# Patient Record
Sex: Female | Born: 1960
Health system: Southern US, Community
[De-identification: ages and names within clinical notes are randomized; demographics above are authoritative.]

## PROBLEM LIST (undated history)

## (undated) DIAGNOSIS — J309 Allergic rhinitis, unspecified: Secondary | ICD-10-CM

## (undated) DIAGNOSIS — L309 Dermatitis, unspecified: Secondary | ICD-10-CM

## (undated) DIAGNOSIS — K5900 Constipation, unspecified: Secondary | ICD-10-CM

## (undated) DIAGNOSIS — K829 Disease of gallbladder, unspecified: Secondary | ICD-10-CM

## (undated) DIAGNOSIS — M2550 Pain in unspecified joint: Secondary | ICD-10-CM

## (undated) DIAGNOSIS — R519 Headache, unspecified: Secondary | ICD-10-CM

## (undated) DIAGNOSIS — I10 Essential (primary) hypertension: Secondary | ICD-10-CM

## (undated) DIAGNOSIS — Z91018 Allergy to other foods: Secondary | ICD-10-CM

## (undated) DIAGNOSIS — E559 Vitamin D deficiency, unspecified: Secondary | ICD-10-CM

## (undated) DIAGNOSIS — R51 Headache: Secondary | ICD-10-CM

## (undated) HISTORY — DX: Pain in unspecified joint: M25.50

## (undated) HISTORY — DX: Disease of gallbladder, unspecified: K82.9

## (undated) HISTORY — DX: Vitamin D deficiency, unspecified: E55.9

## (undated) HISTORY — DX: Essential (primary) hypertension: I10

## (undated) HISTORY — DX: Headache, unspecified: R51.9

## (undated) HISTORY — DX: Allergic rhinitis, unspecified: J30.9

## (undated) HISTORY — DX: Dermatitis, unspecified: L30.9

## (undated) HISTORY — PX: BRAIN SURGERY: SHX531

## (undated) HISTORY — DX: Headache: R51

## (undated) HISTORY — DX: Allergy to other foods: Z91.018

## (undated) HISTORY — DX: Constipation, unspecified: K59.00

---

## 2003-12-17 ENCOUNTER — Other Ambulatory Visit: Admission: RE | Admit: 2003-12-17 | Discharge: 2003-12-17 | Payer: Self-pay | Admitting: Family Medicine

## 2004-02-11 ENCOUNTER — Emergency Department (HOSPITAL_COMMUNITY): Admission: EM | Admit: 2004-02-11 | Discharge: 2004-02-11 | Payer: Self-pay | Admitting: Emergency Medicine

## 2004-12-07 ENCOUNTER — Ambulatory Visit: Payer: Self-pay | Admitting: Family Medicine

## 2006-04-22 ENCOUNTER — Ambulatory Visit: Payer: Self-pay | Admitting: Family Medicine

## 2006-08-29 DIAGNOSIS — J45998 Other asthma: Secondary | ICD-10-CM | POA: Insufficient documentation

## 2006-08-29 DIAGNOSIS — L259 Unspecified contact dermatitis, unspecified cause: Secondary | ICD-10-CM | POA: Insufficient documentation

## 2006-09-02 ENCOUNTER — Ambulatory Visit: Payer: Self-pay | Admitting: Family Medicine

## 2006-09-02 DIAGNOSIS — M25519 Pain in unspecified shoulder: Secondary | ICD-10-CM | POA: Insufficient documentation

## 2006-09-02 DIAGNOSIS — M25539 Pain in unspecified wrist: Secondary | ICD-10-CM | POA: Insufficient documentation

## 2006-11-18 ENCOUNTER — Telehealth (INDEPENDENT_AMBULATORY_CARE_PROVIDER_SITE_OTHER): Payer: Self-pay | Admitting: *Deleted

## 2006-11-25 ENCOUNTER — Encounter: Admission: RE | Admit: 2006-11-25 | Discharge: 2006-11-25 | Payer: Self-pay | Admitting: Obstetrics and Gynecology

## 2007-12-26 ENCOUNTER — Telehealth: Payer: Self-pay | Admitting: Internal Medicine

## 2007-12-27 ENCOUNTER — Ambulatory Visit: Payer: Self-pay | Admitting: Family Medicine

## 2007-12-27 DIAGNOSIS — J301 Allergic rhinitis due to pollen: Secondary | ICD-10-CM | POA: Insufficient documentation

## 2008-01-01 ENCOUNTER — Telehealth: Payer: Self-pay | Admitting: Family Medicine

## 2008-01-01 LAB — CONVERTED CEMR LAB: IgE (Immunoglobulin E), Serum: 892.5 intl units/mL — ABNORMAL HIGH (ref 0.0–180.0)

## 2008-01-02 ENCOUNTER — Telehealth (INDEPENDENT_AMBULATORY_CARE_PROVIDER_SITE_OTHER): Payer: Self-pay | Admitting: *Deleted

## 2008-01-11 ENCOUNTER — Ambulatory Visit: Payer: Self-pay | Admitting: Family Medicine

## 2008-01-26 ENCOUNTER — Encounter (INDEPENDENT_AMBULATORY_CARE_PROVIDER_SITE_OTHER): Payer: Self-pay | Admitting: *Deleted

## 2008-03-18 ENCOUNTER — Ambulatory Visit: Payer: Self-pay | Admitting: Internal Medicine

## 2008-03-18 LAB — CONVERTED CEMR LAB
Basophils Absolute: 0.1 10*3/uL (ref 0.0–0.1)
Basophils Relative: 1.6 % (ref 0.0–3.0)
Eosinophils Absolute: 0.2 10*3/uL (ref 0.0–0.7)
Eosinophils Relative: 3.8 % (ref 0.0–5.0)
HCT: 39.6 % (ref 36.0–46.0)
Hemoglobin: 13.4 g/dL (ref 12.0–15.0)
Lymphocytes Relative: 22.3 % (ref 12.0–46.0)
MCHC: 33.8 g/dL (ref 30.0–36.0)
MCV: 86.9 fL (ref 78.0–100.0)
Monocytes Absolute: 0.5 10*3/uL (ref 0.1–1.0)
Monocytes Relative: 9.5 % (ref 3.0–12.0)
Neutro Abs: 3 10*3/uL (ref 1.4–7.7)
Neutrophils Relative %: 62.8 % (ref 43.0–77.0)
Platelets: 185 10*3/uL (ref 150–400)
RBC: 4.55 M/uL (ref 3.87–5.11)
RDW: 12.7 % (ref 11.5–14.6)
WBC: 4.9 10*3/uL (ref 4.5–10.5)

## 2008-03-20 LAB — CONVERTED CEMR LAB: IgE (Immunoglobulin E), Serum: 1045 intl units/mL — ABNORMAL HIGH (ref 0.0–180.0)

## 2008-04-22 ENCOUNTER — Ambulatory Visit: Payer: Self-pay | Admitting: Family Medicine

## 2008-04-22 DIAGNOSIS — L03317 Cellulitis of buttock: Secondary | ICD-10-CM | POA: Insufficient documentation

## 2008-04-22 DIAGNOSIS — L0231 Cutaneous abscess of buttock: Secondary | ICD-10-CM | POA: Insufficient documentation

## 2008-04-26 ENCOUNTER — Telehealth (INDEPENDENT_AMBULATORY_CARE_PROVIDER_SITE_OTHER): Payer: Self-pay | Admitting: *Deleted

## 2009-02-10 ENCOUNTER — Telehealth: Payer: Self-pay | Admitting: Family Medicine

## 2009-03-19 ENCOUNTER — Ambulatory Visit: Payer: Self-pay | Admitting: Family Medicine

## 2009-04-30 ENCOUNTER — Ambulatory Visit: Payer: Self-pay | Admitting: Family Medicine

## 2009-04-30 DIAGNOSIS — N951 Menopausal and female climacteric states: Secondary | ICD-10-CM | POA: Insufficient documentation

## 2009-04-30 DIAGNOSIS — J019 Acute sinusitis, unspecified: Secondary | ICD-10-CM | POA: Insufficient documentation

## 2009-04-30 LAB — CONVERTED CEMR LAB
Cholesterol, target level: 200 mg/dL
HDL goal, serum: 40 mg/dL
Inflenza A Ag: NEGATIVE
Influenza B Ag: NEGATIVE
LDL Goal: 160 mg/dL

## 2009-05-01 ENCOUNTER — Telehealth: Payer: Self-pay | Admitting: Family Medicine

## 2009-06-27 ENCOUNTER — Ambulatory Visit: Payer: Self-pay | Admitting: Family Medicine

## 2009-06-27 DIAGNOSIS — M549 Dorsalgia, unspecified: Secondary | ICD-10-CM | POA: Insufficient documentation

## 2009-08-11 ENCOUNTER — Ambulatory Visit: Payer: Self-pay | Admitting: Family Medicine

## 2010-04-29 NOTE — Assessment & Plan Note (Signed)
Summary: ? flu symptoms//fd   Vital Signs:  Patient profile:   50 year old female Weight:      206 pounds Temp:     99.1 degrees F oral Pulse rate:   88 / minute Pulse rhythm:   regular BP sitting:   128 / 80  (left arm) Cuff size:   large  Vitals Entered By: Army Fossa CMA (April 30, 2009 11:02 AM) CC: Pt states since saturday, no appetite, vomitted on sunday, congestion., Cough, Lipid Management   History of Present Illness:  Cough      This is a 50 year old woman who presents with Cough.  The symptoms began 5 days ago.  The patient reports productive cough and fever, but denies non-productive cough, pleuritic chest pain, shortness of breath, wheezing, exertional dyspnea, hemoptysis, and malaise.  Associated symtpoms include sore throat and nasal congestion.  The patient denies the following symptoms: cold/URI symptoms, chronic rhinitis, weight loss, acid reflux symptoms, and peripheral edema.  The cough is worse with lying down.  Ineffective prior treatments have included OTC cough medication.  Pt took otc theraflu with some relief.     Lipid Management History:      Negative NCEP/ATP III risk factors include female age less than 48 years old and non-tobacco-user status.    Current Medications (verified): 1)  Singulair 10 Mg Tabs (Montelukast Sodium) .... Take 1 Tablet By Mouth At Bedtime 2)  Elidel 1 % Crea (Pimecrolimus) .... Apply A Small Amount To Affected Area Twice A Day 3)  Proair Hfa 108 (90 Base) Mcg/act Aers (Albuterol Sulfate) .... Inhale 2 Puff As Directed Four Times A Day-Needs To Schedule Office Visit 4)  Dulera 100-5 Mcg/act Aero (Mometasone Furo-Formoterol Fum) .... 2 Puffs Two Times A Day 5)  Desowen Lot W/cetaphil Cream 0.05 % Kit (Desonide Lot-Moisturizing Crea) .... Apply To Affected Area Once Daily 6)  Augmentin 875-125 Mg Tabs (Amoxicillin-Pot Clavulanate) .Marland Kitchen.. 1 By Mouth Two Times A Day 7)  Cheratussin Ac 100-10 Mg/11ml Syrp (Guaifenesin-Codeine) .Marland Kitchen..  1-2 Tsp By Mouth At Bedtime  Allergies (verified): No Known Drug Allergies  Past History:  Past medical, surgical, family and social histories (including risk factors) reviewed for relevance to current acute and chronic problems.  Past Medical History: Reviewed history from 03/18/2008 and no changes required. Asthma Eczema Allergic rhinitis  Past Surgical History: Reviewed history from 03/18/2008 and no changes required. No ENT C-section  Family History: Reviewed history from 03/18/2008 and no changes required. Family History of Asthma, eczema, allergy  Social History: Reviewed history from 08/29/2006 and no changes required. Married Never Smoked Alcohol use-no  Review of Systems      See HPI  Physical Exam  General:  Well-developed,well-nourished,in no acute distress; alert,appropriate and cooperative throughout examination Nose:  no external deformity and L frontal sinus tenderness.   Mouth:  Oral mucosa and oropharynx without lesions or exudates.  Teeth in good repair. Neck:  cervical lymphadenopathy.   Lungs:  Normal respiratory effort, chest expands symmetrically. Lungs are clear to auscultation, no crackles or wheezes. Heart:  normal rate, regular rhythm, and no murmur.   Psych:  Oriented X3 and normally interactive.     Impression & Recommendations:  Problem # 1:  SINUSITIS - ACUTE-NOS (ICD-461.9)  Her updated medication list for this problem includes:    Augmentin 875-125 Mg Tabs (Amoxicillin-pot clavulanate) .Marland Kitchen... 1 by mouth two times a day    Cheratussin Ac 100-10 Mg/51ml Syrp (Guaifenesin-codeine) .Marland Kitchen... 1-2 tsp by mouth  at bedtime  Instructed on treatment. Call if symptoms persist or worsen.   Orders: Flu A+B (11914)  Problem # 2:  HOT FLASHES (ICD-627.2)  maybe secondary to #1 since it has been occuring same amount of time as above symptoms consider gyn ---pt wanted to but then decided to wait  Orders: Flu A+B (78295)  Complete Medication  List: 1)  Singulair 10 Mg Tabs (Montelukast sodium) .... Take 1 tablet by mouth at bedtime 2)  Elidel 1 % Crea (Pimecrolimus) .... Apply a small amount to affected area twice a day 3)  Proair Hfa 108 (90 Base) Mcg/act Aers (Albuterol sulfate) .... Inhale 2 puff as directed four times a day-needs to schedule office visit 4)  Dulera 100-5 Mcg/act Aero (Mometasone furo-formoterol fum) .... 2 puffs two times a day 5)  Desowen Lot W/cetaphil Cream 0.05 % Kit (Desonide lot-moisturizing crea) .... Apply to affected area once daily 6)  Augmentin 875-125 Mg Tabs (Amoxicillin-pot clavulanate) .Marland Kitchen.. 1 by mouth two times a day 7)  Cheratussin Ac 100-10 Mg/53ml Syrp (Guaifenesin-codeine) .Marland Kitchen.. 1-2 tsp by mouth at bedtime  Lipid Assessment/Plan:      Based on NCEP/ATP III, the patient's risk factor category is "0-1 risk factors".  The patient's lipid goals are as follows: Total cholesterol goal is 200; LDL cholesterol goal is 160; HDL cholesterol goal is 40; Triglyceride goal is 150.    Prescriptions: CHERATUSSIN AC 100-10 MG/5ML SYRP (GUAIFENESIN-CODEINE) 1-2 tsp by mouth at bedtime  #6 oz x 0   Entered and Authorized by:   Loreen Freud DO   Signed by:   Loreen Freud DO on 04/30/2009   Method used:   Print then Give to Patient   RxID:   [id number] AUGMENTIN 875-125 MG TABS (AMOXICILLIN-POT CLAVULANATE) 1 by mouth two times a day  #20 x 0   Entered and Authorized by:   Loreen Freud DO   Signed by:   Loreen Freud DO on 04/30/2009   Method used:   Electronically to        [facility] #[id number]* (retail)       [address]       [place], [place]  [address]       Ph: [phone]       Fax: [phone]   RxID:   [id number]   Laboratory Results    Other Tests  Influenza A: negative Influenza B: negative

## 2010-04-29 NOTE — Assessment & Plan Note (Signed)
Summary: pain in right hip, painful to walk//lch   Vital Signs:  Patient profile:   50 year old female Weight:      216 pounds Pulse rate:   90 / minute Pulse rhythm:   regular BP sitting:   122 / 80  (left arm) Cuff size:   large  Vitals Entered By: Army Fossa CMA (June 27, 2009 1:28 PM) CC: Pt here for a pain in her right hip that goes down to her ankle. Started monday., Back Pain   History of Present Illness:       This is a 50 year old woman who presents with Back Pain.  The symptoms began 1 week ago.  The patient denies fever, chills, weakness, loss of sensation, fecal incontinence, urinary incontinence, urinary retention, dysuria, rest pain, inability to work, and inability to care for self.  The pain is located in the right low back.  The pain began at home, gradually, and after overuse.  The pain radiates to the right leg below the knee.  The pain is made worse by standing or walking.  The pain is made better by inactivity.    Current Medications (verified): 1)  Singulair 10 Mg Tabs (Montelukast Sodium) .... Take 1 Tablet By Mouth At Bedtime 2)  Elidel 1 % Crea (Pimecrolimus) .... Apply A Small Amount To Affected Area Twice A Day 3)  Proair Hfa 108 (90 Base) Mcg/act Aers (Albuterol Sulfate) .... Inhale 2 Puff As Directed Four Times A Day-Needs To Schedule Office Visit 4)  Dulera 100-5 Mcg/act Aero (Mometasone Furo-Formoterol Fum) .... 2 Puffs Two Times A Day 5)  Desowen Lot W/cetaphil Cream 0.05 % Kit (Desonide Lot-Moisturizing Crea) .... Apply To Affected Area Once Daily 6)  Cheratussin Ac 100-10 Mg/20ml Syrp (Guaifenesin-Codeine) .Marland Kitchen.. 1-2 Tsp By Mouth At Bedtime 7)  Flexeril 10 Mg Tabs (Cyclobenzaprine Hcl) .Marland Kitchen.. 1 By Mouth Three Times A Day 8)  Mobic 15 Mg Tabs (Meloxicam) .... 1/2 - 1 By Mouth Once Daily As Needed  Allergies (verified): No Known Drug Allergies  Past History:  Past medical, surgical, family and social histories (including risk factors) reviewed for  relevance to current acute and chronic problems.  Past Medical History: Reviewed history from 03/18/2008 and no changes required. Asthma Eczema Allergic rhinitis  Past Surgical History: Reviewed history from 03/18/2008 and no changes required. No ENT C-section  Family History: Reviewed history from 03/18/2008 and no changes required. Family History of Asthma, eczema, allergy  Social History: Reviewed history from 08/29/2006 and no changes required. Married Never Smoked Alcohol use-no  Review of Systems      See HPI  Physical Exam  General:  Well-developed,well-nourished,in no acute distress; alert,appropriate and cooperative throughout examination Msk:  normal ROM and no redness over joints.   Extremities:  No clubbing, cyanosis, edema, or deformity noted with normal full range of motion of all joints.   Neurologic:  strength normal in all extremities, gait normal, and DTRs symmetrical and normal.   Psych:  Oriented X3 and normally interactive.     Impression & Recommendations:  Problem # 1:  BACK PAIN (ICD-724.5)  Her updated medication list for this problem includes:    Flexeril 10 Mg Tabs (Cyclobenzaprine hcl) .Marland Kitchen... 1 by mouth three times a day    Mobic 15 Mg Tabs (Meloxicam) .Marland Kitchen... 1/2 - 1 by mouth once daily as needed  Discussed use of moist heat or ice, modified activities, medications, and stretching/strengthening exercises. Back care instructions given. To be seen  in 2 weeks if no improvement; sooner if worsening of symptoms.   Complete Medication List: 1)  Singulair 10 Mg Tabs (Montelukast sodium) .... Take 1 tablet by mouth at bedtime 2)  Elidel 1 % Crea (Pimecrolimus) .... Apply a small amount to affected area twice a day 3)  Proair Hfa 108 (90 Base) Mcg/act Aers (Albuterol sulfate) .... Inhale 2 puff as directed four times a day-needs to schedule office visit 4)  Dulera 100-5 Mcg/act Aero (Mometasone furo-formoterol fum) .... 2 puffs two times a day 5)   Desowen Lot W/cetaphil Cream 0.05 % Kit (Desonide lot-moisturizing crea) .... Apply to affected area once daily 6)  Cheratussin Ac 100-10 Mg/28ml Syrp (Guaifenesin-codeine) .Marland Kitchen.. 1-2 tsp by mouth at bedtime 7)  Flexeril 10 Mg Tabs (Cyclobenzaprine hcl) .Marland Kitchen.. 1 by mouth three times a day 8)  Mobic 15 Mg Tabs (Meloxicam) .... 1/2 - 1 by mouth once daily as needed  Patient Instructions: 1)  rto 1-2 weeks if no better or sooner if worsens Prescriptions: DULERA 100-5 MCG/ACT AERO (MOMETASONE FURO-FORMOTEROL FUM) 2 puffs two times a day  #1 x 5   Entered and Authorized by:   Loreen Freud DO   Signed by:   Loreen Freud DO on 06/27/2009   Method used:   Electronically to        [facility] #[id number]* (retail)       [address]       [place], [place]  [address]       Ph: [phone]       Fax: [phone]   RxID:   [id number] MOBIC 15 MG TABS (MELOXICAM) 1/2 - 1 by mouth once daily as needed  #30 x 1   Entered and Authorized by:   Loreen Freud DO   Signed by:   Loreen Freud DO on 06/27/2009   Method used:   Electronically to        [facility] #[id number]* (retail)       [address]       [place], [place]  [address]       Ph: [phone]       Fax: [phone]   RxID:   [id number] FLEXERIL 10 MG TABS (CYCLOBENZAPRINE HCL) 1 by mouth three times a day  #30 x 0   Entered and Authorized by:   Loreen Freud DO   Signed by:   Loreen Freud DO on 06/27/2009   Method used:   Electronically to        [facility] #[id number]* (retail)       [address]       [place], [place]  [address]       Ph: [phone]       Fax: [phone]   RxID:   [id number]

## 2010-04-29 NOTE — Progress Notes (Signed)
Summary: refill  Phone Note Refill Request   Refills Requested: Medication #1:  DULERA 100-5 MCG/ACT AERO 2 puffs two times a day Pt stated that you told her you were going to send in to the pharmacy... is this fine to do? Army Fossa CMA  May 01, 2009 11:46 AM It's never been filled but it was entered by you in in december.    Follow-up for Phone Call        yes Follow-up by: Loreen Freud DO,  May 01, 2009 12:27 PM    Prescriptions: DULERA 100-5 MCG/ACT AERO (MOMETASONE FURO-FORMOTEROL FUM) 2 puffs two times a day  #1 x 0   Entered by:   Army Fossa CMA   Authorized by:   Loreen Freud DO   Signed by:   Army Fossa CMA on 05/01/2009   Method used:   Electronically to        [facility] #[id number]* (retail)       [address]       [place], [place]  [address]       Ph: [phone]       Fax: [phone]   RxID:   [id number]

## 2010-04-29 NOTE — Assessment & Plan Note (Signed)
Summary: congestion//lch   Vital Signs:  Patient profile:   50 year old female Weight:      215 pounds Temp:     99.5 degrees F oral Pulse rate:   88 / minute Pulse rhythm:   regular BP sitting:   130 / 90  (left arm) Cuff size:   large  Vitals Entered By: Army Fossa CMA (Aug 11, 2009 10:31 AM) CC: Pt here for head and chest congestion since saturday night. Has taken Zyrtec., URI symptoms   History of Present Illness:       This is a 50 year old woman who presents with URI symptoms.  The symptoms began 4 days ago.  Pt is taking zyrtec regularly with no relief.  The patient complains of nasal congestion, purulent nasal discharge, productive cough, and sick contacts, but denies earache.  Associated symptoms include low-grade fever (<100.5 degrees) and wheezing.  The patient denies fever, fever of 100.5-103 degrees, fever of 103.1-104 degrees, fever to >104 degrees, stiff neck, dyspnea, rash, vomiting, diarrhea, use of an antipyretic, and response to antipyretic.  The patient also reports sneezing and headache.  The patient denies itchy watery eyes, itchy throat, seasonal symptoms, response to antihistamine, muscle aches, and severe fatigue.  The patient denies the following risk factors for Strep sinusitis: unilateral facial pain, unilateral nasal discharge, poor response to decongestant, double sickening, tooth pain, Strep exposure, tender adenopathy, and absence of cough.    Current Medications (verified): 1)  Singulair 10 Mg Tabs (Montelukast Sodium) .... Take 1 Tablet By Mouth At Bedtime 2)  Elidel 1 % Crea (Pimecrolimus) .... Apply A Small Amount To Affected Area Twice A Day 3)  Proair Hfa 108 (90 Base) Mcg/act Aers (Albuterol Sulfate) .... Inhale 2 Puff As Directed Four Times A Day-Needs To Schedule Office Visit 4)  Dulera 100-5 Mcg/act Aero (Mometasone Furo-Formoterol Fum) .... 2 Puffs Two Times A Day 5)  Desowen Lot W/cetaphil Cream 0.05 % Kit (Desonide Lot-Moisturizing Crea)  .... Apply To Affected Area Once Daily 6)  Prednisone 10 Mg Tabs (Prednisone) .... 3 By Mouth Once Daily For 3 Days Then 2 By Mouth Once Daily For 3 Days Then 1 By Mouth Once Daily For 3 Days 7)  Augmentin 875-125 Mg Tabs (Amoxicillin-Pot Clavulanate) .Marland Kitchen.. 1 By Mouth Two Times A Day 8)  Fluconazole 150 Mg Tabs (Fluconazole) .Marland Kitchen.. 1 By Mouth Once Daily X1.  May Repeat in 3 Days As Needed  Allergies (verified): No Known Drug Allergies  Past History:  Past medical, surgical, family and social histories (including risk factors) reviewed for relevance to current acute and chronic problems.  Past Medical History: Reviewed history from 03/18/2008 and no changes required. Asthma Eczema Allergic rhinitis  Past Surgical History: Reviewed history from 03/18/2008 and no changes required. No ENT C-section  Family History: Reviewed history from 03/18/2008 and no changes required. Family History of Asthma, eczema, allergy  Social History: Reviewed history from 08/29/2006 and no changes required. Married Never Smoked Alcohol use-no  Review of Systems      See HPI  Physical Exam  General:  Well-developed,well-nourished,in no acute distress; alert,appropriate and cooperative throughout examination Ears:  External ear exam shows no significant lesions or deformities.  Otoscopic examination reveals clear canals, tympanic membranes are intact bilaterally without bulging, retraction, inflammation or discharge. Hearing is grossly normal bilaterally. Nose:  L frontal sinus tenderness, L maxillary sinus tenderness, R frontal sinus tenderness, and R maxillary sinus tenderness.   Mouth:  Oral mucosa and oropharynx without  lesions or exudates.  Teeth in good repair. Neck:  No deformities, masses, or tenderness noted. Lungs:  R decreased breath sounds and L decreased breath sounds.   Heart:  normal rate and no murmur.   Cervical Nodes:  No lymphadenopathy noted Psych:  Cognition and judgment appear  intact. Alert and cooperative with normal attention span and concentration. No apparent delusions, illusions, hallucinations   Impression & Recommendations:  Problem # 1:  SINUSITIS - ACUTE-NOS (ICD-461.9)  The following medications were removed from the medication list:    Cheratussin Ac 100-10 Mg/73ml Syrp (Guaifenesin-codeine) .Marland Kitchen... 1-2 tsp by mouth at bedtime Her updated medication list for this problem includes:    Augmentin 875-125 Mg Tabs (Amoxicillin-pot clavulanate) .Marland Kitchen... 1 by mouth two times a day  Instructed on treatment. Call if symptoms persist or worsen.   Problem # 2:  BRONCHITIS- ACUTE (ICD-466.0)  The following medications were removed from the medication list:    Cheratussin Ac 100-10 Mg/62ml Syrp (Guaifenesin-codeine) .Marland Kitchen... 1-2 tsp by mouth at bedtime Her updated medication list for this problem includes:    Singulair 10 Mg Tabs (Montelukast sodium) .Marland Kitchen... Take 1 tablet by mouth at bedtime    Proair Hfa 108 (90 Base) Mcg/act Aers (Albuterol sulfate) ..... Inhale 2 puff as directed four times a day-needs to schedule office visit    Dulera 100-5 Mcg/act Aero (Mometasone furo-formoterol fum) .Marland Kitchen... 2 puffs two times a day    Augmentin 875-125 Mg Tabs (Amoxicillin-pot clavulanate) .Marland Kitchen... 1 by mouth two times a day  Take antibiotics and other medications as directed. Encouraged to push clear liquids, get enough rest, and take acetaminophen as needed. To be seen in 5-7 days if no improvement, sooner if worse.  Orders: Depo- Medrol 80mg  (J1040) Admin of Therapeutic Inj  intramuscular or subcutaneous ([address])  Complete Medication List: 1)  Singulair 10 Mg Tabs (Montelukast sodium) .... Take 1 tablet by mouth at bedtime 2)  Elidel 1 % Crea (Pimecrolimus) .... Apply a small amount to affected area twice a day 3)  Proair Hfa 108 (90 Base) Mcg/act Aers (Albuterol sulfate) .... Inhale 2 puff as directed four times a day-needs to schedule office visit 4)  Dulera 100-5 Mcg/act Aero  (Mometasone furo-formoterol fum) .... 2 puffs two times a day 5)  Desowen Lot W/cetaphil Cream 0.05 % Kit (Desonide lot-moisturizing crea) .... Apply to affected area once daily 6)  Prednisone 10 Mg Tabs (Prednisone) .... 3 by mouth once daily for 3 days then 2 by mouth once daily for 3 days then 1 by mouth once daily for 3 days 7)  Augmentin 875-125 Mg Tabs (Amoxicillin-pot clavulanate) .Marland Kitchen.. 1 by mouth two times a day 8)  Fluconazole 150 Mg Tabs (Fluconazole) .Marland Kitchen.. 1 by mouth once daily x1.  may repeat in 3 days as needed Prescriptions: FLUCONAZOLE 150 MG TABS (FLUCONAZOLE) 1 by mouth once daily x1.  May repeat in 3 days as needed  #2 x 2   Entered and Authorized by:   Loreen Freud DO   Signed by:   Loreen Freud DO on 08/11/2009   Method used:   Electronically to        [facility] #[id number]* (retail)       [address]       [place], [place]  [address]       Ph: [phone]       Fax: [phone]   RxID:   [id number] AUGMENTIN [id number]  MG TABS (AMOXICILLIN-POT CLAVULANATE) 1 by mouth two times a day  #20 x 0   Entered and Authorized by:   Loreen Freud DO   Signed by:   Loreen Freud DO on 08/11/2009   Method used:   Electronically to        [facility] [phone]* (retail)       [address]       [place], [place]  [address]       Ph: [phone]       Fax: [phone]   RxID:   [id number] PREDNISONE 10 MG TABS (PREDNISONE) 3 by mouth once daily for 3 days then 2 by mouth once daily for 3 days then 1 by mouth once daily for 3 days  #18 x 0   Entered and Authorized by:   Loreen Freud DO   Signed by:   Loreen Freud DO on 08/11/2009   Method used:   Electronically to        [facility] #[id number]* (retail)       [address]       [place], [place]  [address]       Ph: [phone]       Fax: [phone]   RxID:   [id number]    Medication  Administration  Injection # 1:    Medication: Depo- Medrol 80mg     Diagnosis: BRONCHITIS- ACUTE (ICD-466.0)    Route: IM    Site: LUOQ gluteus    Exp Date: 02/2010    Lot #: [id number]    Mfr: novaplus    Patient tolerated injection without complications    Given by: Army Fossa CMA (Aug 11, 2009 10:57 AM)  Orders Added: 1)  Est. Patient Level III [55732] 2)  Depo- Medrol 80mg  [J1040] 3)  Admin of Therapeutic Inj  intramuscular or subcutaneous [[address]254]

## 2010-05-25 ENCOUNTER — Telehealth: Payer: Self-pay | Admitting: Family Medicine

## 2010-06-02 ENCOUNTER — Telehealth: Payer: Self-pay | Admitting: Family Medicine

## 2010-06-04 NOTE — Progress Notes (Signed)
Summary: MD info needed  Phone Note Call from Patient Call back at Work Phone [phone]   Summary of Call: Patient left message on triage requesting the name/info of allergist Dr. Laury Axon referred her to.   I see that the patient was referred to Dr. Maple Hudson. I left message on vm notifying the patient and giving MD location and ph #. Lucious Groves CMA  May 25, 2010 12:25 PM

## 2010-06-09 NOTE — Progress Notes (Signed)
Summary: referral  Phone Note Refill Request   Refills Requested: Medication #1:  SINGULAIR 10 MG TABS Take 1 tablet by mouth at bedtime  Medication #2:  zyrtec Pt c/o headache,runny nose, sneezing and some head congestion. Pt states that med is not working for allergy. Pt would like to be referred to allergist. Pls advise..........Marland KitchenFelecia Deloach CMA  June 02, 2010 10:05 AM    Follow-up for Phone Call        referral put in Follow-up by: Loreen Freud DO,  June 02, 2010 12:00 PM  Additional Follow-up for Phone Call Additional follow up Details #1::        pt aware... Almeta Monas CMA Duncan Dull)  June 02, 2010 12:02 PM

## 2010-06-29 ENCOUNTER — Ambulatory Visit: Payer: Self-pay | Admitting: Family Medicine

## 2010-06-29 ENCOUNTER — Encounter: Payer: Self-pay | Admitting: Family

## 2010-06-29 ENCOUNTER — Ambulatory Visit (INDEPENDENT_AMBULATORY_CARE_PROVIDER_SITE_OTHER): Payer: Managed Care, Other (non HMO) | Admitting: Family

## 2010-06-29 DIAGNOSIS — J301 Allergic rhinitis due to pollen: Secondary | ICD-10-CM

## 2010-06-29 MED ORDER — FLUTICASONE FUROATE 27.5 MCG/SPRAY NA SUSP: 2.0000 | Freq: Every day | NASAL | Status: DC

## 2010-06-29 MED ORDER — MONTELUKAST SODIUM 10 MG PO TABS
10.0000 mg | ORAL_TABLET | Freq: Every day | ORAL | Status: DC
Start: 2010-06-29 — End: 2011-02-03

## 2010-06-29 NOTE — Patient Instructions (Addendum)
Please call if your symptoms worsen, or if they are not improved in 1 week.

## 2010-06-29 NOTE — Assessment & Plan Note (Signed)
50 year old female presents with symptoms of Allergic rhinitis and Asthma.  Will add Singulair to her regimen as well as a nasal steroid.  Cont. Antihistamine.

## 2010-06-29 NOTE — Progress Notes (Signed)
  Subjective:    Patient ID: Ruth [name], female    DOB: 11-05-1960, 50 y.o.   MRN: [id number]  HPI  Ms.  Ruth [name] is a 50 year old female who presents today with chief complaint of Nasal congestion, cough,  and phlegm (yellow).  Nasal congestion started Friday, cough started Saturday.  Clear nasal congestion.  + hx of asthma.  Yesterday felt some asthma symptoms.  Needed to use her rescue inhaler.  She is using zyrtec and Dulera currently.    Review of Systems  Constitutional: Negative for fever.  HENT:       Ears itch  Eyes:       Eyes are itching  Respiratory: Positive for wheezing.   Cardiovascular: Negative for chest pain and leg swelling.   No Known Allergies   BP 124/86  Pulse 84  Temp(Src) 98.5 F (36.9 C) (Oral)  Resp 16  Ht 5\' 5"  (1.651 m)  Wt 206 lb 1.3 oz (93.477 kg)  BMI 34.29 kg/m2        Objective:   Physical Exam  Constitutional: She appears well-developed and well-nourished.  HENT:  Left Ear: External ear normal.  Mouth/Throat: Oropharynx is clear and moist.       Ears: Bilateral serous effusion without significant erythema No maxillary or frontal sinus tenderness.  Eyes: Pupils are equal, round, and reactive to light.  Neck: Normal range of motion. Neck supple.  Cardiovascular: Normal rate.   Pulmonary/Chest: Breath sounds normal.  Lymphadenopathy:    She has no cervical adenopathy.          Assessment & Plan:

## 2010-07-01 ENCOUNTER — Telehealth: Payer: Self-pay | Admitting: *Deleted

## 2010-07-01 MED ORDER — BENZONATATE 100 MG PO CAPS: 100.0000 mg | ORAL_CAPSULE | Freq: Four times a day (QID) | ORAL | Status: DC | PRN

## 2010-07-01 MED ORDER — AMOXICILLIN 875 MG PO TABS: 875.0000 mg | ORAL_TABLET | Freq: Two times a day (BID) | ORAL | Status: AC

## 2010-07-01 NOTE — Telephone Encounter (Signed)
Rx sent to pharmacy for amoxicillin.  She should call if symptoms are not improved in the next 24 hours.  Also sent rx for tessalon for her cough.

## 2010-07-01 NOTE — Telephone Encounter (Signed)
Pt states cough is now dry but all persists all day. Still has other symptoms of head congestion. Was not able to use Veramyst yet due to problem at pharmacy. Has taken Singulair since Monday. Pt thinks the coughing is going to trigger an asthma attack and would like an antibiotic. Please advise.

## 2010-07-01 NOTE — Telephone Encounter (Signed)
Pt left voice message stating that she has been using Singulair and nasal spray but her cough has gotten much worse. Pt states she had to take an antibiotic the last time she felt this way. Pt states she has to report to work at 12pm.  Attempted to reach pt but received voicemail. Left message for pt to return my call. Please advise.

## 2010-07-01 NOTE — Telephone Encounter (Signed)
Left detailed message on pt's cell phone that rxs were sent to the pharmacy and to call if she has any questions.

## 2010-07-10 ENCOUNTER — Encounter: Payer: Self-pay | Admitting: Internal Medicine

## 2010-07-13 ENCOUNTER — Other Ambulatory Visit: Payer: Self-pay | Admitting: Internal Medicine

## 2010-07-13 ENCOUNTER — Other Ambulatory Visit: Payer: Managed Care, Other (non HMO)

## 2010-07-13 ENCOUNTER — Ambulatory Visit (INDEPENDENT_AMBULATORY_CARE_PROVIDER_SITE_OTHER): Payer: Managed Care, Other (non HMO) | Admitting: Internal Medicine

## 2010-07-13 ENCOUNTER — Encounter: Payer: Self-pay | Admitting: Internal Medicine

## 2010-07-13 VITALS — BP 122/80 | HR 92 | Ht 64.0 in | Wt 211.4 lb

## 2010-07-13 DIAGNOSIS — J301 Allergic rhinitis due to pollen: Secondary | ICD-10-CM

## 2010-07-13 DIAGNOSIS — J45909 Unspecified asthma, uncomplicated: Secondary | ICD-10-CM

## 2010-07-13 MED ORDER — FEXOFENADINE-PSEUDOEPHED ER 180-240 MG PO TB24: 1.0000 | ORAL_TABLET | Freq: Every day | ORAL | Status: DC

## 2010-07-13 NOTE — Patient Instructions (Signed)
Instead of zyrtec, ask at pharmacy counter for fexofenadine (generic Allegra)- D, 24 hour/ 180 mg. Try taking 1 daily. You can continue the Singulair and you Veramyst and Dulera.    Lab- Allergy profile dx Allergic rhinitis

## 2010-07-13 NOTE — Progress Notes (Signed)
  Subjective:    Patient ID: Ruth [name], female    DOB: 1960-09-18, 50 y.o.   MRN: [id number]  HPI 67 yoF last seen here 03/18/08 for allergy/ asthma consultation for Dr Laury Axon. Note is reviewed. Allergy vaccine helped 8 years ago in [place]. She has not been in control, with itching eyes, head and chest congested. Perennial symptoms. She flared 2 weeks ago without clear change in exposure. 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 She was given singulair, tessalon, Veramyst, augmentin, no prednisone.  Uses Dulera 100 regularly. Still itch and sneeze, itching eyes. Less cough.   Review of Systems  Constitutional: Negative for fever, chills, diaphoresis and fatigue.  HENT: Positive for congestion, rhinorrhea, sneezing and postnasal drip. Negative for ear pain and ear discharge.   Eyes: Positive for discharge and itching.  Respiratory: Positive for chest tightness and wheezing.   Neurological: Negative for headaches.   See HPI     Objective:   Physical Exam General- Alert, Oriented, Affect-appropriate, Distress- none acute  Skin- rash-none, lesions- none, excoriation- none  Lymphadenopathy- none  Head- atraumatic  Eyes- Gross vision intact, PERRLA, conjunctivae clear secretions  Ears- Hearing Normal canals, Tm L ,   R ,  Nose- A little hoarse and nasal. , Septal dev to left , No-  mucus, polyps, erosion, perforation   Throat- Mallampati II , mucosa clear , drainage- none, tonsils- atrophic  Neck- flexible , trachea midline, no stridor , thyroid nl, carotid no bruit  Chest - symmetrical excursion , unlabored     Heart/CV- RRR , no murmur , no gallop  , no rub, nl s1 s2                     - JVD- none , edema- none, stasis changes- none, varices- none     Lung- clear to P&A, wheeze- none, cough- none , dullness-none, rub- none     Chest wall-   Abd- tender-no, distended-no, bowel sounds-present, HSM- no  Br/ Gen/ Rectal- Not done,  not indicated  Extrem- cyanosis- none, clubbing, none, atrophy- none, strength- nl  Neuro- grossly intact to observation         Assessment & Plan:

## 2010-07-13 NOTE — Assessment & Plan Note (Signed)
She continues the script Veramyst, but still feels nasal with some post nasal drip. She doubts that zyrtec is doing much.  We will continue Veramyst and switch to allegra -d Discussed allergic conjunctivitis vs overdrying.

## 2010-07-13 NOTE — Assessment & Plan Note (Addendum)
Asthma for now seems ok with Dulera 10. On return we will look again at that, and consider a rescue inhaler.

## 2010-07-14 LAB — ALLERGY FULL PROFILE
Allergen, D pternoyssinus,d7: 0.94 kU/L — ABNORMAL HIGH (ref <0.35)
Allergen,Goose feathers, e70: <0.35 kU/L (ref <0.35)
Alternaria Alternata: 1.23 kU/L — ABNORMAL HIGH (ref <0.35)
Aspergillus fumigatus, IgG: 2.92 kU/L — ABNORMAL HIGH (ref <0.35)
Bahia Grass: 4.08 kU/L — ABNORMAL HIGH (ref <0.35)
Bermuda Grass: 2 kU/L — ABNORMAL HIGH (ref <0.35)
Box Elder IgE: 2.87 kU/L — ABNORMAL HIGH (ref <0.35)
Candida Albicans: 1.32 kU/L — ABNORMAL HIGH (ref <0.35)
Cat Dander: 33.8 kU/L — ABNORMAL HIGH (ref <0.35)
Common Ragweed: 2.88 kU/L — ABNORMAL HIGH (ref <0.35)
Curvularia lunata: 0.89 kU/L — ABNORMAL HIGH (ref <0.35)
D. farinae: 0.47 kU/L — ABNORMAL HIGH (ref <0.35)
Dog Dander: 41.5 kU/L — ABNORMAL HIGH (ref <0.35)
Elm IgE: 1.38 kU/L — ABNORMAL HIGH (ref <0.35)
Fescue: 6.02 kU/L — ABNORMAL HIGH (ref <0.35)
G005 Rye, Perennial: 3.29 kU/L — ABNORMAL HIGH (ref <0.35)
G009 Red Top: 6.57 kU/L — ABNORMAL HIGH (ref <0.35)
Goldenrod: 1.2 kU/L — ABNORMAL HIGH (ref <0.35)
Helminthosporium halodes: 2.68 kU/L — ABNORMAL HIGH (ref <0.35)
House Dust Hollister: 22.3 kU/L — ABNORMAL HIGH (ref <0.35)
IgE (Immunoglobulin E), Serum: 768.9 IU/mL — ABNORMAL HIGH (ref 0.0–180.0)
Lamb's Quarters: 1.14 kU/L — ABNORMAL HIGH (ref <0.35)
Oak: 32.6 kU/L — ABNORMAL HIGH (ref <0.35)
Plantain: 1.9 kU/L — ABNORMAL HIGH (ref <0.35)
Stemphylium Botryosum: 1.38 kU/L — ABNORMAL HIGH (ref <0.35)
Sycamore Tree: 1.16 kU/L — ABNORMAL HIGH (ref <0.35)
Timothy Grass: 3.37 kU/L — ABNORMAL HIGH (ref <0.35)

## 2010-07-18 ENCOUNTER — Encounter: Payer: Self-pay | Admitting: Internal Medicine

## 2010-08-11 ENCOUNTER — Ambulatory Visit: Payer: Managed Care, Other (non HMO) | Admitting: Internal Medicine

## 2010-08-31 NOTE — Progress Notes (Signed)
Quick Note:  Spoke with patient-aware of results. ______ 

## 2010-09-11 ENCOUNTER — Encounter: Payer: Self-pay | Admitting: Internal Medicine

## 2010-09-11 ENCOUNTER — Ambulatory Visit (INDEPENDENT_AMBULATORY_CARE_PROVIDER_SITE_OTHER): Payer: Managed Care, Other (non HMO) | Admitting: Internal Medicine

## 2010-09-11 VITALS — BP 122/78 | HR 97 | Ht 64.0 in | Wt 209.0 lb

## 2010-09-11 DIAGNOSIS — J301 Allergic rhinitis due to pollen: Secondary | ICD-10-CM

## 2010-09-11 MED ORDER — MOMETASONE FURO-FORMOTEROL FUM 100-5 MCG/ACT IN AERO: 2.0000 | INHALATION_SPRAY | Freq: Two times a day (BID) | RESPIRATORY_TRACT | Status: DC

## 2010-09-11 NOTE — Progress Notes (Signed)
Subjective:    Patient ID: Ruth [name], female    DOB: 1960/06/16, 50 y.o.   MRN: [id number]  HPI Subjective:    Patient ID: Ruth [name], female    DOB: 02/02/1961, 50 y.o.   MRN: [id number]  HPI 4/161/2- 89 yoF last seen here 03/18/08 for allergy/ asthma consultation for Dr Laury Axon. Note is reviewed. Allergy vaccine helped 8 years ago in [place]. She has not been in control, with itching eyes, head and chest congested. Perennial symptoms. She flared 2 weeks ago without clear change in exposure. 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 She was given singulair, tessalon, Veramyst, augmentin, no prednisone.  Uses Dulera 100 regularly. Still itch and sneeze, itching eyes. Less cough.  09/11/10-  Allergic conjunctivitis/ rhinitis, Asthma  Eyes always itch, still some hoarse, nasal drainage. Denies chest tightness or wheeze taking daily Dulera. Doing better now that Spring pollens have subsided. Taking Veramyst, no eye drops, taking Singulair- not helpful.  Allergy profile - 07/13/10- IgE 768.9, broadly positive Prednisone   Review of Systems  See HPI Constitutional: Negative for fever, chills, diaphoresis and fatigue.  HENT: Positive for congestion, rhinorrhea, sneezing and postnasal drip. Negative for ear pain and ear discharge.   Eyes: Positive for discharge and itching.  Respiratory: Positive for chest tightness and wheezing.   Neurological: Negative for headaches.    Objective:   Physical Exam General- Alert, Oriented, Affect-appropriate, Distress- none acute  Skin- rash-none, lesions- none, excoriation- none  Lymphadenopathy- none  Head- atraumatic  Eyes- Gross vision intact, PERRLA, conjunctivae clear secretions  Ears- Hearing Normal canals, Tm- normal  Nose- A little hoarse and nasal. , Septal dev to left , No-  mucus, polyps, erosion, perforation   Throat- Mallampati II , mucosa clear , drainage- none, tonsils-  atrophic  Neck- flexible , trachea midline, no stridor , thyroid nl, carotid no bruit  Chest - symmetrical excursion , unlabored     Heart/CV- RRR , no murmur , no gallop  , no rub, nl s1 s2                     - JVD- none , edema- none, stasis changes- none, varices- none     Lung- clear to P&A, wheeze- none, cough- none , dullness-none, rub- none     Chest wall-   Abd- tender-no, distended-no, bowel sounds-present, HSM- no  Br/ Gen/ Rectal- Not done, not indicated  Extrem- cyanosis- none, clubbing, none, atrophy- none, strength- nl  Neuro- grossly intact to observation         Assessment & Plan:     Review of Systems     Objective:   Physical Exam General- Alert, Oriented, Affect-appropriate, Distress- none acute  Skin- rash-none, lesions- none, excoriation- none  Lymphadenopathy- none  Head- atraumatic  Eyes- Gross vision intact, PERRLA, conjunctivae clear ecretions  Ears- Hearing, canals, Tm - normal  Nose- Clear, No- Septal dev, mucus, polyps, erosion, perforation   Throat- Mallampati II , mucosa clear , drainage- none, tonsils- atrophic   Minor hoarseness  Neck- flexible , trachea midline, no stridor , thyroid nl, carotid no bruit  Chest - symmetrical excursion , unlabored     Heart/CV- RRR , no murmur , no gallop  , no rub, nl s1 s2                     - JVD- none , edema- none, stasis changes-  none, varices- none     Lung- clear to P&A, wheeze- none, cough- none , dullness-none, rub- none     Chest wall-  Abd- tender-no, distended-no, bowel sounds-present, HSM- no  Br/ Gen/ Rectal- Not done, not indicated  Extrem- cyanosis- none, clubbing, none, atrophy- none, strength- nl  Neuro- grossly intact to observation         Assessment & Plan:

## 2010-09-11 NOTE — Assessment & Plan Note (Addendum)
Continues allergy control measures, already in place for her daughter. We compared Xolair to Allergy vaccine with potential risks, benefits, cost. Allergy vaccine worked well in past and asthma is currently controlled so we will start with allergy vaccine.

## 2010-09-11 NOTE — Patient Instructions (Signed)
Set up return for allergy skin testing-    It is important that you be off antihistamines including your Allegra, any otc cough, sleep or allergy meds, for 3 days before your testing. Dulera, singulair and Veramyst are ok.   Script for [facility] sent

## 2010-12-02 ENCOUNTER — Ambulatory Visit: Payer: Managed Care, Other (non HMO) | Admitting: Internal Medicine

## 2011-01-20 ENCOUNTER — Other Ambulatory Visit: Payer: Self-pay | Admitting: Family Medicine

## 2011-01-20 NOTE — Telephone Encounter (Signed)
Refill request--Last seen 08/11/09 ans filled 03/19/09. Please advise    KP

## 2011-01-21 ENCOUNTER — Ambulatory Visit (INDEPENDENT_AMBULATORY_CARE_PROVIDER_SITE_OTHER): Payer: Managed Care, Other (non HMO) | Admitting: Family Medicine

## 2011-01-21 ENCOUNTER — Encounter: Payer: Self-pay | Admitting: Family Medicine

## 2011-01-21 VITALS — BP 130/90 | HR 66 | Temp 98.6°F | Wt 208.0 lb

## 2011-01-21 DIAGNOSIS — J309 Allergic rhinitis, unspecified: Secondary | ICD-10-CM

## 2011-01-21 DIAGNOSIS — Z9109 Other allergy status, other than to drugs and biological substances: Secondary | ICD-10-CM

## 2011-01-21 MED ORDER — DESONIDE CREA-MOISTURIZING LOT 0.05 % EX KIT: 1.0000 "application " | PACK | Freq: Every day | CUTANEOUS | Status: DC

## 2011-01-21 MED ORDER — CICLESONIDE 37 MCG/ACT NA AERS: 1.0000 | INHALATION_SPRAY | Freq: Every day | NASAL | Status: DC

## 2011-01-21 MED ORDER — NAPHAZOLINE HCL 0.012 % OP SOLN: 1.0000 [drp] | Freq: Four times a day (QID) | OPHTHALMIC | Status: AC

## 2011-01-21 NOTE — Patient Instructions (Signed)
Allergies, Generic Allergies may happen from anything your body is sensitive to. This may be food, medicines, pollens, chemicals, and nearly anything around you in everyday life that produces allergens. An allergen is anything that causes an allergy producing substance. Heredity is often a factor in causing these problems. This means you may have some of the same allergies as your parents. Food allergies happen in all age groups. Food allergies are some of the most severe and life threatening. Some common food allergies are cow's milk, seafood, eggs, nuts, wheat, and soybeans. SYMPTOMS   Swelling around the mouth.   An itchy red rash or hives.   Vomiting or diarrhea.   Difficulty breathing.  SEVERE ALLERGIC REACTIONS ARE LIFE-THREATENING. This reaction is called anaphylaxis. It can cause the mouth and throat to swell and cause difficulty with breathing and swallowing. In severe reactions only a trace amount of food (for example, peanut oil in a salad) may cause death within seconds. Seasonal allergies occur in all age groups. These are seasonal because they usually occur during the same season every year. They may be a reaction to molds, grass pollens, or tree pollens. Other causes of problems are house dust mite allergens, pet dander, and mold spores. The symptoms often consist of nasal congestion, a runny itchy nose associated with sneezing, and tearing itchy eyes. There is often an associated itching of the mouth and ears. The problems happen when you come in contact with pollens and other allergens. Allergens are the particles in the air that the body reacts to with an allergic reaction. This causes you to release allergic antibodies. Through a chain of events, these eventually cause you to release histamine into the blood stream. Although it is meant to be protective to the body, it is this release that causes your discomfort. This is why you were given anti-histamines to feel better. If you are  unable to pinpoint the offending allergen, it may be determined by skin or blood testing. Allergies cannot be cured but can be controlled with medicine. Hay fever is a collection of all or some of the seasonal allergy problems. It may often be treated with simple over-the-counter medicine such as diphenhydramine. Take medicine as directed. Do not drink alcohol or drive while taking this medicine. Check with your caregiver or package insert for child dosages. If these medicines are not effective, there are many new medicines your caregiver can prescribe. Stronger medicine such as nasal spray, eye drops, and corticosteroids may be used if the first things you try do not work well. Other treatments such as immunotherapy or desensitizing injections can be used if all else fails. Follow up with your caregiver if problems continue. These seasonal allergies are usually not life threatening. They are generally more of a nuisance that can often be handled using medicine. HOME CARE INSTRUCTIONS   If unsure what causes a reaction, keep a diary of foods eaten and symptoms that follow. Avoid foods that cause reactions.   If hives or rash are present:   Take medicine as directed.   You may use an over-the-counter antihistamine (diphenhydramine) for hives and itching as needed.   Apply cold compresses (cloths) to the skin or take baths in cool water. Avoid hot baths or showers. Heat will make a rash and itching worse.   If you are severely allergic:   Following a treatment for a severe reaction, hospitalization is often required for closer follow-up.   Wear a medic-alert bracelet or necklace stating the allergy.     You and your family must learn how to give adrenaline or use an anaphylaxis kit.   If you have had a severe reaction, always carry your anaphylaxis kit or EpiPen with you. Use this medicine as directed by your caregiver if a severe reaction is occurring. Failure to do so could have a fatal  outcome.  SEEK MEDICAL CARE IF:  You suspect a food allergy. Symptoms generally happen within 30 minutes of eating a food.   Your symptoms have not gone away within 2 days or are getting worse.   You develop new symptoms.   You want to retest yourself or your child with a food or drink you think causes an allergic reaction. Never do this if an anaphylactic reaction to that food or drink has happened before. Only do this under the care of a caregiver.  SEEK IMMEDIATE MEDICAL CARE IF:   You have difficulty breathing, are wheezing, or have a tight feeling in your chest or throat.   You have a swollen mouth, or you have hives, swelling, or itching all over your body.   You have had a severe reaction that has responded to your anaphylaxis kit or an EpiPen. These reactions may return when the medicine has worn off. These reactions should be considered life threatening.  MAKE SURE YOU:   Understand these instructions.   Will watch your condition.   Will get help right away if you are not doing well or get worse.  Document Released: 06/08/2002 Document Revised: 11/25/2010 Document Reviewed: 11/13/2007 [employer] Patient Information 2012 [employer]. 

## 2011-01-21 NOTE — Progress Notes (Signed)
  Subjective:    Patient ID: Ruth [name], female    DOB: [birth year], 50 y.o.   MRN: [id number]  HPI Pt here c/o b/l itchy eyes and runny nose.  She is taking her meds.  Pt sees allergist and has appointment in the next couple of weeks.     Review of Systems As above    Objective:   Physical Exam  Constitutional: She is oriented to person, place, and time. She appears well-developed and well-nourished.  HENT:  Right Ear: External ear normal.  Left Ear: External ear normal.  Mouth/Throat: No oropharyngeal exudate.       + clear drainage from nose  Eyes:       Eyes tearing and injected b/l   Pulmonary/Chest: Effort normal and breath sounds normal.  Neurological: She is alert and oriented to person, place, and time.  Psychiatric: She has a normal mood and affect. Her behavior is normal. Judgment and thought content normal.          Assessment & Plan:  Allergies--- with allergic conjunctivitis                         zetonna nasal spray                          Naphcon eye drops

## 2011-02-03 ENCOUNTER — Ambulatory Visit (INDEPENDENT_AMBULATORY_CARE_PROVIDER_SITE_OTHER): Payer: Managed Care, Other (non HMO) | Admitting: Internal Medicine

## 2011-02-03 ENCOUNTER — Encounter: Payer: Self-pay | Admitting: Internal Medicine

## 2011-02-03 VITALS — BP 140/92 | HR 72 | Ht 64.0 in | Wt 210.6 lb

## 2011-02-03 DIAGNOSIS — J301 Allergic rhinitis due to pollen: Secondary | ICD-10-CM

## 2011-02-03 NOTE — Patient Instructions (Addendum)
After allergy test- given claritin, neb neo, depo 80 -----------------------------------------------------------  Ok to resume zyrtec  Sample Omnaris - 2 sprays each nostril, once daily at bedtime  Consider allergy vaccine- we can discuss it more next time  Sample Patanol eye drops- 1 drop each eye daily for allergy

## 2011-02-03 NOTE — Progress Notes (Signed)
   Patient ID: Ruth [name], female    DOB: September 06, 1960, 50 y.o.   MRN: [id number]  HPI 4/161/2- 24 yoF last seen here 03/18/08 for allergy/ asthma consultation for Dr Laury Axon. Note is reviewed. Allergy vaccine helped 8 years ago in [place]. She has not been in control, with itching eyes, head and chest congested. Perennial symptoms. She flared 2 weeks ago without clear change in exposure. 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 She was given singulair, tessalon, Veramyst, augmentin, no prednisone.  Uses Dulera 100 regularly. Still itch and sneeze, itching eyes. Less cough.  09/11/10-  Allergic conjunctivitis/ rhinitis, Asthma  Eyes always itch, still some hoarse, nasal drainage. Denies chest tightness or wheeze taking daily Dulera. Doing better now that Spring pollens have subsided. Taking Veramyst, no eye drops, taking Singulair- not helpful.  Allergy profile - 07/13/10- IgE 768.9, broadly positive Prednisone   02/03/11- 50 year old female never smoker followed for Allergic conjunctivitis/ rhinitis, Asthma  She comes today off of antihistamines for allergy skin testing. Describes it increased nasal congestion sneezing itching and burning of eyes since she has been unable to take her Zyrtec pending these tests. Naphcon did not help. Zetonna nasal spray burns too much.  Allergy Skin tests: Punctures only- significantly positive for grasses, trees and endodermals.    Review of Systems  See HPI Constitutional: Negative for fever, chills, diaphoresis and fatigue.  HENT: Positive for congestion, rhinorrhea, sneezing and postnasal drip. Negative for ear pain and ear discharge.   Eyes: Positive for discharge and itching.  Respiratory: Positive for chest tightness and wheezing.   Neurological: Negative for headaches.    Objective:  General- Alert, Oriented, Affect-appropriate, Distress- none acute Skin- rash-none, lesions- none, excoriation-  none Lymphadenopathy- none Head- atraumatic            Eyes- Gross vision intact, PERRLA, conjunctivae mildly injected            Ears- Hearing, canals-normal            Nose- Clear, no-Septal dev, mucus, polyps, erosion, perforation, +actively sneezing with obviously stuffy nose             Throat- Mallampati II , mucosa clear , drainage- none, tonsils- atrophic Neck-  Chest -            Heart/CV- RRR , no murmur , no gallop  , no rub, nl s1 s2                           - JVD- none , edema- none, stasis changes- none, varices- none           Lung- clear to P&A, wheeze- none, cough- none , dullness-none, rub- none           Chest wall-  Abd- Br/ Gen/ Rectal- Not done, not indicated Extrem-  Neuro- grossly intact to observation

## 2011-02-05 NOTE — Assessment & Plan Note (Addendum)
We discussed the significance of her positive skin test. Reactions were strong enough that we did not do intradermal testing. She was obviously symptomatic before we started today with sneezing and itching of eyes. She has not had adequate control with any of several different steroid nasal sprays and antihistamines. She had done better with allergy vaccine before moving to [facility] and we discussed that as an option. Plan: She is going to return to regular use of her Zyrtec antihistamine and I am giving a sample of Pataday eyedrops. We can watch her response to these, recognizing the winter season is coming with less troublesome exposure anticipated. We have discussed environmental precautions. After her testing, because of her persistent nasal symptoms, she was given a nasal nebulizer inhalation with Neo-Synephrine, and Depo-Medrol 80 mg IM with steroid talk.

## 2011-02-11 ENCOUNTER — Encounter: Payer: Self-pay | Admitting: Internal Medicine

## 2011-03-10 ENCOUNTER — Ambulatory Visit: Payer: Managed Care, Other (non HMO) | Admitting: Internal Medicine

## 2011-03-12 ENCOUNTER — Encounter: Payer: Self-pay | Admitting: Internal Medicine

## 2011-03-12 ENCOUNTER — Ambulatory Visit (INDEPENDENT_AMBULATORY_CARE_PROVIDER_SITE_OTHER): Payer: Managed Care, Other (non HMO) | Admitting: Internal Medicine

## 2011-03-12 VITALS — BP 110/82 | HR 87 | Ht 64.0 in | Wt 213.4 lb

## 2011-03-12 DIAGNOSIS — J45909 Unspecified asthma, uncomplicated: Secondary | ICD-10-CM

## 2011-03-12 DIAGNOSIS — J301 Allergic rhinitis due to pollen: Secondary | ICD-10-CM

## 2011-03-12 MED ORDER — AEROCHAMBER MV MISC: Status: AC

## 2011-03-12 NOTE — Progress Notes (Signed)
Patient ID: Ruth [name], female    DOB: 1960-05-26, 50 y.o.   MRN: [id number]  HPI 4/161/2- 58 yoF last seen here 03/18/08 for allergy/ asthma consultation for Dr Laury Axon. Note is reviewed. Allergy vaccine helped 8 years ago in [place]. She has not been in control, with itching eyes, head and chest congested. Perennial symptoms. She flared 2 weeks ago without clear change in exposure. 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 She was given singulair, tessalon, Veramyst, augmentin, no prednisone.  Uses Dulera 100 regularly. Still itch and sneeze, itching eyes. Less cough.  09/11/10-  Allergic conjunctivitis/ rhinitis, Asthma  Eyes always itch, still some hoarse, nasal drainage. Denies chest tightness or wheeze taking daily Dulera. Doing better now that Spring pollens have subsided. Taking Veramyst, no eye drops, taking Singulair- not helpful.  Allergy profile - 07/13/10- IgE 768.9, broadly positive Prednisone   03/12/11-50 year old female never smoker followed for Allergic conjunctivitis/ rhinitis, Asthma  Pataday works well for her allergic conjunctivitis. Zyrtec was not effective enough as an antihistamine. Complains of persistent raspiness in her throat, ears itch a lot and she sneezes. Dulera controls her wheezing but she asks if it is the cause of her hoarseness. Omnaris and Veramyst both caused retro-orbital headache.  Review of Systems  See HPI Constitutional:   No-   weight loss, night sweats, fevers, chills, fatigue, lassitude. HEENT:   No-  headaches, difficulty swallowing, tooth/dental problems, sore throat,       +sneezing, itching,  No-ear ache, +nasal congestion, post nasal drip,  CV:  No-   chest pain, orthopnea, PND, swelling in lower extremities, anasarca,                                  dizziness, palpitations Resp: No-   shortness of breath with exertion or at rest.              No-   productive cough,  No non-productive cough,  No-  coughing up of blood.              No-   change in color of mucus.  Controlled wheezing.   Skin: No-   rash or lesions. GI:  No-   heartburn, indigestion, abdominal pain, nausea, vomiting, diarrhea,                 change in bowel habits, loss of appetite GU:. MS:  No-   joint pain or swelling.  No- decreased range of motion.  No- back pain. Neuro-     nothing unusual Psych:  No- change in mood or affect. No depression or anxiety.  No memory loss.    Objective:   Physical Exam General- Alert, Oriented, Affect-appropriate, Distress- none acute Skin- rash-none, lesions- none, excoriation- none Lymphadenopathy- none Head- atraumatic            Eyes- Gross vision intact, PERRLA, conjunctivae clear secretions            Ears- Hearing, canals-normal            Nose- Clear, no-Septal dev, mucus, polyps, erosion, perforation             Throat- Mallampati II , mucosa clear , drainage- none, tonsils- atrophic. Hoarse without thrush Neck- flexible , trachea midline, no stridor , thyroid nl, carotid no bruit Chest - symmetrical excursion , unlabored  Heart/CV- RRR , no murmur , no gallop  , no rub, nl s1 s2                           - JVD- none , edema- none, stasis changes- none, varices- none           Lung- clear to P&A, wheeze- none, cough- none , dullness-none, rub- none           Chest wall-  Abd- tender-no, distended-no, bowel sounds-present, HSM- no Br/ Gen/ Rectal- Not done, not indicated Extrem- cyanosis- none, clubbing, none, atrophy- none, strength- nl Neuro- grossly intact to observation

## 2011-03-12 NOTE — Patient Instructions (Signed)
Ok to continue Dulera 100. Add an aerochamber spacer  Sample Patanase nasal antihistamine spray   1-2 puffs each nostril, up to twice daily if needed  Ok to continue Zyrtec, or try loratadine or fexofenadine as an antihistamine  Ok to try using your steroid nasal spray- Veramyst or Omnaris- just one puff in each nostril, once daily- see if that works without the headaches

## 2011-03-14 NOTE — Assessment & Plan Note (Signed)
Plan-continue Dulera, add AeroChamber. Hopefully this will reduce the hoarseness.

## 2011-03-14 NOTE — Assessment & Plan Note (Signed)
We will try to increase her antihistamine support by adding a nasal antihistamine. The nasal steroids we tried caused headache.

## 2011-06-01 ENCOUNTER — Other Ambulatory Visit: Payer: Self-pay | Admitting: Family Medicine

## 2011-06-01 NOTE — Telephone Encounter (Signed)
Last seen 01/21/11 and filled 01/20/11. Please advise   Kp

## 2011-06-16 ENCOUNTER — Telehealth: Payer: Self-pay | Admitting: Internal Medicine

## 2011-06-16 NOTE — Telephone Encounter (Signed)
I spoke with pt and she c/o dry cough, watery/itchy eyes, sneezing, nasal congestion, runny nose, chest tightness, hoarseness x 3 days. Denies any fever, chills, sweats, nausea, vomiting, body aches, sore throat. Pt has been taking allegra and it has helped with the sneezing. Pt is requesting further recs. Please advise Dr. Maple Hudson, thanks  No Known Allergies    [facility] [address]

## 2011-06-16 NOTE — Telephone Encounter (Signed)
lmomtcb x1 

## 2011-06-16 NOTE — Telephone Encounter (Signed)
LMOM for pt TCB 

## 2011-06-16 NOTE — Telephone Encounter (Signed)
Pt returned call. Kathleen W Perdue  

## 2011-06-16 NOTE — Telephone Encounter (Signed)
Pt returned triage's call.  Ruth [name] ° °

## 2011-06-16 NOTE — Telephone Encounter (Signed)
Per cdy: Pick yp allegra-d and try that  I spoke with pt and is aware of this and nothing further was needed

## 2011-06-16 NOTE — Telephone Encounter (Signed)
Pt returned call & asked to be reached after 5:00 p.m. Today.  Ruth [name]

## 2011-09-10 ENCOUNTER — Telehealth: Payer: Self-pay | Admitting: Internal Medicine

## 2011-09-10 MED ORDER — OLOPATADINE HCL 0.2 % OP SOLN: OPHTHALMIC | Status: DC

## 2011-09-10 MED ORDER — OLOPATADINE HCL 0.6 % NA SOLN: NASAL | Status: DC

## 2011-09-10 NOTE — Telephone Encounter (Signed)
Spoke with pt  States that the allegra is not helping with her allergies. Feels congested ,itchy watery and red eyes.  Would like rx for pantanase  And also pt asking for eye drops stated that she was gave a sample in office  And it seemed to help.She wasn't sure  Of name.  No Known Allergies Dr Maple Hudson please advise .  thank you

## 2011-09-10 NOTE — Telephone Encounter (Signed)
Per CY-patanase #1 1-2 sprays in each nostril once daily and Pataday #1 1 drop in each eye dialy with prn refill for both Rx's.

## 2011-09-10 NOTE — Telephone Encounter (Signed)
Pt aware of CDY recs. Rx's have been sent to the pharmacy.

## 2011-09-10 NOTE — Telephone Encounter (Signed)
Pt return call °

## 2011-09-10 NOTE — Telephone Encounter (Signed)
lmtcb

## 2011-11-01 ENCOUNTER — Other Ambulatory Visit: Payer: Self-pay | Admitting: Family Medicine

## 2011-11-01 NOTE — Telephone Encounter (Signed)
Last seen 01/21/11. Please advise    KP

## 2011-12-02 ENCOUNTER — Other Ambulatory Visit: Payer: Self-pay | Admitting: Internal Medicine

## 2011-12-07 ENCOUNTER — Telehealth: Payer: Self-pay | Admitting: Internal Medicine

## 2011-12-07 MED ORDER — MOMETASONE FURO-FORMOTEROL FUM 100-5 MCG/ACT IN AERO: 2.0000 | INHALATION_SPRAY | Freq: Two times a day (BID) | RESPIRATORY_TRACT | Status: DC

## 2011-12-07 NOTE — Telephone Encounter (Signed)
Pt last seen by CY 12.14.12, follow up in 4 months >> no appt scheduled, none pending.   Called spoke with patient, advised we will be happy to fill her dulera for her, but she is overdue for appt with CY.  Follow up scheduled for 9.12.13 @ 3:15pm.  Pt okay with this date and time.  Refills on dulera sent to verified pharmacy.  Nothing further needed at this time; will sign off.

## 2011-12-09 ENCOUNTER — Ambulatory Visit (INDEPENDENT_AMBULATORY_CARE_PROVIDER_SITE_OTHER): Payer: Managed Care, Other (non HMO) | Admitting: Internal Medicine

## 2011-12-09 ENCOUNTER — Encounter: Payer: Self-pay | Admitting: Internal Medicine

## 2011-12-09 VITALS — BP 122/70 | HR 84 | Ht 64.0 in | Wt 203.0 lb

## 2011-12-09 DIAGNOSIS — J301 Allergic rhinitis due to pollen: Secondary | ICD-10-CM

## 2011-12-09 DIAGNOSIS — L259 Unspecified contact dermatitis, unspecified cause: Secondary | ICD-10-CM

## 2011-12-09 DIAGNOSIS — J45909 Unspecified asthma, uncomplicated: Secondary | ICD-10-CM

## 2011-12-09 MED ORDER — AZELASTINE-FLUTICASONE 137-50 MCG/ACT NA SUSP: 2.0000 | Freq: Every day | NASAL | Status: DC

## 2011-12-09 MED ORDER — PHENYLEPHRINE HCL 1 % NA SOLN
3.0000 [drp] | Freq: Once | NASAL | Status: AC
  Administered 2011-12-09: 3 [drp] via NASAL

## 2011-12-09 MED ORDER — METHYLPREDNISOLONE ACETATE 80 MG/ML IJ SUSP
80.0000 mg | Freq: Once | INTRAMUSCULAR | Status: AC
  Administered 2011-12-09: 80 mg via INTRAMUSCULAR

## 2011-12-09 NOTE — Progress Notes (Signed)
Patient ID: Ruth [name], female    DOB: May 05, 1960, 51 y.o.   MRN: [id number]  HPI 4/161/2- 50 yoF last seen here 03/18/08 for allergy/ asthma consultation for Dr Laury Axon. Note is reviewed. Allergy vaccine helped 8 years ago in [place]. She has not been in control, with itching eyes, head and chest congested. Perennial symptoms. She flared 2 weeks ago without clear change in exposure. 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 She was given singulair, tessalon, Veramyst, augmentin, no prednisone.  Uses Dulera 100 regularly. Still itch and sneeze, itching eyes. Less cough.  09/11/10-  Allergic conjunctivitis/ rhinitis, Asthma  Eyes always itch, still some hoarse, nasal drainage. Denies chest tightness or wheeze taking daily Dulera. Doing better now that Spring pollens have subsided. Taking Veramyst, no eye drops, taking Singulair- not helpful.  Allergy profile - 07/13/10- IgE 768.9, broadly positive Prednisone   03/12/11-51 year old female never smoker followed for Allergic conjunctivitis/ rhinitis, Asthma  Pataday works well for her allergic conjunctivitis. Zyrtec was not effective enough as an antihistamine. Complains of persistent raspiness in her throat, ears itch a lot and she sneezes. Dulera controls her wheezing but she asks if it is the cause of her hoarseness. Omnaris and Veramyst both caused retro-orbital headache.  12/09/11- 51 year old female never smoker followed for Allergic conjunctivitis/ rhinitis, Asthma  No sleep past 3 nights-PND, cough-non-'productive; raspy voice. Blames seasonal allergy over the past week. Skin itches. Some chest tightness and mild wheeze. No sore throat or fever. Has some frontal headache and ears feel full. Nasonex does not help.   Review of Systems  See HPI Constitutional:   No-   weight loss, night sweats, fevers, chills, fatigue, lassitude. HEENT:   No-  headaches, difficulty swallowing, tooth/dental problems,  sore throat,       +sneezing, itching,  No-ear ache, +nasal congestion, post nasal drip,  CV:  No-   chest pain, orthopnea, PND, swelling in lower extremities, anasarca,  dizziness, palpitations Resp: No-   shortness of breath with exertion or at rest.              No-   productive cough,  No non-productive cough,  No- coughing up of blood.              No-   change in color of mucus.  +Controlled wheezing.   Skin: No-   rash or lesions. GI:  No-   heartburn, indigestion, abdominal pain, nausea, vomiting,  GU:. MS:  No-   joint pain or swelling.   Neuro-     nothing unusual Psych:  No- change in mood or affect. No depression or anxiety.  No memory loss.   Objective:   Physical Exam General- Alert, Oriented, Affect-appropriate, Distress- none acute Skin- +eczematoid rash on face and arms Lymphadenopathy- none Head- atraumatic            Eyes- Gross vision intact, PERRLA, conjunctivae clear secretions            Ears- Hearing, canals-normal            Nose- Clear, no-Septal dev, mucus, polyps, erosion, perforation             Throat- Mallampati II , mucosa clear , drainage- none, tonsils- atrophic. Hoarse without thrush Neck- flexible , trachea midline, no stridor , thyroid nl, carotid no bruit Chest - symmetrical excursion , unlabored           Heart/CV- RRR ,  no murmur , no gallop  , no rub, nl s1 s2                           - JVD- none , edema- none, stasis changes- none, varices- none           Lung- clear to P&A, wheeze- none, cough- none , dullness-none, rub- none           Chest wall-  Abd-  Br/ Gen/ Rectal- Not done, not indicated Extrem- cyanosis- none, clubbing, none, atrophy- none, strength- nl Neuro- grossly intact to observation

## 2011-12-09 NOTE — Patient Instructions (Addendum)
Sample Dymista nasal spray   1-2 puffs each nostril every night at bedtime. Try this instead of Nasonex  Neb neo nasal  Depo 80  Please call as needed

## 2011-12-17 NOTE — Assessment & Plan Note (Signed)
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 Plan-nasal nebulizer, Depo-Medrol, sample of Dymista spray instead of Nasonex. Consider allergy skin testing for vaccine.

## 2011-12-17 NOTE — Assessment & Plan Note (Signed)
Exacerbation associated with increased allergy stimulation. Plan-discussed Elidel

## 2011-12-17 NOTE — Assessment & Plan Note (Signed)
Very mild intermittent asthma now with allergic trigger. Depo-Medrol should help.

## 2012-01-06 ENCOUNTER — Telehealth: Payer: Self-pay | Admitting: *Deleted

## 2012-01-06 MED ORDER — PREDNISONE 10 MG PO TABS: ORAL_TABLET | ORAL | Status: DC

## 2012-01-06 NOTE — Telephone Encounter (Signed)
Pt c/o increase SOB, increase cough very litle production clear in color, feels congested. Using nasal spray, allegra d and dulera.  Per CDY call in 8 day pred taper  Pt is aware of CDY recs. Nothing further was needed

## 2012-01-14 ENCOUNTER — Telehealth: Payer: Self-pay | Admitting: Internal Medicine

## 2012-01-14 NOTE — Telephone Encounter (Signed)
Called, spoke with pt who states she had a medical cpx yesterday for a job.  Her bp was 158/104 x 3 different readings. Reports she never had a problem with HTN before and was advised by the nurse doing cpx that prednisone can increase BP.  She went to [facility] today and had them recheck BP.  It was 158/104 with regular cuff and 139/93 with a large cuff.  She reports slight HAs, dizziness, and slight blurred vision x 2 days.  Denies slurred speak or weakness.  Today is the last day to take prednisone.  She is requesting Dr. Roxy Cedar thoughts on this -- could this be a s/e from the prednisone.  Pls advise.  Thank you.

## 2012-01-14 NOTE — Telephone Encounter (Signed)
Per CY-if from prednisone, BP should decrease steadily over next few days to her normal baseline. She can ask her PCP about treating,tracking but if from prednisone, BP may drop fast if BP pill is added.

## 2012-01-14 NOTE — Telephone Encounter (Signed)
I spoke with pt and is aware of CDY recs. She stated she will monitor her BP and will call her pcp regarding this

## 2012-02-09 ENCOUNTER — Other Ambulatory Visit: Payer: Self-pay | Admitting: *Deleted

## 2012-02-09 MED ORDER — MOMETASONE FURO-FORMOTEROL FUM 100-5 MCG/ACT IN AERO: 2.0000 | INHALATION_SPRAY | Freq: Two times a day (BID) | RESPIRATORY_TRACT | Status: DC

## 2012-02-10 ENCOUNTER — Other Ambulatory Visit: Payer: Self-pay | Admitting: Family Medicine

## 2012-02-10 MED ORDER — DESONIDE 0.05 % EX LOTN: TOPICAL_LOTION | Freq: Two times a day (BID) | CUTANEOUS | Status: DC

## 2012-02-10 NOTE — Telephone Encounter (Signed)
refill Desonide (Lotion) 0.05 % APPLY TOPICALLY DAILY --- last wrt 8.5.13  last ov 10.25.12 acute

## 2012-02-10 NOTE — Telephone Encounter (Signed)
Please advise      KP 

## 2012-02-23 ENCOUNTER — Other Ambulatory Visit: Payer: Self-pay

## 2012-02-23 NOTE — Telephone Encounter (Signed)
Pt LMOVM asking about desonide lotion suppose to be sent to [facility]. Pt stated think we sent it to [facility]. After reviewing pt chart seen Kim sent to [facility] on 02/10/12. I called pt LMOVM advising pt Rx sent to [facility] [place] [facility] and provided pt with their number 02/10/12.       MW

## 2012-06-12 ENCOUNTER — Ambulatory Visit (INDEPENDENT_AMBULATORY_CARE_PROVIDER_SITE_OTHER): Payer: BC Managed Care – PPO | Admitting: Family Medicine

## 2012-06-12 ENCOUNTER — Encounter: Payer: Self-pay | Admitting: Family Medicine

## 2012-06-12 VITALS — BP 152/90 | HR 97 | Temp 99.3°F | Wt 199.6 lb

## 2012-06-12 DIAGNOSIS — I10 Essential (primary) hypertension: Secondary | ICD-10-CM | POA: Insufficient documentation

## 2012-06-12 LAB — BASIC METABOLIC PANEL
BUN: 13 mg/dL (ref 6–23)
CO2: 26 mEq/L (ref 19–32)
Calcium: 8.9 mg/dL (ref 8.4–10.5)
Chloride: 103 mEq/L (ref 96–112)
Creatinine, Ser: 0.9 mg/dL (ref 0.4–1.2)
GFR: 82.32 mL/min (ref >60.00)
Glucose, Bld: 79 mg/dL (ref 70–99)
Potassium: 3.6 mEq/L (ref 3.5–5.1)
Sodium: 138 mEq/L (ref 135–145)

## 2012-06-12 MED ORDER — HYDROCHLOROTHIAZIDE 25 MG PO TABS: 25.0000 mg | ORAL_TABLET | Freq: Every day | ORAL | Status: DC

## 2012-06-12 NOTE — Progress Notes (Signed)
  Subjective:    Ruth [name] is a 52 y.o. female who presents for evaluation of elevated blood pressures. Age at onset of elevated blood pressure:  51. Cardiac symptoms: none. Patient denies: chest pain, chest pressure/discomfort, claudication, dyspnea, exertional chest pressure/discomfort, fatigue, irregular heart beat, lower extremity edema, near-syncope, orthopnea, palpitations, paroxysmal nocturnal dyspnea, syncope and tachypnea. Cardiovascular risk factors: obesity (BMI >= 30 kg/m2) and sedentary lifestyle. Use of agents associated with hypertension: none. History of target organ damage: none.  The following portions of the patient's history were reviewed and updated as appropriate: allergies, current medications, past family history, past medical history, past social history, past surgical history and problem list.  Review of Systems Pertinent items are noted in HPI.   Objective:    BP 152/90  Pulse 97  Temp(Src) 99.3 F (37.4 C) (Oral)  Wt 199 lb 9.6 oz (90.538 kg)  BMI 34.24 kg/m2  SpO2 97% General appearance: alert, cooperative, appears stated age and no distress Neck: no adenopathy, supple, symmetrical, trachea midline and thyroid not enlarged, symmetric, no tenderness/mass/nodules Lungs: clear to auscultation bilaterally Heart: S1, S2 normal Extremities: edema trace pitting edema low ext  Cardiographics ECG: not done    Assessment:    Hypertension, stage 1 . Evidence of target organ damage: none.    Plan:    Medication: begin hctz . Dietary sodium restriction. Regular aerobic exercise. Check blood pressures 2-3 times weekly and record. Follow up: 3 weeks and as needed.

## 2012-06-12 NOTE — Patient Instructions (Addendum)
Sodium-Controlled Diet  Sodium is a mineral. It is found in many foods. Sodium may be found naturally or added during the making of a food. The most common form of sodium is salt, which is made up of sodium and chloride. Reducing your sodium intake involves changing your eating habits.  The following guidelines will help you reduce the sodium in your diet:   Stop using the salt shaker.   Use salt sparingly in cooking and baking.   Substitute with sodium-free seasonings and spices.   Do not use a salt substitute (potassium chloride) without your caregiver's permission.   Include a variety of fresh, unprocessed foods in your diet.   Limit the use of processed and convenience foods that are high in sodium.  USE THE FOLLOWING FOODS SPARINGLY:  Breads/Starches   Commercial bread stuffing, commercial pancake or waffle mixes, coating mixes. Waffles. Croutons. Prepared (boxed or frozen) potato, rice, or noodle mixes that contain salt or sodium. Salted French fries or hash browns. Salted popcorn, breads, crackers, chips, or snack foods.  Vegetables   Vegetables canned with salt or prepared in cream, butter, or cheese sauces. Sauerkraut. Tomato or vegetable juices canned with salt.   Fresh vegetables are allowed if rinsed thoroughly.  Fruit   Fruit is okay to eat.  Meat and Meat Substitutes   Salted or smoked meats, such as bacon or Canadian bacon, chipped or corned beef, hot dogs, salt pork, luncheon meats, pastrami, ham, or sausage. Canned or smoked fish, poultry, or meat. Processed cheese or cheese spreads, blue or Roquefort cheese. Battered or frozen fish products. Prepared spaghetti sauce. Baked beans. Reuben sandwiches. Salted nuts. Caviar.  Milk   Limit buttermilk to 1 cup per week.  Soups and Combination Foods    Bouillon cubes, canned or dried soups, broth, consomm. Convenience (frozen or packaged) dinners with more than 600 mg sodium. Pot pies, pizza, Asian food, fast food cheeseburgers, and specialty sandwiches.  Desserts and Sweets   Regular (salted) desserts, pie, commercial fruit snack pies, commercial snack cakes, canned puddings.   Eat desserts and sweets in moderation.  Fats and Oils   Gravy mixes or canned gravy. No more than 1 to 2 tbs of salad dressing. Chip dips.   Eat fats and oils in moderation.  Beverages   See those listed under the vegetables and milk groups.  Condiments   Ketchup, mustard, meat sauces, salsa, regular (salted) and lite soy sauce or mustard. Dill pickles, olives, meat tenderizer. Prepared horseradish or pickle relish. Dutch-processed cocoa. Baking powder or baking soda used medicinally. Worcestershire sauce. "Light" salt. Salt substitute, unless approved by your caregiver.  Document Released: 09/04/2001 Document Revised: 06/07/2011 Document Reviewed: 04/07/2009  [employer] Patient Information 2013 [employer].

## 2012-06-13 ENCOUNTER — Encounter: Payer: Self-pay | Admitting: *Deleted

## 2012-07-03 ENCOUNTER — Ambulatory Visit (INDEPENDENT_AMBULATORY_CARE_PROVIDER_SITE_OTHER): Payer: BC Managed Care – PPO | Admitting: Family Medicine

## 2012-07-03 ENCOUNTER — Encounter: Payer: Self-pay | Admitting: Family Medicine

## 2012-07-03 VITALS — BP 126/90 | HR 75 | Temp 99.1°F | Wt 199.4 lb

## 2012-07-03 DIAGNOSIS — R319 Hematuria, unspecified: Secondary | ICD-10-CM

## 2012-07-03 DIAGNOSIS — I10 Essential (primary) hypertension: Secondary | ICD-10-CM

## 2012-07-03 LAB — MICROALBUMIN / CREATININE URINE RATIO: Microalb Creat Ratio: 0.6 mg/g (ref 0.0–30.0)

## 2012-07-03 LAB — BASIC METABOLIC PANEL
CO2: 30 mEq/L (ref 19–32)
Calcium: 9 mg/dL (ref 8.4–10.5)
Chloride: 101 mEq/L (ref 96–112)
Creatinine, Ser: 1 mg/dL (ref 0.4–1.2)
Glucose, Bld: 85 mg/dL (ref 70–99)

## 2012-07-03 LAB — POCT URINALYSIS DIPSTICK
Bilirubin, UA: NEGATIVE
Ketones, UA: NEGATIVE
Leukocytes, UA: NEGATIVE
Spec Grav, UA: 1.01
pH, UA: 6.5

## 2012-07-03 LAB — LIPID PANEL
HDL: 51.6 mg/dL (ref >39.00)
Triglycerides: 75 mg/dL (ref 0.0–149.0)
VLDL: 15 mg/dL (ref 0.0–40.0)

## 2012-07-03 LAB — CBC WITH DIFFERENTIAL/PLATELET
Basophils Absolute: 0 10*3/uL (ref 0.0–0.1)
Eosinophils Absolute: 0.2 10*3/uL (ref 0.0–0.7)
Lymphocytes Relative: 25.7 % (ref 12.0–46.0)
MCHC: 33.2 g/dL (ref 30.0–36.0)
MCV: 85.5 fl (ref 78.0–100.0)
Monocytes Absolute: 0.3 10*3/uL (ref 0.1–1.0)
Neutro Abs: 3.3 10*3/uL (ref 1.4–7.7)
Neutrophils Relative %: 64.8 % (ref 43.0–77.0)
RDW: 13.9 % (ref 11.5–14.6)

## 2012-07-03 LAB — HEPATIC FUNCTION PANEL
Albumin: 3.6 g/dL (ref 3.5–5.2)
Alkaline Phosphatase: 84 U/L (ref 39–117)
Bilirubin, Direct: 0 mg/dL (ref 0.0–0.3)
Total Protein: 7.3 g/dL (ref 6.0–8.3)

## 2012-07-03 MED ORDER — DESONIDE 0.05 % EX LOTN: TOPICAL_LOTION | Freq: Two times a day (BID) | CUTANEOUS | Status: DC

## 2012-07-03 MED ORDER — AMLODIPINE BESYLATE 5 MG PO TABS: 5.0000 mg | ORAL_TABLET | Freq: Every day | ORAL | Status: DC

## 2012-07-03 MED ORDER — HYDROCHLOROTHIAZIDE 25 MG PO TABS: 25.0000 mg | ORAL_TABLET | Freq: Every day | ORAL | Status: DC

## 2012-07-03 NOTE — Addendum Note (Signed)
Addended by: Silvio Pate D on: 07/03/2012 03:05 PM   Modules accepted: Orders

## 2012-07-03 NOTE — Progress Notes (Signed)
  Subjective:    Patient here for follow-up of elevated blood pressure.  She is exercising and is adherent to a low-salt diet.  Blood pressure is not well controlled at home. Cardiac symptoms: none. Patient denies: chest pain, chest pressure/discomfort, claudication, dyspnea, exertional chest pressure/discomfort, fatigue, irregular heart beat, lower extremity edema, near-syncope, orthopnea, palpitations, paroxysmal nocturnal dyspnea, syncope and tachypnea. Cardiovascular risk factors: hypertension and obesity (BMI >= 30 kg/m2). Use of agents associated with hypertension: none. History of target organ damage: none.  The following portions of the patient's history were reviewed and updated as appropriate: allergies, current medications, past family history, past medical history, past social history, past surgical history and problem list.  Review of Systems Pertinent items are noted in HPI.     Objective:    BP 126/90  Pulse 75  Temp(Src) 99.1 F (37.3 C) (Oral)  Wt 199 lb 6.4 oz (90.447 kg)  BMI 34.21 kg/m2  SpO2 98% General appearance: alert, cooperative, appears stated age and no distress Lungs: clear to auscultation bilaterally Heart: S1, S2 normal Extremities: extremities normal, atraumatic, no cyanosis or edema    Assessment:    Hypertension, stage 1 . Evidence of target organ damage: none.    Plan:    Medication: begin norvasc 5 mg. Dietary sodium restriction. Regular aerobic exercise. Check blood pressures 2-3 times weekly and record. Follow up: 3 weeks and as needed.

## 2012-07-03 NOTE — Patient Instructions (Signed)

## 2012-07-04 LAB — URINE CULTURE: Colony Count: 50000

## 2012-07-11 MED ORDER — POTASSIUM CHLORIDE CRYS ER 20 MEQ PO TBCR: 20.0000 meq | EXTENDED_RELEASE_TABLET | Freq: Every day | ORAL | Status: DC

## 2012-07-13 ENCOUNTER — Encounter: Payer: Self-pay | Admitting: Family Medicine

## 2012-07-13 ENCOUNTER — Ambulatory Visit (INDEPENDENT_AMBULATORY_CARE_PROVIDER_SITE_OTHER): Payer: BC Managed Care – PPO | Admitting: Family Medicine

## 2012-07-13 VITALS — BP 126/78 | HR 93 | Temp 98.4°F | Wt 195.6 lb

## 2012-07-13 DIAGNOSIS — R079 Chest pain, unspecified: Secondary | ICD-10-CM

## 2012-07-13 DIAGNOSIS — I10 Essential (primary) hypertension: Secondary | ICD-10-CM

## 2012-07-13 MED ORDER — METOPROLOL SUCCINATE ER 50 MG PO TB24: 50.0000 mg | ORAL_TABLET | Freq: Every day | ORAL | Status: DC

## 2012-07-13 NOTE — Progress Notes (Signed)
  Subjective:    Patient here for follow-up of elevated blood pressure.  She is not exercising and is adherent to a low-salt diet.  Blood pressure is not well controlled at home. Cardiac symptoms: chest pain and fatigue. Patient denies: claudication, dyspnea, irregular heart beat, lower extremity edema, near-syncope, orthopnea, palpitations, paroxysmal nocturnal dyspnea, syncope and tachypnea. Cardiovascular risk factors: hypertension and sedentary lifestyle. Use of agents associated with hypertension: none. History of target organ damage: none. Pt feels like she has felt bad since starting bp med---it makes her sick.  The following portions of the patient's history were reviewed and updated as appropriate: allergies, current medications, past family history, past medical history, past social history, past surgical history and problem list.  Review of Systems Pertinent items are noted in HPI.     Objective:    BP 126/78  Pulse 93  Temp(Src) 98.4 F (36.9 C) (Oral)  Wt 195 lb 9.6 oz (88.724 kg)  BMI 33.56 kg/m2  SpO2 97% General appearance: alert, cooperative, appears stated age and no distress Throat: lips, mucosa, and tongue normal; teeth and gums normal Neck: no adenopathy, supple, symmetrical, trachea midline and thyroid not enlarged, symmetric, no tenderness/mass/nodules Lungs: clear to auscultation bilaterally Heart: S1, S2 normal Extremities: extremities normal, atraumatic, no cyanosis or edema    Assessment:    Hypertension, normal blood pressure . Evidence of target organ damage: none.    Plan:    Medication: discontinue norvasc and begin toprol. Dietary sodium restriction. Regular aerobic exercise. Check blood pressures 2-3 times weekly and record. Follow up: 2 weeks and as needed.

## 2012-07-13 NOTE — Patient Instructions (Addendum)

## 2012-07-20 ENCOUNTER — Other Ambulatory Visit (HOSPITAL_COMMUNITY): Payer: Self-pay | Admitting: Family Medicine

## 2012-07-20 DIAGNOSIS — R072 Precordial pain: Secondary | ICD-10-CM

## 2012-07-24 ENCOUNTER — Ambulatory Visit: Payer: BC Managed Care – PPO | Admitting: Family Medicine

## 2012-07-25 ENCOUNTER — Ambulatory Visit (HOSPITAL_COMMUNITY): Payer: BC Managed Care – PPO | Attending: Family Medicine

## 2012-07-25 DIAGNOSIS — I10 Essential (primary) hypertension: Secondary | ICD-10-CM | POA: Insufficient documentation

## 2012-07-25 DIAGNOSIS — R072 Precordial pain: Secondary | ICD-10-CM

## 2012-07-25 NOTE — Progress Notes (Signed)
Echocardiogram performed.  

## 2012-07-26 ENCOUNTER — Encounter (HOSPITAL_COMMUNITY): Payer: Self-pay | Admitting: Family Medicine

## 2012-07-27 ENCOUNTER — Telehealth: Payer: Self-pay | Admitting: Family Medicine

## 2012-07-27 NOTE — Telephone Encounter (Signed)
See echo 

## 2012-07-27 NOTE — Telephone Encounter (Signed)
Did not see where these results have been read. Please advise.

## 2012-07-27 NOTE — Telephone Encounter (Signed)
Pt called to see if her echocardiogram results were back. thanks

## 2012-07-28 NOTE — Telephone Encounter (Signed)
Patient aware the Echo was normal.    KP

## 2012-08-14 ENCOUNTER — Ambulatory Visit (INDEPENDENT_AMBULATORY_CARE_PROVIDER_SITE_OTHER): Payer: BC Managed Care – PPO | Admitting: Family Medicine

## 2012-08-14 ENCOUNTER — Encounter: Payer: Self-pay | Admitting: Family Medicine

## 2012-08-14 VITALS — BP 98/72 | HR 75 | Temp 98.3°F | Ht 64.75 in | Wt 199.0 lb

## 2012-08-14 DIAGNOSIS — Z0000 Encounter for general adult medical examination without abnormal findings: Secondary | ICD-10-CM

## 2012-08-14 DIAGNOSIS — I10 Essential (primary) hypertension: Secondary | ICD-10-CM

## 2012-08-14 LAB — CBC WITH DIFFERENTIAL/PLATELET
Basophils Absolute: 0 10*3/uL (ref 0.0–0.1)
Eosinophils Relative: 3.2 % (ref 0.0–5.0)
Lymphocytes Relative: 28.9 % (ref 12.0–46.0)
Lymphs Abs: 1.6 10*3/uL (ref 0.7–4.0)
Monocytes Relative: 5.2 % (ref 3.0–12.0)
Neutrophils Relative %: 62.1 % (ref 43.0–77.0)
Platelets: 219 10*3/uL (ref 150.0–400.0)
RDW: 14 % (ref 11.5–14.6)
WBC: 5.7 10*3/uL (ref 4.5–10.5)

## 2012-08-14 LAB — HEPATIC FUNCTION PANEL
AST: 14 U/L (ref 0–37)
Alkaline Phosphatase: 78 U/L (ref 39–117)
Bilirubin, Direct: 0 mg/dL (ref 0.0–0.3)
Total Bilirubin: 0.3 mg/dL (ref 0.3–1.2)

## 2012-08-14 LAB — LIPID PANEL
Cholesterol: 135 mg/dL (ref 0–200)
LDL Cholesterol: 71 mg/dL (ref 0–99)
Total CHOL/HDL Ratio: 3
VLDL: 11.4 mg/dL (ref 0.0–40.0)

## 2012-08-14 LAB — TSH: TSH: 1.42 u[IU]/mL (ref 0.35–5.50)

## 2012-08-14 LAB — BASIC METABOLIC PANEL
BUN: 14 mg/dL (ref 6–23)
Calcium: 8.8 mg/dL (ref 8.4–10.5)
GFR: 85.43 mL/min (ref >60.00)
Glucose, Bld: 84 mg/dL (ref 70–99)

## 2012-08-14 MED ORDER — METOPROLOL SUCCINATE ER 50 MG PO TB24: ORAL_TABLET | ORAL | Status: DC

## 2012-08-14 NOTE — Assessment & Plan Note (Signed)
Low today Stop hctz and K supp Dec toprol xl 1/2 tab qd

## 2012-08-14 NOTE — Patient Instructions (Signed)
Preventive Care for Adults, Female A healthy lifestyle and preventive care can promote health and wellness. Preventive health guidelines for women include the following key practices.  A routine yearly physical is a good way to check with your caregiver about your health and preventive screening. It is a chance to share any concerns and updates on your health, and to receive a thorough exam.  Visit your dentist for a routine exam and preventive care every 6 months. Brush your teeth twice a day and floss once a day. Good oral hygiene prevents tooth decay and gum disease.  The frequency of eye exams is based on your age, health, family medical history, use of contact lenses, and other factors. Follow your caregiver's recommendations for frequency of eye exams.  Eat a healthy diet. Foods like vegetables, fruits, whole grains, low-fat dairy products, and lean protein foods contain the nutrients you need without too many calories. Decrease your intake of foods high in solid fats, added sugars, and salt. Eat the right amount of calories for you.Get information about a proper diet from your caregiver, if necessary.  Regular physical exercise is one of the most important things you can do for your health. Most adults should get at least 150 minutes of moderate-intensity exercise (any activity that increases your heart rate and causes you to sweat) each week. In addition, most adults need muscle-strengthening exercises on 2 or more days a week.  Maintain a healthy weight. The body mass index (BMI) is a screening tool to identify possible weight problems. It provides an estimate of body fat based on height and weight. Your caregiver can help determine your BMI, and can help you achieve or maintain a healthy weight.For adults 20 years and older:  A BMI below 18.5 is considered underweight.  A BMI of 18.5 to 24.9 is normal.  A BMI of 25 to 29.9 is considered overweight.  A BMI of 30 and above is  considered obese.  Maintain normal blood lipids and cholesterol levels by exercising and minimizing your intake of saturated fat. Eat a balanced diet with plenty of fruit and vegetables. Blood tests for lipids and cholesterol should begin at age 20 and be repeated every 5 years. If your lipid or cholesterol levels are high, you are over 50, or you are at high risk for heart disease, you may need your cholesterol levels checked more frequently.Ongoing high lipid and cholesterol levels should be treated with medicines if diet and exercise are not effective.  If you smoke, find out from your caregiver how to quit. If you do not use tobacco, do not start.  If you are pregnant, do not drink alcohol. If you are breastfeeding, be very cautious about drinking alcohol. If you are not pregnant and choose to drink alcohol, do not exceed 1 drink per day. One drink is considered to be 12 ounces (355 mL) of beer, 5 ounces (148 mL) of wine, or 1.5 ounces (44 mL) of liquor.  Avoid use of street drugs. Do not share needles with anyone. Ask for help if you need support or instructions about stopping the use of drugs.  High blood pressure causes heart disease and increases the risk of stroke. Your blood pressure should be checked at least every 1 to 2 years. Ongoing high blood pressure should be treated with medicines if weight loss and exercise are not effective.  If you are 55 to 50 years old, ask your caregiver if you should take aspirin to prevent strokes.  Diabetes   screening involves taking a blood sample to check your fasting blood sugar level. This should be done once every 3 years, after age 45, if you are within normal weight and without risk factors for diabetes. Testing should be considered at a younger age or be carried out more frequently if you are overweight and have at least 1 risk factor for diabetes.  Breast cancer screening is essential preventive care for women. You should practice "breast  self-awareness." This means understanding the normal appearance and feel of your breasts and may include breast self-examination. Any changes detected, no matter how small, should be reported to a caregiver. Women in their 20s and 30s should have a clinical breast exam (CBE) by a caregiver as part of a regular health exam every 1 to 3 years. After age 40, women should have a CBE every year. Starting at age 40, women should consider having a mammography (breast X-ray test) every year. Women who have a family history of breast cancer should talk to their caregiver about genetic screening. Women at a high risk of breast cancer should talk to their caregivers about having magnetic resonance imaging (MRI) and a mammography every year.  The Pap test is a screening test for cervical cancer. A Pap test can show cell changes on the cervix that might become cervical cancer if left untreated. A Pap test is a procedure in which cells are obtained and examined from the lower end of the uterus (cervix).  Women should have a Pap test starting at age 21.  Between ages 21 and 29, Pap tests should be repeated every 2 years.  Beginning at age 30, you should have a Pap test every 3 years as long as the past 3 Pap tests have been normal.  Some women have medical problems that increase the chance of getting cervical cancer. Talk to your caregiver about these problems. It is especially important to talk to your caregiver if a new problem develops soon after your last Pap test. In these cases, your caregiver may recommend more frequent screening and Pap tests.  The above recommendations are the same for women who have or have not gotten the vaccine for human papillomavirus (HPV).  If you had a hysterectomy for a problem that was not cancer or a condition that could lead to cancer, then you no longer need Pap tests. Even if you no longer need a Pap test, a regular exam is a good idea to make sure no other problems are  starting.  If you are between ages 65 and 70, and you have had normal Pap tests going back 10 years, you no longer need Pap tests. Even if you no longer need a Pap test, a regular exam is a good idea to make sure no other problems are starting.  If you have had past treatment for cervical cancer or a condition that could lead to cancer, you need Pap tests and screening for cancer for at least 20 years after your treatment.  If Pap tests have been discontinued, risk factors (such as a new sexual partner) need to be reassessed to determine if screening should be resumed.  The HPV test is an additional test that may be used for cervical cancer screening. The HPV test looks for the virus that can cause the cell changes on the cervix. The cells collected during the Pap test can be tested for HPV. The HPV test could be used to screen women aged 30 years and older, and should   be used in women of any age who have unclear Pap test results. After the age of 30, women should have HPV testing at the same frequency as a Pap test.  Colorectal cancer can be detected and often prevented. Most routine colorectal cancer screening begins at the age of 50 and continues through age 75. However, your caregiver may recommend screening at an earlier age if you have risk factors for colon cancer. On a yearly basis, your caregiver may provide home test kits to check for hidden blood in the stool. Use of a small camera at the end of a tube, to directly examine the colon (sigmoidoscopy or colonoscopy), can detect the earliest forms of colorectal cancer. Talk to your caregiver about this at age 50, when routine screening begins. Direct examination of the colon should be repeated every 5 to 10 years through age 75, unless early forms of pre-cancerous polyps or small growths are found.  Hepatitis C blood testing is recommended for all people born from 1945 through 1965 and any individual with known risks for hepatitis C.  Practice  safe sex. Use condoms and avoid high-risk sexual practices to reduce the spread of sexually transmitted infections (STIs). STIs include gonorrhea, chlamydia, syphilis, trichomonas, herpes, HPV, and human immunodeficiency virus (HIV). Herpes, HIV, and HPV are viral illnesses that have no cure. They can result in disability, cancer, and death. Sexually active women aged 25 and younger should be checked for chlamydia. Older women with new or multiple partners should also be tested for chlamydia. Testing for other STIs is recommended if you are sexually active and at increased risk.  Osteoporosis is a disease in which the bones lose minerals and strength with aging. This can result in serious bone fractures. The risk of osteoporosis can be identified using a bone density scan. Women ages 65 and over and women at risk for fractures or osteoporosis should discuss screening with their caregivers. Ask your caregiver whether you should take a calcium supplement or vitamin D to reduce the rate of osteoporosis.  Menopause can be associated with physical symptoms and risks. Hormone replacement therapy is available to decrease symptoms and risks. You should talk to your caregiver about whether hormone replacement therapy is right for you.  Use sunscreen with sun protection factor (SPF) of 30 or more. Apply sunscreen liberally and repeatedly throughout the day. You should seek shade when your shadow is shorter than you. Protect yourself by wearing long sleeves, pants, a wide-brimmed hat, and sunglasses year round, whenever you are outdoors.  Once a month, do a whole body skin exam, using a mirror to look at the skin on your back. Notify your caregiver of new moles, moles that have irregular borders, moles that are larger than a pencil eraser, or moles that have changed in shape or color.  Stay current with required immunizations.  Influenza. You need a dose every fall (or winter). The composition of the flu vaccine  changes each year, so being vaccinated once is not enough.  Pneumococcal polysaccharide. You need 1 to 2 doses if you smoke cigarettes or if you have certain chronic medical conditions. You need 1 dose at age 65 (or older) if you have never been vaccinated.  Tetanus, diphtheria, pertussis (Tdap, Td). Get 1 dose of Tdap vaccine if you are younger than age 65, are over 65 and have contact with an infant, are a healthcare worker, are pregnant, or simply want to be protected from whooping cough. After that, you need a Td   booster dose every 10 years. Consult your caregiver if you have not had at least 3 tetanus and diphtheria-containing shots sometime in your life or have a deep or dirty wound.  HPV. You need this vaccine if you are a woman age 26 or younger. The vaccine is given in 3 doses over 6 months.  Measles, mumps, rubella (MMR). You need at least 1 dose of MMR if you were born in 1957 or later. You may also need a second dose.  Meningococcal. If you are age 19 to 21 and a first-year college student living in a residence hall, or have one of several medical conditions, you need to get vaccinated against meningococcal disease. You may also need additional booster doses.  Zoster (shingles). If you are age 60 or older, you should get this vaccine.  Varicella (chickenpox). If you have never had chickenpox or you were vaccinated but received only 1 dose, talk to your caregiver to find out if you need this vaccine.  Hepatitis A. You need this vaccine if you have a specific risk factor for hepatitis A virus infection or you simply wish to be protected from this disease. The vaccine is usually given as 2 doses, 6 to 18 months apart.  Hepatitis B. You need this vaccine if you have a specific risk factor for hepatitis B virus infection or you simply wish to be protected from this disease. The vaccine is given in 3 doses, usually over 6 months. Preventive Services / Frequency Ages 19 to 39  Blood  pressure check.** / Every 1 to 2 years.  Lipid and cholesterol check.** / Every 5 years beginning at age 20.  Clinical breast exam.** / Every 3 years for women in their 20s and 30s.  Pap test.** / Every 2 years from ages 21 through 29. Every 3 years starting at age 30 through age 65 or 70 with a history of 3 consecutive normal Pap tests.  HPV screening.** / Every 3 years from ages 30 through ages 65 to 70 with a history of 3 consecutive normal Pap tests.  Hepatitis C blood test.** / For any individual with known risks for hepatitis C.  Skin self-exam. / Monthly.  Influenza immunization.** / Every year.  Pneumococcal polysaccharide immunization.** / 1 to 2 doses if you smoke cigarettes or if you have certain chronic medical conditions.  Tetanus, diphtheria, pertussis (Tdap, Td) immunization. / A one-time dose of Tdap vaccine. After that, you need a Td booster dose every 10 years.  HPV immunization. / 3 doses over 6 months, if you are 26 and younger.  Measles, mumps, rubella (MMR) immunization. / You need at least 1 dose of MMR if you were born in 1957 or later. You may also need a second dose.  Meningococcal immunization. / 1 dose if you are age 19 to 21 and a first-year college student living in a residence hall, or have one of several medical conditions, you need to get vaccinated against meningococcal disease. You may also need additional booster doses.  Varicella immunization.** / Consult your caregiver.  Hepatitis A immunization.** / Consult your caregiver. 2 doses, 6 to 18 months apart.  Hepatitis B immunization.** / Consult your caregiver. 3 doses usually over 6 months. Ages 40 to 64  Blood pressure check.** / Every 1 to 2 years.  Lipid and cholesterol check.** / Every 5 years beginning at age 20.  Clinical breast exam.** / Every year after age 40.  Mammogram.** / Every year beginning at age 40   and continuing for as long as you are in good health. Consult with your  caregiver.  Pap test.** / Every 3 years starting at age 30 through age 65 or 70 with a history of 3 consecutive normal Pap tests.  HPV screening.** / Every 3 years from ages 30 through ages 65 to 70 with a history of 3 consecutive normal Pap tests.  Fecal occult blood test (FOBT) of stool. / Every year beginning at age 50 and continuing until age 75. You may not need to do this test if you get a colonoscopy every 10 years.  Flexible sigmoidoscopy or colonoscopy.** / Every 5 years for a flexible sigmoidoscopy or every 10 years for a colonoscopy beginning at age 50 and continuing until age 75.  Hepatitis C blood test.** / For all people born from 1945 through 1965 and any individual with known risks for hepatitis C.  Skin self-exam. / Monthly.  Influenza immunization.** / Every year.  Pneumococcal polysaccharide immunization.** / 1 to 2 doses if you smoke cigarettes or if you have certain chronic medical conditions.  Tetanus, diphtheria, pertussis (Tdap, Td) immunization.** / A one-time dose of Tdap vaccine. After that, you need a Td booster dose every 10 years.  Measles, mumps, rubella (MMR) immunization. / You need at least 1 dose of MMR if you were born in 1957 or later. You may also need a second dose.  Varicella immunization.** / Consult your caregiver.  Meningococcal immunization.** / Consult your caregiver.  Hepatitis A immunization.** / Consult your caregiver. 2 doses, 6 to 18 months apart.  Hepatitis B immunization.** / Consult your caregiver. 3 doses, usually over 6 months. Ages 65 and over  Blood pressure check.** / Every 1 to 2 years.  Lipid and cholesterol check.** / Every 5 years beginning at age 20.  Clinical breast exam.** / Every year after age 40.  Mammogram.** / Every year beginning at age 40 and continuing for as long as you are in good health. Consult with your caregiver.  Pap test.** / Every 3 years starting at age 30 through age 65 or 70 with a 3  consecutive normal Pap tests. Testing can be stopped between 65 and 70 with 3 consecutive normal Pap tests and no abnormal Pap or HPV tests in the past 10 years.  HPV screening.** / Every 3 years from ages 30 through ages 65 or 70 with a history of 3 consecutive normal Pap tests. Testing can be stopped between 65 and 70 with 3 consecutive normal Pap tests and no abnormal Pap or HPV tests in the past 10 years.  Fecal occult blood test (FOBT) of stool. / Every year beginning at age 50 and continuing until age 75. You may not need to do this test if you get a colonoscopy every 10 years.  Flexible sigmoidoscopy or colonoscopy.** / Every 5 years for a flexible sigmoidoscopy or every 10 years for a colonoscopy beginning at age 50 and continuing until age 75.  Hepatitis C blood test.** / For all people born from 1945 through 1965 and any individual with known risks for hepatitis C.  Osteoporosis screening.** / A one-time screening for women ages 65 and over and women at risk for fractures or osteoporosis.  Skin self-exam. / Monthly.  Influenza immunization.** / Every year.  Pneumococcal polysaccharide immunization.** / 1 dose at age 65 (or older) if you have never been vaccinated.  Tetanus, diphtheria, pertussis (Tdap, Td) immunization. / A one-time dose of Tdap vaccine if you are over   65 and have contact with an infant, are a healthcare worker, or simply want to be protected from whooping cough. After that, you need a Td booster dose every 10 years.  Varicella immunization.** / Consult your caregiver.  Meningococcal immunization.** / Consult your caregiver.  Hepatitis A immunization.** / Consult your caregiver. 2 doses, 6 to 18 months apart.  Hepatitis B immunization.** / Check with your caregiver. 3 doses, usually over 6 months. ** Family history and personal history of risk and conditions may change your caregiver's recommendations. Document Released: 05/11/2001 Document Revised: 06/07/2011  Document Reviewed: 08/10/2010 [employer] Patient Information 2013 [employer].  

## 2012-08-14 NOTE — Progress Notes (Signed)
Subjective:     Ruth [name] is a 52 y.o. female and is here for a comprehensive physical exam. The patient reports no problems.  History   Social History  . Marital Status: Married    Spouse Name: N/A    Number of Children: N/A  . Years of Education: N/A   Occupational History  . [employer]     guard    Social History Main Topics  . Smoking status: Never Smoker   . Smokeless tobacco: Never Used  . Alcohol Use: Yes     Comment: rare  . Drug Use: No  . Sexually Active: Yes -- Female partner(s)   Other Topics Concern  . Not on file   Social History Narrative   Exercise-- walk, run 2x a week   Health Maintenance  Topic Date Due  . Mammogram  08/12/2011  . Influenza Vaccine  11/27/2012  . Pap Smear  08/11/2014  . Tetanus/tdap  06/08/2022    The following portions of the patient's history were reviewed and updated as appropriate:  She  has a past medical history of Asthma; Eczema; and Allergic rhinitis, cause unspecified. She  does not have any pertinent problems on file. She  has past surgical history that includes Cesarean section. Her family history includes Allergies in her father and mother; Asthma in her father and mother; and Drug abuse in her father. She  reports that she has never smoked. She has never used smokeless tobacco. She reports that  drinks alcohol. She reports that she does not use illicit drugs. She has a current medication list which includes the following prescription(s): albuterol, azelastine-fluticasone, desonide, metoprolol succinate, mometasone-formoterol, and olopatadine hcl. Current Outpatient Prescriptions on File Prior to Visit  Medication Sig Dispense Refill  . albuterol (PROAIR HFA) 108 (90 BASE) MCG/ACT inhaler Inhale 2 puffs into the lungs every 6 (six) hours as needed.        . Azelastine-Fluticasone (DYMISTA) 137-50 MCG/ACT SUSP Place 2 sprays into both nostrils at bedtime.  1 Bottle  0  . desonide (DESOWEN) 0.05 % lotion Apply  topically 2 (two) times daily.  118 mL  2  . mometasone-formoterol (DULERA) 100-5 MCG/ACT AERO Inhale 2 puffs into the lungs 2 (two) times daily. Rinse mouth  1 Inhaler  3  . Olopatadine HCl (PATADAY) 0.2 % SOLN 1 drop in each eye daily  1 Bottle  prn   No current facility-administered medications on file prior to visit.   She has No Known Allergies..  Review of Systems Review of Systems  Constitutional: Negative for activity change, appetite change and fatigue.  HENT: Negative for hearing loss, congestion, tinnitus and ear discharge.  dentist q64m Eyes: Negative for visual disturbance (see optho q1y -- vision corrected to 20/20 with glasses).  Respiratory: Negative for cough, chest tightness and shortness of breath.   Cardiovascular: Negative for chest pain, palpitations and leg swelling.  Gastrointestinal: Negative for abdominal pain, diarrhea, constipation and abdominal distention.  Genitourinary: Negative for urgency, frequency, decreased urine volume and difficulty urinating.  Musculoskeletal: Negative for back pain, arthralgias and gait problem.  Skin: Negative for color change, pallor and rash.  Neurological: Negative for dizziness, light-headedness, numbness and headaches.  Hematological: Negative for adenopathy. Does not bruise/bleed easily.  Psychiatric/Behavioral: Negative for suicidal ideas, confusion, sleep disturbance, self-injury, dysphoric mood, decreased concentration and agitation.       Objective:    BP 98/72  Pulse 75  Temp(Src) 98.3 F (36.8 C) (Oral)  Ht 5' 4.75" (  1.645 m)  Wt 199 lb (90.266 kg)  BMI 33.36 kg/m2  SpO2 99% General appearance: alert, cooperative, appears stated age and no distress Head: Normocephalic, without obvious abnormality, atraumatic Eyes: conjunctivae/corneas clear. PERRL, EOM's intact. Fundi benign. Ears: normal TM's and external ear canals both ears Nose: Nares normal. Septum midline. Mucosa normal. No drainage or sinus  tenderness. Throat: lips, mucosa, and tongue normal; teeth and gums normal Neck: no adenopathy, no carotid bruit, no JVD, supple, symmetrical, trachea midline and thyroid not enlarged, symmetric, no tenderness/mass/nodules Back: symmetric, no curvature. ROM normal. No CVA tenderness. Lungs: clear to auscultation bilaterally Breasts: gyn Heart: regular rate and rhythm, S1, S2 normal, no murmur, click, rub or gallop Abdomen: soft, non-tender; bowel sounds normal; no masses,  no organomegaly Pelvic: deferred---gyn Extremities: extremities normal, atraumatic, no cyanosis or edema Pulses: 2+ and symmetric Skin: Skin color, texture, turgor normal. No rashes or lesions Lymph nodes: Cervical, supraclavicular, and axillary nodes normal. Neurologic: Alert and oriented X 3, normal strength and tone. Normal symmetric reflexes. Normal coordination and gait Psych--- no depression, no anxiety      Assessment:    Healthy female exam.     Plan:    ghm utd Check labs See After Visit Summary for Counseling Recommendations

## 2012-08-15 LAB — POCT URINALYSIS DIPSTICK
Nitrite, UA: NEGATIVE
Protein, UA: NEGATIVE
Spec Grav, UA: 1.01
Urobilinogen, UA: 0.2

## 2012-09-05 ENCOUNTER — Ambulatory Visit (INDEPENDENT_AMBULATORY_CARE_PROVIDER_SITE_OTHER): Payer: BC Managed Care – PPO | Admitting: Family Medicine

## 2012-09-05 ENCOUNTER — Encounter: Payer: Self-pay | Admitting: Family Medicine

## 2012-09-05 VITALS — BP 138/92 | HR 86 | Temp 98.6°F | Wt 202.4 lb

## 2012-09-05 DIAGNOSIS — T169XXA Foreign body in ear, unspecified ear, initial encounter: Secondary | ICD-10-CM

## 2012-09-05 DIAGNOSIS — T162XXA Foreign body in left ear, initial encounter: Secondary | ICD-10-CM | POA: Insufficient documentation

## 2012-09-05 DIAGNOSIS — H60399 Other infective otitis externa, unspecified ear: Secondary | ICD-10-CM

## 2012-09-05 DIAGNOSIS — H60392 Other infective otitis externa, left ear: Secondary | ICD-10-CM

## 2012-09-05 MED ORDER — OFLOXACIN 0.3 % OT SOLN: 10.0000 [drp] | Freq: Every day | OTIC | Status: DC

## 2012-09-05 NOTE — Progress Notes (Signed)
  Subjective:    Patient ID: Ruth [name], female    DOB: 12/26/1960, 52 y.o.   MRN: [id number]  HPI Pt here c/o piece of cotton in her L ear.  She was trying to scratch her ear with qtip and tip came off.  No other complaints.   Review of Systems    as above Objective:   Physical Exam   BP 138/92  Pulse 86  Temp(Src) 98.6 F (37 C) (Oral)  Wt 202 lb 6.4 oz (91.808 kg)  BMI 33.93 kg/m2  SpO2 98% General appearance: alert, cooperative, appears stated age and no distress Ears: R ear normal,  L ear --+ cotton visualized.   removed with alligator forceps Nose: Nares normal. Septum midline. Mucosa normal. No drainage or sinus tenderness.     Assessment & Plan:

## 2012-09-05 NOTE — Assessment & Plan Note (Signed)
Removed with forceps floxin otic gtts F/u prn

## 2012-09-05 NOTE — Patient Instructions (Addendum)
Otitis Externa Otitis externa is a bacterial or fungal infection of the outer ear canal. This is the area from the eardrum to the outside of the ear. Otitis externa is sometimes called "swimmer's ear." CAUSES  Possible causes of infection include:  Swimming in dirty water.  Moisture remaining in the ear after swimming or bathing.  Mild injury (trauma) to the ear.  Objects stuck in the ear (foreign body).  Cuts or scrapes (abrasions) on the outside of the ear. SYMPTOMS  The first symptom of infection is often itching in the ear canal. Later signs and symptoms may include swelling and redness of the ear canal, ear pain, and yellowish-white fluid (pus) coming from the ear. The ear pain may be worse when pulling on the earlobe. DIAGNOSIS  Your caregiver will perform a physical exam. A sample of fluid may be taken from the ear and examined for bacteria or fungi. TREATMENT  Antibiotic ear drops are often given for 10 to 14 days. Treatment may also include pain medicine or corticosteroids to reduce itching and swelling. PREVENTION   Keep your ear dry. Use the corner of a towel to absorb water out of the ear canal after swimming or bathing.  Avoid scratching or putting objects inside your ear. This can damage the ear canal or remove the protective wax that lines the canal. This makes it easier for bacteria and fungi to grow.  Avoid swimming in lakes, polluted water, or poorly chlorinated pools.  You may use ear drops made of rubbing alcohol and vinegar after swimming. Combine equal parts of white vinegar and alcohol in a bottle. Put 3 or 4 drops into each ear after swimming. HOME CARE INSTRUCTIONS   Apply antibiotic ear drops to the ear canal as prescribed by your caregiver.  Only take over-the-counter or prescription medicines for pain, discomfort, or fever as directed by your caregiver.  If you have diabetes, follow any additional treatment instructions from your caregiver.  Keep all  follow-up appointments as directed by your caregiver. SEEK MEDICAL CARE IF:   You have a fever.  Your ear is still red, swollen, painful, or draining pus after 3 days.  Your redness, swelling, or pain gets worse.  You have a severe headache.  You have redness, swelling, pain, or tenderness in the area behind your ear. MAKE SURE YOU:   Understand these instructions.  Will watch your condition.  Will get help right away if you are not doing well or get worse. Document Released: 03/15/2005 Document Revised: 06/07/2011 Document Reviewed: 04/01/2011 Exit[employer] Patient Information 2014 [employer].  

## 2012-10-30 ENCOUNTER — Other Ambulatory Visit: Payer: Self-pay | Admitting: Internal Medicine

## 2012-10-31 ENCOUNTER — Other Ambulatory Visit: Payer: Self-pay | Admitting: Internal Medicine

## 2012-11-02 ENCOUNTER — Telehealth: Payer: Self-pay | Admitting: *Deleted

## 2012-11-02 NOTE — Telephone Encounter (Signed)
pulm should be doing it-- pt was supposed to have 6 month f/u with them

## 2012-11-02 NOTE — Telephone Encounter (Signed)
Pharmacy is requesting a prescription refill for [facility] inhalation aerosol. Patient last ov was 09/05/12 and last date filled was 02/09/12 but the ordering physician was Dr. Jetty Duhamel M.D. Need to know if Dr. Laury Axon will authorize this refill.  Please Advise AG cma

## 2012-11-02 NOTE — Telephone Encounter (Signed)
Prescription was faxed to pulmonary 11/02/12. Ag cma

## 2012-11-07 ENCOUNTER — Telehealth: Payer: Self-pay | Admitting: Internal Medicine

## 2012-11-07 MED ORDER — MOMETASONE FURO-FORMOTEROL FUM 100-5 MCG/ACT IN AERO: 2.0000 | INHALATION_SPRAY | Freq: Two times a day (BID) | RESPIRATORY_TRACT | Status: DC

## 2012-11-07 NOTE — Telephone Encounter (Signed)
LMOVM that Rx has been sent to pharmacy as requested.

## 2012-11-21 ENCOUNTER — Ambulatory Visit: Payer: BC Managed Care – PPO | Admitting: Internal Medicine

## 2012-11-22 ENCOUNTER — Encounter: Payer: Self-pay | Admitting: Internal Medicine

## 2012-11-22 ENCOUNTER — Ambulatory Visit (INDEPENDENT_AMBULATORY_CARE_PROVIDER_SITE_OTHER): Payer: BC Managed Care – PPO | Admitting: Internal Medicine

## 2012-11-22 VITALS — BP 120/84 | HR 116 | Ht 64.0 in | Wt 205.0 lb

## 2012-11-22 DIAGNOSIS — J45909 Unspecified asthma, uncomplicated: Secondary | ICD-10-CM

## 2012-11-22 DIAGNOSIS — J301 Allergic rhinitis due to pollen: Secondary | ICD-10-CM

## 2012-11-22 DIAGNOSIS — J45998 Other asthma: Secondary | ICD-10-CM

## 2012-11-22 MED ORDER — FLUTICASONE PROPIONATE 50 MCG/ACT NA SUSP: 2.0000 | Freq: Every day | NASAL | Status: DC

## 2012-11-22 MED ORDER — MOMETASONE FURO-FORMOTEROL FUM 200-5 MCG/ACT IN AERO: INHALATION_SPRAY | RESPIRATORY_TRACT | Status: DC

## 2012-11-22 MED ORDER — OLOPATADINE HCL 0.2 % OP SOLN: OPHTHALMIC | Status: DC

## 2012-11-22 NOTE — Patient Instructions (Addendum)
Sample and script for Dulera 200       2 puffs then rinse mouth, twice daily           Try this instead of the [facility] 100  Scripts sent for Pataday and Flonase/ fluticasone  Please call as needed

## 2012-11-22 NOTE — Progress Notes (Signed)
Patient ID: Ruth [name], female    DOB: February 07, 1961, 52 y.o.   MRN: [id number]  HPI 4/161/2- 76 yoF last seen here 03/18/08 for allergy/ asthma consultation for Dr Laury Axon. Note is reviewed. Allergy vaccine helped 8 years ago in [place]. She has not been in control, with itching eyes, head and chest congested. Perennial symptoms. She flared 2 weeks ago without clear change in exposure. 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 She was given singulair, tessalon, Veramyst, augmentin, no prednisone.  Uses Dulera 100 regularly. Still itch and sneeze, itching eyes. Less cough.  09/11/10-  Allergic conjunctivitis/ rhinitis, Asthma  Eyes always itch, still some hoarse, nasal drainage. Denies chest tightness or wheeze taking daily Dulera. Doing better now that Spring pollens have subsided. Taking Veramyst, no eye drops, taking Singulair- not helpful.  Allergy profile - 07/13/10- IgE 768.9, broadly positive Prednisone   03/12/11-52 year old female never smoker followed for Allergic conjunctivitis/ rhinitis, Asthma  Pataday works well for her allergic conjunctivitis. Zyrtec was not effective enough as an antihistamine. Complains of persistent raspiness in her throat, ears itch a lot and she sneezes. Dulera controls her wheezing but she asks if it is the cause of her hoarseness. Omnaris and Veramyst both caused retro-orbital headache.  12/09/11- 52 year old female never smoker followed for Allergic conjunctivitis/ rhinitis, Asthma  No sleep past 3 nights-PND, cough-non-'productive; raspy voice. Blames seasonal allergy over the past week. Skin itches. Some chest tightness and mild wheeze. No sore throat or fever. Has some frontal headache and ears feel full. Nasonex does not help.  11/22/12- 52 year old female never smoker followed for Allergic conjunctivitis/ rhinitis, Asthma FOLLOWS FOR: flare up at this time; flared up asthma as well recently. Has had to use rescue  inhaler again. Some seasonal increased wheeze. Mild nasal congestion and itching of eyes. Out of Pataday, but using Allegra and Dulera.  Review of Systems See HPI Constitutional:   No-   weight loss, night sweats, fevers, chills, fatigue, lassitude. HEENT:   No-  headaches, difficulty swallowing, tooth/dental problems, sore throat,       +sneezing,+ itching,  No-ear ache, +nasal congestion, post nasal drip,  CV:  No-   chest pain, orthopnea, PND, swelling in lower extremities, anasarca,  dizziness, palpitations Resp: No-   shortness of breath with exertion or at rest.              No-   productive cough,  No non-productive cough,  No- coughing up of blood.              No-   change in color of mucus.  +Controlled wheezing.   Skin: No-   rash or lesions. GI:  No-   heartburn, indigestion, abdominal pain, nausea, vomiting,  GU:. MS:  No-   joint pain or swelling.   Neuro-     nothing unusual Psych:  No- change in mood or affect. No depression or anxiety.  No memory loss.   Objective:   Physical Exam General- Alert, Oriented, Affect-appropriate, Distress- none acute Skin- +eczematoid rash on face and arms Lymphadenopathy- none Head- atraumatic            Eyes- Gross vision intact, PERRLA, conjunctivae clear secretions            Ears- Hearing, canals-normal            Nose- Clear, no-Septal dev, mucus, polyps, erosion, perforation  Throat- Mallampati II , mucosa clear , drainage- none, tonsils- atrophic.  Neck- flexible , trachea midline, no stridor , thyroid nl, carotid no bruit Chest - symmetrical excursion , unlabored           Heart/CV- RRR , no murmur , no gallop  , no rub, nl s1 s2                           - JVD- none , edema- none, stasis changes- none, varices- none           Lung- clear to P&A, wheeze- none, cough- none , dullness-none, rub- none           Chest wall-  Abd-  Br/ Gen/ Rectal- Not done, not indicated Extrem- cyanosis- none, clubbing, none,  atrophy- none, strength- nl Neuro- grossly intact to observation

## 2012-12-03 NOTE — Assessment & Plan Note (Signed)
Symptomatic flare would be consistent with ragweed season. Plan-use Flonase and Allegra

## 2012-12-03 NOTE — Assessment & Plan Note (Signed)
Increase strength of Dulera 200

## 2012-12-19 ENCOUNTER — Telehealth: Payer: Self-pay | Admitting: Internal Medicine

## 2012-12-19 MED ORDER — AMOXICILLIN-POT CLAVULANATE 875-125 MG PO TABS: 1.0000 | ORAL_TABLET | Freq: Two times a day (BID) | ORAL | Status: DC

## 2012-12-19 NOTE — Telephone Encounter (Signed)
Spoke with patient, Patient c/o worsening sinus headache x 2 days States when she woke up from work this afternoon she tried to take 3 ibuprofens and lay down however this did not relieve the pressure Says her R nare is "clogged", she is sneezing and has tenderness when touching R side of head Denies any cough, F/C/or S Requesting recs per Dr. Maple Hudson, please advise, thank you!  [facility]- [address]  Last OV 11/19/12 with no pending appt  No Known Allergies

## 2012-12-19 NOTE — Telephone Encounter (Signed)
Called spoke with patient, advised of CY's recs as stated below.  Pt verbalized her understanding and denied any questions.  Rx sent to verified pharmacy, will sign off.

## 2012-12-19 NOTE — Telephone Encounter (Signed)
Per CY-okay to give Augmentin 875 mg #14 take 1 po BID no refills; Mucinex D at pharmacy 1 po BID till this opens up.

## 2013-03-27 ENCOUNTER — Telehealth: Payer: Self-pay | Admitting: Internal Medicine

## 2013-03-27 MED ORDER — PREDNISONE 10 MG PO TABS: ORAL_TABLET | ORAL | Status: DC

## 2013-03-27 NOTE — Telephone Encounter (Signed)
Per CY-lets give patient Prednisone 10 mg #20 take 4 x 2 days, 3 x 2 days, 2 x 2 days, 1 x 2 days, then stop no refills. Thanks.  

## 2013-03-27 NOTE — Telephone Encounter (Signed)
Pt aware of recs. rx sent. Nothing further needed 

## 2013-03-27 NOTE — Telephone Encounter (Signed)
Last OV 11-22-12. Pt states at christmas she was around someone with a dog and ever since then she has been having nasal congestion, chest tightness, itchy ears and throat, cough with minimal clear phlegm, and sneezing. Pt states she has been taking allegra, dulera, flonase without any relief. Please advise. Carron Curie, CMA No Known Allergies  Current Outpatient Prescriptions on File Prior to Visit  Medication Sig Dispense Refill  . albuterol (PROAIR HFA) 108 (90 BASE) MCG/ACT inhaler Inhale 2 puffs into the lungs every 6 (six) hours as needed.        Marland Kitchen amoxicillin-clavulanate (AUGMENTIN) 875-125 MG per tablet Take 1 tablet by mouth 2 (two) times daily.  14 tablet  0  . desonide (DESOWEN) 0.05 % lotion Apply topically 2 (two) times daily.  118 mL  2  . fluticasone (FLONASE) 50 MCG/ACT nasal spray Place 2 sprays into the nose daily.  16 g  prn  . metoprolol succinate (TOPROL XL) 50 MG 24 hr tablet 1/2 tab po qd.  Take with or immediately following a meal.  45 tablet  1  . mometasone-formoterol (DULERA) 200-5 MCG/ACT AERO 2 puffs then rinse mouth, twice daily- maintenance inhaler  1 Inhaler  prn  . Olopatadine HCl (PATADAY) 0.2 % SOLN 1 drop in each eye daily  1 Bottle  prn   No current facility-administered medications on file prior to visit.

## 2013-05-07 ENCOUNTER — Encounter: Payer: Self-pay | Admitting: Internal Medicine

## 2013-05-07 ENCOUNTER — Ambulatory Visit (INDEPENDENT_AMBULATORY_CARE_PROVIDER_SITE_OTHER): Payer: BC Managed Care – PPO | Admitting: Internal Medicine

## 2013-05-07 VITALS — BP 138/95 | HR 90 | Temp 98.2°F | Wt 204.0 lb

## 2013-05-07 DIAGNOSIS — J069 Acute upper respiratory infection, unspecified: Secondary | ICD-10-CM

## 2013-05-07 MED ORDER — AZITHROMYCIN 250 MG PO TABS: ORAL_TABLET | ORAL | Status: DC

## 2013-05-07 NOTE — Patient Instructions (Signed)
For cough, take Mucinex DM twice a day as needed  For congestion use your flonase  nasal sprays on each side of the nose daily until you feel better claritin OTC 10 mg 1 a day until better  Continue with albuterol as needed Take the antibiotic as prescribed  (zithromax ) if no better in 3-4 days  Call if no better in few days Call anytime if the symptoms are severe

## 2013-05-07 NOTE — Progress Notes (Signed)
Pre visit review using our clinic review tool, if applicable. No additional management support is needed unless otherwise documented below in the visit note. 

## 2013-05-07 NOTE — Progress Notes (Signed)
   Subjective:    Patient ID: Ruth [name], female    DOB: 03-02-1961, 53 y.o.   MRN: [id number]  DOS:  05/07/2013 Acute visit ,we discussed the following issues  Symptoms started 4 days ago with cough with occasional yellow sputum, raspy voice, sneezing, sinus pressure worse on the right. She has a history of asthma, had to use her rescue inhaler x 1 last week.   Past Medical History  Diagnosis Date  . Asthma   . Eczema   . Allergic rhinitis, cause unspecified     Past Surgical History  Procedure Laterality Date  . Cesarean section      History   Social History  . Marital Status: Married    Spouse Name: N/A    Number of Children: N/A  . Years of Education: N/A   Occupational History  . [employer]     guard    Social History Main Topics  . Smoking status: Never Smoker   . Smokeless tobacco: Never Used  . Alcohol Use: Yes     Comment: rare  . Drug Use: No  . Sexual Activity: Yes    Partners: Male   Other Topics Concern  . Not on file   Social History Narrative   Exercise-- walk, run 2x a week     ROS   Denies fevers, chills?. No nausea, no vomiting, diarrhea. No myalgias She works w/ public, likely has been exposed to sick people    Objective:   Physical Exam BP 138/95  Pulse 90  Temp(Src) 98.2 F (36.8 C)  Wt 204 lb (92.534 kg)  SpO2 98%  General -- alert, well-developed, NAD.  HEENT-- Not pale. TMs normal, throat symmetric, no redness or discharge. Face symmetric, sinuses not tender to palpation. Nose congested. Lungs -- normal respiratory effort, no intercostal retractions, no accessory muscle use, and normal breath sounds. No wheezing  Heart-- normal rate, regular rhythm, no murmur.  Neurologic--  alert & oriented X3. Speech normal, gait normal. EOMI, PERLA   Psych-- Cognition and judgment appear intact. Cooperative with normal attention span and concentration. No anxious or depressed appearing.   Assessment & Plan:   URI  with mild  asthma exacerbation. Early sinusitis? Plan: Conservative treatment, if not improving start a Z-Pak. See instructions

## 2013-05-18 ENCOUNTER — Ambulatory Visit (INDEPENDENT_AMBULATORY_CARE_PROVIDER_SITE_OTHER): Payer: BC Managed Care – PPO | Admitting: Internal Medicine

## 2013-05-18 ENCOUNTER — Encounter: Payer: Self-pay | Admitting: Internal Medicine

## 2013-05-18 VITALS — BP 120/88 | HR 75 | Ht 64.0 in | Wt 207.4 lb

## 2013-05-18 DIAGNOSIS — J309 Allergic rhinitis, unspecified: Secondary | ICD-10-CM

## 2013-05-18 DIAGNOSIS — J329 Chronic sinusitis, unspecified: Secondary | ICD-10-CM

## 2013-05-18 DIAGNOSIS — J019 Acute sinusitis, unspecified: Secondary | ICD-10-CM

## 2013-05-18 DIAGNOSIS — J301 Allergic rhinitis due to pollen: Secondary | ICD-10-CM

## 2013-05-18 DIAGNOSIS — J45998 Other asthma: Secondary | ICD-10-CM

## 2013-05-18 DIAGNOSIS — J45909 Unspecified asthma, uncomplicated: Secondary | ICD-10-CM

## 2013-05-18 MED ORDER — EPINEPHRINE 0.3 MG/0.3ML IJ SOAJ: INTRAMUSCULAR | Status: DC

## 2013-05-18 MED ORDER — TIMOTHY GRASS POLLEN ALLERGEN 2800 BAU SL SUBL: SUBLINGUAL_TABLET | SUBLINGUAL | Status: DC

## 2013-05-18 NOTE — Patient Instructions (Signed)
Script Grastek- 1 under tongue daily through may grass pollen season  Bring your box and your Epipen here so you can take the first one here and wait 30 minutes. After that you can take it at home each day.  Script for Epipen- We will instruct you here  Order- CT max fac-  Dx chronic sinusitis, allergic rhinitis

## 2013-05-18 NOTE — Progress Notes (Signed)
Patient ID: Ruth [name], female    DOB: 1961/03/29, 53 y.o.   MRN: [id number]  HPI 4/161/2- 74 yoF last seen here 03/18/08 for allergy/ asthma consultation for Dr Etter Sjogren. Note is reviewed. Allergy vaccine helped 8 years ago in [place]. She has not been in control, with itching eyes, head and chest congested. Perennial symptoms. She flared 2 weeks ago without clear change in exposure. 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 She was given singulair, tessalon, Veramyst, augmentin, no prednisone.  Uses Dulera 100 regularly. Still itch and sneeze, itching eyes. Less cough.  09/11/10-  Allergic conjunctivitis/ rhinitis, Asthma  Eyes always itch, still some hoarse, nasal drainage. Denies chest tightness or wheeze taking daily Dulera. Doing better now that Spring pollens have subsided. Taking Veramyst, no eye drops, taking Singulair- not helpful.  Allergy profile - 07/13/10- IgE 768.9, broadly positive Prednisone   03/12/11-53 year old female never smoker followed for Allergic conjunctivitis/ rhinitis, Asthma  Pataday works well for her allergic conjunctivitis. Zyrtec was not effective enough as an antihistamine. Complains of persistent raspiness in her throat, ears itch a lot and she sneezes. Dulera controls her wheezing but she asks if it is the cause of her hoarseness. Omnaris and Veramyst both caused retro-orbital headache.  12/09/11- 53 year old female never smoker followed for Allergic conjunctivitis/ rhinitis, Asthma  No sleep past 3 nights-PND, cough-non-'productive; raspy voice. Blames seasonal allergy over the past week. Skin itches. Some chest tightness and mild wheeze. No sore throat or fever. Has some frontal headache and ears feel full. Nasonex does not help.  11/22/12- 53 year old female never smoker followed for Allergic conjunctivitis/ rhinitis, Asthma FOLLOWS FOR: flare up at this time; flared up asthma as well recently. Has had to use rescue  inhaler again. Some seasonal increased wheeze. Mild nasal congestion and itching of eyes. Out of Pataday, but using Allegra and Dulera.  05/18/13- 53 year old female never smoker followed for Allergic conjunctivitis/ rhinitis, Asthma FOLLOWS FOR:Pt was just on abx about a week or so ago. Continues to have sinus pressure/infections. Prednisone in December for wheeze. Zpak helped 1 week ago from PCP. R retro-orbital HA, occ ear pains. Frequent nasal congestion. Rarely uses rescue inhaler, continues Dulera. Worried about allergy season- avoid shots.  Review of Systems See HPI Constitutional:   No-   weight loss, night sweats, fevers, chills, fatigue, lassitude. HEENT:   + headaches, difficulty swallowing, tooth/dental problems, sore throat,       +sneezing,+ itching,  +ear ache, +nasal congestion, post nasal drip,  CV:  No-   chest pain, orthopnea, PND, swelling in lower extremities, anasarca,  dizziness, palpitations Resp: No-   shortness of breath with exertion or at rest.              No-   productive cough,  No non-productive cough,  No- coughing up of blood.              No-   change in color of mucus.  +Controlled wheezing.   Skin: No-   rash or lesions. GI:  No-   heartburn, indigestion, abdominal pain, nausea, vomiting,  GU:. MS:  No-   joint pain or swelling.   Neuro-     nothing unusual Psych:  No- change in mood or affect. No depression or anxiety.  No memory loss.  Objective:   Physical Exam General- Alert, Oriented, Affect-appropriate, Distress- none acute Skin- +eczematoid rash on face and arms Lymphadenopathy- none Head- atraumatic  Eyes- Gross vision intact, PERRLA, conjunctivae clear secretions            Ears- Hearing, canals-normal            Nose- Clear, +Septal dev,No- mucus, polyps, erosion, perforation             Throat- Mallampati II , mucosa clear , drainage- none, tonsils- atrophic.  Neck- flexible , trachea midline, no stridor , thyroid nl, carotid  no bruit Chest - symmetrical excursion , unlabored           Heart/CV- RRR , no murmur , no gallop  , no rub, nl s1 s2                           - JVD- none , edema- none, stasis changes- none, varices- none           Lung- clear to P&A, wheeze- none, cough- none , dullness-none, rub- none           Chest wall-  Abd-  Br/ Gen/ Rectal- Not done, not indicated Extrem- cyanosis- none, clubbing, none, atrophy- none, strength- nl Neuro- grossly intact to observation

## 2013-05-21 ENCOUNTER — Other Ambulatory Visit: Payer: BC Managed Care – PPO

## 2013-05-23 ENCOUNTER — Ambulatory Visit: Payer: BC Managed Care – PPO

## 2013-06-12 NOTE — Assessment & Plan Note (Signed)
Controlled.  

## 2013-06-12 NOTE — Assessment & Plan Note (Signed)
Plan- CT maxfac, Neti Pot

## 2013-06-12 NOTE — Assessment & Plan Note (Signed)
Discussed options. We reviewed oral immunotherapy- Grastek. She would like to try. Plan, [place] with education. Epipen.

## 2013-06-18 ENCOUNTER — Ambulatory Visit: Payer: BC Managed Care – PPO | Admitting: Internal Medicine

## 2013-08-06 ENCOUNTER — Ambulatory Visit: Payer: BC Managed Care – PPO | Admitting: Family Medicine

## 2013-08-06 DIAGNOSIS — Z0289 Encounter for other administrative examinations: Secondary | ICD-10-CM

## 2013-08-24 ENCOUNTER — Ambulatory Visit (INDEPENDENT_AMBULATORY_CARE_PROVIDER_SITE_OTHER): Payer: BC Managed Care – PPO | Admitting: Family Medicine

## 2013-08-24 ENCOUNTER — Encounter: Payer: Self-pay | Admitting: Family Medicine

## 2013-08-24 VITALS — BP 130/90 | HR 81 | Temp 98.7°F | Wt 203.0 lb

## 2013-08-24 DIAGNOSIS — R1011 Right upper quadrant pain: Secondary | ICD-10-CM

## 2013-08-24 DIAGNOSIS — G8929 Other chronic pain: Secondary | ICD-10-CM

## 2013-08-24 DIAGNOSIS — K219 Gastro-esophageal reflux disease without esophagitis: Secondary | ICD-10-CM

## 2013-08-24 DIAGNOSIS — R1013 Epigastric pain: Secondary | ICD-10-CM

## 2013-08-24 LAB — CBC WITH DIFFERENTIAL/PLATELET
Basophils Absolute: 0 10*3/uL (ref 0.0–0.1)
Basophils Relative: 1 % (ref 0–1)
Eosinophils Absolute: 0.2 10*3/uL (ref 0.0–0.7)
Eosinophils Relative: 4 % (ref 0–5)
HEMATOCRIT: 40.3 % (ref 36.0–46.0)
Hemoglobin: 13.7 g/dL (ref 12.0–15.0)
LYMPHS PCT: 39 % (ref 12–46)
Lymphs Abs: 1.7 10*3/uL (ref 0.7–4.0)
MCH: 28.4 pg (ref 26.0–34.0)
MCHC: 34 g/dL (ref 30.0–36.0)
MCV: 83.6 fL (ref 78.0–100.0)
MONO ABS: 0.3 10*3/uL (ref 0.1–1.0)
MONOS PCT: 6 % (ref 3–12)
Neutro Abs: 2.2 10*3/uL (ref 1.7–7.7)
Neutrophils Relative %: 50 % (ref 43–77)
Platelets: 237 10*3/uL (ref 150–400)
RBC: 4.82 MIL/uL (ref 3.87–5.11)
RDW: 14.1 % (ref 11.5–15.5)
WBC: 4.3 10*3/uL (ref 4.0–10.5)

## 2013-08-24 MED ORDER — OMEPRAZOLE 40 MG PO CPDR: 40.0000 mg | DELAYED_RELEASE_CAPSULE | Freq: Every day | ORAL | Status: DC

## 2013-08-24 NOTE — Addendum Note (Signed)
Addended by: Modena Morrow D on: 08/24/2013 04:32 PM   Modules accepted: Orders

## 2013-08-24 NOTE — Patient Instructions (Signed)

## 2013-08-24 NOTE — Progress Notes (Signed)
  Subjective:     Ruth [name] is an 53 y.o. female who presents for evaluation of heartburn. This has been associated with abdominal bloating, belching, fullness after meals, heartburn, midespigastric pain, nausea and ruq pain. She denies chest pain, difficulty swallowing, hematemesis, hoarseness, laryngitis, melena, need to clear throat frequently and shortness of breath. Symptoms have been present for several weeks. She denies dysphagia. She has not lost weight. She denies melena, hematochezia, hematemesis, and coffee ground emesis. Medical therapy in the past has included: antacids.  The following portions of the patient's history were reviewed and updated as appropriate: allergies, current medications, past family history, past medical history, past social history, past surgical history and problem list.  Review of Systems Pertinent items are noted in HPI.   Objective:     BP 130/90  Pulse 81  Temp(Src) 98.7 F (37.1 C) (Oral)  Wt 203 lb (92.08 kg)  SpO2 99% General appearance: alert, cooperative, appears stated age and no distress Lungs: clear to auscultation bilaterally Heart: S1, S2 normal Abdomen: abnormal findings:  moderate tenderness in the epigastrium and in the RUQ   Assessment:    Gastroesophageal Reflux Disease,  And ruq pain     Plan:    Will start a trial of proton pump inhibitors. Follow up in a few weeks or sooner as needed. [place] abd r/o gb

## 2013-08-24 NOTE — Progress Notes (Signed)
Pre visit review using our clinic review tool, if applicable. No additional management support is needed unless otherwise documented below in the visit note. 

## 2013-08-25 ENCOUNTER — Inpatient Hospital Stay (HOSPITAL_BASED_OUTPATIENT_CLINIC_OR_DEPARTMENT_OTHER): Admission: RE | Admit: 2013-08-25 | Payer: BC Managed Care – PPO | Source: Ambulatory Visit

## 2013-08-25 LAB — HEPATIC FUNCTION PANEL
ALK PHOS: 73 U/L (ref 39–117)
ALT: 13 U/L (ref 0–35)
AST: 14 U/L (ref 0–37)
Albumin: 4.1 g/dL (ref 3.5–5.2)
BILIRUBIN DIRECT: 0.2 mg/dL (ref 0.0–0.3)
BILIRUBIN TOTAL: 0.5 mg/dL (ref 0.2–1.2)
Indirect Bilirubin: 0.3 mg/dL (ref 0.2–1.2)
Total Protein: 7.1 g/dL (ref 6.0–8.3)

## 2013-08-25 LAB — BASIC METABOLIC PANEL
BUN: 13 mg/dL (ref 6–23)
CO2: 25 meq/L (ref 19–32)
Calcium: 9.1 mg/dL (ref 8.4–10.5)
Chloride: 106 mEq/L (ref 96–112)
Creat: 0.82 mg/dL (ref 0.50–1.10)
Glucose, Bld: 86 mg/dL (ref 70–99)
POTASSIUM: 3.7 meq/L (ref 3.5–5.3)
SODIUM: 140 meq/L (ref 135–145)

## 2013-08-26 ENCOUNTER — Ambulatory Visit (HOSPITAL_BASED_OUTPATIENT_CLINIC_OR_DEPARTMENT_OTHER)
Admission: RE | Admit: 2013-08-26 | Discharge: 2013-08-26 | Disposition: A | Discharge status: Home / Self Care | Payer: BC Managed Care – PPO | Source: Ambulatory Visit | Attending: Family Medicine | Admitting: Family Medicine

## 2013-08-26 DIAGNOSIS — K8020 Calculus of gallbladder without cholecystitis without obstruction: Secondary | ICD-10-CM | POA: Insufficient documentation

## 2013-08-26 DIAGNOSIS — R1013 Epigastric pain: Secondary | ICD-10-CM | POA: Insufficient documentation

## 2013-08-26 DIAGNOSIS — R1011 Right upper quadrant pain: Secondary | ICD-10-CM | POA: Insufficient documentation

## 2013-08-27 LAB — H. PYLORI ANTIBODY, IGG: H Pylori IgG: 0.45 {ISR}

## 2013-08-30 ENCOUNTER — Telehealth: Payer: Self-pay | Admitting: Family Medicine

## 2013-08-30 NOTE — Telephone Encounter (Signed)
Caller name: Torah  Call back number:[phone]   Reason for call:   Pt returned cll

## 2013-08-31 NOTE — Telephone Encounter (Signed)
Spoke with patient and she wanted to know the size of the stones. I read from the report and also advised that I would mail a copy, she voiced understanding.     KP

## 2013-09-27 ENCOUNTER — Other Ambulatory Visit: Payer: Self-pay | Admitting: Family Medicine

## 2013-11-27 ENCOUNTER — Ambulatory Visit (INDEPENDENT_AMBULATORY_CARE_PROVIDER_SITE_OTHER): Payer: BC Managed Care – PPO | Admitting: Adult Health

## 2013-11-27 ENCOUNTER — Encounter (INDEPENDENT_AMBULATORY_CARE_PROVIDER_SITE_OTHER): Payer: Self-pay

## 2013-11-27 ENCOUNTER — Encounter: Payer: Self-pay | Admitting: Adult Health

## 2013-11-27 VITALS — BP 120/74 | HR 76 | Temp 98.7°F | Ht 65.0 in | Wt 201.4 lb

## 2013-11-27 DIAGNOSIS — J301 Allergic rhinitis due to pollen: Secondary | ICD-10-CM

## 2013-11-27 MED ORDER — FLUTICASONE PROPIONATE 50 MCG/ACT NA SUSP: 2.0000 | Freq: Every day | NASAL | Status: DC

## 2013-11-27 NOTE — Progress Notes (Signed)
Patient ID: Ruth [name], female    DOB: 07-29-1960, 53 y.o.   MRN: [id number]  HPI 4/161/2- 57 yoF last seen here 03/18/08 for allergy/ asthma consultation for Dr Etter Sjogren. Note is reviewed. Allergy vaccine helped 8 years ago in [place]. She has not been in control, with itching eyes, head and chest congested. Perennial symptoms. She flared 2 weeks ago without clear change in exposure. 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 She was given singulair, tessalon, Veramyst, augmentin, no prednisone.  Uses Dulera 100 regularly. Still itch and sneeze, itching eyes. Less cough.  09/11/10-  Allergic conjunctivitis/ rhinitis, Asthma  Eyes always itch, still some hoarse, nasal drainage. Denies chest tightness or wheeze taking daily Dulera. Doing better now that Spring pollens have subsided. Taking Veramyst, no eye drops, taking Singulair- not helpful.  Allergy profile - 07/13/10- IgE 768.9, broadly positive Prednisone   03/12/11-53 year old female never smoker followed for Allergic conjunctivitis/ rhinitis, Asthma  Pataday works well for her allergic conjunctivitis. Zyrtec was not effective enough as an antihistamine. Complains of persistent raspiness in her throat, ears itch a lot and she sneezes. Dulera controls her wheezing but she asks if it is the cause of her hoarseness. Omnaris and Veramyst both caused retro-orbital headache.  12/09/11- 53 year old female never smoker followed for Allergic conjunctivitis/ rhinitis, Asthma  No sleep past 3 nights-PND, cough-non-'productive; raspy voice. Blames seasonal allergy over the past week. Skin itches. Some chest tightness and mild wheeze. No sore throat or fever. Has some frontal headache and ears feel full. Nasonex does not help.  11/22/12- 53 year old female never smoker followed for Allergic conjunctivitis/ rhinitis, Asthma FOLLOWS FOR: flare up at this time; flared up asthma as well recently. Has had to use rescue  inhaler again. Some seasonal increased wheeze. Mild nasal congestion and itching of eyes. Out of Pataday, but using Allegra and Dulera.  05/18/13- 53 year old female never smoker followed for Allergic conjunctivitis/ rhinitis, Asthma FOLLOWS FOR:Pt was just on abx about a week or so ago. Continues to have sinus pressure/infections. Prednisone in December for wheeze. Zpak helped 1 week ago from PCP. R retro-orbital HA, occ ear pains. Frequent nasal congestion. Rarely uses rescue inhaler, continues Dulera. Worried about allergy season- avoid shots.  11/27/2013 Acute OV  Complains of right ear congestion, watery eyes, sneezing, clear nasal drainage, PND w/ hoarseness, some tightness in chest and dyspnea x2-3weeks.   Denies f/c/s, n/v/d, hemoptysis, wheezing.    Review of Systems See HPI Constitutional:   No-   weight loss, night sweats, fevers, chills, fatigue, lassitude. HEENT:    Neg  tooth/dental problems, sore throat,       +sneezing,+ itching,  +ear ache, +nasal congestion, post nasal drip,  CV:  No-   chest pain, orthopnea, PND, swelling in lower extremities, anasarca,  dizziness, palpitations Resp: No-   shortness of breath with exertion or at rest.              No-   productive cough,  No non-productive cough,  No- coughing up of blood.              No-   change in color of mucus.   Skin: No-   rash or lesions. GI:  No-   heartburn, indigestion, abdominal pain, nausea, vomiting,  GU:. MS:  No-   joint pain or swelling.   Neuro-     nothing unusual Psych:  No- change in mood or affect. No depression or  anxiety.  No memory loss.  Objective:   Physical Exam General- Alert, Oriented, Affect-appropriate, Distress- none acute Skin- +eczematoid rash on face and arms Lymphadenopathy- none Head- atraumatic            Eyes- Gross vision intact, PERRLA, conjunctivae clear secretions            Ears- Hearing, canals-normal            Nose- Clear, +Septal dev,No- mucus, polyps, erosion,  perforation , pale mucosa , nontender sinus             Throat- Mallampati II , mucosa clear , drainage- none, tonsils- atrophic.  Neck- flexible , trachea midline, no stridor , thyroid nl, carotid no bruit Chest - symmetrical excursion , unlabored           Heart/CV- RRR , no murmur , no gallop  , no rub, nl s1 s2                           - JVD- none , edema- none, stasis changes- none, varices- none           Lung- clear to P&A, wheeze- none, cough- none , dullness-none, rub- none           Chest wall-  Abd-  Br/ Gen/ Rectal- Not done, not indicated Extrem- cyanosis- none, clubbing, none, atrophy- none, strength- nl Neuro- grossly intact to observation Skin: few eczema patches noted along torso/arms

## 2013-11-27 NOTE — Addendum Note (Signed)
Addended by: Parke Poisson E on: 11/27/2013 03:16 PM   Modules accepted: Orders

## 2013-11-27 NOTE — Patient Instructions (Signed)
Saline nasal rinses As needed   Dymista Nasal 1 puff daily until sample is gone, then return to [employer] D daily As needed  Nasal congestion /drainage /sinus pressure .  May take Chlortrimeton 4mg  2 At bedtime  As needed  Drainage.  follow up Dr. Annamaria Boots  In 3 months and As needed   Please contact office for sooner follow up if symptoms do not improve or worsen or seek emergency care

## 2013-11-27 NOTE — Assessment & Plan Note (Signed)
Flare   Plan  Saline nasal rinses As needed   Dymista Nasal 1 puff daily until sample is gone, then return to Flonase Zyrtec D daily As needed  Nasal congestion /drainage /sinus pressure .  May take Chlortrimeton 4mg  2 At bedtime  As needed  Drainage.  follow up Dr. Annamaria Boots  In 3 months and As needed   Please contact office for sooner follow up if symptoms do not improve or worsen or seek emergency care

## 2013-11-29 ENCOUNTER — Telehealth: Payer: Self-pay | Admitting: Internal Medicine

## 2013-11-29 MED ORDER — PREDNISONE 20 MG PO TABS: 20.0000 mg | ORAL_TABLET | Freq: Every day | ORAL | Status: DC

## 2013-11-29 MED ORDER — AZITHROMYCIN 250 MG PO TABS: ORAL_TABLET | ORAL | Status: DC

## 2013-11-29 NOTE — Telephone Encounter (Signed)
Called and spoke with pt and she is aware of meds per CY.  These have been sent to the pharmacy per pts request and nothing further is needed.

## 2013-11-29 NOTE — Telephone Encounter (Signed)
Offer Zpak          Prednisone 20 mg daily to reduce swelling and inflammation, # 5, 1 daily          Try mucinex-D from pharmacist, instead of Zyrtec-D

## 2013-11-29 NOTE — Telephone Encounter (Signed)
Last OV 11-27-13 with TP. Pt states she has taken Zyrtec D, used dymista as prescribed and is not having any relief. She states she still has sinus pain and pressure and today has developed a very bad sinus headache. Pt states she has taken advil and tis has not helped at all. Pt states she feel extremely congestion in her sinuses and states that the headache is "terrible." Please advise.  Bing, CMA No Known Allergies  Current Outpatient Prescriptions on File Prior to Visit  Medication Sig Dispense Refill  . albuterol (PROAIR HFA) 108 (90 BASE) MCG/ACT inhaler Inhale 2 puffs into the lungs every 6 (six) hours as needed.        . desonide (DESOWEN) 0.05 % lotion Apply to affected area  twice daily  118 mL  1  . EPINEPHrine (EPI-PEN) 0.3 mg/0.3 mL SOAJ injection Inject into upper thigh for severe allergic reaction  1 Device  prn  . fluticasone (FLONASE) 50 MCG/ACT nasal spray Place 2 sprays into both nostrils daily.  16 g  5  . mometasone-formoterol (DULERA) 200-5 MCG/ACT AERO 2 puffs then rinse mouth, twice daily- maintenance inhaler  1 Inhaler  prn  . Olopatadine HCl (PATADAY) 0.2 % SOLN 1 drop in each eye daily  1 Bottle  prn  . omeprazole (PRILOSEC) 40 MG capsule Take 1 capsule (40 mg total) by mouth daily.  30 capsule  3   No current facility-administered medications on file prior to visit.

## 2014-01-08 ENCOUNTER — Other Ambulatory Visit: Payer: Self-pay | Admitting: Internal Medicine

## 2014-01-10 ENCOUNTER — Other Ambulatory Visit: Payer: Self-pay | Admitting: *Deleted

## 2014-01-10 MED ORDER — MOMETASONE FURO-FORMOTEROL FUM 200-5 MCG/ACT IN AERO: INHALATION_SPRAY | RESPIRATORY_TRACT | Status: DC

## 2014-02-28 ENCOUNTER — Telehealth: Payer: Self-pay | Admitting: Family Medicine

## 2014-02-28 NOTE — Telephone Encounter (Signed)
MSG left for clarification of the call.     KP

## 2014-02-28 NOTE — Telephone Encounter (Signed)
Pt requesting a ear patch for sea sickness.

## 2014-02-28 NOTE — Telephone Encounter (Signed)
Transderm scop 1.5 mg patch 1 patch q3d prn,   Start >4 hours before trip #5

## 2014-02-28 NOTE — Telephone Encounter (Signed)
patient is going on a 4 day cruise and would like to get the Transderm patches. Please advise     KP

## 2014-03-01 MED ORDER — SCOPOLAMINE 1 MG/3DAYS TD PT72: 1.0000 | MEDICATED_PATCH | TRANSDERMAL | Status: DC

## 2014-03-01 NOTE — Telephone Encounter (Signed)
Rx sent      KP 

## 2014-03-05 ENCOUNTER — Ambulatory Visit (INDEPENDENT_AMBULATORY_CARE_PROVIDER_SITE_OTHER): Payer: BC Managed Care – PPO | Admitting: Adult Health

## 2014-03-05 ENCOUNTER — Encounter: Payer: Self-pay | Admitting: Adult Health

## 2014-03-05 VITALS — BP 122/80 | HR 71 | Temp 98.1°F | Ht 65.0 in | Wt 208.0 lb

## 2014-03-05 DIAGNOSIS — J069 Acute upper respiratory infection, unspecified: Secondary | ICD-10-CM | POA: Insufficient documentation

## 2014-03-05 MED ORDER — AZITHROMYCIN 250 MG PO TABS: ORAL_TABLET | ORAL | Status: DC

## 2014-03-05 NOTE — Addendum Note (Signed)
Addended by: Parke Poisson E on: 03/05/2014 11:10 AM   Modules accepted: Orders

## 2014-03-05 NOTE — Assessment & Plan Note (Signed)
URI /AR -doubt sinusitis - Will give abx to have on hold as leaving on vacation , explained this is most likely viral /allergic in nature .   Plan  Z-Pak to have on hold if symptoms worsen with discolored mucus. Saline nasal rinses as needed Saline nasal gel-AYR as needed May use Tylenol Cold and sinus(Sudafed) as needed.  Zyrtec 10mg  daily for allergies .  Please contact office for sooner follow up if symptoms do not improve or worsen or seek emergency care  follow up Follow up Dr. Annamaria Boots  As planned and As needed

## 2014-03-05 NOTE — Patient Instructions (Signed)
Z-Pak to have on hold if symptoms worsen with discolored mucus. Saline nasal rinses as needed Saline nasal gel-AYR as needed May use Tylenol Cold and sinus(Sudafed) as needed.  Zyrtec 10mg  daily for allergies .  Please contact office for sooner follow up if symptoms do not improve or worsen or seek emergency care  follow up Follow up Dr. Annamaria Boots  As planned and As needed

## 2014-03-05 NOTE — Progress Notes (Signed)
Patient ID: Ruth [name], female    DOB: 1961/03/11, 53 y.o.   MRN: [id number]  HPI 4/161/2- 72 yoF last seen here 03/18/08 for allergy/ asthma consultation for Dr Etter Sjogren. Note is reviewed. Allergy vaccine helped 8 years ago in [place]. She has not been in control, with itching eyes, head and chest congested. Perennial symptoms. She flared 2 weeks ago without clear change in exposure. 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 She was given singulair, tessalon, Veramyst, augmentin, no prednisone.  Uses Dulera 100 regularly. Still itch and sneeze, itching eyes. Less cough.  09/11/10-  Allergic conjunctivitis/ rhinitis, Asthma  Eyes always itch, still some hoarse, nasal drainage. Denies chest tightness or wheeze taking daily Dulera. Doing better now that Spring pollens have subsided. Taking Veramyst, no eye drops, taking Singulair- not helpful.  Allergy profile - 07/13/10- IgE 768.9, broadly positive Prednisone   03/12/11-53 year old female never smoker followed for Allergic conjunctivitis/ rhinitis, Asthma  Pataday works well for her allergic conjunctivitis. Zyrtec was not effective enough as an antihistamine. Complains of persistent raspiness in her throat, ears itch a lot and she sneezes. Dulera controls her wheezing but she asks if it is the cause of her hoarseness. Omnaris and Veramyst both caused retro-orbital headache.  12/09/11- 53 year old female never smoker followed for Allergic conjunctivitis/ rhinitis, Asthma  No sleep past 3 nights-PND, cough-non-'productive; raspy voice. Blames seasonal allergy over the past week. Skin itches. Some chest tightness and mild wheeze. No sore throat or fever. Has some frontal headache and ears feel full. Nasonex does not help.  11/22/12- 53 year old female never smoker followed for Allergic conjunctivitis/ rhinitis, Asthma FOLLOWS FOR: flare up at this time; flared up asthma as well recently. Has had to use rescue  inhaler again. Some seasonal increased wheeze. Mild nasal congestion and itching of eyes. Out of Pataday, but using Allegra and Dulera.  05/18/13- 53 year old female never smoker followed for Allergic conjunctivitis/ rhinitis, Asthma FOLLOWS FOR:Pt was just on abx about a week or so ago. Continues to have sinus pressure/infections. Prednisone in December for wheeze. Zpak helped 1 week ago from PCP. R retro-orbital HA, occ ear pains. Frequent nasal congestion. Rarely uses rescue inhaler, continues Dulera. Worried about allergy season- avoid shots.  11/27/2013 Acute OV  Complains of right ear congestion, watery eyes, sneezing, clear nasal drainage, PND w/ hoarseness, some tightness in chest and dyspnea x2-3weeks.   Denies f/c/s, n/v/d, hemoptysis, wheezing.  >allergy tx w/ dymista, zyrtec   03/05/2014 Acute OV  Complains of 3 days of  Nasal congestion w/ clear draingae sinus pressure on right and headache. Ears feel full . Had dizziness on first day this resolved. Has been taking zyrtec and flonase with some relief. No cough or fever. No discolored mucus. She denies any chest pain, orthopnea, PND, leg swelling or visual or speech changes. No extremity weakness.  Leaving for cruise on Friday.    Review of Systems See HPI Constitutional:   No-   weight loss, night sweats, fevers, chills, fatigue, lassitude. HEENT:    Neg  tooth/dental problems, sore throat,       +sneezing,+ itching,  +ear ache, +nasal congestion, post nasal drip,  CV:  No-   chest pain, orthopnea, PND, swelling in lower extremities, anasarca,  dizziness, palpitations Resp: No-   shortness of breath with exertion or at rest.              No-   productive cough,  No non-productive  cough,  No- coughing up of blood.              No-   change in color of mucus.   Skin: No-   rash or lesions. GI:  No-   heartburn, indigestion, abdominal pain, nausea, vomiting,  GU:. MS:  No-   joint pain or swelling.   Neuro-     nothing  unusual Psych:  No- change in mood or affect. No depression or anxiety.  No memory loss.  Objective:   Physical Exam General- Alert, Oriented, Affect-appropriate, Distress- none acute Skin- +eczematoid rash on face and arms Lymphadenopathy- none Head- atraumatic            Eyes- Gross vision intact, PERRLA, conjunctivae clear secretions            Ears- Hearing, canals-normal            Nose- Clear, +Septal dev,No- mucus, polyps, erosion, perforation , pale mucosa , nontender sinus             Throat- Mallampati II , mucosa clear , drainage- none, tonsils- atrophic.  Neck- flexible , trachea midline, no stridor , thyroid nl, carotid no bruit Chest - symmetrical excursion , unlabored           Heart/CV- RRR , no murmur , no gallop  , no rub, nl s1 s2                           - JVD- none , edema- none, stasis changes- none, varices- none           Lung- clear to P&A, wheeze- none, cough- none , dullness-none, rub- none           Chest wall-  Abd-  Br/ Gen/ Rectal- Not done, not indicated Extrem- cyanosis- none, clubbing, none, atrophy- none, strength- nl Neuro- grossly intact to observation Skin: few eczema patches noted along torso/arms

## 2014-03-12 ENCOUNTER — Other Ambulatory Visit: Payer: Self-pay | Admitting: Internal Medicine

## 2014-03-12 ENCOUNTER — Telehealth: Payer: Self-pay | Admitting: Internal Medicine

## 2014-03-12 MED ORDER — MOMETASONE FURO-FORMOTEROL FUM 200-5 MCG/ACT IN AERO: INHALATION_SPRAY | RESPIRATORY_TRACT | Status: DC

## 2014-03-12 MED ORDER — PREDNISONE 10 MG PO TABS: ORAL_TABLET | ORAL | Status: DC

## 2014-03-12 NOTE — Telephone Encounter (Signed)
lmtcb for pt.  

## 2014-03-12 NOTE — Telephone Encounter (Signed)
Offer prednisone 10 mg, # 20, 4 X 2 DAYS, 3 X 2 DAYS, 2 X 2 DAYS, 1 X 2 DAYS  

## 2014-03-12 NOTE — Telephone Encounter (Signed)
LMTC x 1  

## 2014-03-12 NOTE — Telephone Encounter (Signed)
Pt returning call.Ruth [name] ° °

## 2014-03-12 NOTE — Telephone Encounter (Signed)
Pred taper sent to pharmacy. Pt aware.  Nothing further needed.

## 2014-03-12 NOTE — Telephone Encounter (Signed)
Spoke with pt. Reports dry cough x10 days. SOB and chest tightness are also present. Denies fever or wheezing. Would like something called in.  No Known Allergies  CY - please advise. Thanks.

## 2014-04-29 LAB — HM PAP SMEAR

## 2014-06-25 ENCOUNTER — Other Ambulatory Visit: Payer: Self-pay | Admitting: Family Medicine

## 2014-10-09 ENCOUNTER — Encounter: Payer: Self-pay | Admitting: *Deleted

## 2014-10-09 ENCOUNTER — Telehealth: Payer: Self-pay | Admitting: *Deleted

## 2014-10-09 NOTE — Telephone Encounter (Signed)
Pre-Visit Call completed with patient and chart updated.   Pre-Visit Info documented in Specialty Comments under [employer].    

## 2014-10-10 ENCOUNTER — Ambulatory Visit (INDEPENDENT_AMBULATORY_CARE_PROVIDER_SITE_OTHER): Payer: 59 | Admitting: Family Medicine

## 2014-10-10 ENCOUNTER — Encounter: Payer: Self-pay | Admitting: Family Medicine

## 2014-10-10 VITALS — BP 146/92 | HR 75 | Temp 98.0°F | Ht 65.0 in | Wt 206.8 lb

## 2014-10-10 DIAGNOSIS — H43393 Other vitreous opacities, bilateral: Secondary | ICD-10-CM

## 2014-10-10 DIAGNOSIS — Z0000 Encounter for general adult medical examination without abnormal findings: Secondary | ICD-10-CM

## 2014-10-10 DIAGNOSIS — I10 Essential (primary) hypertension: Secondary | ICD-10-CM | POA: Diagnosis not present

## 2014-10-10 DIAGNOSIS — J302 Other seasonal allergic rhinitis: Secondary | ICD-10-CM

## 2014-10-10 DIAGNOSIS — Z1231 Encounter for screening mammogram for malignant neoplasm of breast: Secondary | ICD-10-CM

## 2014-10-10 LAB — BASIC METABOLIC PANEL
BUN: 11 mg/dL (ref 6–23)
CO2: 28 mEq/L (ref 19–32)
Calcium: 9 mg/dL (ref 8.4–10.5)
Chloride: 104 mEq/L (ref 96–112)
Creatinine, Ser: 0.82 mg/dL (ref 0.40–1.20)
GFR: 94.3 mL/min (ref >60.00)
Glucose, Bld: 83 mg/dL (ref 70–99)
POTASSIUM: 3.4 meq/L — AB (ref 3.5–5.1)
Sodium: 139 mEq/L (ref 135–145)

## 2014-10-10 LAB — LIPID PANEL
CHOL/HDL RATIO: 2
CHOLESTEROL: 147 mg/dL (ref 0–200)
HDL: 64.7 mg/dL (ref >39.00)
LDL CALC: 70 mg/dL (ref 0–99)
NonHDL: 82.3
Triglycerides: 60 mg/dL (ref 0.0–149.0)
VLDL: 12 mg/dL (ref 0.0–40.0)

## 2014-10-10 LAB — CBC WITH DIFFERENTIAL/PLATELET
BASOS ABS: 0 10*3/uL (ref 0.0–0.1)
Basophils Relative: 0.6 % (ref 0.0–3.0)
Eosinophils Absolute: 0.2 10*3/uL (ref 0.0–0.7)
Eosinophils Relative: 3.8 % (ref 0.0–5.0)
HEMATOCRIT: 41.7 % (ref 36.0–46.0)
HEMOGLOBIN: 13.8 g/dL (ref 12.0–15.0)
LYMPHS PCT: 32.4 % (ref 12.0–46.0)
Lymphs Abs: 1.8 10*3/uL (ref 0.7–4.0)
MCHC: 33.1 g/dL (ref 30.0–36.0)
MCV: 86 fl (ref 78.0–100.0)
MONO ABS: 0.3 10*3/uL (ref 0.1–1.0)
Monocytes Relative: 5.2 % (ref 3.0–12.0)
NEUTROS ABS: 3.3 10*3/uL (ref 1.4–7.7)
Neutrophils Relative %: 58 % (ref 43.0–77.0)
Platelets: 220 10*3/uL (ref 150.0–400.0)
RBC: 4.85 Mil/uL (ref 3.87–5.11)
RDW: 14.6 % (ref 11.5–15.5)
WBC: 5.6 10*3/uL (ref 4.0–10.5)

## 2014-10-10 LAB — HEPATIC FUNCTION PANEL
ALK PHOS: 88 U/L (ref 39–117)
ALT: 14 U/L (ref 0–35)
AST: 18 U/L (ref 0–37)
Albumin: 3.9 g/dL (ref 3.5–5.2)
Bilirubin, Direct: 0.1 mg/dL (ref 0.0–0.3)
TOTAL PROTEIN: 7.2 g/dL (ref 6.0–8.3)
Total Bilirubin: 0.4 mg/dL (ref 0.2–1.2)

## 2014-10-10 LAB — TSH: TSH: 1.26 u[IU]/mL (ref 0.35–4.50)

## 2014-10-10 MED ORDER — LOSARTAN POTASSIUM 50 MG PO TABS: 50.0000 mg | ORAL_TABLET | Freq: Every day | ORAL | Status: DC

## 2014-10-10 MED ORDER — FLUTICASONE PROPIONATE 50 MCG/ACT NA SUSP: 2.0000 | Freq: Every day | NASAL | Status: DC

## 2014-10-10 NOTE — Progress Notes (Signed)
Subjective:     Ruth [name] is a 54 y.o. female and is here for a comprehensive physical exam. The patient reports problems - pt had a dizzy spell on Tuesday -- it also occured a few months ago.  no cp, sob.  History   Social History  . Marital Status: Married    Spouse Name: N/A  . Number of Children: N/A  . Years of Education: N/A   Occupational History  . [employer]     guard    Social History Main Topics  . Smoking status: Never Smoker   . Smokeless tobacco: Never Used  . Alcohol Use: Yes     Comment: rare  . Drug Use: No  . Sexual Activity:    Partners: Male   Other Topics Concern  . Not on file   Social History Narrative   Exercise-- walk, run 2x a week   Health Maintenance  Topic Date Due  . HIV Screening  03/11/1978  . MAMMOGRAM  08/12/2011  . COLONOSCOPY  03/11/2013  . INFLUENZA VACCINE  10/28/2014  . PAP SMEAR  04/29/2017  . TETANUS/TDAP  06/08/2022    The following portions of the patient's history were reviewed and updated as appropriate:  She  has a past medical history of Asthma; Eczema; and Allergic rhinitis, cause unspecified. She  does not have any pertinent problems on file. She  has past surgical history that includes Cesarean section. Her family history includes Allergies in her father and mother; Asthma in her father and mother; Drug abuse in her father. She  reports that she has never smoked. She has never used smokeless tobacco. She reports that she drinks alcohol. She reports that she does not use illicit drugs. She has a current medication list which includes the following prescription(s): albuterol, cetirizine, desonide, epinephrine, fluticasone, mometasone-formoterol, olopatadine hcl, and omeprazole. Current Outpatient Prescriptions on File Prior to Visit  Medication Sig Dispense Refill  . albuterol (PROAIR HFA) 108 (90 BASE) MCG/ACT inhaler Inhale 2 puffs into the lungs every 6 (six) hours as needed.      . cetirizine (ZYRTEC) 10  MG chewable tablet Chew 10 mg by mouth daily. As needed.    . desonide (DESOWEN) 0.05 % lotion Apply to affected area  twice daily 118 mL 1  . EPINEPHrine (EPI-PEN) 0.3 mg/0.3 mL SOAJ injection Inject into upper thigh for severe allergic reaction 1 Device prn  . mometasone-formoterol (DULERA) 200-5 MCG/ACT AERO 2 puffs then rinse mouth, twice daily- maintenance inhaler 1 Inhaler prn  . Olopatadine HCl (PATADAY) 0.2 % SOLN 1 drop in each eye daily 1 Bottle prn  . omeprazole (PRILOSEC) 40 MG capsule Take 1 capsule (40 mg total) by mouth daily. 30 capsule 3   No current facility-administered medications on file prior to visit.   She is allergic to other..  Review of Systems Review of Systems  Constitutional: Negative for activity change, appetite change and fatigue.  HENT: Negative for hearing loss, congestion, tinnitus and ear discharge.  dentist---due Eyes:  + floaters Respiratory: Negative for cough, chest tightness and shortness of breath.   Cardiovascular: Negative for chest pain, palpitations and leg swelling.  Gastrointestinal: Negative for abdominal pain, diarrhea, constipation and abdominal distention.  Genitourinary: Negative for urgency, frequency, decreased urine volume and difficulty urinating.  Musculoskeletal: Negative for back pain, arthralgias and gait problem.  Skin: Negative for color change, pallor and rash.  Neurological:  A few dizzy episodes Hematological: Negative for adenopathy. Does not bruise/bleed easily.  Psychiatric/Behavioral: Negative for suicidal ideas, confusion, sleep disturbance, self-injury, dysphoric mood, decreased concentration and agitation.       Objective:    BP 146/92 mmHg  Pulse 75  Temp(Src) 98 F (36.7 C) (Oral)  Ht 5\' 5"  (1.651 m)  Wt 206 lb 12.8 oz (93.804 kg)  BMI 34.41 kg/m2  SpO2 98%  LMP 09/10/2014 (Within Weeks) General appearance: alert, cooperative, appears stated age and no distress Head: Normocephalic, without obvious  abnormality, atraumatic Eyes: conjunctivae/corneas clear. PERRL, EOM's intact. Fundi benign. Ears: normal TM's and external ear canals both ears Nose: Nares normal. Septum midline. Mucosa normal. No drainage or sinus tenderness. Throat: lips, mucosa, and tongue normal; teeth and gums normal Neck: no adenopathy, no carotid bruit, no JVD, supple, symmetrical, trachea midline and thyroid not enlarged, symmetric, no tenderness/mass/nodules Back: symmetric, no curvature. ROM normal. No CVA tenderness. Lungs: clear to auscultation bilaterally Breasts: gyn Heart: regular rate and rhythm, S1, S2 normal, no murmur, click, rub or gallop Abdomen: soft, non-tender; bowel sounds normal; no masses,  no organomegaly Pelvic: deferred--gyn Extremities: extremities normal, atraumatic, no cyanosis or edema Pulses: 2+ and symmetric Skin: Skin color, texture, turgor normal. No rashes or lesions Lymph nodes: Cervical, supraclavicular, and axillary nodes normal. Neurologic: Alert and oriented X 3, normal strength and tone. Normal symmetric reflexes. Normal coordination and gait Psych- no depression, no anxiety      Assessment:    Healthy female exam.      Plan:     ghm utd Check labs  See After Visit Summary for Counseling Recommendations    1. Encounter for screening mammogram for breast cancer   - MM Digital Screening; Future  2. Floaters in visual field, bilateral   - Ambulatory referral to Ophthalmology  3. Seasonal allergies   - fluticasone (FLONASE) 50 MCG/ACT nasal spray; Place 2 sprays into both nostrils daily.  Dispense: 16 g; Refill: 5  4. Preventative health care See AVS ghm utd  - Basic metabolic panel - CBC with Differential/Platelet - Hepatic function panel - Lipid panel - POCT urinalysis dipstick - TSH - HIV antibody  5. Essential hypertension  Elevated----- start meds - losartan (COZAAR) 50 MG tablet; Take 1 tablet (50 mg total) by mouth daily.  Dispense: 30  tablet; Refill: 2

## 2014-10-10 NOTE — Progress Notes (Signed)
Pre visit review using our clinic review tool, if applicable. No additional management support is needed unless otherwise documented below in the visit note. 

## 2014-10-10 NOTE — Patient Instructions (Signed)
Preventive Care for Adults A healthy lifestyle and preventive care can promote health and wellness. Preventive health guidelines for women include the following key practices.  A routine yearly physical is a good way to check with your health care provider about your health and preventive screening. It is a chance to share any concerns and updates on your health and to receive a thorough exam.  Visit your dentist for a routine exam and preventive care every 6 months. Brush your teeth twice a day and floss once a day. Good oral hygiene prevents tooth decay and gum disease.  The frequency of eye exams is based on your age, health, family medical history, use of contact lenses, and other factors. Follow your health care provider's recommendations for frequency of eye exams.  Eat a healthy diet. Foods like vegetables, fruits, whole grains, low-fat dairy products, and lean protein foods contain the nutrients you need without too many calories. Decrease your intake of foods high in solid fats, added sugars, and salt. Eat the right amount of calories for you.Get information about a proper diet from your health care provider, if necessary.  Regular physical exercise is one of the most important things you can do for your health. Most adults should get at least 150 minutes of moderate-intensity exercise (any activity that increases your heart rate and causes you to sweat) each week. In addition, most adults need muscle-strengthening exercises on 2 or more days a week.  Maintain a healthy weight. The body mass index (BMI) is a screening tool to identify possible weight problems. It provides an estimate of body fat based on height and weight. Your health care provider can find your BMI and can help you achieve or maintain a healthy weight.For adults 20 years and older:  A BMI below 18.5 is considered underweight.  A BMI of 18.5 to 24.9 is normal.  A BMI of 25 to 29.9 is considered overweight.  A BMI of  30 and above is considered obese.  Maintain normal blood lipids and cholesterol levels by exercising and minimizing your intake of saturated fat. Eat a balanced diet with plenty of fruit and vegetables. Blood tests for lipids and cholesterol should begin at age 76 and be repeated every 5 years. If your lipid or cholesterol levels are high, you are over 50, or you are at high risk for heart disease, you may need your cholesterol levels checked more frequently.Ongoing high lipid and cholesterol levels should be treated with medicines if diet and exercise are not working.  If you smoke, find out from your health care provider how to quit. If you do not use tobacco, do not start.  Lung cancer screening is recommended for adults aged 22-80 years who are at high risk for developing lung cancer because of a history of smoking. A yearly low-dose CT scan of the lungs is recommended for people who have at least a 30-pack-year history of smoking and are a current smoker or have quit within the past 15 years. A pack year of smoking is smoking an average of 1 pack of cigarettes a day for 1 year (for example: 1 pack a day for 30 years or 2 packs a day for 15 years). Yearly screening should continue until the smoker has stopped smoking for at least 15 years. Yearly screening should be stopped for people who develop a health problem that would prevent them from having lung cancer treatment.  If you are pregnant, do not drink alcohol. If you are breastfeeding,  be very cautious about drinking alcohol. If you are not pregnant and choose to drink alcohol, do not have more than 1 drink per day. One drink is considered to be 12 ounces (355 mL) of beer, 5 ounces (148 mL) of wine, or 1.5 ounces (44 mL) of liquor.  Avoid use of street drugs. Do not share needles with anyone. Ask for help if you need support or instructions about stopping the use of drugs.  High blood pressure causes heart disease and increases the risk of  stroke. Your blood pressure should be checked at least every 1 to 2 years. Ongoing high blood pressure should be treated with medicines if weight loss and exercise do not work.  If you are 75-52 years old, ask your health care provider if you should take aspirin to prevent strokes.  Diabetes screening involves taking a blood sample to check your fasting blood sugar level. This should be done once every 3 years, after age 15, if you are within normal weight and without risk factors for diabetes. Testing should be considered at a younger age or be carried out more frequently if you are overweight and have at least 1 risk factor for diabetes.  Breast cancer screening is essential preventive care for women. You should practice "breast self-awareness." This means understanding the normal appearance and feel of your breasts and may include breast self-examination. Any changes detected, no matter how small, should be reported to a health care provider. Women in their 58s and 30s should have a clinical breast exam (CBE) by a health care provider as part of a regular health exam every 1 to 3 years. After age 16, women should have a CBE every year. Starting at age 53, women should consider having a mammogram (breast X-ray test) every year. Women who have a family history of breast cancer should talk to their health care provider about genetic screening. Women at a high risk of breast cancer should talk to their health care providers about having an MRI and a mammogram every year.  Breast cancer gene (BRCA)-related cancer risk assessment is recommended for women who have family members with BRCA-related cancers. BRCA-related cancers include breast, ovarian, tubal, and peritoneal cancers. Having family members with these cancers may be associated with an increased risk for harmful changes (mutations) in the breast cancer genes BRCA1 and BRCA2. Results of the assessment will determine the need for genetic counseling and  BRCA1 and BRCA2 testing.  Routine pelvic exams to screen for cancer are no longer recommended for nonpregnant women who are considered low risk for cancer of the pelvic organs (ovaries, uterus, and vagina) and who do not have symptoms. Ask your health care provider if a screening pelvic exam is right for you.  If you have had past treatment for cervical cancer or a condition that could lead to cancer, you need Pap tests and screening for cancer for at least 20 years after your treatment. If Pap tests have been discontinued, your risk factors (such as having a new sexual partner) need to be reassessed to determine if screening should be resumed. Some women have medical problems that increase the chance of getting cervical cancer. In these cases, your health care provider may recommend more frequent screening and Pap tests.  The HPV test is an additional test that may be used for cervical cancer screening. The HPV test looks for the virus that can cause the cell changes on the cervix. The cells collected during the Pap test can be  tested for HPV. The HPV test could be used to screen women aged 30 years and older, and should be used in women of any age who have unclear Pap test results. After the age of 30, women should have HPV testing at the same frequency as a Pap test.  Colorectal cancer can be detected and often prevented. Most routine colorectal cancer screening begins at the age of 50 years and continues through age 75 years. However, your health care provider may recommend screening at an earlier age if you have risk factors for colon cancer. On a yearly basis, your health care provider may provide home test kits to check for hidden blood in the stool. Use of a small camera at the end of a tube, to directly examine the colon (sigmoidoscopy or colonoscopy), can detect the earliest forms of colorectal cancer. Talk to your health care provider about this at age 50, when routine screening begins. Direct  exam of the colon should be repeated every 5-10 years through age 75 years, unless early forms of pre-cancerous polyps or small growths are found.  People who are at an increased risk for hepatitis B should be screened for this virus. You are considered at high risk for hepatitis B if:  You were born in a country where hepatitis B occurs often. Talk with your health care provider about which countries are considered high risk.  Your parents were born in a high-risk country and you have not received a shot to protect against hepatitis B (hepatitis B vaccine).  You have HIV or AIDS.  You use needles to inject street drugs.  You live with, or have sex with, someone who has hepatitis B.  You get hemodialysis treatment.  You take certain medicines for conditions like cancer, organ transplantation, and autoimmune conditions.  Hepatitis C blood testing is recommended for all people born from 1945 through 1965 and any individual with known risks for hepatitis C.  Practice safe sex. Use condoms and avoid high-risk sexual practices to reduce the spread of sexually transmitted infections (STIs). STIs include gonorrhea, chlamydia, syphilis, trichomonas, herpes, HPV, and human immunodeficiency virus (HIV). Herpes, HIV, and HPV are viral illnesses that have no cure. They can result in disability, cancer, and death.  You should be screened for sexually transmitted illnesses (STIs) including gonorrhea and chlamydia if:  You are sexually active and are younger than 24 years.  You are older than 24 years and your health care provider tells you that you are at risk for this type of infection.  Your sexual activity has changed since you were last screened and you are at an increased risk for chlamydia or gonorrhea. Ask your health care provider if you are at risk.  If you are at risk of being infected with HIV, it is recommended that you take a prescription medicine daily to prevent HIV infection. This is  called preexposure prophylaxis (PrEP). You are considered at risk if:  You are a heterosexual woman, are sexually active, and are at increased risk for HIV infection.  You take drugs by injection.  You are sexually active with a partner who has HIV.  Talk with your health care provider about whether you are at high risk of being infected with HIV. If you choose to begin PrEP, you should first be tested for HIV. You should then be tested every 3 months for as long as you are taking PrEP.  Osteoporosis is a disease in which the bones lose minerals and strength   with aging. This can result in serious bone fractures or breaks. The risk of osteoporosis can be identified using a bone density scan. Women ages 65 years and over and women at risk for fractures or osteoporosis should discuss screening with their health care providers. Ask your health care provider whether you should take a calcium supplement or vitamin D to reduce the rate of osteoporosis.  Menopause can be associated with physical symptoms and risks. Hormone replacement therapy is available to decrease symptoms and risks. You should talk to your health care provider about whether hormone replacement therapy is right for you.  Use sunscreen. Apply sunscreen liberally and repeatedly throughout the day. You should seek shade when your shadow is shorter than you. Protect yourself by wearing long sleeves, pants, a wide-brimmed hat, and sunglasses year round, whenever you are outdoors.  Once a month, do a whole body skin exam, using a mirror to look at the skin on your back. Tell your health care provider of new moles, moles that have irregular borders, moles that are larger than a pencil eraser, or moles that have changed in shape or color.  Stay current with required vaccines (immunizations).  Influenza vaccine. All adults should be immunized every year.  Tetanus, diphtheria, and acellular pertussis (Td, Tdap) vaccine. Pregnant women should  receive 1 dose of Tdap vaccine during each pregnancy. The dose should be obtained regardless of the length of time since the last dose. Immunization is preferred during the 27th-36th week of gestation. An adult who has not previously received Tdap or who does not know her vaccine status should receive 1 dose of Tdap. This initial dose should be followed by tetanus and diphtheria toxoids (Td) booster doses every 10 years. Adults with an unknown or incomplete history of completing a 3-dose immunization series with Td-containing vaccines should begin or complete a primary immunization series including a Tdap dose. Adults should receive a Td booster every 10 years.  Varicella vaccine. An adult without evidence of immunity to varicella should receive 2 doses or a second dose if she has previously received 1 dose. Pregnant females who do not have evidence of immunity should receive the first dose after pregnancy. This first dose should be obtained before leaving the health care facility. The second dose should be obtained 4-8 weeks after the first dose.  Human papillomavirus (HPV) vaccine. Females aged 13-26 years who have not received the vaccine previously should obtain the 3-dose series. The vaccine is not recommended for use in pregnant females. However, pregnancy testing is not needed before receiving a dose. If a female is found to be pregnant after receiving a dose, no treatment is needed. In that case, the remaining doses should be delayed until after the pregnancy. Immunization is recommended for any person with an immunocompromised condition through the age of 26 years if she did not get any or all doses earlier. During the 3-dose series, the second dose should be obtained 4-8 weeks after the first dose. The third dose should be obtained 24 weeks after the first dose and 16 weeks after the second dose.  Zoster vaccine. One dose is recommended for adults aged 60 years or older unless certain conditions are  present.  Measles, mumps, and rubella (MMR) vaccine. Adults born before 1957 generally are considered immune to measles and mumps. Adults born in 1957 or later should have 1 or more doses of MMR vaccine unless there is a contraindication to the vaccine or there is laboratory evidence of immunity to   each of the three diseases. A routine second dose of MMR vaccine should be obtained at least 28 days after the first dose for students attending postsecondary schools, health care workers, or international travelers. People who received inactivated measles vaccine or an unknown type of measles vaccine during 1963-1967 should receive 2 doses of MMR vaccine. People who received inactivated mumps vaccine or an unknown type of mumps vaccine before 1979 and are at high risk for mumps infection should consider immunization with 2 doses of MMR vaccine. For females of childbearing age, rubella immunity should be determined. If there is no evidence of immunity, females who are not pregnant should be vaccinated. If there is no evidence of immunity, females who are pregnant should delay immunization until after pregnancy. Unvaccinated health care workers born before 1957 who lack laboratory evidence of measles, mumps, or rubella immunity or laboratory confirmation of disease should consider measles and mumps immunization with 2 doses of MMR vaccine or rubella immunization with 1 dose of MMR vaccine.  Pneumococcal 13-valent conjugate (PCV13) vaccine. When indicated, a person who is uncertain of her immunization history and has no record of immunization should receive the PCV13 vaccine. An adult aged 19 years or older who has certain medical conditions and has not been previously immunized should receive 1 dose of PCV13 vaccine. This PCV13 should be followed with a dose of pneumococcal polysaccharide (PPSV23) vaccine. The PPSV23 vaccine dose should be obtained at least 8 weeks after the dose of PCV13 vaccine. An adult aged 19  years or older who has certain medical conditions and previously received 1 or more doses of PPSV23 vaccine should receive 1 dose of PCV13. The PCV13 vaccine dose should be obtained 1 or more years after the last PPSV23 vaccine dose.  Pneumococcal polysaccharide (PPSV23) vaccine. When PCV13 is also indicated, PCV13 should be obtained first. All adults aged 65 years and older should be immunized. An adult younger than age 65 years who has certain medical conditions should be immunized. Any person who resides in a nursing home or long-term care facility should be immunized. An adult smoker should be immunized. People with an immunocompromised condition and certain other conditions should receive both PCV13 and PPSV23 vaccines. People with human immunodeficiency virus (HIV) infection should be immunized as soon as possible after diagnosis. Immunization during chemotherapy or radiation therapy should be avoided. Routine use of PPSV23 vaccine is not recommended for American Indians, [place] Natives, or people younger than 65 years unless there are medical conditions that require PPSV23 vaccine. When indicated, people who have unknown immunization and have no record of immunization should receive PPSV23 vaccine. One-time revaccination 5 years after the first dose of PPSV23 is recommended for people aged 19-64 years who have chronic kidney failure, nephrotic syndrome, asplenia, or immunocompromised conditions. People who received 1-2 doses of PPSV23 before age 65 years should receive another dose of PPSV23 vaccine at age 65 years or later if at least 5 years have passed since the previous dose. Doses of PPSV23 are not needed for people immunized with PPSV23 at or after age 65 years.  Meningococcal vaccine. Adults with asplenia or persistent complement component deficiencies should receive 2 doses of quadrivalent meningococcal conjugate (MenACWY-D) vaccine. The doses should be obtained at least 2 months apart.  Microbiologists working with certain meningococcal bacteria, military recruits, people at risk during an outbreak, and people who travel to or live in countries with a high rate of meningitis should be immunized. A first-year college student up through age   21 years who is living in a residence hall should receive a dose if she did not receive a dose on or after her 16th birthday. Adults who have certain high-risk conditions should receive one or more doses of vaccine.  Hepatitis A vaccine. Adults who wish to be protected from this disease, have certain high-risk conditions, work with hepatitis A-infected animals, work in hepatitis A research labs, or travel to or work in countries with a high rate of hepatitis A should be immunized. Adults who were previously unvaccinated and who anticipate close contact with an international adoptee during the first 60 days after arrival in the [place] from a country with a high rate of hepatitis A should be immunized.  Hepatitis B vaccine. Adults who wish to be protected from this disease, have certain high-risk conditions, may be exposed to blood or other infectious body fluids, are household contacts or sex partners of hepatitis B positive people, are clients or workers in certain care facilities, or travel to or work in countries with a high rate of hepatitis B should be immunized.  Haemophilus influenzae type b (Hib) vaccine. A previously unvaccinated person with asplenia or sickle cell disease or having a scheduled splenectomy should receive 1 dose of Hib vaccine. Regardless of previous immunization, a recipient of a hematopoietic stem cell transplant should receive a 3-dose series 6-12 months after her successful transplant. Hib vaccine is not recommended for adults with HIV infection. Preventive Services / Frequency Ages 64 to 68 years  Blood pressure check.** / Every 1 to 2 years.  Lipid and cholesterol check.** / Every 5 years beginning at age  22.  Clinical breast exam.** / Every 3 years for women in their 88s and 53s.  BRCA-related cancer risk assessment.** / For women who have family members with a BRCA-related cancer (breast, ovarian, tubal, or peritoneal cancers).  Pap test.** / Every 2 years from ages 90 through 51. Every 3 years starting at age 21 through age 56 or 3 with a history of 3 consecutive normal Pap tests.  HPV screening.** / Every 3 years from ages 24 through ages 1 to 46 with a history of 3 consecutive normal Pap tests.  Hepatitis C blood test.** / For any individual with known risks for hepatitis C.  Skin self-exam. / Monthly.  Influenza vaccine. / Every year.  Tetanus, diphtheria, and acellular pertussis (Tdap, Td) vaccine.** / Consult your health care provider. Pregnant women should receive 1 dose of Tdap vaccine during each pregnancy. 1 dose of Td every 10 years.  Varicella vaccine.** / Consult your health care provider. Pregnant females who do not have evidence of immunity should receive the first dose after pregnancy.  HPV vaccine. / 3 doses over 6 months, if 72 and younger. The vaccine is not recommended for use in pregnant females. However, pregnancy testing is not needed before receiving a dose.  Measles, mumps, rubella (MMR) vaccine.** / You need at least 1 dose of MMR if you were born in 1957 or later. You may also need a 2nd dose. For females of childbearing age, rubella immunity should be determined. If there is no evidence of immunity, females who are not pregnant should be vaccinated. If there is no evidence of immunity, females who are pregnant should delay immunization until after pregnancy.  Pneumococcal 13-valent conjugate (PCV13) vaccine.** / Consult your health care provider.  Pneumococcal polysaccharide (PPSV23) vaccine.** / 1 to 2 doses if you smoke cigarettes or if you have certain conditions.  Meningococcal vaccine.** /  1 dose if you are age 19 to 21 years and a first-year college  student living in a residence hall, or have one of several medical conditions, you need to get vaccinated against meningococcal disease. You may also need additional booster doses.  Hepatitis A vaccine.** / Consult your health care provider.  Hepatitis B vaccine.** / Consult your health care provider.  Haemophilus influenzae type b (Hib) vaccine.** / Consult your health care provider. Ages 40 to 64 years  Blood pressure check.** / Every 1 to 2 years.  Lipid and cholesterol check.** / Every 5 years beginning at age 20 years.  Lung cancer screening. / Every year if you are aged 55-80 years and have a 30-pack-year history of smoking and currently smoke or have quit within the past 15 years. Yearly screening is stopped once you have quit smoking for at least 15 years or develop a health problem that would prevent you from having lung cancer treatment.  Clinical breast exam.** / Every year after age 40 years.  BRCA-related cancer risk assessment.** / For women who have family members with a BRCA-related cancer (breast, ovarian, tubal, or peritoneal cancers).  Mammogram.** / Every year beginning at age 40 years and continuing for as long as you are in good health. Consult with your health care provider.  Pap test.** / Every 3 years starting at age 30 years through age 65 or 70 years with a history of 3 consecutive normal Pap tests.  HPV screening.** / Every 3 years from ages 30 years through ages 65 to 70 years with a history of 3 consecutive normal Pap tests.  Fecal occult blood test (FOBT) of stool. / Every year beginning at age 50 years and continuing until age 75 years. You may not need to do this test if you get a colonoscopy every 10 years.  Flexible sigmoidoscopy or colonoscopy.** / Every 5 years for a flexible sigmoidoscopy or every 10 years for a colonoscopy beginning at age 50 years and continuing until age 75 years.  Hepatitis C blood test.** / For all people born from 1945 through  1965 and any individual with known risks for hepatitis C.  Skin self-exam. / Monthly.  Influenza vaccine. / Every year.  Tetanus, diphtheria, and acellular pertussis (Tdap/Td) vaccine.** / Consult your health care provider. Pregnant women should receive 1 dose of Tdap vaccine during each pregnancy. 1 dose of Td every 10 years.  Varicella vaccine.** / Consult your health care provider. Pregnant females who do not have evidence of immunity should receive the first dose after pregnancy.  Zoster vaccine.** / 1 dose for adults aged 60 years or older.  Measles, mumps, rubella (MMR) vaccine.** / You need at least 1 dose of MMR if you were born in 1957 or later. You may also need a 2nd dose. For females of childbearing age, rubella immunity should be determined. If there is no evidence of immunity, females who are not pregnant should be vaccinated. If there is no evidence of immunity, females who are pregnant should delay immunization until after pregnancy.  Pneumococcal 13-valent conjugate (PCV13) vaccine.** / Consult your health care provider.  Pneumococcal polysaccharide (PPSV23) vaccine.** / 1 to 2 doses if you smoke cigarettes or if you have certain conditions.  Meningococcal vaccine.** / Consult your health care provider.  Hepatitis A vaccine.** / Consult your health care provider.  Hepatitis B vaccine.** / Consult your health care provider.  Haemophilus influenzae type b (Hib) vaccine.** / Consult your health care provider. Ages 65   years and over  Blood pressure check.** / Every 1 to 2 years.  Lipid and cholesterol check.** / Every 5 years beginning at age 22 years.  Lung cancer screening. / Every year if you are aged 73-80 years and have a 30-pack-year history of smoking and currently smoke or have quit within the past 15 years. Yearly screening is stopped once you have quit smoking for at least 15 years or develop a health problem that would prevent you from having lung cancer  treatment.  Clinical breast exam.** / Every year after age 4 years.  BRCA-related cancer risk assessment.** / For women who have family members with a BRCA-related cancer (breast, ovarian, tubal, or peritoneal cancers).  Mammogram.** / Every year beginning at age 40 years and continuing for as long as you are in good health. Consult with your health care provider.  Pap test.** / Every 3 years starting at age 9 years through age 34 or 91 years with 3 consecutive normal Pap tests. Testing can be stopped between 65 and 70 years with 3 consecutive normal Pap tests and no abnormal Pap or HPV tests in the past 10 years.  HPV screening.** / Every 3 years from ages 57 years through ages 64 or 45 years with a history of 3 consecutive normal Pap tests. Testing can be stopped between 65 and 70 years with 3 consecutive normal Pap tests and no abnormal Pap or HPV tests in the past 10 years.  Fecal occult blood test (FOBT) of stool. / Every year beginning at age 15 years and continuing until age 17 years. You may not need to do this test if you get a colonoscopy every 10 years.  Flexible sigmoidoscopy or colonoscopy.** / Every 5 years for a flexible sigmoidoscopy or every 10 years for a colonoscopy beginning at age 86 years and continuing until age 71 years.  Hepatitis C blood test.** / For all people born from [birth year] through 1963 and any individual with known risks for hepatitis C.  Osteoporosis screening.** / A one-time screening for women ages 83 years and over and women at risk for fractures or osteoporosis.  Skin self-exam. / Monthly.  Influenza vaccine. / Every year.  Tetanus, diphtheria, and acellular pertussis (Tdap/Td) vaccine.** / 1 dose of Td every 10 years.  Varicella vaccine.** / Consult your health care provider.  Zoster vaccine.** / 1 dose for adults aged 61 years or older.  Pneumococcal 13-valent conjugate (PCV13) vaccine.** / Consult your health care provider.  Pneumococcal  polysaccharide (PPSV23) vaccine.** / 1 dose for all adults aged 28 years and older.  Meningococcal vaccine.** / Consult your health care provider.  Hepatitis A vaccine.** / Consult your health care provider.  Hepatitis B vaccine.** / Consult your health care provider.  Haemophilus influenzae type b (Hib) vaccine.** / Consult your health care provider. ** Family history and personal history of risk and conditions may change your health care provider's recommendations. Document Released: 05/11/2001 Document Revised: 07/30/2013 Document Reviewed: 08/10/2010 [facility] Patient Information 2015 [place], [place]. This information is not intended to replace advice given to you by your health care provider. Make sure you discuss any questions you have with your health care provider.

## 2014-10-11 LAB — POCT URINALYSIS DIPSTICK
BILIRUBIN UA: NEGATIVE
Blood, UA: NEGATIVE
GLUCOSE UA: NEGATIVE
Ketones, UA: NEGATIVE
Leukocytes, UA: NEGATIVE
Nitrite, UA: NEGATIVE
PROTEIN UA: NEGATIVE
Spec Grav, UA: 1.015
UROBILINOGEN UA: 0.2
pH, UA: 8

## 2014-10-11 LAB — HIV ANTIBODY (ROUTINE TESTING W REFLEX): HIV: NONREACTIVE

## 2014-10-18 ENCOUNTER — Ambulatory Visit (HOSPITAL_BASED_OUTPATIENT_CLINIC_OR_DEPARTMENT_OTHER)
Admission: RE | Admit: 2014-10-18 | Discharge: 2014-10-18 | Disposition: A | Discharge status: Home / Self Care | Payer: 59 | Source: Ambulatory Visit | Attending: Family Medicine | Admitting: Family Medicine

## 2014-10-18 DIAGNOSIS — Z1231 Encounter for screening mammogram for malignant neoplasm of breast: Secondary | ICD-10-CM | POA: Diagnosis not present

## 2014-10-26 ENCOUNTER — Encounter: Payer: Self-pay | Admitting: Family Medicine

## 2014-10-26 DIAGNOSIS — L309 Dermatitis, unspecified: Secondary | ICD-10-CM

## 2014-10-28 ENCOUNTER — Encounter: Payer: Self-pay | Admitting: Family Medicine

## 2014-10-28 ENCOUNTER — Other Ambulatory Visit: Payer: Self-pay

## 2014-10-28 DIAGNOSIS — R42 Dizziness and giddiness: Secondary | ICD-10-CM

## 2014-10-28 NOTE — Addendum Note (Signed)
Addended by: Ewing Schlein on: 10/28/2014 11:11 AM   Modules accepted: Orders

## 2014-10-28 NOTE — Telephone Encounter (Signed)
Lab pretty much normal---  Refer to neuro for dizziness

## 2014-10-28 NOTE — Telephone Encounter (Signed)
To MD to review and advise      KP 

## 2014-11-16 ENCOUNTER — Encounter: Payer: Self-pay | Admitting: Family Medicine

## 2015-01-08 ENCOUNTER — Ambulatory Visit: Payer: 59 | Admitting: Neurology

## 2015-01-28 ENCOUNTER — Telehealth: Payer: Self-pay | Admitting: Internal Medicine

## 2015-01-28 NOTE — Telephone Encounter (Signed)
Patient calling regarding cough, slightly productive, breathing is fine, hoarseness. Patient has been taking Zyrtec and Flonase for allergies and Dulera, but doesn't know what else to take.  Allergies  Allergen Reactions  . Other     Nuts    Current Outpatient Prescriptions on File Prior to Visit  Medication Sig Dispense Refill  . albuterol (PROAIR HFA) 108 (90 BASE) MCG/ACT inhaler Inhale 2 puffs into the lungs every 6 (six) hours as needed.      . cetirizine (ZYRTEC) 10 MG chewable tablet Chew 10 mg by mouth daily. As needed.    . desonide (DESOWEN) 0.05 % lotion Apply to affected area  twice daily 118 mL 1  . EPINEPHrine (EPI-PEN) 0.3 mg/0.3 mL SOAJ injection Inject into upper thigh for severe allergic reaction 1 Device prn  . fluticasone (FLONASE) 50 MCG/ACT nasal spray Place 2 sprays into both nostrils daily. 16 g 5  . losartan (COZAAR) 50 MG tablet Take 1 tablet (50 mg total) by mouth daily. 30 tablet 2  . mometasone-formoterol (DULERA) 200-5 MCG/ACT AERO 2 puffs then rinse mouth, twice daily- maintenance inhaler 1 Inhaler prn  . Olopatadine HCl (PATADAY) 0.2 % SOLN 1 drop in each eye daily 1 Bottle prn  . omeprazole (PRILOSEC) 40 MG capsule Take 1 capsule (40 mg total) by mouth daily. 30 capsule 3   No current facility-administered medications on file prior to visit.   Per Dr. Annamaria Boots: Try OTC Delsym and frequent sips of liquid  Patient notified of Dr. Janee Morn recommendations. Nothing further needed. Closing encounter

## 2015-02-12 ENCOUNTER — Encounter: Payer: Self-pay | Admitting: Neurology

## 2015-02-12 ENCOUNTER — Ambulatory Visit (INDEPENDENT_AMBULATORY_CARE_PROVIDER_SITE_OTHER): Payer: 59 | Admitting: Neurology

## 2015-02-12 VITALS — BP 130/86 | HR 82 | Ht 65.0 in | Wt 210.0 lb

## 2015-02-12 DIAGNOSIS — R42 Dizziness and giddiness: Secondary | ICD-10-CM | POA: Diagnosis not present

## 2015-02-12 DIAGNOSIS — R51 Headache: Secondary | ICD-10-CM

## 2015-02-12 DIAGNOSIS — G43709 Chronic migraine without aura, not intractable, without status migrainosus: Secondary | ICD-10-CM

## 2015-02-12 DIAGNOSIS — R519 Headache, unspecified: Secondary | ICD-10-CM

## 2015-02-12 MED ORDER — TOPIRAMATE 25 MG PO TABS: ORAL_TABLET | ORAL | Status: DC

## 2015-02-12 MED ORDER — MECLIZINE HCL 12.5 MG PO TABS: 12.5000 mg | ORAL_TABLET | Freq: Three times a day (TID) | ORAL | Status: DC | PRN

## 2015-02-12 MED ORDER — PREDNISONE 10 MG PO TABS: ORAL_TABLET | ORAL | Status: DC

## 2015-02-12 NOTE — Patient Instructions (Signed)
Migraine Recommendations: 1.  Start topiramate 25mg  tablets.  Take 1 tablet at bedtime for 7 days, then 2 tablets at bedtime.  Call in 4 weeks with update and we can adjust dose if needed. We will start topiramate (Topamax) 50mg  tablets.  We will increase the dose as follows to goal of 100mg  twice daily:      Morning Evening Week 1:   0.5 tab  0.5 tab Week 2:    1 tab  1 tab Week 3:   1.5 tabs 1.5 tabs Week 4 and thereafter 2 tabs  2 tabs.  Possible side effects include: impaired thinking, sedation, paresthesias (numbness and tingling) and weight loss.  It may cause dehydration and there is a small risk for kidney stones, so make sure to stay hydrated with water during the day.  There is also a very small risk for glaucoma, so if you notice any change in your vision while taking this medication, see an ophthalmologis 2.  To help break headache, take prednisone taper (Take 6tabs x1day, then 5tabs x1day, then 4tabs x1day, then 3tabs x1day, then 2tabs x1day, then 1tab x1day, then STOP).  TAKE WITH FOOD.  Do not take Advil or Aleve while on the prednisone.  3.  You may take Excedrin.  Limit use of pain relievers to no more than 2 days out of the week.  These medications include acetaminophen, ibuprofen, triptans and narcotics.  This will help reduce risk of rebound headaches. 4.  Be aware of common food triggers such as processed sweets, processed foods with nitrites (such as deli meat, hot dogs, sausages), foods with MSG, alcohol (such as wine), chocolate, certain cheeses, certain fruits (dried fruits, some citrus fruit), vinegar, diet soda. 4.  Avoid caffeine 5.  Routine exercise 6.  Proper sleep hygiene 7.  Stay adequately hydrated with water 8.  Keep a headache diary. 9.  Maintain proper stress management. 10.  Do not skip meals. 11.  Consider supplements:  Magnesium oxide 400mg  to 600mg  daily, riboflavin 400mg , Coenzyme Q 10 100mg  three times daily 12.  For dizzy spells, may take meclizine  12.5mg  three times daily as needed.  Only take if needed and don't overuse it. 13.  Will get MRI of brain 14.  CALL IN 4 WEEKS WITH UPDATE.  Follow up in 3 months.

## 2015-02-12 NOTE — Progress Notes (Signed)
NEUROLOGY CONSULTATION NOTE  MOO HOLZMANN MRN: [id number] DOB: [birth year]  Referring provider: Dr. Etter Sjogren Primary care provider: Dr. Etter Sjogren  Reason for consult:  dizziness  HISTORY OF PRESENT ILLNESS: Ruth [name] is a 54 year old right-handed female with allergies who presents for dizziness and headache.  She has longstanding history of headaches since young adulthood, which she says have been attributed to allergies and sinus issues.  Over the past few months, she reports different type of headache.  They are right sided, throbbing/pressure and 8/10 intensity.  They last all day and occur daily.  It is associated with nausea, photophobia, phonophobia and osmophobia.  She also reported seeing floaters but her eye doctor said it was normal for her age.  She also feels a little dizzy.  She takes Advil or Tylenol almost daily, with variable effect.  She denies family history of headache.  Over the past 6 months, she has had 3 episodes of severe dizziness, not associated with headache.  It is not spinning but it is a sense of movement and feeling off-balance.  There is mild nausea but no vomiting.  It occurs spontaneously throughout the day and each episode can last up to 4 hours non-stop.  She notes some bilateral tinnitus.  Labs, including CBC, BMP, LFTs, TSH and HIV, were unremarkable.  PAST MEDICAL HISTORY: Past Medical History  Diagnosis Date  . Asthma   . Eczema   . Allergic rhinitis, cause unspecified     PAST SURGICAL HISTORY: Past Surgical History  Procedure Laterality Date  . Cesarean section    . Cesarean section Bilateral [id number]    MEDICATIONS: Current Outpatient Prescriptions on File Prior to Visit  Medication Sig Dispense Refill  . albuterol (PROAIR HFA) 108 (90 BASE) MCG/ACT inhaler Inhale 2 puffs into the lungs every 6 (six) hours as needed.      . cetirizine (ZYRTEC) 10 MG chewable tablet Chew 10 mg by mouth daily. As needed.    . desonide (DESOWEN) 0.05  % lotion Apply to affected area  twice daily 118 mL 1  . EPINEPHrine (EPI-PEN) 0.3 mg/0.3 mL SOAJ injection Inject into upper thigh for severe allergic reaction 1 Device prn  . fluticasone (FLONASE) 50 MCG/ACT nasal spray Place 2 sprays into both nostrils daily. 16 g 5  . losartan (COZAAR) 50 MG tablet Take 1 tablet (50 mg total) by mouth daily. 30 tablet 2  . mometasone-formoterol (DULERA) 200-5 MCG/ACT AERO 2 puffs then rinse mouth, twice daily- maintenance inhaler 1 Inhaler prn  . omeprazole (PRILOSEC) 40 MG capsule Take 1 capsule (40 mg total) by mouth daily. 30 capsule 3   No current facility-administered medications on file prior to visit.    ALLERGIES: Allergies  Allergen Reactions  . Other     Nuts     FAMILY HISTORY: Family History  Problem Relation Age of Onset  . Asthma Mother   . Allergies Mother   . Asthma Father   . Drug abuse Father   . Allergies Father     SOCIAL HISTORY: Social History   Social History  . Marital Status: Married    Spouse Name: N/A  . Number of Children: N/A  . Years of Education: N/A   Occupational History  . [employer]     guard    Social History Main Topics  . Smoking status: Never Smoker   . Smokeless tobacco: Never Used  . Alcohol Use: Yes     Comment: rare  . Drug Use:  No  . Sexual Activity:    Partners: Male   Other Topics Concern  . Not on file   Social History Narrative   Exercise-- walk, run 2x a week. Masters degree. Pt lives alone in a home with stairs.     REVIEW OF SYSTEMS: Constitutional: No fevers, chills, or sweats, no generalized fatigue, change in appetite Eyes: No visual changes, double vision, eye pain Ear, nose and throat: No hearing loss, ear pain, nasal congestion, sore throat Cardiovascular: No chest pain, palpitations Respiratory:  No shortness of breath at rest or with exertion, wheezes GastrointestinaI: No nausea, vomiting, diarrhea, abdominal pain, fecal incontinence Genitourinary:  No  dysuria, urinary retention or frequency Musculoskeletal:  No neck pain, back pain Integumentary: No rash, pruritus, skin lesions Neurological: as above Psychiatric: No depression, insomnia, anxiety Endocrine: No palpitations, fatigue, diaphoresis, mood swings, change in appetite, change in weight, increased thirst Hematologic/Lymphatic:  No anemia, purpura, petechiae. Allergic/Immunologic: no itchy/runny eyes, nasal congestion, recent allergic reactions, rashes  PHYSICAL EXAM: Filed Vitals:   02/12/15 0800  BP: 130/86  Pulse: 82   General: No acute distress.  Patient appears well-groomed.  Head:  Normocephalic/atraumatic Eyes:  fundi unremarkable, without vessel changes, exudates, hemorrhages or papilledema. Neck: supple, no paraspinal tenderness, full range of motion Back: No paraspinal tenderness Heart: regular rate and rhythm Lungs: Clear to auscultation bilaterally. Vascular: No carotid bruits. Neurological Exam: Mental status: alert and oriented to person, place, and time, recent and remote memory intact, fund of knowledge intact, attention and concentration intact, speech fluent and not dysarthric, language intact. Cranial nerves: CN I: not tested CN II: pupils equal, round and reactive to light, visual fields intact, fundi unremarkable, without vessel changes, exudates, hemorrhages or papilledema. CN III, IV, VI:  full range of motion, no nystagmus, no ptosis CN V: facial sensation intact CN VII: upper and lower face symmetric CN VIII: hearing intact CN IX, X: gag intact, uvula midline CN XI: sternocleidomastoid and trapezius muscles intact CN XII: tongue midline Bulk & Tone: normal, no fasciculations. Motor:  5/5 throughout Sensation: temperature and vibration sensation intact. . Deep Tendon Reflexes:  2+ throughout, toes downgoing.  Finger to nose testing:  Without dysmetria.  Heel to shin:  Without dysmetria.  Gait:  Normal station and stride.  Able to turn and  tandem walk. Romberg negative.  IMPRESSION: New onset headaches, probably chronic migraine.  Prior "sinus headaches" may actually have been migraine Vertigo, possibly migraine-associated vertigo  PLAN: 1.  Will start topiramate 50mg  at bedtime 2.  Advised to stop Advil and Tylenol.   3.  Prednisone taper 4.  After taper, use Excedrin Migraine, limiting to no more than 2 days out of the week. 5.  Since these are new headaches in patient over 50, and with new onset prolonged vertigo, will get MRI of brain 6.  She is to call in 4 weeks with update, follow up in 3 months.  Thank you for allowing me to take part in the care of this patient.  Metta Clines, DO  CC:  Garnet Koyanagi, DO

## 2015-02-13 ENCOUNTER — Telehealth: Payer: Self-pay

## 2015-02-13 DIAGNOSIS — G4452 New daily persistent headache (NDPH): Secondary | ICD-10-CM

## 2015-02-13 DIAGNOSIS — H8110 Benign paroxysmal vertigo, unspecified ear: Secondary | ICD-10-CM

## 2015-02-13 NOTE — Telephone Encounter (Signed)
okay

## 2015-02-13 NOTE — Telephone Encounter (Signed)
MRI brain w/o not going to be approved per insurance. MRI w and w/o will. Okay to switch?

## 2015-02-22 ENCOUNTER — Inpatient Hospital Stay: Admission: RE | Admit: 2015-02-22 | Payer: 59 | Source: Ambulatory Visit

## 2015-03-03 ENCOUNTER — Inpatient Hospital Stay: Admission: RE | Admit: 2015-03-03 | Payer: 59 | Source: Ambulatory Visit

## 2015-03-11 ENCOUNTER — Ambulatory Visit
Admission: RE | Admit: 2015-03-11 | Discharge: 2015-03-11 | Disposition: A | Discharge status: Home / Self Care | Payer: 59 | Source: Ambulatory Visit | Attending: Neurology | Admitting: Neurology

## 2015-03-11 DIAGNOSIS — H8110 Benign paroxysmal vertigo, unspecified ear: Secondary | ICD-10-CM

## 2015-03-11 DIAGNOSIS — G4452 New daily persistent headache (NDPH): Secondary | ICD-10-CM

## 2015-03-11 MED ORDER — GADOBENATE DIMEGLUMINE 529 MG/ML IV SOLN
20.0000 mL | Freq: Once | INTRAVENOUS | Status: AC | PRN
  Administered 2015-03-11: 18 mL via INTRAVENOUS

## 2015-03-12 ENCOUNTER — Telehealth: Payer: Self-pay | Admitting: Neurology

## 2015-03-12 NOTE — Telephone Encounter (Signed)
Called patient to discuss MRI of brain results.  Voice Mailbox is full and I cannot leave a message.  Will try later.

## 2015-03-14 ENCOUNTER — Telehealth: Payer: Self-pay | Admitting: Neurology

## 2015-03-14 DIAGNOSIS — R9089 Other abnormal findings on diagnostic imaging of central nervous system: Secondary | ICD-10-CM

## 2015-03-14 MED ORDER — SIMVASTATIN 10 MG PO TABS: 10.0000 mg | ORAL_TABLET | Freq: Every day | ORAL | Status: DC

## 2015-03-14 NOTE — Addendum Note (Signed)
Addended by: Gerda Diss A on: 03/14/2015 08:39 AM   Modules accepted: Orders

## 2015-03-14 NOTE — Telephone Encounter (Signed)
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  I would like to get an MRA of the head.  I will place a prescription for simvastatin 10mg  daily.  I also asked that she take ASA EC 81mg  daily.  She will schedule a follow up.  I answered all questions to the best of my ability.

## 2015-03-14 NOTE — Telephone Encounter (Signed)
Message relayed to patient. Verbalized understanding and denied questions.   

## 2015-03-14 NOTE — Telephone Encounter (Signed)
MRA scheduled for 12/27 @ 1:30 at [address].

## 2015-03-25 ENCOUNTER — Inpatient Hospital Stay: Admission: RE | Admit: 2015-03-25 | Payer: 59 | Source: Ambulatory Visit

## 2015-03-30 ENCOUNTER — Other Ambulatory Visit: Payer: 59

## 2015-04-02 ENCOUNTER — Telehealth: Payer: Self-pay | Admitting: Neurology

## 2015-04-02 NOTE — Telephone Encounter (Signed)
Other than aspirin and simvastatin, there is nothing else I would do (other than diet and exercise)

## 2015-04-02 NOTE — Telephone Encounter (Signed)
Pt has some questions about MRA please call [phone]

## 2015-04-02 NOTE — Telephone Encounter (Signed)
Spoke with patient. Just wanting to know if there was anything other than baby aspirin that she should be doing for the "stroke" that was found on MRI. Pt is taking 81 mg of aspirin daily. Was wondering if she should be mindful of driving, exercising, etc. Patient was told to work on diet and exercise to help lower BP and cholesterol. Please advise.

## 2015-04-04 ENCOUNTER — Telehealth: Payer: Self-pay

## 2015-04-04 ENCOUNTER — Ambulatory Visit
Admission: RE | Admit: 2015-04-04 | Discharge: 2015-04-04 | Disposition: A | Discharge status: Home / Self Care | Payer: 59 | Source: Ambulatory Visit | Attending: Neurology | Admitting: Neurology

## 2015-04-04 DIAGNOSIS — R9089 Other abnormal findings on diagnostic imaging of central nervous system: Secondary | ICD-10-CM

## 2015-04-04 NOTE — Telephone Encounter (Signed)
-----   Message from Pieter Partridge, DO sent at 04/04/2015 12:10 PM EST ----- MRA of head does not reveal any blockage of blood vessels which may have contributed to stroke.  There is evidence of a possible tiny aneurysm.  To verify this, I would like to get a CTA of the head, which is better at visualizing the blood vessels.  This may not be an aneurysm, but even if it is, she should not be concerned because it is an incidental finding and it is very small.

## 2015-04-04 NOTE — Telephone Encounter (Signed)
Message relayed to patient. Verbalized understanding and denied questions. Order placed.  

## 2015-04-10 ENCOUNTER — Other Ambulatory Visit: Payer: 59

## 2015-04-11 ENCOUNTER — Other Ambulatory Visit: Payer: Self-pay | Admitting: Internal Medicine

## 2015-04-16 ENCOUNTER — Ambulatory Visit
Admission: RE | Admit: 2015-04-16 | Discharge: 2015-04-16 | Disposition: A | Discharge status: Home / Self Care | Payer: 59 | Source: Ambulatory Visit | Attending: Neurology | Admitting: Neurology

## 2015-04-16 ENCOUNTER — Telehealth: Payer: Self-pay | Admitting: Family Medicine

## 2015-04-16 ENCOUNTER — Telehealth: Payer: Self-pay

## 2015-04-16 DIAGNOSIS — R930 Abnormal findings on diagnostic imaging of skull and head, not elsewhere classified: Secondary | ICD-10-CM

## 2015-04-16 DIAGNOSIS — R9089 Other abnormal findings on diagnostic imaging of central nervous system: Secondary | ICD-10-CM

## 2015-04-16 MED ORDER — IOPAMIDOL (ISOVUE-370) INJECTION 76%
100.0000 mL | Freq: Once | INTRAVENOUS | Status: AC | PRN
  Administered 2015-04-16: 100 mL via INTRAVENOUS

## 2015-04-16 NOTE — Telephone Encounter (Signed)
Pt is requesting referral to another neurology office. She said Dr. Tomi Likens gave her info by phone and they don't have appts open for her to go in and talk to him. She has a lot of questions about results she's been given. She would like to go to another neurology office for a 2nd opinion and how to proceed. She does not want it to be the same office as Dr. Tomi Likens.   Pt# [phone]

## 2015-04-16 NOTE — Telephone Encounter (Signed)
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  I would recommend repeating CTA of head in one year to monitor for any change.

## 2015-04-16 NOTE — Telephone Encounter (Signed)
Left message on machine for pt to return call to the office.  

## 2015-04-17 NOTE — Telephone Encounter (Signed)
Ok to refer for second opinion

## 2015-04-17 NOTE — Telephone Encounter (Signed)
Ref placed and the patient is aware.     KP

## 2015-04-17 NOTE — Telephone Encounter (Signed)
Please advise      KP 

## 2015-05-06 ENCOUNTER — Encounter: Payer: Self-pay | Admitting: Neurology

## 2015-05-06 ENCOUNTER — Ambulatory Visit (INDEPENDENT_AMBULATORY_CARE_PROVIDER_SITE_OTHER): Payer: 59 | Admitting: Neurology

## 2015-05-06 VITALS — BP 146/96 | HR 70 | Resp 16 | Ht 65.0 in | Wt 209.2 lb

## 2015-05-06 DIAGNOSIS — R9082 White matter disease, unspecified: Secondary | ICD-10-CM | POA: Insufficient documentation

## 2015-05-06 DIAGNOSIS — R930 Abnormal findings on diagnostic imaging of skull and head, not elsewhere classified: Secondary | ICD-10-CM

## 2015-05-06 DIAGNOSIS — R42 Dizziness and giddiness: Secondary | ICD-10-CM

## 2015-05-06 DIAGNOSIS — G43009 Migraine without aura, not intractable, without status migrainosus: Secondary | ICD-10-CM | POA: Diagnosis not present

## 2015-05-06 DIAGNOSIS — I671 Cerebral aneurysm, nonruptured: Secondary | ICD-10-CM | POA: Diagnosis not present

## 2015-05-06 NOTE — Progress Notes (Signed)
[facility]  PATIENT: Ruth [name] DOB: [birth year]  REFERRING DOCTOR OR PCP:  Garnet Koyanagi SOURCE: patient and records and MRI images on PACS  _________________________________   HISTORICAL  CHIEF COMPLAINT:  Chief Complaint  Patient presents with  . Headaches    Dorinne is here to discuss abnormal imaging results.  Sts. she has always had allergy related h/a's, but about a yr. ago. h/a's became more frequent and more severe.  Her pcp referred her to Dr. Tomi Likens at [facility].  MRI brain, MRA head, and CT angiogram were all abnormal, and she sts. he told her she has a small aneurysm, and started her on a baby asa daily, and Zocor.  She is here today for a 2nd opinion./fim  . Dizziness  . Abnormal MRI, MRA, CT    HISTORY OF PRESENT ILLNESS:  I had the pleasure seeing you patient, Ruth [name], at [facility] for neurologic consultation regarding her headaches and abnormal imaging studies.   She has had headaches off and on for several years, sometimes associated with her allergies. Some headaches, however, have a more migrainous characteristic with pounding pain on one side associated with some nausea she generally does not get visual or other aura before the headache. A typical headache will last about half a day and then resolve.  Over the past year she has had 2 episodes of dizziness lasting about 2 hours. During that time she felt that she was off balance. The vertigo was more translational than spinning. There was not lightheadedness. Those episodes she would stumble but did not fall.  She saw Dr. Tomi Likens at [facility]. He ordered an MRI of the brain. It showed dozen small T2/FLAIR hyperintense foci, predominantly in the subcortical white matter. 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 CT angiogram confirmed the aneurysm and also showed that there is asymmetric  flow in the internal carotid arteries.  Of note, the left A1 segment of the ACA feeds bilateral A2 segments.    She was prescribed Topamax. However, since the headaches are not back, and she decided not to take that.  Usually, when she gets a headache, Advil will knock out the pain. Dr. Tomi Likens also prescribed aspirin and Zocor. She is taking those.  We discussed possible risk factors for aneurysms and for white matter foci on the MRI.     She currently does not have diagnosed hypertension but has had mild hypertension in the past. This improved when she lost weight. BP today was actually elevated at 146/96. She does not have diabetes. She does not smoke.   She was never a smoker as she has asthma.   She denies any diagnosis heart problem. She has felt that her heart skips a beat at times.  I showed the MRI images to Ruth [name] including the aneurysm and the white matter changes on the MRI.  She has no family history of aneurysm, stroke, MI, sickle cell.  Mother is adopted.     I personally reviewed the following studies on PACS and concur with the imaging interpretations: 03/11/2015 MRI brain with and without contrast: There are some hyperintense foci, predominantly in the subcortical white matter. MR angiogram 04/04/2015: There is a 1.5 mm aneurysm directed medially arising from the left anterior cerebral artery. The left A1 segment feeds both ACA arteries.    CT angiogram 04/16/2015: Confirms 1.5 mm aneurysm.   No area  of significant focal stenosis.  I also reviewed visit notes from Dr. Tomi Likens who saw her at the [facility].   REVIEW OF SYSTEMS: Constitutional: No fevers, chills, sweats, or change in appetite Eyes: No visual changes, double vision, eye pain Ear, nose and throat: No hearing loss, ear pain, nasal congestion, sore throat Cardiovascular: No chest pain, palpitations Respiratory: No shortness of breath at rest or with exertion.   No wheezes GastrointestinaI: No nausea,  vomiting, diarrhea, abdominal pain, fecal incontinence Genitourinary: No dysuria, urinary retention or frequency.  No nocturia. Musculoskeletal: No neck pain, back pain Integumentary: No rash, pruritus, skin lesions Neurological: as above Psychiatric: No depression at this time.  No anxiety Endocrine: No palpitations, diaphoresis, change in appetite, change in weigh or increased thirst Hematologic/Lymphatic: No anemia, purpura, petechiae. Allergic/Immunologic: No itchy/runny eyes, nasal congestion, recent allergic reactions, rashes  ALLERGIES: Allergies  Allergen Reactions  . Other     Nuts     HOME MEDICATIONS:  Current outpatient prescriptions:  .  albuterol (PROAIR HFA) 108 (90 BASE) MCG/ACT inhaler, Inhale 2 puffs into the lungs every 6 (six) hours as needed.  , Disp: , Rfl:  .  cetirizine (ZYRTEC) 10 MG chewable tablet, Chew 10 mg by mouth daily. As needed., Disp: , Rfl:  .  desonide (DESOWEN) 0.05 % lotion, Apply to affected area  twice daily, Disp: 118 mL, Rfl: 1 .  DULERA 200-5 MCG/ACT AERO, INHALE 2 PUFFS TWO TIMES DAILY THEN RINSE MOUTH AS DIRECTED BY PRESCRIBER., Disp: 13 Inhaler, Rfl: 0 .  EPINEPHrine (EPI-PEN) 0.3 mg/0.3 mL SOAJ injection, Inject into upper thigh for severe allergic reaction, Disp: 1 Device, Rfl: prn .  fluticasone (FLONASE) 50 MCG/ACT nasal spray, Place 2 sprays into both nostrils daily., Disp: 16 g, Rfl: 5 .  meclizine (ANTIVERT) 12.5 MG tablet, Take 1 tablet (12.5 mg total) by mouth 3 (three) times daily as needed for dizziness., Disp: 30 tablet, Rfl: 0 .  omeprazole (PRILOSEC) 40 MG capsule, Take 1 capsule (40 mg total) by mouth daily., Disp: 30 capsule, Rfl: 3 .  simvastatin (ZOCOR) 10 MG tablet, Take 1 tablet (10 mg total) by mouth daily., Disp: 30 tablet, Rfl: 6  PAST MEDICAL HISTORY: Past Medical History  Diagnosis Date  . Asthma   . Eczema   . Allergic rhinitis, cause unspecified   . Headache     PAST SURGICAL HISTORY: Past Surgical  History  Procedure Laterality Date  . Cesarean section    . Cesarean section Bilateral [id number]    FAMILY HISTORY: Family History  Problem Relation Age of Onset  . Asthma Mother   . Allergies Mother   . Asthma Father   . Drug abuse Father   . Allergies Father     SOCIAL HISTORY:  Social History   Social History  . Marital Status: Married    Spouse Name: N/A  . Number of Children: N/A  . Years of Education: N/A   Occupational History  . [employer]     guard    Social History Main Topics  . Smoking status: Never Smoker   . Smokeless tobacco: Never Used  . Alcohol Use: 0.0 oz/week    0 Standard drinks or equivalent per week     Comment: one glass of wine per week/fim  . Drug Use: No  . Sexual Activity:    Partners: Male   Other Topics Concern  . Not on file   Social History Narrative   Exercise-- walk, run 2x a week.  Masters degree. Pt lives alone in a home with stairs.      PHYSICAL EXAM  Filed Vitals:   05/06/15 1000  BP: 146/96  Pulse: 70  Resp: 16  Height: 5\' 5"  (1.651 m)  Weight: 209 lb 3.2 oz (94.892 kg)   BP repeated 1030 am and was 140/88  Body mass index is 34.81 kg/(m^2).   General: The patient is well-developed and well-nourished and in no acute distress  Eyes:  Funduscopic exam shows normal optic discs and retinal vessels.  Neck: The neck is supple, no carotid bruits are noted.  The neck is nontender.  Cardiovascular: The heart has a regular rate and rhythm with a normal S1 and S2. There were no murmurs, gallops or rubs. Lungs are clear to auscultation.  Skin: Extremities are without significant edema.  Musculoskeletal:  Back is nontender  Neurologic Exam  Mental status: The patient is alert and oriented x 3 at the time of the examination. The patient has apparent normal recent and remote memory, with an apparently normal attention span and concentration ability.   Speech is normal.  Cranial nerves: Extraocular movements are  full. Pupils are equal, round, and reactive to light and accomodation.  Visual fields are full.  Facial symmetry is present. There is good facial sensation to soft touch bilaterally.Facial strength is normal.  Trapezius and sternocleidomastoid strength is normal. No dysarthria is noted.  The tongue is midline, and the patient has symmetric elevation of the soft palate. No obvious hearing deficits are noted.  Motor:  Muscle bulk is normal.   Tone is normal. Strength is  5 / 5 in all 4 extremities.   Sensory: Sensory testing is intact to pinprick, soft touch and vibration sensation in all 4 extremities.  Coordination: Cerebellar testing reveals good finger-nose-finger and heel-to-shin bilaterally.  Gait and station: Station is normal.   Gait is normal. Tandem gait is normal. Romberg is negative.   Reflexes: Deep tendon reflexes are symmetric and normal bilaterally.   Plantar responses are flexor.    DIAGNOSTIC DATA (LABS, IMAGING, TESTING) - I reviewed patient records, labs, notes, testing and imaging myself where available.  Lab Results  Component Value Date   WBC 5.6 10/10/2014   HGB 13.8 10/10/2014   HCT 41.7 10/10/2014   MCV 86.0 10/10/2014   PLT 220.0 10/10/2014      Component Value Date/Time   NA 139 10/10/2014 1133   K 3.4* 10/10/2014 1133   CL 104 10/10/2014 1133   CO2 28 10/10/2014 1133   GLUCOSE 83 10/10/2014 1133   BUN 11 10/10/2014 1133   CREATININE 0.82 10/10/2014 1133   CREATININE 0.82 08/24/2013 1633   CALCIUM 9.0 10/10/2014 1133   PROT 7.2 10/10/2014 1133   ALBUMIN 3.9 10/10/2014 1133   AST 18 10/10/2014 1133   ALT 14 10/10/2014 1133   ALKPHOS 88 10/10/2014 1133   BILITOT 0.4 10/10/2014 1133   Lab Results  Component Value Date   CHOL 147 10/10/2014   HDL 64.70 10/10/2014   LDLCALC 70 10/10/2014   TRIG 60.0 10/10/2014   CHOLHDL 2 10/10/2014   No results found for: HGBA1C No results found for: VITAMINB12 Lab Results  Component Value Date   TSH 1.26  10/10/2014       ASSESSMENT AND PLAN  Aneurysm, cerebral, nonruptured - Plan: Homocysteine, Sedimentation rate  White matter abnormality on MRI of brain - Plan: Homocysteine, Sedimentation rate  Migraine without aura and without status migrainosus, not intractable - Plan: Homocysteine, Sedimentation  rate  Vertigo   In summary, Ruth [name] is a 55 year old woman with headaches and a couple episodes of vertigo who was found to have white matter changes and a small unruptured aneurysm on MRI. The aneurysm is only 1.5 mm. This will need to be followed but I would not recommend any procedure at this point. If it increases in size I will refer her to neurosurgery.   She will need to repeat the MRI in about 1 year.    The white matter changes are nonspecific and most likely represent chronic microvascular ischemic change. Common in people with migraines and hypertension. She is advised to continue to take aspirin and Zocor. She is going to try to lose more weight and reduce salt intake further. When she returns in 3 months, if her BP is not better I will place her on an agent.  She is also advised to call us if she has any new or worsening neurologic symptoms.  If she has more episodes of vertigo or other neurologic event, I would also consider a echocardiogram with bubble contrast to make sure that there is not an ASD that could also be contributing to the white matter changes.   Thank you for asking me to see Ruth [name] for a neurologic consultation. Please let me know if I can be of further assistance with her or other patients in the future.  Try to lose 15 pounds and reduce salt intake.   If BP not improved, then add an agent   Taylor Levick A. Felecia Shelling, MD, PhD [id number], [id number] AM Certified in Neurology, Clinical Neurophysiology, Sleep Medicine, Pain Medicine and Neuroimaging  [facility] [address], [place], [place] [address] [phone]

## 2015-05-06 NOTE — Patient Instructions (Signed)
We discussed that weight loss and reduce salt may help her blood pressure improve.    However, if blood pressure is not better when you return in 3 months, I would like to start you on a blood pressure agent.   In the meantime, continue to take the aspirin and Zocor.  Call us if you have any new or worsening symptoms.  If you have a couple more spells of the vertigo, we may also check an echocardiogram a few heart

## 2015-05-07 ENCOUNTER — Telehealth: Payer: Self-pay | Admitting: *Deleted

## 2015-05-07 LAB — HOMOCYSTEINE: Homocysteine: 10.8 umol/L (ref 0.0–15.0)

## 2015-05-07 LAB — SEDIMENTATION RATE: SED RATE: 2 mm/h (ref 0–40)

## 2015-05-07 NOTE — Telephone Encounter (Signed)
-----   Message from Britt Bottom, MD sent at 05/07/2015  2:08 PM EST ----- Please let her know that the blood work was normal

## 2015-05-07 NOTE — Telephone Encounter (Signed)
LMTC./fim 

## 2015-05-08 ENCOUNTER — Other Ambulatory Visit: Payer: Self-pay | Admitting: Internal Medicine

## 2015-05-09 ENCOUNTER — Telehealth: Payer: Self-pay | Admitting: *Deleted

## 2015-05-09 NOTE — Telephone Encounter (Signed)
noted/fim 

## 2015-05-09 NOTE — Telephone Encounter (Signed)
Pt returned call and was notified lab work was normal

## 2015-05-09 NOTE — Telephone Encounter (Signed)
-----   Message from Britt Bottom, MD sent at 05/07/2015  2:08 PM EST ----- Please let her know that the blood work was normal

## 2015-05-09 NOTE — Telephone Encounter (Signed)
Per [facility] in the phone room, pt. returned call, was notified labs were normal/fim

## 2015-06-11 ENCOUNTER — Other Ambulatory Visit: Payer: Self-pay | Admitting: Internal Medicine

## 2015-06-12 ENCOUNTER — Other Ambulatory Visit: Payer: Self-pay | Admitting: Internal Medicine

## 2015-06-12 MED ORDER — MOMETASONE FURO-FORMOTEROL FUM 200-5 MCG/ACT IN AERO: INHALATION_SPRAY | RESPIRATORY_TRACT | Status: DC

## 2015-06-12 NOTE — Telephone Encounter (Signed)
Requests refill of Dulera 200 to [facility] in [place] Pt scheduled with CY 06/30/15 Aware to keep appt for further refills.  Nothing further needed.

## 2015-06-30 ENCOUNTER — Ambulatory Visit (INDEPENDENT_AMBULATORY_CARE_PROVIDER_SITE_OTHER): Payer: 59 | Admitting: Internal Medicine

## 2015-06-30 ENCOUNTER — Encounter: Payer: Self-pay | Admitting: Internal Medicine

## 2015-06-30 VITALS — BP 134/94 | HR 71 | Ht 65.0 in | Wt 203.0 lb

## 2015-06-30 DIAGNOSIS — J301 Allergic rhinitis due to pollen: Secondary | ICD-10-CM

## 2015-06-30 MED ORDER — MOMETASONE FURO-FORMOTEROL FUM 200-5 MCG/ACT IN AERO: INHALATION_SPRAY | RESPIRATORY_TRACT | Status: DC

## 2015-06-30 MED ORDER — AEROCHAMBER MV MISC: Status: DC

## 2015-06-30 MED ORDER — ALBUTEROL SULFATE HFA 108 (90 BASE) MCG/ACT IN AERS: 2.0000 | INHALATION_SPRAY | Freq: Four times a day (QID) | RESPIRATORY_TRACT | Status: DC | PRN

## 2015-06-30 NOTE — Patient Instructions (Signed)
Refill scripts sent for [facility] for aerochamber spacer tube to use with Dulera  Instead of Flonase, try otc nasal spray Nasalcrom/ cromolyn/ cromol

## 2015-06-30 NOTE — Progress Notes (Signed)
Patient ID: Ruth [name], female    DOB: [birth year], 55 y.o.   MRN: [id number]  HPI 4/161/2- 45 yoF last seen here 03/18/08 for allergy/ asthma consultation for Dr Etter Sjogren. Note is reviewed. Allergy vaccine helped 8 years ago in [place]. She has not been in control, with itching eyes, head and chest congested. Perennial symptoms. She flared 2 weeks ago without clear change in exposure. 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 She was given singulair, tessalon, Veramyst, augmentin, no prednisone.  Uses Dulera 100 regularly. Still itch and sneeze, itching eyes. Less cough.  09/11/10-  Allergic conjunctivitis/ rhinitis, Asthma  Eyes always itch, still some hoarse, nasal drainage. Denies chest tightness or wheeze taking daily Dulera. Doing better now that Spring pollens have subsided. Taking Veramyst, no eye drops, taking Singulair- not helpful.  Allergy profile - 07/13/10- IgE 768.9, broadly positive Prednisone   03/12/11-55 year old female never smoker followed for Allergic conjunctivitis/ rhinitis, Asthma  Pataday works well for her allergic conjunctivitis. Zyrtec was not effective enough as an antihistamine. Complains of persistent raspiness in her throat, ears itch a lot and she sneezes. Dulera controls her wheezing but she asks if it is the cause of her hoarseness. Omnaris and Veramyst both caused retro-orbital headache.  12/09/11- 55 year old female never smoker followed for Allergic conjunctivitis/ rhinitis, Asthma  No sleep past 3 nights-PND, cough-non-'productive; raspy voice. Blames seasonal allergy over the past week. Skin itches. Some chest tightness and mild wheeze. No sore throat or fever. Has some frontal headache and ears feel full. Nasonex does not help.  11/22/12- 55 year old female never smoker followed for Allergic conjunctivitis/ rhinitis, Asthma FOLLOWS FOR: flare up at this time; flared up asthma as well recently. Has had to use rescue  inhaler again. Some seasonal increased wheeze. Mild nasal congestion and itching of eyes. Out of Pataday, but using Allegra and Dulera.  05/18/13- 55 year old female never smoker followed for Allergic conjunctivitis/ rhinitis, Asthma FOLLOWS FOR:Pt was just on abx about a week or so ago. Continues to have sinus pressure/infections. Prednisone in December for wheeze. Zpak helped 1 week ago from PCP. R retro-orbital HA, occ ear pains. Frequent nasal congestion. Rarely uses rescue inhaler, continues Dulera. Worried about allergy season- avoid shots.  11/27/2013 Acute OV  Complains of right ear congestion, watery eyes, sneezing, clear nasal drainage, PND w/ hoarseness, some tightness in chest and dyspnea x2-3weeks.   Denies f/c/s, n/v/d, hemoptysis, wheezing.  >allergy tx w/ dymista, zyrtec   03/05/2014 Acute OV  Complains of 3 days of  Nasal congestion w/ clear draingae sinus pressure on right and headache. Ears feel full . Had dizziness on first day this resolved. Has been taking zyrtec and flonase with some relief. No cough or fever. No discolored mucus. She denies any chest pain, orthopnea, PND, leg swelling or visual or speech changes. No extremity weakness.  Leaving for cruise on Friday.   06/30/2015-55 year old female never smoker followed for allergic rhinitis/conjunctivitis, asthma Sinuses were clear on CTa of head 04/16/2015 FOLLOW FOR: pt wants to discuss Dulera side effects, says that she has to clear her throat a lot after taking it. Pt diagnosed with a small aneurysm in the brain, wants to discuss side effects of medications and Aneurysm.         Review of Systems See HPI Constitutional:   No-   weight loss, night sweats, fevers, chills, fatigue, lassitude. HEENT:    Neg  tooth/dental problems, sore throat,       +  sneezing,+ itching,  +ear ache, +nasal congestion, post nasal drip,  CV:  No-   chest pain, orthopnea, PND, swelling in lower extremities, anasarca,  dizziness,  palpitations Resp: No-   shortness of breath with exertion or at rest.              No-   productive cough,  No non-productive cough,  No- coughing up of blood.              No-   change in color of mucus.   Skin: No-   rash or lesions. GI:  No-   heartburn, indigestion, abdominal pain, nausea, vomiting,  GU:. MS:  No-   joint pain or swelling.   Neuro-     nothing unusual Psych:  No- change in mood or affect. No depression or anxiety.  No memory loss.  Objective:   Physical Exam General- Alert, Oriented, Affect-appropriate, Distress- none acute Skin- +eczematoid rash on face and arms Lymphadenopathy- none Head- atraumatic            Eyes- Gross vision intact, PERRLA, conjunctivae clear secretions            Ears- Hearing, canals-normal            Nose- Clear, +Septal dev,No- mucus, polyps, erosion, perforation , pale mucosa , nontender sinus             Throat- Mallampati II , mucosa clear , drainage- none, tonsils- atrophic.  Neck- flexible , trachea midline, no stridor , thyroid nl, carotid no bruit Chest - symmetrical excursion , unlabored           Heart/CV- RRR , no murmur , no gallop  , no rub, nl s1 s2                           - JVD- none , edema- none, stasis changes- none, varices- none           Lung- clear to P&A, wheeze- none, cough- none , dullness-none, rub- none           Chest wall-  Abd-  Br/ Gen/ Rectal- Not done, not indicated Extrem- cyanosis- none, clubbing, none, atrophy- none, strength- nl Neuro- grossly intact to observation Skin: few eczema patches noted along torso/arms

## 2015-08-06 ENCOUNTER — Ambulatory Visit (INDEPENDENT_AMBULATORY_CARE_PROVIDER_SITE_OTHER): Payer: 59 | Admitting: Neurology

## 2015-08-06 ENCOUNTER — Encounter: Payer: Self-pay | Admitting: Neurology

## 2015-08-06 VITALS — BP 150/110 | HR 84 | Ht 65.0 in | Wt 204.0 lb

## 2015-08-06 DIAGNOSIS — G43009 Migraine without aura, not intractable, without status migrainosus: Secondary | ICD-10-CM

## 2015-08-06 DIAGNOSIS — I10 Essential (primary) hypertension: Secondary | ICD-10-CM

## 2015-08-06 DIAGNOSIS — R9082 White matter disease, unspecified: Secondary | ICD-10-CM

## 2015-08-06 DIAGNOSIS — R930 Abnormal findings on diagnostic imaging of skull and head, not elsewhere classified: Secondary | ICD-10-CM

## 2015-08-06 DIAGNOSIS — I671 Cerebral aneurysm, nonruptured: Secondary | ICD-10-CM

## 2015-08-06 MED ORDER — VALSARTAN 80 MG PO TABS: 80.0000 mg | ORAL_TABLET | Freq: Every day | ORAL | Status: DC

## 2015-08-06 NOTE — Progress Notes (Signed)
[facility]  PATIENT: Ruth [name] DOB: 02-Aug-1960  REFERRING DOCTOR OR PCP:  Garnet Koyanagi SOURCE: patient and records and MRI images on PACS  _________________________________   HISTORICAL  CHIEF COMPLAINT:  Chief Complaint  Patient presents with  . Follow-up  . Headache    HISTORY OF PRESENT ILLNESS:  Ruth [name] is a 55 yo woman with headaches and abnormal imaging studies.   HA:   She has had only one moderate headache and a few milder ones since her last visit.     The moderate headache was right sided and throbbing with nausea, photophobia.  She generally does not get visual or other aura before the headache. A typical headache will last about half a day and then resolve.    Advil will usually knock it out.     She feels she does not have enough migraine headaches to justify the Topamax or other prophylactic agent and we'll just continue to use when necessary Advil.  Dizziness:   Over the past year she has had a couple episodes of dizziness lasting about 2 hours. During that time she feels off balanced. The vertigo was more translational than spinning. There was not lightheadedness. Those episodes she would stumble but did not fall.   She has not had any since the past Office visit.  HTN:   She has had mildly elevated blood pressures off and on for the last year. BP was elevated at her last visit with [place]. When she last took her BP at the house it was 130/88.    Since it is a risk factor for the aneurysm and the white matter changes on MRI, has treatment.   Other vascular risks:   She does not have diabetes. She does not smoke.   She was never a smoker as she has asthma.   She denies any diagnosis heart problem. She has felt that her heart skips a beat at times.    REVIEW OF SYSTEMS: Constitutional: No fevers, chills, sweats, or change in appetite Eyes: No visual changes, double vision, eye pain Ear, nose and throat: No hearing loss, ear pain,  nasal congestion, sore throat Cardiovascular: No chest pain, palpitations Respiratory: No shortness of breath at rest or with exertion.   No wheezes GastrointestinaI: No nausea, vomiting, diarrhea, abdominal pain, fecal incontinence Genitourinary: No dysuria, urinary retention or frequency.  No nocturia. Musculoskeletal: No neck pain, back pain Integumentary: No rash, pruritus, skin lesions Neurological: as above Psychiatric: No depression at this time.  No anxiety Endocrine: No palpitations, diaphoresis, change in appetite, change in weigh or increased thirst Hematologic/Lymphatic: No anemia, purpura, petechiae. Allergic/Immunologic: No itchy/runny eyes, nasal congestion, recent allergic reactions, rashes  ALLERGIES: Allergies  Allergen Reactions  . Other     Nuts     HOME MEDICATIONS:  Current outpatient prescriptions:  .  albuterol (PROAIR HFA) 108 (90 Base) MCG/ACT inhaler, Inhale 2 puffs into the lungs every 6 (six) hours as needed., Disp: 18 g, Rfl: 12 .  cetirizine (ZYRTEC) 10 MG chewable tablet, Chew 10 mg by mouth daily. As needed., Disp: , Rfl:  .  desonide (DESOWEN) 0.05 % lotion, Apply to affected area  twice daily, Disp: 118 mL, Rfl: 1 .  EPINEPHrine (EPI-PEN) 0.3 mg/0.3 mL SOAJ injection, Inject into upper thigh for severe allergic reaction, Disp: 1 Device, Rfl: prn .  fluticasone (FLONASE) 50 MCG/ACT nasal spray, Place 2 sprays into both nostrils daily., Disp: 16 g, Rfl: 5 .  meclizine (ANTIVERT)  12.5 MG tablet, Take 1 tablet (12.5 mg total) by mouth 3 (three) times daily as needed for dizziness., Disp: 30 tablet, Rfl: 0 .  mometasone-formoterol (DULERA) 200-5 MCG/ACT AERO, INHALE 2 PUFFS TWO TIMES DAILY THEN RINSE MOUTH., Disp: 1 Inhaler, Rfl: 12 .  omeprazole (PRILOSEC) 40 MG capsule, Take 1 capsule (40 mg total) by mouth daily., Disp: 30 capsule, Rfl: 3 .  Spacer/Aero-Holding Chambers (AEROCHAMBER MV) inhaler, Use as instructed, Disp: 1 each, Rfl: 0 .   simvastatin (ZOCOR) 10 MG tablet, Take 1 tablet (10 mg total) by mouth daily. (Patient not taking: Reported on 08/06/2015), Disp: 30 tablet, Rfl: 6  PAST MEDICAL HISTORY: Past Medical History  Diagnosis Date  . Asthma   . Eczema   . Allergic rhinitis, cause unspecified   . Headache     PAST SURGICAL HISTORY: Past Surgical History  Procedure Laterality Date  . Cesarean section    . Cesarean section Bilateral [id number]    FAMILY HISTORY: Family History  Problem Relation Age of Onset  . Asthma Mother   . Allergies Mother   . Asthma Father   . Drug abuse Father   . Allergies Father     SOCIAL HISTORY:  Social History   Social History  . Marital Status: Married    Spouse Name: N/A  . Number of Children: N/A  . Years of Education: N/A   Occupational History  . [employer]     guard    Social History Main Topics  . Smoking status: Never Smoker   . Smokeless tobacco: Never Used  . Alcohol Use: 0.0 oz/week    0 Standard drinks or equivalent per week     Comment: one glass of wine per week/fim  . Drug Use: No  . Sexual Activity:    Partners: Male   Other Topics Concern  . Not on file   Social History Narrative   Exercise-- walk, run 2x a week. Masters degree. Pt lives alone in a home with stairs.      PHYSICAL EXAM  Filed Vitals:   08/06/15 1408  BP: 150/110  Pulse: 84  Height: 5\' 5"  (1.651 m)  Weight: 204 lb (92.534 kg)   BP repeated 229 pm and was 150/104  Body mass index is 33.95 kg/(m^2).   General: The patient is well-developed and well-nourished and in no acute distress  Neck: The neck is supple, no carotid bruits are noted.  The neck is nontender.  Cardiovascular: The heart has a regular rate and rhythm with a normal S1 and S2. There were no murmurs, gallops or rubs.   Neurologic Exam  Mental status: The patient is alert and oriented x 3 at the time of the examination. The patient has apparent normal recent and remote memory, with an  apparently normal attention span and concentration ability.   Speech is normal.  Cranial nerves: Extraocular movements are full. Pupils are equal, round, and reactive to light and accomodation.   Facial symmetry is present. There is good facial sensation to soft touch bilaterally.Facial strength is normal.  Trapezius and sternocleidomastoid strength is normal. No dysarthria is noted.  The tongue is midline, and the patient has symmetric elevation of the soft palate. No obvious hearing deficits are noted.  Motor:  Muscle bulk is normal.   Tone is normal. Strength is  5 / 5 in all 4 extremities.   Sensory: Sensory testing is intact to pinprick, soft touch and vibration sensation in all 4 extremities.  Coordination: Cerebellar  testing reveals good finger-nose-finger and heel-to-shin bilaterally.  Gait and station: Station is normal.   Gait is normal. Tandem gait is normal. Romberg is negative.   Reflexes: Deep tendon reflexes are symmetric and normal bilaterally.       DIAGNOSTIC DATA (LABS, IMAGING, TESTING) - I reviewed patient records, labs, notes, testing and imaging myself where available.  Lab Results  Component Value Date   WBC 5.6 10/10/2014   HGB 13.8 10/10/2014   HCT 41.7 10/10/2014   MCV 86.0 10/10/2014   PLT 220.0 10/10/2014      Component Value Date/Time   NA 139 10/10/2014 1133   K 3.4* 10/10/2014 1133   CL 104 10/10/2014 1133   CO2 28 10/10/2014 1133   GLUCOSE 83 10/10/2014 1133   BUN 11 10/10/2014 1133   CREATININE 0.82 10/10/2014 1133   CREATININE 0.82 08/24/2013 1633   CALCIUM 9.0 10/10/2014 1133   PROT 7.2 10/10/2014 1133   ALBUMIN 3.9 10/10/2014 1133   AST 18 10/10/2014 1133   ALT 14 10/10/2014 1133   ALKPHOS 88 10/10/2014 1133   BILITOT 0.4 10/10/2014 1133   Lab Results  Component Value Date   CHOL 147 10/10/2014   HDL 64.70 10/10/2014   LDLCALC 70 10/10/2014   TRIG 60.0 10/10/2014   CHOLHDL 2 10/10/2014   No results found for: HGBA1C No results  found for: VITAMINB12 Lab Results  Component Value Date   TSH 1.26 10/10/2014       ASSESSMENT AND PLAN  Migraine without aura and without status migrainosus, not intractable  Aneurysm, cerebral, nonruptured  Essential hypertension  White matter abnormality on MRI of brain     1.   Add Diovan 80 mg and recheck at next visit 2.   Continue to lose weight and reduce salt intake.    3.   Ok to take fish oil 4.   RTC 6 months, set up MRI and MRA again at that time to re-assess and refer to NSU if aneurysm is larger.    Amelia Macken A. Felecia Shelling, MD, PhD [id number], 0000000 PM Certified in Neurology, Clinical Neurophysiology, Sleep Medicine, Pain Medicine and Neuroimaging  [facility] [address], [place], [place] [address] [phone]

## 2015-08-15 ENCOUNTER — Telehealth: Payer: Self-pay | Admitting: Internal Medicine

## 2015-08-15 NOTE — Telephone Encounter (Signed)
Spoke with pt. She is aware of CY's recommendation. Nothing further was needed. 

## 2015-08-15 NOTE — Telephone Encounter (Signed)
Suggest an otc skin allergy ointment like Caladryl for a few dayus

## 2015-08-15 NOTE — Telephone Encounter (Signed)
Spoke with pt and she states that on Wednesday she was bitten in several places on her legs by mosquitos. The areas are swollen and warm to the touch. She states that she has been taking all of her regular allergy medications, but has not taken anything additional such as Benadryl. Pt would like to know what CY recommends and does this mean she is allergic to mosquitos?  CY please advise. Thanks!    Allergies  Allergen Reactions  . Other     Nuts     Current Outpatient Prescriptions on File Prior to Visit  Medication Sig Dispense Refill  . albuterol (PROAIR HFA) 108 (90 Base) MCG/ACT inhaler Inhale 2 puffs into the lungs every 6 (six) hours as needed. 18 g 12  . cetirizine (ZYRTEC) 10 MG chewable tablet Chew 10 mg by mouth daily. As needed.    . desonide (DESOWEN) 0.05 % lotion Apply to affected area  twice daily 118 mL 1  . EPINEPHrine (EPI-PEN) 0.3 mg/0.3 mL SOAJ injection Inject into upper thigh for severe allergic reaction 1 Device prn  . fluticasone (FLONASE) 50 MCG/ACT nasal spray Place 2 sprays into both nostrils daily. 16 g 5  . meclizine (ANTIVERT) 12.5 MG tablet Take 1 tablet (12.5 mg total) by mouth 3 (three) times daily as needed for dizziness. 30 tablet 0  . mometasone-formoterol (DULERA) 200-5 MCG/ACT AERO INHALE 2 PUFFS TWO TIMES DAILY THEN RINSE MOUTH. 1 Inhaler 12  . omeprazole (PRILOSEC) 40 MG capsule Take 1 capsule (40 mg total) by mouth daily. 30 capsule 3  . simvastatin (ZOCOR) 10 MG tablet Take 1 tablet (10 mg total) by mouth daily. (Patient not taking: Reported on 08/06/2015) 30 tablet 6  . Spacer/Aero-Holding Chambers (AEROCHAMBER MV) inhaler Use as instructed 1 each 0  . valsartan (DIOVAN) 80 MG tablet Take 1 tablet (80 mg total) by mouth daily. 30 tablet 5   No current facility-administered medications on file prior to visit.

## 2015-09-02 ENCOUNTER — Other Ambulatory Visit: Payer: Self-pay

## 2015-09-02 MED ORDER — DESONIDE 0.05 % EX LOTN: TOPICAL_LOTION | CUTANEOUS | Status: DC

## 2015-09-03 ENCOUNTER — Telehealth: Payer: Self-pay | Admitting: Neurology

## 2015-09-03 MED ORDER — LISINOPRIL 10 MG PO TABS: 10.0000 mg | ORAL_TABLET | Freq: Every day | ORAL | Status: DC

## 2015-09-03 NOTE — Telephone Encounter (Signed)
I have spoken with Ruth [name] this afternoon.  She started Diovan about a month ago; sts. onset one week ago of intermittent edema in hands, generalized hives, itching.  She will stop the Diovan (has not missed any doses yet) and I will call her back once RAS has reviewed her chart and rec. any changes to her tx. plan.  She verbalized understanding of same/fim

## 2015-09-03 NOTE — Telephone Encounter (Signed)
Patient called regarding valsartan (DIOVAN) 80 MG tablet, thinks she is having a reaction, hand swells and is itching, legs do the same thing, gets hives and is itching as well, states this medication is the only thing new that she is doing, has been taking medication for almost a month.

## 2015-09-03 NOTE — Telephone Encounter (Signed)
I have spoken with Ruth [name].  Per RAS, she can d/c Diovan and start Lisinopril 10mg  po qd.  She is agreeable with this plan.  Rx. escribed to [facility] per her request/fim

## 2015-09-17 NOTE — Telephone Encounter (Signed)
Patient returned your call and requesting a call back.  Thanks!

## 2015-09-17 NOTE — Telephone Encounter (Signed)
LMTC./fim 

## 2015-09-17 NOTE — Telephone Encounter (Signed)
Patient called to advise "she is having same reaction to new medication that she had with previous medication except with new medication, headaches are back. Would prefer that if she is going to have same reaction, she would rather go back to previous medication."

## 2015-09-18 MED ORDER — VALSARTAN 80 MG PO TABS: 80.0000 mg | ORAL_TABLET | Freq: Every day | ORAL | Status: DC

## 2015-09-18 NOTE — Telephone Encounter (Signed)
Patient returned Faith's call. Please call [phone].

## 2015-09-18 NOTE — Addendum Note (Signed)
Addended by: France Ravens I on: 09/18/2015 10:13 AM   Modules accepted: Orders, Medications

## 2015-09-18 NOTE — Telephone Encounter (Signed)
I have spoken with Ruth [name] this morning.  RAS originally rx'd Diovan 80mg  po qd.  H/A's were better on Diovan, but she developed a rash that she thought might be due to Diovan, so he switched her to Lisinopril.  Rash has continued, so she is now uncertain about what is triggering it; thinks it may not have been the Diovan.  Also h/a's are worse.  She sts. she would like to stop Lisinopril and go back to Diovan.  Per RAS, ok to make this change.  Lisinopril d/c from med list and new Diovan rx. escribed to [facility] per pt's request/fim

## 2015-10-13 ENCOUNTER — Encounter: Payer: 59 | Admitting: Family Medicine

## 2015-10-30 ENCOUNTER — Other Ambulatory Visit: Payer: Self-pay

## 2015-10-30 ENCOUNTER — Ambulatory Visit (INDEPENDENT_AMBULATORY_CARE_PROVIDER_SITE_OTHER): Payer: 59 | Admitting: Family Medicine

## 2015-10-30 ENCOUNTER — Encounter: Payer: Self-pay | Admitting: Family Medicine

## 2015-10-30 VITALS — BP 128/84 | HR 77 | Temp 98.3°F | Ht 66.0 in | Wt 211.6 lb

## 2015-10-30 DIAGNOSIS — I10 Essential (primary) hypertension: Secondary | ICD-10-CM | POA: Diagnosis not present

## 2015-10-30 DIAGNOSIS — Z0000 Encounter for general adult medical examination without abnormal findings: Secondary | ICD-10-CM

## 2015-10-30 DIAGNOSIS — Z1159 Encounter for screening for other viral diseases: Secondary | ICD-10-CM | POA: Diagnosis not present

## 2015-10-30 LAB — CBC WITH DIFFERENTIAL/PLATELET
BASOS PCT: 0.7 % (ref 0.0–3.0)
Basophils Absolute: 0 10*3/uL (ref 0.0–0.1)
EOS ABS: 0.1 10*3/uL (ref 0.0–0.7)
Eosinophils Relative: 2.2 % (ref 0.0–5.0)
HCT: 40.5 % (ref 36.0–46.0)
HEMOGLOBIN: 13.5 g/dL (ref 12.0–15.0)
Lymphocytes Relative: 28.7 % (ref 12.0–46.0)
Lymphs Abs: 1.6 10*3/uL (ref 0.7–4.0)
MCHC: 33.4 g/dL (ref 30.0–36.0)
MCV: 85.1 fl (ref 78.0–100.0)
MONO ABS: 0.3 10*3/uL (ref 0.1–1.0)
Monocytes Relative: 5.2 % (ref 3.0–12.0)
Neutro Abs: 3.6 10*3/uL (ref 1.4–7.7)
Neutrophils Relative %: 63.2 % (ref 43.0–77.0)
Platelets: 212 10*3/uL (ref 150.0–400.0)
RBC: 4.76 Mil/uL (ref 3.87–5.11)
RDW: 14.3 % (ref 11.5–15.5)
WBC: 5.7 10*3/uL (ref 4.0–10.5)

## 2015-10-30 LAB — COMPREHENSIVE METABOLIC PANEL
ALBUMIN: 3.9 g/dL (ref 3.5–5.2)
ALK PHOS: 100 U/L (ref 39–117)
ALT: 13 U/L (ref 0–35)
AST: 14 U/L (ref 0–37)
BILIRUBIN TOTAL: 0.5 mg/dL (ref 0.2–1.2)
BUN: 11 mg/dL (ref 6–23)
CALCIUM: 8.9 mg/dL (ref 8.4–10.5)
CHLORIDE: 103 meq/L (ref 96–112)
CO2: 28 mEq/L (ref 19–32)
CREATININE: 0.78 mg/dL (ref 0.40–1.20)
GFR: 99.5 mL/min (ref >60.00)
Glucose, Bld: 83 mg/dL (ref 70–99)
Potassium: 3.4 mEq/L — ABNORMAL LOW (ref 3.5–5.1)
Sodium: 137 mEq/L (ref 135–145)
TOTAL PROTEIN: 7.4 g/dL (ref 6.0–8.3)

## 2015-10-30 LAB — LIPID PANEL
CHOLESTEROL: 143 mg/dL (ref 0–200)
HDL: 73.5 mg/dL (ref >39.00)
LDL CALC: 61 mg/dL (ref 0–99)
NonHDL: 69.85
TRIGLYCERIDES: 45 mg/dL (ref 0.0–149.0)
Total CHOL/HDL Ratio: 2
VLDL: 9 mg/dL (ref 0.0–40.0)

## 2015-10-30 LAB — TSH: TSH: 1.54 u[IU]/mL (ref 0.35–4.50)

## 2015-10-30 LAB — POCT URINALYSIS DIPSTICK
Bilirubin, UA: NEGATIVE
Glucose, UA: NEGATIVE
Ketones, UA: NEGATIVE
Leukocytes, UA: NEGATIVE
Nitrite, UA: NEGATIVE
PH UA: 8
PROTEIN UA: 0.15
RBC UA: NEGATIVE
SPEC GRAV UA: 1.015
UROBILINOGEN UA: 0.2

## 2015-10-30 NOTE — Assessment & Plan Note (Signed)
con't diovan

## 2015-10-30 NOTE — Patient Instructions (Signed)
Preventive Care for Adults, Female A healthy lifestyle and preventive care can promote health and wellness. Preventive health guidelines for women include the following key practices.  A routine yearly physical is a good way to check with your health care provider about your health and preventive screening. It is a chance to share any concerns and updates on your health and to receive a thorough exam.  Visit your dentist for a routine exam and preventive care every 6 months. Brush your teeth twice a day and floss once a day. Good oral hygiene prevents tooth decay and gum disease.  The frequency of eye exams is based on your age, health, family medical history, use of contact lenses, and other factors. Follow your health care provider's recommendations for frequency of eye exams.  Eat a healthy diet. Foods like vegetables, fruits, whole grains, low-fat dairy products, and lean protein foods contain the nutrients you need without too many calories. Decrease your intake of foods high in solid fats, added sugars, and salt. Eat the right amount of calories for you.Get information about a proper diet from your health care provider, if necessary.  Regular physical exercise is one of the most important things you can do for your health. Most adults should get at least 150 minutes of moderate-intensity exercise (any activity that increases your heart rate and causes you to sweat) each week. In addition, most adults need muscle-strengthening exercises on 2 or more days a week.  Maintain a healthy weight. The body mass index (BMI) is a screening tool to identify possible weight problems. It provides an estimate of body fat based on height and weight. Your health care provider can find your BMI and can help you achieve or maintain a healthy weight.For adults 20 years and older:  A BMI below 18.5 is considered underweight.  A BMI of 18.5 to 24.9 is normal.  A BMI of 25 to 29.9 is considered overweight.  A  BMI of 30 and above is considered obese.  Maintain normal blood lipids and cholesterol levels by exercising and minimizing your intake of saturated fat. Eat a balanced diet with plenty of fruit and vegetables. Blood tests for lipids and cholesterol should begin at age 45 and be repeated every 5 years. If your lipid or cholesterol levels are high, you are over 50, or you are at high risk for heart disease, you may need your cholesterol levels checked more frequently.Ongoing high lipid and cholesterol levels should be treated with medicines if diet and exercise are not working.  If you smoke, find out from your health care provider how to quit. If you do not use tobacco, do not start.  Lung cancer screening is recommended for adults aged 45-80 years who are at high risk for developing lung cancer because of a history of smoking. A yearly low-dose CT scan of the lungs is recommended for people who have at least a 30-pack-year history of smoking and are a current smoker or have quit within the past 15 years. A pack year of smoking is smoking an average of 1 pack of cigarettes a day for 1 year (for example: 1 pack a day for 30 years or 2 packs a day for 15 years). Yearly screening should continue until the smoker has stopped smoking for at least 15 years. Yearly screening should be stopped for people who develop a health problem that would prevent them from having lung cancer treatment.  If you are pregnant, do not drink alcohol. If you are  breastfeeding, be very cautious about drinking alcohol. If you are not pregnant and choose to drink alcohol, do not have more than 1 drink per day. One drink is considered to be 12 ounces (355 mL) of beer, 5 ounces (148 mL) of wine, or 1.5 ounces (44 mL) of liquor.  Avoid use of street drugs. Do not share needles with anyone. Ask for help if you need support or instructions about stopping the use of drugs.  High blood pressure causes heart disease and increases the risk  of stroke. Your blood pressure should be checked at least every 1 to 2 years. Ongoing high blood pressure should be treated with medicines if weight loss and exercise do not work.  If you are 55-79 years old, ask your health care provider if you should take aspirin to prevent strokes.  Diabetes screening is done by taking a blood sample to check your blood glucose level after you have not eaten for a certain period of time (fasting). If you are not overweight and you do not have risk factors for diabetes, you should be screened once every 3 years starting at age 45. If you are overweight or obese and you are 40-70 years of age, you should be screened for diabetes every year as part of your cardiovascular risk assessment.  Breast cancer screening is essential preventive care for women. You should practice "breast self-awareness." This means understanding the normal appearance and feel of your breasts and may include breast self-examination. Any changes detected, no matter how small, should be reported to a health care provider. Women in their 20s and 30s should have a clinical breast exam (CBE) by a health care provider as part of a regular health exam every 1 to 3 years. After age 40, women should have a CBE every year. Starting at age 40, women should consider having a mammogram (breast X-ray test) every year. Women who have a family history of breast cancer should talk to their health care provider about genetic screening. Women at a high risk of breast cancer should talk to their health care providers about having an MRI and a mammogram every year.  Breast cancer gene (BRCA)-related cancer risk assessment is recommended for women who have family members with BRCA-related cancers. BRCA-related cancers include breast, ovarian, tubal, and peritoneal cancers. Having family members with these cancers may be associated with an increased risk for harmful changes (mutations) in the breast cancer genes BRCA1 and  BRCA2. Results of the assessment will determine the need for genetic counseling and BRCA1 and BRCA2 testing.  Your health care provider may recommend that you be screened regularly for cancer of the pelvic organs (ovaries, uterus, and vagina). This screening involves a pelvic examination, including checking for microscopic changes to the surface of your cervix (Pap test). You may be encouraged to have this screening done every 3 years, beginning at age 21.  For women ages 30-65, health care providers may recommend pelvic exams and Pap testing every 3 years, or they may recommend the Pap and pelvic exam, combined with testing for human papilloma virus (HPV), every 5 years. Some types of HPV increase your risk of cervical cancer. Testing for HPV may also be done on women of any age with unclear Pap test results.  Other health care providers may not recommend any screening for nonpregnant women who are considered low risk for pelvic cancer and who do not have symptoms. Ask your health care provider if a screening pelvic exam is right for   you.  If you have had past treatment for cervical cancer or a condition that could lead to cancer, you need Pap tests and screening for cancer for at least 20 years after your treatment. If Pap tests have been discontinued, your risk factors (such as having a new sexual partner) need to be reassessed to determine if screening should resume. Some women have medical problems that increase the chance of getting cervical cancer. In these cases, your health care provider may recommend more frequent screening and Pap tests.  Colorectal cancer can be detected and often prevented. Most routine colorectal cancer screening begins at the age of 50 years and continues through age 75 years. However, your health care provider may recommend screening at an earlier age if you have risk factors for colon cancer. On a yearly basis, your health care provider may provide home test kits to check  for hidden blood in the stool. Use of a small camera at the end of a tube, to directly examine the colon (sigmoidoscopy or colonoscopy), can detect the earliest forms of colorectal cancer. Talk to your health care provider about this at age 50, when routine screening begins. Direct exam of the colon should be repeated every 5-10 years through age 75 years, unless early forms of precancerous polyps or small growths are found.  People who are at an increased risk for hepatitis B should be screened for this virus. You are considered at high risk for hepatitis B if:  You were born in a country where hepatitis B occurs often. Talk with your health care provider about which countries are considered high risk.  Your parents were born in a high-risk country and you have not received a shot to protect against hepatitis B (hepatitis B vaccine).  You have HIV or AIDS.  You use needles to inject street drugs.  You live with, or have sex with, someone who has hepatitis B.  You get hemodialysis treatment.  You take certain medicines for conditions like cancer, organ transplantation, and autoimmune conditions.  Hepatitis C blood testing is recommended for all people born from 1945 through 1965 and any individual with known risks for hepatitis C.  Practice safe sex. Use condoms and avoid high-risk sexual practices to reduce the spread of sexually transmitted infections (STIs). STIs include gonorrhea, chlamydia, syphilis, trichomonas, herpes, HPV, and human immunodeficiency virus (HIV). Herpes, HIV, and HPV are viral illnesses that have no cure. They can result in disability, cancer, and death.  You should be screened for sexually transmitted illnesses (STIs) including gonorrhea and chlamydia if:  You are sexually active and are younger than 24 years.  You are older than 24 years and your health care provider tells you that you are at risk for this type of infection.  Your sexual activity has changed  since you were last screened and you are at an increased risk for chlamydia or gonorrhea. Ask your health care provider if you are at risk.  If you are at risk of being infected with HIV, it is recommended that you take a prescription medicine daily to prevent HIV infection. This is called preexposure prophylaxis (PrEP). You are considered at risk if:  You are sexually active and do not regularly use condoms or know the HIV status of your partner(s).  You take drugs by injection.  You are sexually active with a partner who has HIV.  Talk with your health care provider about whether you are at high risk of being infected with HIV. If   you choose to begin PrEP, you should first be tested for HIV. You should then be tested every 3 months for as long as you are taking PrEP.  Osteoporosis is a disease in which the bones lose minerals and strength with aging. This can result in serious bone fractures or breaks. The risk of osteoporosis can be identified using a bone density scan. Women ages 67 years and over and women at risk for fractures or osteoporosis should discuss screening with their health care providers. Ask your health care provider whether you should take a calcium supplement or vitamin D to reduce the rate of osteoporosis.  Menopause can be associated with physical symptoms and risks. Hormone replacement therapy is available to decrease symptoms and risks. You should talk to your health care provider about whether hormone replacement therapy is right for you.  Use sunscreen. Apply sunscreen liberally and repeatedly throughout the day. You should seek shade when your shadow is shorter than you. Protect yourself by wearing long sleeves, pants, a wide-brimmed hat, and sunglasses year round, whenever you are outdoors.  Once a month, do a whole body skin exam, using a mirror to look at the skin on your back. Tell your health care provider of new moles, moles that have irregular borders, moles that  are larger than a pencil eraser, or moles that have changed in shape or color.  Stay current with required vaccines (immunizations).  Influenza vaccine. All adults should be immunized every year.  Tetanus, diphtheria, and acellular pertussis (Td, Tdap) vaccine. Pregnant women should receive 1 dose of Tdap vaccine during each pregnancy. The dose should be obtained regardless of the length of time since the last dose. Immunization is preferred during the 27th-36th week of gestation. An adult who has not previously received Tdap or who does not know her vaccine status should receive 1 dose of Tdap. This initial dose should be followed by tetanus and diphtheria toxoids (Td) booster doses every 10 years. Adults with an unknown or incomplete history of completing a 3-dose immunization series with Td-containing vaccines should begin or complete a primary immunization series including a Tdap dose. Adults should receive a Td booster every 10 years.  Varicella vaccine. An adult without evidence of immunity to varicella should receive 2 doses or a second dose if she has previously received 1 dose. Pregnant females who do not have evidence of immunity should receive the first dose after pregnancy. This first dose should be obtained before leaving the health care facility. The second dose should be obtained 4-8 weeks after the first dose.  Human papillomavirus (HPV) vaccine. Females aged 13-26 years who have not received the vaccine previously should obtain the 3-dose series. The vaccine is not recommended for use in pregnant females. However, pregnancy testing is not needed before receiving a dose. If a female is found to be pregnant after receiving a dose, no treatment is needed. In that case, the remaining doses should be delayed until after the pregnancy. Immunization is recommended for any person with an immunocompromised condition through the age of 61 years if she did not get any or all doses earlier. During the  3-dose series, the second dose should be obtained 4-8 weeks after the first dose. The third dose should be obtained 24 weeks after the first dose and 16 weeks after the second dose.  Zoster vaccine. One dose is recommended for adults aged 30 years or older unless certain conditions are present.  Measles, mumps, and rubella (MMR) vaccine. Adults born  before 1957 generally are considered immune to measles and mumps. Adults born in 1957 or later should have 1 or more doses of MMR vaccine unless there is a contraindication to the vaccine or there is laboratory evidence of immunity to each of the three diseases. A routine second dose of MMR vaccine should be obtained at least 28 days after the first dose for students attending postsecondary schools, health care workers, or international travelers. People who received inactivated measles vaccine or an unknown type of measles vaccine during 1963-1967 should receive 2 doses of MMR vaccine. People who received inactivated mumps vaccine or an unknown type of mumps vaccine before 1979 and are at high risk for mumps infection should consider immunization with 2 doses of MMR vaccine. For females of childbearing age, rubella immunity should be determined. If there is no evidence of immunity, females who are not pregnant should be vaccinated. If there is no evidence of immunity, females who are pregnant should delay immunization until after pregnancy. Unvaccinated health care workers born before 1957 who lack laboratory evidence of measles, mumps, or rubella immunity or laboratory confirmation of disease should consider measles and mumps immunization with 2 doses of MMR vaccine or rubella immunization with 1 dose of MMR vaccine.  Pneumococcal 13-valent conjugate (PCV13) vaccine. When indicated, a person who is uncertain of his immunization history and has no record of immunization should receive the PCV13 vaccine. All adults 65 years of age and older should receive this  vaccine. An adult aged 19 years or older who has certain medical conditions and has not been previously immunized should receive 1 dose of PCV13 vaccine. This PCV13 should be followed with a dose of pneumococcal polysaccharide (PPSV23) vaccine. Adults who are at high risk for pneumococcal disease should obtain the PPSV23 vaccine at least 8 weeks after the dose of PCV13 vaccine. Adults older than 53 years of age who have normal immune system function should obtain the PPSV23 vaccine dose at least 1 year after the dose of PCV13 vaccine.  Pneumococcal polysaccharide (PPSV23) vaccine. When PCV13 is also indicated, PCV13 should be obtained first. All adults aged 65 years and older should be immunized. An adult younger than age 65 years who has certain medical conditions should be immunized. Any person who resides in a nursing home or long-term care facility should be immunized. An adult smoker should be immunized. People with an immunocompromised condition and certain other conditions should receive both PCV13 and PPSV23 vaccines. People with human immunodeficiency virus (HIV) infection should be immunized as soon as possible after diagnosis. Immunization during chemotherapy or radiation therapy should be avoided. Routine use of PPSV23 vaccine is not recommended for American Indians, [place] Natives, or people younger than 65 years unless there are medical conditions that require PPSV23 vaccine. When indicated, people who have unknown immunization and have no record of immunization should receive PPSV23 vaccine. One-time revaccination 5 years after the first dose of PPSV23 is recommended for people aged 19-64 years who have chronic kidney failure, nephrotic syndrome, asplenia, or immunocompromised conditions. People who received 1-2 doses of PPSV23 before age 65 years should receive another dose of PPSV23 vaccine at age 65 years or later if at least 5 years have passed since the previous dose. Doses of PPSV23 are not  needed for people immunized with PPSV23 at or after age 65 years.  Meningococcal vaccine. Adults with asplenia or persistent complement component deficiencies should receive 2 doses of quadrivalent meningococcal conjugate (MenACWY-D) vaccine. The doses should be obtained   at least 2 months apart. Microbiologists working with certain meningococcal bacteria, [place] recruits, people at risk during an outbreak, and people who travel to or live in countries with a high rate of meningitis should be immunized. A first-year college student up through age 34 years who is living in a residence hall should receive a dose if she did not receive a dose on or after her 16th birthday. Adults who have certain high-risk conditions should receive one or more doses of vaccine.  Hepatitis A vaccine. Adults who wish to be protected from this disease, have certain high-risk conditions, work with hepatitis A-infected animals, work in hepatitis A research labs, or travel to or work in countries with a high rate of hepatitis A should be immunized. Adults who were previously unvaccinated and who anticipate close contact with an international adoptee during the first 60 days after arrival in the [place] from a country with a high rate of hepatitis A should be immunized.  Hepatitis B vaccine. Adults who wish to be protected from this disease, have certain high-risk conditions, may be exposed to blood or other infectious body fluids, are household contacts or sex partners of hepatitis B positive people, are clients or workers in certain care facilities, or travel to or work in countries with a high rate of hepatitis B should be immunized.  Haemophilus influenzae type b (Hib) vaccine. A previously unvaccinated person with asplenia or sickle cell disease or having a scheduled splenectomy should receive 1 dose of Hib vaccine. Regardless of previous immunization, a recipient of a hematopoietic stem cell transplant should receive a  3-dose series 6-12 months after her successful transplant. Hib vaccine is not recommended for adults with HIV infection. Preventive Services / Frequency Ages 35 to 4 years  Blood pressure check.** / Every 3-5 years.  Lipid and cholesterol check.** / Every 5 years beginning at age 60.  Clinical breast exam.** / Every 3 years for women in their 71s and 10s.  BRCA-related cancer risk assessment.** / For women who have family members with a BRCA-related cancer (breast, ovarian, tubal, or peritoneal cancers).  Pap test.** / Every 2 years from ages 76 through 26. Every 3 years starting at age 61 through age 76 or 93 with a history of 3 consecutive normal Pap tests.  HPV screening.** / Every 3 years from ages 37 through ages 60 to 51 with a history of 3 consecutive normal Pap tests.  Hepatitis C blood test.** / For any individual with known risks for hepatitis C.  Skin self-exam. / Monthly.  Influenza vaccine. / Every year.  Tetanus, diphtheria, and acellular pertussis (Tdap, Td) vaccine.** / Consult your health care provider. Pregnant women should receive 1 dose of Tdap vaccine during each pregnancy. 1 dose of Td every 10 years.  Varicella vaccine.** / Consult your health care provider. Pregnant females who do not have evidence of immunity should receive the first dose after pregnancy.  HPV vaccine. / 3 doses over 6 months, if 93 and younger. The vaccine is not recommended for use in pregnant females. However, pregnancy testing is not needed before receiving a dose.  Measles, mumps, rubella (MMR) vaccine.** / You need at least 1 dose of MMR if you were born in 1957 or later. You may also need a 2nd dose. For females of childbearing age, rubella immunity should be determined. If there is no evidence of immunity, females who are not pregnant should be vaccinated. If there is no evidence of immunity, females who are  pregnant should delay immunization until after pregnancy.  Pneumococcal  13-valent conjugate (PCV13) vaccine.** / Consult your health care provider.  Pneumococcal polysaccharide (PPSV23) vaccine.** / 1 to 2 doses if you smoke cigarettes or if you have certain conditions.  Meningococcal vaccine.** / 1 dose if you are age 68 to 8 years and a Market researcher living in a residence hall, or have one of several medical conditions, you need to get vaccinated against meningococcal disease. You may also need additional booster doses.  Hepatitis A vaccine.** / Consult your health care provider.  Hepatitis B vaccine.** / Consult your health care provider.  Haemophilus influenzae type b (Hib) vaccine.** / Consult your health care provider. Ages 7 to 53 years  Blood pressure check.** / Every year.  Lipid and cholesterol check.** / Every 5 years beginning at age 25 years.  Lung cancer screening. / Every year if you are aged 11-80 years and have a 30-pack-year history of smoking and currently smoke or have quit within the past 15 years. Yearly screening is stopped once you have quit smoking for at least 15 years or develop a health problem that would prevent you from having lung cancer treatment.  Clinical breast exam.** / Every year after age 48 years.  BRCA-related cancer risk assessment.** / For women who have family members with a BRCA-related cancer (breast, ovarian, tubal, or peritoneal cancers).  Mammogram.** / Every year beginning at age 41 years and continuing for as long as you are in good health. Consult with your health care provider.  Pap test.** / Every 3 years starting at age 65 years through age 37 or 70 years with a history of 3 consecutive normal Pap tests.  HPV screening.** / Every 3 years from ages 72 years through ages 60 to 40 years with a history of 3 consecutive normal Pap tests.  Fecal occult blood test (FOBT) of stool. / Every year beginning at age 21 years and continuing until age 5 years. You may not need to do this test if you get  a colonoscopy every 10 years.  Flexible sigmoidoscopy or colonoscopy.** / Every 5 years for a flexible sigmoidoscopy or every 10 years for a colonoscopy beginning at age 35 years and continuing until age 48 years.  Hepatitis C blood test.** / For all people born from [birth year] through 1965 and any individual with known risks for hepatitis C.  Skin self-exam. / Monthly.  Influenza vaccine. / Every year.  Tetanus, diphtheria, and acellular pertussis (Tdap/Td) vaccine.** / Consult your health care provider. Pregnant women should receive 1 dose of Tdap vaccine during each pregnancy. 1 dose of Td every 10 years.  Varicella vaccine.** / Consult your health care provider. Pregnant females who do not have evidence of immunity should receive the first dose after pregnancy.  Zoster vaccine.** / 1 dose for adults aged 30 years or older.  Measles, mumps, rubella (MMR) vaccine.** / You need at least 1 dose of MMR if you were born in 1957 or later. You may also need a second dose. For females of childbearing age, rubella immunity should be determined. If there is no evidence of immunity, females who are not pregnant should be vaccinated. If there is no evidence of immunity, females who are pregnant should delay immunization until after pregnancy.  Pneumococcal 13-valent conjugate (PCV13) vaccine.** / Consult your health care provider.  Pneumococcal polysaccharide (PPSV23) vaccine.** / 1 to 2 doses if you smoke cigarettes or if you have certain conditions.  Meningococcal vaccine.** /  Consult your health care provider.  Hepatitis A vaccine.** / Consult your health care provider.  Hepatitis B vaccine.** / Consult your health care provider.  Haemophilus influenzae type b (Hib) vaccine.** / Consult your health care provider. Ages 64 years and over  Blood pressure check.** / Every year.  Lipid and cholesterol check.** / Every 5 years beginning at age 23 years.  Lung cancer screening. / Every year if you  are aged 16-80 years and have a 30-pack-year history of smoking and currently smoke or have quit within the past 15 years. Yearly screening is stopped once you have quit smoking for at least 15 years or develop a health problem that would prevent you from having lung cancer treatment.  Clinical breast exam.** / Every year after age 74 years.  BRCA-related cancer risk assessment.** / For women who have family members with a BRCA-related cancer (breast, ovarian, tubal, or peritoneal cancers).  Mammogram.** / Every year beginning at age 44 years and continuing for as long as you are in good health. Consult with your health care provider.  Pap test.** / Every 3 years starting at age 58 years through age 22 or 39 years with 3 consecutive normal Pap tests. Testing can be stopped between 65 and 70 years with 3 consecutive normal Pap tests and no abnormal Pap or HPV tests in the past 10 years.  HPV screening.** / Every 3 years from ages 64 years through ages 70 or 61 years with a history of 3 consecutive normal Pap tests. Testing can be stopped between 65 and 70 years with 3 consecutive normal Pap tests and no abnormal Pap or HPV tests in the past 10 years.  Fecal occult blood test (FOBT) of stool. / Every year beginning at age 40 years and continuing until age 27 years. You may not need to do this test if you get a colonoscopy every 10 years.  Flexible sigmoidoscopy or colonoscopy.** / Every 5 years for a flexible sigmoidoscopy or every 10 years for a colonoscopy beginning at age 7 years and continuing until age 32 years.  Hepatitis C blood test.** / For all people born from [birth year] through 1965 and any individual with known risks for hepatitis C.  Osteoporosis screening.** / A one-time screening for women ages 30 years and over and women at risk for fractures or osteoporosis.  Skin self-exam. / Monthly.  Influenza vaccine. / Every year.  Tetanus, diphtheria, and acellular pertussis (Tdap/Td)  vaccine.** / 1 dose of Td every 10 years.  Varicella vaccine.** / Consult your health care provider.  Zoster vaccine.** / 1 dose for adults aged 35 years or older.  Pneumococcal 13-valent conjugate (PCV13) vaccine.** / Consult your health care provider.  Pneumococcal polysaccharide (PPSV23) vaccine.** / 1 dose for all adults aged 46 years and older.  Meningococcal vaccine.** / Consult your health care provider.  Hepatitis A vaccine.** / Consult your health care provider.  Hepatitis B vaccine.** / Consult your health care provider.  Haemophilus influenzae type b (Hib) vaccine.** / Consult your health care provider. ** Family history and personal history of risk and conditions may change your health care provider's recommendations.   This information is not intended to replace advice given to you by your health care provider. Make sure you discuss any questions you have with your health care provider.   Document Released: 05/11/2001 Document Revised: 04/05/2014 Document Reviewed: 08/10/2010 Elsevier Interactive Patient Education [employer].

## 2015-10-30 NOTE — Progress Notes (Signed)
Subjective:     Ruth [name] is a 55 y.o. female and is here for a comprehensive physical exam. The patient reports no problems-- seeing neuro for vertigo and aneurysm..  Social History   Social History  . Marital status: Married    Spouse name: N/A  . Number of children: N/A  . Years of education: N/A   Occupational History  . [employer]     guard    Social History Main Topics  . Smoking status: Never Smoker  . Smokeless tobacco: Never Used  . Alcohol use 0.0 oz/week     Comment: one glass of wine per week/fim  . Drug use: No  . Sexual activity: Yes    Partners: Male   Other Topics Concern  . Not on file   Social History Narrative   Exercise-- walk, run 2x a week. Masters degree. Pt lives alone in a home with stairs.    Health Maintenance  Topic Date Due  . Hepatitis C Screening  17-Jan-1961  . COLONOSCOPY  03/11/2013  . INFLUENZA VACCINE  12/30/2015 (Originally 10/28/2015)  . MAMMOGRAM  05/21/2016  . PAP SMEAR  04/29/2017  . TETANUS/TDAP  06/08/2022  . HIV Screening  Completed    The following portions of the patient's history were reviewed and updated as appropriate: She  has a past medical history of Allergic rhinitis, cause unspecified; Asthma; Eczema; and Headache. She  does not have any pertinent problems on file. She  has a past surgical history that includes Cesarean section and Cesarean section (Bilateral, [id number]). Her family history includes Allergies in her father and mother; Asthma in her father and mother; Drug abuse in her father. She  reports that she has never smoked. She has never used smokeless tobacco. She reports that she drinks alcohol. She reports that she does not use drugs. She has a current medication list which includes the following prescription(s): albuterol, cetirizine, desonide, epinephrine, fluticasone, meclizine, mometasone-formoterol, omeprazole, aerochamber mv, and valsartan. Current Outpatient Prescriptions on File Prior to  Visit  Medication Sig Dispense Refill  . albuterol (PROAIR HFA) 108 (90 Base) MCG/ACT inhaler Inhale 2 puffs into the lungs every 6 (six) hours as needed. 18 g 12  . cetirizine (ZYRTEC) 10 MG chewable tablet Chew 10 mg by mouth daily. As needed.    . desonide (DESOWEN) 0.05 % lotion Apply to affected area  twice daily 118 mL 1  . EPINEPHrine (EPI-PEN) 0.3 mg/0.3 mL SOAJ injection Inject into upper thigh for severe allergic reaction 1 Device prn  . fluticasone (FLONASE) 50 MCG/ACT nasal spray Place 2 sprays into both nostrils daily. 16 g 5  . meclizine (ANTIVERT) 12.5 MG tablet Take 1 tablet (12.5 mg total) by mouth 3 (three) times daily as needed for dizziness. 30 tablet 0  . mometasone-formoterol (DULERA) 200-5 MCG/ACT AERO INHALE 2 PUFFS TWO TIMES DAILY THEN RINSE MOUTH. 1 Inhaler 12  . omeprazole (PRILOSEC) 40 MG capsule Take 1 capsule (40 mg total) by mouth daily. 30 capsule 3  . Spacer/Aero-Holding Chambers (AEROCHAMBER MV) inhaler Use as instructed 1 each 0  . valsartan (DIOVAN) 80 MG tablet Take 1 tablet (80 mg total) by mouth daily. 30 tablet 11   No current facility-administered medications on file prior to visit.    She is allergic to other..  Review of Systems Review of Systems  Constitutional: Negative for activity change, appetite change and fatigue.  HENT: Negative for hearing loss, congestion, tinnitus and ear discharge.  due Eyes: Negative for  visual disturbance (see optho q2y -- vision corrected to 20/20 with glasses).  Respiratory: Negative for cough, chest tightness and shortness of breath.   Cardiovascular: Negative for chest pain, palpitations and leg swelling.  Gastrointestinal: Negative for abdominal pain, diarrhea, constipation and abdominal distention.  Genitourinary: Negative for urgency, frequency, decreased urine volume and difficulty urinating.  Musculoskeletal: Negative for back pain, arthralgias and gait problem.  Skin: Negative for color change, pallor and  rash.  Neurological: Negative for dizziness, light-headedness, numbness and headaches.  Hematological: Negative for adenopathy. Does not bruise/bleed easily.  Psychiatric/Behavioral: Negative for suicidal ideas, confusion, sleep disturbance, self-injury, dysphoric mood, decreased concentration and agitation.       Objective:    BP 128/84 (BP Location: Left Arm, Patient Position: Sitting, Cuff Size: Large)   Pulse 77   Temp 98.3 F (36.8 C) (Oral)   Ht 5\' 6"  (1.676 m)   Wt 211 lb 9.6 oz (96 kg)   LMP 09/19/2015   SpO2 98%   BMI 34.15 kg/m  General appearance: alert, cooperative, appears stated age and no distress Head: Normocephalic, without obvious abnormality, atraumatic Eyes: conjunctivae/corneas clear. PERRL, EOM's intact. Fundi benign. Ears: normal TM's and external ear canals both ears Nose: Nares normal. Septum midline. Mucosa normal. No drainage or sinus tenderness. Throat: lips, mucosa, and tongue normal; teeth and gums normal Neck: no adenopathy, no carotid bruit, no JVD, supple, symmetrical, trachea midline and thyroid not enlarged, symmetric, no tenderness/mass/nodules Back: symmetric, no curvature. ROM normal. No CVA tenderness. Lungs: clear to auscultation bilaterally Breasts: gyn Heart: regular rate and rhythm, S1, S2 normal, no murmur, click, rub or gallop Abdomen: soft, non-tender; bowel sounds normal; no masses,  no organomegaly Pelvic: deferred Extremities: extremities normal, atraumatic, no cyanosis or edema Pulses: 2+ and symmetric Skin: Skin color, texture, turgor normal. No rashes or lesions Lymph nodes: Cervical, supraclavicular, and axillary nodes normal. Neurologic: Alert and oriented X 3, normal strength and tone. Normal symmetric reflexes. Normal coordination and gait    Assessment:    Healthy female exam.      Plan:    ghm utd Check labs See After Visit Summary for Counseling Recommendations    1. Preventative health care See above - CBC  with Differential/Platelet - Lipid panel - Comprehensive metabolic panel - POCT urinalysis dipstick - TSH - Ambulatory referral to Gastroenterology  2. Essential hypertension Stable con't diovan    3. Need for hepatitis C screening test   - Hepatitis C antibody

## 2015-10-30 NOTE — Progress Notes (Signed)
Pre visit review using our clinic review tool, if applicable. No additional management support is needed unless otherwise documented below in the visit note. 

## 2015-10-31 LAB — HEPATITIS C ANTIBODY: HCV AB: NEGATIVE

## 2015-12-02 ENCOUNTER — Encounter: Payer: Self-pay | Admitting: Family Medicine

## 2016-01-17 ENCOUNTER — Other Ambulatory Visit: Payer: Self-pay | Admitting: Family Medicine

## 2016-01-17 DIAGNOSIS — J302 Other seasonal allergic rhinitis: Secondary | ICD-10-CM

## 2016-02-11 ENCOUNTER — Ambulatory Visit: Payer: 59 | Admitting: Neurology

## 2016-02-16 ENCOUNTER — Ambulatory Visit: Payer: 59 | Admitting: Neurology

## 2016-02-18 ENCOUNTER — Encounter: Payer: Self-pay | Admitting: Neurology

## 2016-02-18 ENCOUNTER — Ambulatory Visit (INDEPENDENT_AMBULATORY_CARE_PROVIDER_SITE_OTHER): Payer: 59 | Admitting: Neurology

## 2016-02-18 VITALS — BP 126/78 | HR 70 | Resp 18 | Ht 68.0 in | Wt 209.5 lb

## 2016-02-18 DIAGNOSIS — I671 Cerebral aneurysm, nonruptured: Secondary | ICD-10-CM

## 2016-02-18 DIAGNOSIS — R42 Dizziness and giddiness: Secondary | ICD-10-CM | POA: Diagnosis not present

## 2016-02-18 DIAGNOSIS — I10 Essential (primary) hypertension: Secondary | ICD-10-CM

## 2016-02-18 DIAGNOSIS — G43009 Migraine without aura, not intractable, without status migrainosus: Secondary | ICD-10-CM | POA: Diagnosis not present

## 2016-02-18 DIAGNOSIS — R9082 White matter disease, unspecified: Secondary | ICD-10-CM

## 2016-02-18 DIAGNOSIS — R930 Abnormal findings on diagnostic imaging of skull and head, not elsewhere classified: Secondary | ICD-10-CM

## 2016-02-18 NOTE — Progress Notes (Signed)
[facility]  PATIENT: Ruth [name] DOB: Apr 26, 1960  REFERRING DOCTOR OR PCP:  Garnet Koyanagi SOURCE: patient and records and MRI images on PACS  _________________________________   HISTORICAL  CHIEF COMPLAINT:  Chief Complaint  Patient presents with  . Migraines    Sts. h/a's have been less frequent, less severe since starting Diovan.  Given that she does have an aneurysm that needs to be monitored, she would like to discuss how she could tell the difference between a normal h/a and something more serious. She also would like to discuss new sx. of cramping pian in hands and feet, onset about 2 mos. ago./fim    HISTORY OF PRESENT ILLNESS:  Ruth [name] is a 55 yo woman with headaches, AComm aneurysm and white matter changes on MRI.  Abnl MRI and aneurysm: MRI 03/11/2015 showed mild chronic microvascular ischemic changes, mostly subcortical.  MR angiogram 04/04/2015 showed a 2 mm outpouching anterior communicating artery region that could be an aneurysm or an infundibulum.   CT angiogram was then performed showing a 1.5 mm ACA aneurysm with a narrow neck.  HA:   She is only getting rare headaches now.    She has had only one bad headache since starting Diovan.  She took a Goody's and it improved.   She often gets a duller pressure like headache that she feels are due to her sinuses.    The moderate headache was right sided and throbbing with nausea, photophobia.  She generally does not get visual or other aura before the headache. A typical headache will last about half a day and then resolve.    Advil or goodys will usually knock it out.      HTN:  She is doing much better on Diovan and last BP was 126/78.    Dizziness:   She had one episode of dizziness last month while on a stool.  This one lasted a few minutes only.   Over the past year she has had a couple episodes of dizziness lasting about 2 hours. During that time she feels off balanced. The vertigo was  more translational than spinning. There was not lightheadedness.    Vascular risks:   She has HTN, now better controlled.   She does not have diabetes. She does not smoke.   She was never a smoker as she has asthma.   She denies any diagnosis heart problem. She has felt that her heart skips a beat at times.    REVIEW OF SYSTEMS: Constitutional: No fevers, chills, sweats, or change in appetite Eyes: No visual changes, double vision, eye pain Ear, nose and throat: No hearing loss, ear pain, nasal congestion, sore throat Cardiovascular: No chest pain, palpitations Respiratory: No shortness of breath at rest or with exertion.   No wheezes GastrointestinaI: No nausea, vomiting, diarrhea, abdominal pain, fecal incontinence Genitourinary: No dysuria, urinary retention or frequency.  No nocturia. Musculoskeletal: No neck pain, back pain Integumentary: No rash, pruritus, skin lesions Neurological: as above Psychiatric: No depression at this time.  No anxiety Endocrine: No palpitations, diaphoresis, change in appetite, change in weigh or increased thirst Hematologic/Lymphatic: No anemia, purpura, petechiae. Allergic/Immunologic: No itchy/runny eyes, nasal congestion, recent allergic reactions, rashes  ALLERGIES: Allergies  Allergen Reactions  . Other     Nuts     HOME MEDICATIONS:  Current Outpatient Prescriptions:  .  albuterol (PROAIR HFA) 108 (90 Base) MCG/ACT inhaler, Inhale 2 puffs into the lungs every 6 (six) hours as needed.,  Disp: 18 g, Rfl: 12 .  cetirizine (ZYRTEC) 10 MG chewable tablet, Chew 10 mg by mouth daily. As needed., Disp: , Rfl:  .  desonide (DESOWEN) 0.05 % lotion, Apply to affected area  twice daily, Disp: 118 mL, Rfl: 1 .  EPINEPHrine (EPI-PEN) 0.3 mg/0.3 mL SOAJ injection, Inject into upper thigh for severe allergic reaction, Disp: 1 Device, Rfl: prn .  fluticasone (FLONASE) 50 MCG/ACT nasal spray, PLACE 2 SPRAYS INTO BOTH NOSTRILS DAILY., Disp: 16 g, Rfl: 5 .   meclizine (ANTIVERT) 12.5 MG tablet, Take 1 tablet (12.5 mg total) by mouth 3 (three) times daily as needed for dizziness., Disp: 30 tablet, Rfl: 0 .  mometasone-formoterol (DULERA) 200-5 MCG/ACT AERO, INHALE 2 PUFFS TWO TIMES DAILY THEN RINSE MOUTH., Disp: 1 Inhaler, Rfl: 12 .  omeprazole (PRILOSEC) 40 MG capsule, Take 1 capsule (40 mg total) by mouth daily., Disp: 30 capsule, Rfl: 3 .  Spacer/Aero-Holding Chambers (AEROCHAMBER MV) inhaler, Use as instructed, Disp: 1 each, Rfl: 0 .  valsartan (DIOVAN) 80 MG tablet, Take 1 tablet (80 mg total) by mouth daily., Disp: 30 tablet, Rfl: 11  PAST MEDICAL HISTORY: Past Medical History:  Diagnosis Date  . Allergic rhinitis, cause unspecified   . Asthma   . Eczema   . Headache     PAST SURGICAL HISTORY: Past Surgical History:  Procedure Laterality Date  . CESAREAN SECTION    . CESAREAN SECTION Bilateral [id number]    FAMILY HISTORY: Family History  Problem Relation Age of Onset  . Asthma Mother   . Allergies Mother   . Asthma Father   . Drug abuse Father   . Allergies Father     SOCIAL HISTORY:  Social History   Social History  . Marital status: Married    Spouse name: N/A  . Number of children: N/A  . Years of education: N/A   Occupational History  . [employer]     guard    Social History Main Topics  . Smoking status: Never Smoker  . Smokeless tobacco: Never Used  . Alcohol use 0.0 oz/week     Comment: one glass of wine per week/fim  . Drug use: No  . Sexual activity: Yes    Partners: Male   Other Topics Concern  . Not on file   Social History Narrative   Exercise-- walk, run 2x a week. Masters degree. Pt lives alone in a home with stairs.      PHYSICAL EXAM  Vitals:   02/18/16 0824  BP: 126/78  Pulse: 70  Resp: 18  Weight: 209 lb 8 oz (95 kg)  Height: 5\' 8"  (1.727 m)    Body mass index is 31.85 kg/m.   General: The patient is well-developed and well-nourished and in no acute  distress   Neurologic Exam  Mental status: The patient is alert and oriented x 3 at the time of the examination. The patient has apparent normal recent and remote memory, with an apparently normal attention span and concentration ability.   Speech is normal.  Cranial nerves: Extraocular movements are full.  Facial strength is normal.  Trapezius and sternocleidomastoid strength is normal. No dysarthria is noted.  The tongue is midline, and the patient has symmetric elevation of the soft palate. No obvious hearing deficits are noted.  Motor:  Muscle bulk is normal.   Tone is normal. Strength is  5 / 5 in all 4 extremities.   Sensory: Sensory testing is intact to touch sensation in all  4 extremities.  Coordination: Cerebellar testing reveals good finger-nose-finger and heel-to-shin bilaterally.  Gait and station: Station is normal.   Gait is normal. Tandem gait is normal. Romberg is negative.   Reflexes: Deep tendon reflexes are symmetric and normal bilaterally.       DIAGNOSTIC DATA (LABS, IMAGING, TESTING) - I reviewed patient records, labs, notes, testing and imaging myself where available.  Lab Results  Component Value Date   WBC 5.7 10/30/2015   HGB 13.5 10/30/2015   HCT 40.5 10/30/2015   MCV 85.1 10/30/2015   PLT 212.0 10/30/2015      Component Value Date/Time   NA 137 10/30/2015 1144   K 3.4 (L) 10/30/2015 1144   CL 103 10/30/2015 1144   CO2 28 10/30/2015 1144   GLUCOSE 83 10/30/2015 1144   BUN 11 10/30/2015 1144   CREATININE 0.78 10/30/2015 1144   CREATININE 0.82 08/24/2013 1633   CALCIUM 8.9 10/30/2015 1144   PROT 7.4 10/30/2015 1144   ALBUMIN 3.9 10/30/2015 1144   AST 14 10/30/2015 1144   ALT 13 10/30/2015 1144   ALKPHOS 100 10/30/2015 1144   BILITOT 0.5 10/30/2015 1144   Lab Results  Component Value Date   CHOL 143 10/30/2015   HDL 73.50 10/30/2015   LDLCALC 61 10/30/2015   TRIG 45.0 10/30/2015   CHOLHDL 2 10/30/2015   No results found for: HGBA1C No  results found for: VITAMINB12 Lab Results  Component Value Date   TSH 1.54 10/30/2015       ASSESSMENT AND PLAN  Aneurysm, cerebral, nonruptured - Plan: MR MRA HEAD WO CONTRAST  Migraine without aura and without status migrainosus, not intractable  Vertigo  White matter abnormality on MRI of brain  Essential hypertension      1.   Continue Diovan 80 mg.   Try to lose weight and reduce salt intake.  2.   MR angiogram to follow up the anterior communicating artery. If aneurysm is significantly bigger we will refer to neurosurgery or interventional radiology. 3.    RTC 12 months if stable or sooner based on results or symptoms.     Nickole Adamek A. Felecia Shelling, MD, PhD [id number], [id number] AM Certified in Neurology, Clinical Neurophysiology, Sleep Medicine, Pain Medicine and Neuroimaging  [facility] [address] [place], [place] [address] [phone]

## 2016-05-25 ENCOUNTER — Telehealth: Payer: Self-pay | Admitting: Internal Medicine

## 2016-05-25 NOTE — Telephone Encounter (Signed)
Pharmacy called stating that Ruthe Mannan is not covered  Her ins prefers that she try asmanex or qvar  Please advise, thanks

## 2016-05-26 MED ORDER — BECLOMETHASONE DIPROP HFA 80 MCG/ACT IN AERB: 2.0000 | INHALATION_SPRAY | Freq: Every day | RESPIRATORY_TRACT | 5 refills | Status: DC

## 2016-05-26 NOTE — Telephone Encounter (Signed)
Spoke with pt. She is aware of medication change. Rx has been sent in. Nothing further was needed. 

## 2016-05-26 NOTE — Telephone Encounter (Signed)
Qvar 80 Respimat     Inhale 2 puffs then rinse mouth, once daily

## 2016-05-28 ENCOUNTER — Telehealth: Payer: Self-pay | Admitting: Internal Medicine

## 2016-05-28 MED ORDER — MOMETASONE FUROATE 110 MCG/INH IN AEPB: 2.0000 | INHALATION_SPRAY | Freq: Two times a day (BID) | RESPIRATORY_TRACT | 12 refills | Status: DC

## 2016-05-28 NOTE — Telephone Encounter (Signed)
Per CY-change to Asmanex 110 inhale 2 puffs then rinse mouth twice daily with 12 refills. Pharmacy is aware of change in Rx.

## 2016-08-24 DIAGNOSIS — Z01419 Encounter for gynecological examination (general) (routine) without abnormal findings: Secondary | ICD-10-CM | POA: Diagnosis not present

## 2016-08-24 DIAGNOSIS — N911 Secondary amenorrhea: Secondary | ICD-10-CM | POA: Diagnosis not present

## 2016-09-21 ENCOUNTER — Other Ambulatory Visit: Payer: Self-pay | Admitting: Neurology

## 2016-09-27 DIAGNOSIS — M25571 Pain in right ankle and joints of right foot: Secondary | ICD-10-CM | POA: Diagnosis not present

## 2016-09-27 DIAGNOSIS — M722 Plantar fascial fibromatosis: Secondary | ICD-10-CM | POA: Diagnosis not present

## 2016-10-12 ENCOUNTER — Other Ambulatory Visit: Payer: Self-pay | Admitting: Family Medicine

## 2016-11-10 ENCOUNTER — Telehealth: Payer: Self-pay | Admitting: *Deleted

## 2016-11-10 MED ORDER — LOSARTAN POTASSIUM 25 MG PO TABS: 25.0000 mg | ORAL_TABLET | Freq: Every day | ORAL | 1 refills | Status: DC

## 2016-11-10 NOTE — Telephone Encounter (Signed)
Fax received from [facility].  RAS sees pt. for migraines, and has had her on Diovan 80mg  daily, with good results.  Valsartan has been recalled.  Pharmacist rec. Losartan 25mg  daily.  RAS is ooo this wk.  SA is w/i doc this afternoon.  Per SA, ok to replace Diovan with Losartan 25mg  once daily.  Rx. escribed to [facility]/fim

## 2016-12-09 ENCOUNTER — Other Ambulatory Visit: Payer: Self-pay | Admitting: *Deleted

## 2016-12-09 ENCOUNTER — Telehealth: Payer: Self-pay | Admitting: Internal Medicine

## 2016-12-09 MED ORDER — BECLOMETHASONE DIPROPIONATE 80 MCG/ACT IN AERS: 2.0000 | INHALATION_SPRAY | Freq: Two times a day (BID) | RESPIRATORY_TRACT | 12 refills | Status: DC

## 2016-12-09 NOTE — Telephone Encounter (Signed)
Spoke with the pharmacist and this has been taken care of and pt picked up Rx on 9/6. Nothing further is needed.

## 2017-02-21 ENCOUNTER — Ambulatory Visit: Payer: 59 | Admitting: Neurology

## 2017-02-21 ENCOUNTER — Telehealth: Payer: Self-pay | Admitting: Neurology

## 2017-02-21 ENCOUNTER — Encounter: Payer: Self-pay | Admitting: Neurology

## 2017-02-21 VITALS — BP 137/91 | HR 76 | Resp 16 | Ht 65.0 in | Wt 212.5 lb

## 2017-02-21 DIAGNOSIS — I10 Essential (primary) hypertension: Secondary | ICD-10-CM | POA: Diagnosis not present

## 2017-02-21 DIAGNOSIS — R9082 White matter disease, unspecified: Secondary | ICD-10-CM | POA: Diagnosis not present

## 2017-02-21 DIAGNOSIS — G43009 Migraine without aura, not intractable, without status migrainosus: Secondary | ICD-10-CM

## 2017-02-21 DIAGNOSIS — R42 Dizziness and giddiness: Secondary | ICD-10-CM | POA: Diagnosis not present

## 2017-02-21 DIAGNOSIS — G3281 Cerebellar ataxia in diseases classified elsewhere: Secondary | ICD-10-CM

## 2017-02-21 DIAGNOSIS — I671 Cerebral aneurysm, nonruptured: Secondary | ICD-10-CM

## 2017-02-21 DIAGNOSIS — R51 Headache: Secondary | ICD-10-CM | POA: Diagnosis not present

## 2017-02-21 DIAGNOSIS — R519 Headache, unspecified: Secondary | ICD-10-CM

## 2017-02-21 NOTE — Telephone Encounter (Signed)
Patient is returning your call.  

## 2017-02-21 NOTE — Progress Notes (Signed)
[facility]  PATIENT: Ruth [name] DOB: 07-11-1960  REFERRING DOCTOR OR PCP:  Garnet Koyanagi SOURCE: patient and records and MRI images on PACS  _________________________________   HISTORICAL  CHIEF COMPLAINT:  Chief Complaint  Patient presents with  . Migraines    Was switched from Diovan to Losartan due to recall of Diovan. Sts. is doing well--about 1 h/a per wk, manageable.  Last few h/a's have resolved with Zyrtec, so she believes they were allergy related/fim  . Cerebral Aneurysm    HISTORY OF PRESENT ILLNESS:  Ruth [name] is a 56 yo woman with headaches, AComm aneurysm and white matter changes on MRI.  Update 02/21/2017:   She feels mostly stable since the last visit though the left eye has more light sensitivity.   Vision is occ blurry out of that eye but not always.    She has had floaters in that eye.    Headaches are doing well.    Recent headaches seemed allergy related and she took Zyrtec.   She is having a HA once a week, usually across her forehead.   Left is usually worse than right.     Moving and bright lights worsen the pain and resting and Zyrtec help.      Her HTN is doing ok.    She needed to switch from valsartan to losartan.     She has not had many more dizzy spells. She does feel her gait is a little off balance at times.  RI and MRA were reviewed. She has some white matter changes, mostly in the subcortical white matter. She also has a small lacune in the left cerebellar hemisphere that appears chronic.   MRA shows a small 2 mm outpouching (left Ant comm. Art.) that could be an enlarged infundibulum or a small aneurysm.   From 02/18/2016:     Abnl MRI and aneurysm: MRI 03/11/2015 showed mild chronic microvascular ischemic changes, mostly subcortical.  MR angiogram 04/04/2015 showed a 2 mm outpouching anterior communicating artery region that could be an aneurysm or an infundibulum.   CT angiogram was then performed showing a 1.5  mm ACA aneurysm with a narrow neck.  HA:   She is only getting rare headaches now.    She has had only one bad headache since starting Diovan.  She took a Goody's and it improved.   She often gets a duller pressure like headache that she feels are due to her sinuses.    The moderate headache was right sided and throbbing with nausea, photophobia.  She generally does not get visual or other aura before the headache. A typical headache will last about half a day and then resolve.    Advil or goodys will usually knock it out.      HTN:  She is doing much better on Diovan and last BP was 126/78.    Dizziness:   She had one episode of dizziness last month while on a stool.  This one lasted a few minutes only.   Over the past year she has had a couple episodes of dizziness lasting about 2 hours. During that time she feels off balanced. The vertigo was more translational than spinning. There was not lightheadedness.    Vascular risks:   She has HTN, now better controlled.   She does not have diabetes. She does not smoke.   She was never a smoker as she has asthma.   She denies any diagnosis heart problem.  She has felt that her heart skips a beat at times.    REVIEW OF SYSTEMS: Constitutional: No fevers, chills, sweats, or change in appetite Eyes: No visual changes, double vision, eye pain Ear, nose and throat: No hearing loss, ear pain, nasal congestion, sore throat Cardiovascular: No chest pain, palpitations Respiratory: No shortness of breath at rest or with exertion.   No wheezes GastrointestinaI: No nausea, vomiting, diarrhea, abdominal pain, fecal incontinence Genitourinary: No dysuria, urinary retention or frequency.  No nocturia. Musculoskeletal: No neck pain, back pain Integumentary: No rash, pruritus, skin lesions Neurological: as above Psychiatric: No depression at this time.  No anxiety Endocrine: No palpitations, diaphoresis, change in appetite, change in weigh or increased  thirst Hematologic/Lymphatic: No anemia, purpura, petechiae. Allergic/Immunologic: No itchy/runny eyes, nasal congestion, recent allergic reactions, rashes  ALLERGIES: Allergies  Allergen Reactions  . Other     Nuts     HOME MEDICATIONS:  Current Outpatient Medications:  .  albuterol (PROAIR HFA) 108 (90 Base) MCG/ACT inhaler, Inhale 2 puffs into the lungs every 6 (six) hours as needed., Disp: 18 g, Rfl: 12 .  beclomethasone (QVAR) 80 MCG/ACT inhaler, Inhale 2 puffs into the lungs 2 (two) times daily. Rinse mouth after each use., Disp: 1 Inhaler, Rfl: 12 .  cetirizine (ZYRTEC) 10 MG chewable tablet, Chew 10 mg by mouth daily. As needed., Disp: , Rfl:  .  desonide (DESOWEN) 0.05 % lotion, APPLY TO AFFECTED AREA TWICE DAILY, Disp: 118 mL, Rfl: 0 .  EPINEPHrine (EPI-PEN) 0.3 mg/0.3 mL SOAJ injection, Inject into upper thigh for severe allergic reaction, Disp: 1 Device, Rfl: prn .  fluticasone (FLONASE) 50 MCG/ACT nasal spray, PLACE 2 SPRAYS INTO BOTH NOSTRILS DAILY., Disp: 16 g, Rfl: 5 .  losartan (COZAAR) 25 MG tablet, Take 1 tablet (25 mg total) by mouth daily., Disp: 90 tablet, Rfl: 1 .  omeprazole (PRILOSEC) 40 MG capsule, Take 1 capsule (40 mg total) by mouth daily., Disp: 30 capsule, Rfl: 3 .  Mometasone Furoate 110 MCG/INH AEPB, Inhale 2 puffs into the lungs 2 (two) times daily., Disp: 1 Inhaler, Rfl: 12 .  mometasone-formoterol (DULERA) 200-5 MCG/ACT AERO, INHALE 2 PUFFS TWO TIMES DAILY THEN RINSE MOUTH., Disp: 1 Inhaler, Rfl: 12 .  Spacer/Aero-Holding Chambers (AEROCHAMBER MV) inhaler, Use as instructed, Disp: 1 each, Rfl: 0 .  valsartan (DIOVAN) 80 MG tablet, TAKE 1 TABLET (80 MG TOTAL) BY MOUTH DAILY., Disp: 30 tablet, Rfl: 4  PAST MEDICAL HISTORY: Past Medical History:  Diagnosis Date  . Allergic rhinitis, cause unspecified   . Asthma   . Eczema   . Headache     PAST SURGICAL HISTORY: Past Surgical History:  Procedure Laterality Date  . CESAREAN SECTION    . CESAREAN  SECTION Bilateral [id number]    FAMILY HISTORY: Family History  Problem Relation Age of Onset  . Asthma Mother   . Allergies Mother   . Asthma Father   . Drug abuse Father   . Allergies Father     SOCIAL HISTORY:  Social History   Socioeconomic History  . Marital status: Married    Spouse name: Not on file  . Number of children: Not on file  . Years of education: Not on file  . Highest education level: Not on file  Social Needs  . Financial resource strain: Not on file  . Food insecurity - worry: Not on file  . Food insecurity - inability: Not on file  . Transportation needs - medical: Not on file  .  Transportation needs - non-medical: Not on file  Occupational History  . 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 Smoking status: Never Smoker  . Smokeless tobacco: Never Used  Substance and Sexual Activity  . Alcohol use: Yes    Alcohol/week: 0.0 oz    Comment: one glass of wine per week/fim  . Drug use: No  . Sexual activity: Yes    Partners: Male  Other Topics Concern  . Not on file  Social History Narrative   Exercise-- walk, run 2x a week. Masters degree. Pt lives alone in a home with stairs.      PHYSICAL EXAM  Vitals:   02/21/17 0818  BP: (!) 137/91  Pulse: 76  Resp: 16  Weight: 212 lb 8 oz (96.4 kg)  Height: 5\' 5"  (1.651 m)    Body mass index is 35.36 kg/m.   General: The patient is well-developed and well-nourished and in no acute distress.  The neck and head are nontender.    Neurologic Exam  Mental status: The patient is alert and oriented x 3 at the time of the examination. The patient has apparent normal recent and remote memory, with an apparently normal attention span and concentration ability.   Speech is normal.  Cranial nerves: Extraocular movements are full.  Facial strength and sensation is normal. Trapezius strength is strong. There is no dysarthria.  The tongue is midline, and the patient has symmetric elevation  of the soft palate. No obvious hearing deficits are noted.  Motor:  Muscle bulk is normal.   Tone is normal. Strength is  5 / 5 in all 4 extremities.   Sensory: Sensory testing is intact to touch sensation in all 4 extremities.  Coordination: Cerebellar testing reveals good finger-nose-finger and heel-to-shin bilaterally.  Gait and station: Station is normal.   Gait is normal. The tandem gait is mildly wide.. Romberg is negative.   Reflexes: Deep tendon reflexes are symmetric and normal bilaterally.       DIAGNOSTIC DATA (LABS, IMAGING, TESTING) - I reviewed patient records, labs, notes, testing and imaging myself where available.  Lab Results  Component Value Date   WBC 5.7 10/30/2015   HGB 13.5 10/30/2015   HCT 40.5 10/30/2015   MCV 85.1 10/30/2015   PLT 212.0 10/30/2015      Component Value Date/Time   NA 137 10/30/2015 1144   K 3.4 (L) 10/30/2015 1144   CL 103 10/30/2015 1144   CO2 28 10/30/2015 1144   GLUCOSE 83 10/30/2015 1144   BUN 11 10/30/2015 1144   CREATININE 0.78 10/30/2015 1144   CREATININE 0.82 08/24/2013 1633   CALCIUM 8.9 10/30/2015 1144   PROT 7.4 10/30/2015 1144   ALBUMIN 3.9 10/30/2015 1144   AST 14 10/30/2015 1144   ALT 13 10/30/2015 1144   ALKPHOS 100 10/30/2015 1144   BILITOT 0.5 10/30/2015 1144   Lab Results  Component Value Date   CHOL 143 10/30/2015   HDL 73.50 10/30/2015   LDLCALC 61 10/30/2015   TRIG 45.0 10/30/2015   CHOLHDL 2 10/30/2015   No results found for: HGBA1C No results found for: VITAMINB12 Lab Results  Component Value Date   TSH 1.54 10/30/2015       ASSESSMENT AND PLAN  Aneurysm, cerebral, nonruptured  Cerebellar ataxia in diseases classified elsewhere ([place]) - Plan: MR BRAIN WO CONTRAST  Nonintractable headache, unspecified chronicity pattern, unspecified headache type - Plan: MR BRAIN WO CONTRAST  Nonruptured cerebral aneurysm -  Plan: MR MRA HEAD WO CONTRAST  White matter abnormality on MRI of  brain  Migraine without aura and without status migrainosus, not intractable  Essential hypertension  Vertigo   1.   Continue Losartan 25  mg.   We discussed weight loss and continue with less salt in diet.    2.   We need to check an MR angiogram to follow up the anterior communicating artery. If aneurysm is significantly bigger we will refer to neurosurgery or interventional radiology.   As it is small, it could also be an infundibulum.   3.    Also need to check an MRI of the brain without contrast to determine if there is an increase in the white matter changes and to better evaluate her mild gait ataxia and headaches. If there has been progression we may need to do additional testing for source of the ischemic changes. 3.    RTC 12 months if stable or sooner based on results or symptoms.     Ruth [name] A. Felecia Shelling, MD, PhD 96/78/9381, 0:17 AM Certified in Neurology, Clinical Neurophysiology, Sleep Medicine, Pain Medicine and Neuroimaging  [facility] [address], [place], [place] [address] [phone]

## 2017-02-21 NOTE — Telephone Encounter (Signed)
Patient is scheduled for 02/22/17 for the [facility] mobile unit.

## 2017-02-22 ENCOUNTER — Ambulatory Visit (INDEPENDENT_AMBULATORY_CARE_PROVIDER_SITE_OTHER): Payer: 59

## 2017-02-22 DIAGNOSIS — R51 Headache: Secondary | ICD-10-CM | POA: Diagnosis not present

## 2017-02-22 DIAGNOSIS — I671 Cerebral aneurysm, nonruptured: Secondary | ICD-10-CM

## 2017-02-22 DIAGNOSIS — G3281 Cerebellar ataxia in diseases classified elsewhere: Secondary | ICD-10-CM | POA: Diagnosis not present

## 2017-02-22 DIAGNOSIS — R519 Headache, unspecified: Secondary | ICD-10-CM

## 2017-02-24 ENCOUNTER — Telehealth: Payer: Self-pay | Admitting: *Deleted

## 2017-02-24 NOTE — Telephone Encounter (Signed)
Spoke with Ruth [name] and reviewed below MRI results.  She verbalized understanding of same/fim

## 2017-02-24 NOTE — Telephone Encounter (Signed)
-----   Message from Britt Bottom, MD sent at 02/23/2017  5:24 PM EST ----- Please let her know that the MRI of the brain showed a few small spots but the pattern was unchanged when compared to the 2016 MRI.

## 2017-03-08 ENCOUNTER — Other Ambulatory Visit: Payer: 59

## 2017-03-21 ENCOUNTER — Ambulatory Visit (INDEPENDENT_AMBULATORY_CARE_PROVIDER_SITE_OTHER): Payer: 59 | Admitting: Family Medicine

## 2017-03-21 ENCOUNTER — Encounter: Payer: Self-pay | Admitting: Family Medicine

## 2017-03-21 VITALS — BP 156/110 | HR 89 | Temp 98.1°F | Resp 16 | Ht 65.0 in | Wt 221.8 lb

## 2017-03-21 DIAGNOSIS — Z0001 Encounter for general adult medical examination with abnormal findings: Secondary | ICD-10-CM | POA: Diagnosis not present

## 2017-03-21 DIAGNOSIS — J302 Other seasonal allergic rhinitis: Secondary | ICD-10-CM | POA: Diagnosis not present

## 2017-03-21 DIAGNOSIS — I10 Essential (primary) hypertension: Secondary | ICD-10-CM | POA: Diagnosis not present

## 2017-03-21 DIAGNOSIS — Z0000 Encounter for general adult medical examination without abnormal findings: Secondary | ICD-10-CM

## 2017-03-21 LAB — CBC WITH DIFFERENTIAL/PLATELET
BASOS ABS: 0 10*3/uL (ref 0.0–0.1)
Basophils Relative: 0.8 % (ref 0.0–3.0)
Eosinophils Absolute: 0.2 10*3/uL (ref 0.0–0.7)
Eosinophils Relative: 3.9 % (ref 0.0–5.0)
HCT: 43.3 % (ref 36.0–46.0)
Hemoglobin: 14 g/dL (ref 12.0–15.0)
LYMPHS ABS: 1.9 10*3/uL (ref 0.7–4.0)
Lymphocytes Relative: 34 % (ref 12.0–46.0)
MCHC: 32.4 g/dL (ref 30.0–36.0)
MCV: 87.3 fl (ref 78.0–100.0)
Monocytes Absolute: 0.3 10*3/uL (ref 0.1–1.0)
Monocytes Relative: 5 % (ref 3.0–12.0)
NEUTROS PCT: 56.3 % (ref 43.0–77.0)
Neutro Abs: 3.1 10*3/uL (ref 1.4–7.7)
Platelets: 198 10*3/uL (ref 150.0–400.0)
RBC: 4.96 Mil/uL (ref 3.87–5.11)
RDW: 14.5 % (ref 11.5–15.5)
WBC: 5.5 10*3/uL (ref 4.0–10.5)

## 2017-03-21 LAB — POC URINALSYSI DIPSTICK (AUTOMATED)
Bilirubin, UA: NEGATIVE
Glucose, UA: NEGATIVE
KETONES UA: NEGATIVE
Leukocytes, UA: NEGATIVE
Nitrite, UA: NEGATIVE
PH UA: 7 (ref 5.0–8.0)
PROTEIN UA: NEGATIVE
RBC UA: NEGATIVE
SPEC GRAV UA: 1.015 (ref 1.010–1.025)
Urobilinogen, UA: 0.2 E.U./dL

## 2017-03-21 LAB — COMPREHENSIVE METABOLIC PANEL
ALK PHOS: 146 U/L — AB (ref 39–117)
ALT: 22 U/L (ref 0–35)
AST: 19 U/L (ref 0–37)
Albumin: 4.1 g/dL (ref 3.5–5.2)
BUN: 12 mg/dL (ref 6–23)
CO2: 31 mEq/L (ref 19–32)
Calcium: 9.3 mg/dL (ref 8.4–10.5)
Chloride: 104 mEq/L (ref 96–112)
Creatinine, Ser: 0.83 mg/dL (ref 0.40–1.20)
GFR: 92.12 mL/min (ref >60.00)
GLUCOSE: 95 mg/dL (ref 70–99)
POTASSIUM: 4 meq/L (ref 3.5–5.1)
SODIUM: 142 meq/L (ref 135–145)
TOTAL PROTEIN: 7.7 g/dL (ref 6.0–8.3)
Total Bilirubin: 0.4 mg/dL (ref 0.2–1.2)

## 2017-03-21 LAB — LIPID PANEL
CHOL/HDL RATIO: 3
Cholesterol: 153 mg/dL (ref 0–200)
HDL: 60.3 mg/dL (ref >39.00)
LDL Cholesterol: 75 mg/dL (ref 0–99)
NONHDL: 92.3
Triglycerides: 88 mg/dL (ref 0.0–149.0)
VLDL: 17.6 mg/dL (ref 0.0–40.0)

## 2017-03-21 LAB — TSH: TSH: 2.12 u[IU]/mL (ref 0.35–4.50)

## 2017-03-21 MED ORDER — AMLODIPINE BESYLATE 2.5 MG PO TABS: 2.5000 mg | ORAL_TABLET | Freq: Every day | ORAL | 3 refills | Status: DC

## 2017-03-21 MED ORDER — CETIRIZINE HCL 10 MG PO CHEW: 10.0000 mg | CHEWABLE_TABLET | Freq: Every day | ORAL | 1 refills | Status: DC

## 2017-03-21 NOTE — Patient Instructions (Signed)
Preventive Care 40-64 Years, Female Preventive care refers to lifestyle choices and visits with your health care provider that can promote health and wellness. What does preventive care include?  A yearly physical exam. This is also called an annual well check.  Dental exams once or twice a year.  Routine eye exams. Ask your health care provider how often you should have your eyes checked.  Personal lifestyle choices, including: ? Daily care of your teeth and gums. ? Regular physical activity. ? Eating a healthy diet. ? Avoiding tobacco and drug use. ? Limiting alcohol use. ? Practicing safe sex. ? Taking low-dose aspirin daily starting at age 58. ? Taking vitamin and mineral supplements as recommended by your health care provider. What happens during an annual well check? The services and screenings done by your health care provider during your annual well check will depend on your age, overall health, lifestyle risk factors, and family history of disease. Counseling Your health care provider may ask you questions about your:  Alcohol use.  Tobacco use.  Drug use.  Emotional well-being.  Home and relationship well-being.  Sexual activity.  Eating habits.  Work and work Statistician.  Method of birth control.  Menstrual cycle.  Pregnancy history.  Screening You may have the following tests or measurements:  Height, weight, and BMI.  Blood pressure.  Lipid and cholesterol levels. These may be checked every 5 years, or more frequently if you are over 81 years old.  Skin check.  Lung cancer screening. You may have this screening every year starting at age 78 if you have a 30-pack-year history of smoking and currently smoke or have quit within the past 15 years.  Fecal occult blood test (FOBT) of the stool. You may have this test every year starting at age 65.  Flexible sigmoidoscopy or colonoscopy. You may have a sigmoidoscopy every 5 years or a colonoscopy  every 10 years starting at age 30.  Hepatitis C blood test.  Hepatitis B blood test.  Sexually transmitted disease (STD) testing.  Diabetes screening. This is done by checking your blood sugar (glucose) after you have not eaten for a while (fasting). You may have this done every 1-3 years.  Mammogram. This may be done every 1-2 years. Talk to your health care provider about when you should start having regular mammograms. This may depend on whether you have a family history of breast cancer.  BRCA-related cancer screening. This may be done if you have a family history of breast, ovarian, tubal, or peritoneal cancers.  Pelvic exam and Pap test. This may be done every 3 years starting at age 80. Starting at age 36, this may be done every 5 years if you have a Pap test in combination with an HPV test.  Bone density scan. This is done to screen for osteoporosis. You may have this scan if you are at high risk for osteoporosis.  Discuss your test results, treatment options, and if necessary, the need for more tests with your health care provider. Vaccines Your health care provider may recommend certain vaccines, such as:  Influenza vaccine. This is recommended every year.  Tetanus, diphtheria, and acellular pertussis (Tdap, Td) vaccine. You may need a Td booster every 10 years.  Varicella vaccine. You may need this if you have not been vaccinated.  Zoster vaccine. You may need this after age 5.  Measles, mumps, and rubella (MMR) vaccine. You may need at least one dose of MMR if you were born in  1957 or later. You may also need a second dose.  Pneumococcal 13-valent conjugate (PCV13) vaccine. You may need this if you have certain conditions and were not previously vaccinated.  Pneumococcal polysaccharide (PPSV23) vaccine. You may need one or two doses if you smoke cigarettes or if you have certain conditions.  Meningococcal vaccine. You may need this if you have certain  conditions.  Hepatitis A vaccine. You may need this if you have certain conditions or if you travel or work in places where you may be exposed to hepatitis A.  Hepatitis B vaccine. You may need this if you have certain conditions or if you travel or work in places where you may be exposed to hepatitis B.  Haemophilus influenzae type b (Hib) vaccine. You may need this if you have certain conditions.  Talk to your health care provider about which screenings and vaccines you need and how often you need them. This information is not intended to replace advice given to you by your health care provider. Make sure you discuss any questions you have with your health care provider. Document Released: 04/11/2015 Document Revised: 12/03/2015 Document Reviewed: 01/14/2015 Elsevier Interactive Patient Education  2018 Elsevier Inc.  

## 2017-03-21 NOTE — Progress Notes (Signed)
Subjective:     Ruth [name] is a 56 y.o. female and is here for a comprehensive physical exam. The patient reports no problems.  Pt sees gyn-- Dr Jacqualyn Posey  Social History   Socioeconomic History  . Marital status: Married    Spouse name: Not on file  . Number of children: Not on file  . Years of education: Not on file  . Highest education level: Not on file  Social Needs  . Financial resource strain: Not on file  . Food insecurity - worry: Not on file  . Food insecurity - inability: Not on file  . Transportation needs - medical: Not on file  . Transportation needs - non-medical: Not on file  Occupational History    Comment: guard   Tobacco Use  . Smoking status: Never Smoker  . Smokeless tobacco: Never Used  Substance and Sexual Activity  . Alcohol use: Yes    Alcohol/week: 0.0 oz    Comment: one glass of wine per week/fim  . Drug use: No  . Sexual activity: Yes    Partners: Male  Other Topics Concern  . Not on file  Social History Narrative   Exercise--no   Health Maintenance  Topic Date Due  . COLONOSCOPY  03/11/2013  . MAMMOGRAM  05/21/2016  . INFLUENZA VACCINE  11/22/2027 (Originally 10/27/2016)  . PAP SMEAR  04/29/2017  . TETANUS/TDAP  06/08/2022  . Hepatitis C Screening  Completed  . HIV Screening  Completed    The following portions of the patient's history were reviewed and updated as appropriate:  She  has a past medical history of Allergic rhinitis, cause unspecified, Asthma, Eczema, and Headache. She does not have any pertinent problems on file. She  has a past surgical history that includes Cesarean section and Cesarean section (Bilateral, [id number]). Her family history includes Allergies in her father and mother; Asthma in her father and mother; Drug abuse in her father. She  reports that  has never smoked. she has never used smokeless tobacco. She reports that she drinks alcohol. She reports that she does not use drugs. She has a current medication  list which includes the following prescription(s): albuterol, beclomethasone, cetirizine, desonide, epinephrine, fluticasone, mometasone-formoterol, omeprazole, aerochamber mv, and amlodipine. Current Outpatient Medications on File Prior to Visit  Medication Sig Dispense Refill  . albuterol (PROAIR HFA) 108 (90 Base) MCG/ACT inhaler Inhale 2 puffs into the lungs every 6 (six) hours as needed. 18 g 12  . beclomethasone (QVAR) 80 MCG/ACT inhaler Inhale 2 puffs into the lungs 2 (two) times daily. Rinse mouth after each use. 1 Inhaler 12  . desonide (DESOWEN) 0.05 % lotion APPLY TO AFFECTED AREA TWICE DAILY 118 mL 0  . EPINEPHrine (EPI-PEN) 0.3 mg/0.3 mL SOAJ injection Inject into upper thigh for severe allergic reaction 1 Device prn  . fluticasone (FLONASE) 50 MCG/ACT nasal spray PLACE 2 SPRAYS INTO BOTH NOSTRILS DAILY. 16 g 5  . mometasone-formoterol (DULERA) 200-5 MCG/ACT AERO INHALE 2 PUFFS TWO TIMES DAILY THEN RINSE MOUTH. 1 Inhaler 12  . omeprazole (PRILOSEC) 40 MG capsule Take 1 capsule (40 mg total) by mouth daily. 30 capsule 3  . Spacer/Aero-Holding Chambers (AEROCHAMBER MV) inhaler Use as instructed 1 each 0   No current facility-administered medications on file prior to visit.    She is allergic to other..  Review of Systems Review of Systems  Constitutional: Negative for activity change, appetite change and fatigue.  HENT: Negative for hearing loss, congestion, tinnitus and ear discharge.  dentist q62m Eyes: Negative for visual disturbance (see optho q1y -- vision corrected to 20/20 with glasses).  Respiratory: Negative for cough, chest tightness and shortness of breath.   Cardiovascular: Negative for chest pain, palpitations and leg swelling.  Gastrointestinal: Negative for abdominal pain, diarrhea, constipation and abdominal distention.  Genitourinary: Negative for urgency, frequency, decreased urine volume and difficulty urinating.  Musculoskeletal: Negative for back pain,  arthralgias and gait problem.  Skin: Negative for color change, pallor and rash.  Neurological: Negative for dizziness, light-headedness, numbness and headaches.  Hematological: Negative for adenopathy. Does not bruise/bleed easily.  Psychiatric/Behavioral: Negative for suicidal ideas, confusion, sleep disturbance, self-injury, dysphoric mood, decreased concentration and agitation.       Objective:    BP (!) 156/110   Pulse 89   Temp 98.1 F (36.7 C) (Oral)   Resp 16   Ht 5\' 5"  (1.651 m)   Wt 221 lb 12.8 oz (100.6 kg)   LMP 09/19/2015   SpO2 98%   BMI 36.91 kg/m  General appearance: alert, cooperative, appears stated age and no distress Head: Normocephalic, without obvious abnormality, atraumatic Eyes: conjunctivae/corneas clear. PERRL, EOM's intact. Fundi benign. Ears: normal TM's and external ear canals both ears Nose: Nares normal. Septum midline. Mucosa normal. No drainage or sinus tenderness. Throat: lips, mucosa, and tongue normal; teeth and gums normal Neck: no adenopathy, no carotid bruit, no JVD, supple, symmetrical, trachea midline and thyroid not enlarged, symmetric, no tenderness/mass/nodules Back: symmetric, no curvature. ROM normal. No CVA tenderness. Lungs: clear to auscultation bilaterally Breasts: gyn Heart: S1, S2 normal Abdomen: soft, non-tender; bowel sounds normal; no masses,  no organomegaly Pelvic: deferred--gyn Extremities: extremities normal, atraumatic, no cyanosis or edema Pulses: 2+ and symmetric Skin: Skin color, texture, turgor normal. No rashes or lesions Lymph nodes: Cervical, supraclavicular, and axillary nodes normal. Neurologic: Alert and oriented X 3, normal strength and tone. Normal symmetric reflexes. Normal coordination and gait    Assessment:    Healthy female exam.      Plan:    ghm utd Check labs See After Visit Summary for Counseling Recommendations    1. Essential hypertension Poorly controlled will alter medications,  encouraged DASH diet, minimize caffeine and obtain adequate sleep. Report concerning symptoms and follow up as directed and as needed - amLODipine (NORVASC) 2.5 MG tablet; Take 1 tablet (2.5 mg total) by mouth daily.  Dispense: 90 tablet; Refill: 3 D/c losartan == pt preference 2. Preventative health care See AVS ghm utd Check labs  - POCT Urinalysis Dipstick (Automated) - CBC with Differential/Platelet - Comprehensive metabolic panel - Lipid panel - TSH  3. Seasonal allergies stable - cetirizine (ZYRTEC) 10 MG chewable tablet; Chew 1 tablet (10 mg total) by mouth daily. As needed.  Dispense: 90 tablet; Refill: 1

## 2017-03-23 ENCOUNTER — Telehealth: Payer: Self-pay | Admitting: Family Medicine

## 2017-03-23 ENCOUNTER — Telehealth: Payer: Self-pay

## 2017-03-23 DIAGNOSIS — Z1231 Encounter for screening mammogram for malignant neoplasm of breast: Secondary | ICD-10-CM

## 2017-03-23 NOTE — Telephone Encounter (Signed)
Copied from [place]. Topic: Referral - Request >> Mar 23, 2017  1:36 PM Hewitt Shorts wrote: Reason for CRM:pt is needing to get a referral to have a mammogram

## 2017-03-23 NOTE — Telephone Encounter (Signed)
Orders placed.

## 2017-03-23 NOTE — Telephone Encounter (Signed)
Copied from [place]. Topic: Referral - Request >> Mar 23, 2017  1:36 PM Hewitt Shorts wrote: Reason for CRM:pt is needing to get a referral to have a mammogram  best number  [phone]

## 2017-04-04 DIAGNOSIS — Z1212 Encounter for screening for malignant neoplasm of rectum: Secondary | ICD-10-CM | POA: Diagnosis not present

## 2017-04-04 DIAGNOSIS — Z1211 Encounter for screening for malignant neoplasm of colon: Secondary | ICD-10-CM | POA: Diagnosis not present

## 2017-04-12 LAB — COLOGUARD: Cologuard: NEGATIVE

## 2017-04-18 ENCOUNTER — Ambulatory Visit: Payer: 59 | Admitting: Family Medicine

## 2017-04-18 ENCOUNTER — Telehealth: Payer: Self-pay | Admitting: *Deleted

## 2017-04-18 ENCOUNTER — Ambulatory Visit: Payer: 59

## 2017-04-18 ENCOUNTER — Encounter: Payer: Self-pay | Admitting: Family Medicine

## 2017-04-18 VITALS — BP 147/102 | HR 90 | Temp 98.1°F | Resp 16 | Ht 65.0 in | Wt 220.0 lb

## 2017-04-18 DIAGNOSIS — R748 Abnormal levels of other serum enzymes: Secondary | ICD-10-CM

## 2017-04-18 DIAGNOSIS — I10 Essential (primary) hypertension: Secondary | ICD-10-CM

## 2017-04-18 LAB — COMPREHENSIVE METABOLIC PANEL
ALT: 23 U/L (ref 0–35)
AST: 19 U/L (ref 0–37)
Albumin: 3.8 g/dL (ref 3.5–5.2)
Alkaline Phosphatase: 120 U/L — ABNORMAL HIGH (ref 39–117)
BUN: 17 mg/dL (ref 6–23)
CO2: 32 meq/L (ref 19–32)
Calcium: 8.9 mg/dL (ref 8.4–10.5)
Chloride: 102 mEq/L (ref 96–112)
Creatinine, Ser: 0.85 mg/dL (ref 0.40–1.20)
GFR: 89.6 mL/min (ref >60.00)
GLUCOSE: 89 mg/dL (ref 70–99)
POTASSIUM: 3.4 meq/L — AB (ref 3.5–5.1)
Sodium: 139 mEq/L (ref 135–145)
Total Bilirubin: 0.5 mg/dL (ref 0.2–1.2)
Total Protein: 6.9 g/dL (ref 6.0–8.3)

## 2017-04-18 MED ORDER — AMLODIPINE BESYLATE 5 MG PO TABS: 5.0000 mg | ORAL_TABLET | Freq: Every day | ORAL | 3 refills | Status: DC

## 2017-04-18 NOTE — Assessment & Plan Note (Signed)
Inc norvasc to 5 mg bp check in 2-3 weeks Dash diet

## 2017-04-18 NOTE — Progress Notes (Signed)
Patient ID: Ruth [name], female    DOB: [birth year]  Age: 57 y.o. MRN: [id number]    Subjective:  Subjective  HPI Ruth [name] presents for f/u bp.  No complaints.  Her alk phos was high in last labs---  Pt hx cholelithiasis w/o cholecystitis    Review of Systems  Constitutional: Negative for activity change, appetite change, fatigue and unexpected weight change.  Respiratory: Negative for cough and shortness of breath.   Cardiovascular: Negative for chest pain and palpitations.  Gastrointestinal: Negative for abdominal distention, abdominal pain, nausea and vomiting.  Psychiatric/Behavioral: Negative for behavioral problems and dysphoric mood. The patient is not nervous/anxious.     History Past Medical History:  Diagnosis Date  . Allergic rhinitis, cause unspecified   . Asthma   . Eczema   . Headache     She has a past surgical history that includes Cesarean section and Cesarean section (Bilateral, [id number]).   Her family history includes Allergies in her father and mother; Asthma in her father and mother; Drug abuse in her father.She reports that  has never smoked. she has never used smokeless tobacco. She reports that she drinks alcohol. She reports that she does not use drugs.  Current Outpatient Medications on File Prior to Visit  Medication Sig Dispense Refill  . albuterol (PROAIR HFA) 108 (90 Base) MCG/ACT inhaler Inhale 2 puffs into the lungs every 6 (six) hours as needed. 18 g 12  . beclomethasone (QVAR) 80 MCG/ACT inhaler Inhale 2 puffs into the lungs 2 (two) times daily. Rinse mouth after each use. 1 Inhaler 12  . cetirizine (ZYRTEC) 10 MG chewable tablet Chew 1 tablet (10 mg total) by mouth daily. As needed. 90 tablet 1  . desonide (DESOWEN) 0.05 % lotion APPLY TO AFFECTED AREA TWICE DAILY 118 mL 0  . EPINEPHrine (EPI-PEN) 0.3 mg/0.3 mL SOAJ injection Inject into upper thigh for severe allergic reaction 1 Device prn  . fluticasone (FLONASE) 50 MCG/ACT nasal  spray PLACE 2 SPRAYS INTO BOTH NOSTRILS DAILY. 16 g 5  . mometasone-formoterol (DULERA) 200-5 MCG/ACT AERO INHALE 2 PUFFS TWO TIMES DAILY THEN RINSE MOUTH. 1 Inhaler 12  . omeprazole (PRILOSEC) 40 MG capsule Take 1 capsule (40 mg total) by mouth daily. 30 capsule 3  . Spacer/Aero-Holding Chambers (AEROCHAMBER MV) inhaler Use as instructed 1 each 0   No current facility-administered medications on file prior to visit.      Objective:  Objective  Physical Exam  Constitutional: She is oriented to person, place, and time. She appears well-developed and well-nourished.  HENT:  Head: Normocephalic and atraumatic.  Eyes: Conjunctivae and EOM are normal.  Neck: Normal range of motion. Neck supple. No JVD present. Carotid bruit is not present. No thyromegaly present.  Cardiovascular: Normal rate, regular rhythm and normal heart sounds.  No murmur heard. Pulmonary/Chest: Effort normal and breath sounds normal. No respiratory distress. She has no wheezes. She has no rales. She exhibits no tenderness.  Abdominal: Soft. She exhibits no distension and no mass. There is no tenderness. There is no rebound and no guarding.  Musculoskeletal: She exhibits no edema.  Neurological: She is alert and oriented to person, place, and time.  Psychiatric: She has a normal mood and affect.  Nursing note and vitals reviewed.  BP (!) 147/102 (BP Location: Left Arm, Patient Position: Sitting, Cuff Size: Large)   Pulse 90   Temp 98.1 F (36.7 C) (Oral)   Resp 16   Ht 5' 5"  (1.651 m)  Wt 220 lb (99.8 kg)   LMP 09/19/2015   SpO2 100%   BMI 36.61 kg/m  Wt Readings from Last 3 Encounters:  04/18/17 220 lb (99.8 kg)  03/21/17 221 lb 12.8 oz (100.6 kg)  02/21/17 212 lb 8 oz (96.4 kg)     Lab Results  Component Value Date   WBC 5.5 03/21/2017   HGB 14.0 03/21/2017   HCT 43.3 03/21/2017   PLT 198.0 03/21/2017   GLUCOSE 95 03/21/2017   CHOL 153 03/21/2017   TRIG 88.0 03/21/2017   HDL 60.30 03/21/2017    LDLCALC 75 03/21/2017   ALT 22 03/21/2017   AST 19 03/21/2017   NA 142 03/21/2017   K 4.0 03/21/2017   CL 104 03/21/2017   CREATININE 0.83 03/21/2017   BUN 12 03/21/2017   CO2 31 03/21/2017   TSH 2.12 03/21/2017   MICROALBUR 1.1 07/03/2012    Ct Angio Head W/cm &/or Wo Cm  Result Date: 04/16/2015 CLINICAL DATA:  Abnormal MRI and. Anterior communicating artery aneurysm versus infundibulum. EXAM: CT ANGIOGRAPHY HEAD TECHNIQUE: Multidetector CT imaging of the head was performed using the standard protocol during bolus administration of intravenous contrast. Multiplanar CT image reconstructions and MIPs were obtained to evaluate the vascular anatomy. CONTRAST:  100 mL Isovue 370 COMPARISON:  MRA circle of Willis 04/04/2015 FINDINGS: CT HEAD Brain: Mild periventricular white matter hypoattenuation is again noted. No acute cortical infarct is present. The basal ganglia are intact. The ventricles are of normal size. No significant extra-axial fluid collection is present. Calvarium and skull base: The skullbase is within normal limits. Paranasal sinuses: The paranasal sinuses and mastoid air cells are clear. Orbits: Within normal limits. 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 No focal stenosis is evident through the skullbase. The ICA terminus is within normal limits. The left internal carotid artery is within normal limits to the ICA terminus. The left A1 segment feeds both ACA vessels with a patent anterior communicating artery. 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 There is no definitive vessel associated with this area at to identify this is an infundibulum. The MCA bifurcations are within normal limits bilaterally. The ACA and MCA branch vessels are unremarkable. Posterior circulation: The vertebral arteries are codominant. Both PICA origins are visualized and normal. The basilar  artery is normal. The posterior cerebral arteries both originate from the basilar tip. The PCA branch vessels are within normal limits. Venous sinuses: The dural sinuses are normally patent. The straight sinus and deep cerebral veins are intact. The left transverse sinus is dominant. The cortical veins are unremarkable. Anatomic variants: A prominent left posterior communicating artery is patent. Delayed phase:The postcontrast images demonstrate no pathologic enhancement. IMPRESSION: 1. 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 2. Otherwise normal CTA circle of Willis without focal stenosis, other aneurysm, or branch vessel occlusion. 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 Electronically Signed   By: San Morelle M.D.   On: 04/16/2015 11:44     Assessment & Plan:  Plan  I have discontinued Ruth [name]'s amLODipine. I am also having her start on amLODipine. Additionally, I am having her maintain her EPINEPHrine, omeprazole, albuterol, mometasone-formoterol, AEROCHAMBER MV, fluticasone, desonide, beclomethasone, and cetirizine.  Meds ordered this encounter  Medications  . amLODipine (NORVASC) 5 MG tablet    Sig: Take 1 tablet (5 mg total) by mouth daily.  Dispense:  90 tablet    Refill:  3    Problem List Items Addressed This Visit      Unprioritized   HTN (hypertension) - Primary    Inc norvasc to 5 mg bp check in 2-3 weeks Dash diet       Relevant Medications   amLODipine (NORVASC) 5 MG tablet    Other Visit Diagnoses    Elevated alkaline phosphatase level       Relevant Orders   Comprehensive metabolic panel      Follow-up: Return in about 3 weeks (around 05/09/2017), or if symptoms worsen or fail to improve.  Ann Held, DO

## 2017-04-18 NOTE — Telephone Encounter (Signed)
ReceivedCologuard results from [facility]; forwarded to provider/SLS 01/21

## 2017-04-18 NOTE — Patient Instructions (Signed)
DASH Eating Plan DASH stands for "Dietary Approaches to Stop Hypertension." The DASH eating plan is a healthy eating plan that has been shown to reduce high blood pressure (hypertension). It may also reduce your risk for type 2 diabetes, heart disease, and stroke. The DASH eating plan may also help with weight loss. What are tips for following this plan? General guidelines  Avoid eating more than 2,300 mg (milligrams) of salt (sodium) a day. If you have hypertension, you may need to reduce your sodium intake to 1,500 mg a day.  Limit alcohol intake to no more than 1 drink a day for nonpregnant women and 2 drinks a day for men. One drink equals 12 oz of beer, 5 oz of wine, or 1 oz of hard liquor.  Work with your health care provider to maintain a healthy body weight or to lose weight. Ask what an ideal weight is for you.  Get at least 30 minutes of exercise that causes your heart to beat faster (aerobic exercise) most days of the week. Activities may include walking, swimming, or biking.  Work with your health care provider or diet and nutrition specialist (dietitian) to adjust your eating plan to your individual calorie needs. Reading food labels  Check food labels for the amount of sodium per serving. Choose foods with less than 5 percent of the Daily Value of sodium. Generally, foods with less than 300 mg of sodium per serving fit into this eating plan.  To find whole grains, look for the word "whole" as the first word in the ingredient list. Shopping  Buy products labeled as "low-sodium" or "no salt added."  Buy fresh foods. Avoid canned foods and premade or frozen meals. Cooking  Avoid adding salt when cooking. Use salt-free seasonings or herbs instead of table salt or sea salt. Check with your health care provider or pharmacist before using salt substitutes.  Do not fry foods. Cook foods using healthy methods such as baking, boiling, grilling, and broiling instead.  Cook with  heart-healthy oils, such as olive, canola, soybean, or sunflower oil. Meal planning   Eat a balanced diet that includes: ? 5 or more servings of fruits and vegetables each day. At each meal, try to fill half of your plate with fruits and vegetables. ? Up to 6-8 servings of whole grains each day. ? Less than 6 oz of lean meat, poultry, or fish each day. A 3-oz serving of meat is about the same size as a deck of cards. One egg equals 1 oz. ? 2 servings of low-fat dairy each day. ? A serving of nuts, seeds, or beans 5 times each week. ? Heart-healthy fats. Healthy fats called Omega-3 fatty acids are found in foods such as flaxseeds and coldwater fish, like sardines, salmon, and mackerel.  Limit how much you eat of the following: ? Canned or prepackaged foods. ? Food that is high in trans fat, such as fried foods. ? Food that is high in saturated fat, such as fatty meat. ? Sweets, desserts, sugary drinks, and other foods with added sugar. ? Full-fat dairy products.  Do not salt foods before eating.  Try to eat at least 2 vegetarian meals each week.  Eat more home-cooked food and less restaurant, buffet, and fast food.  When eating at a restaurant, ask that your food be prepared with less salt or no salt, if possible. What foods are recommended? The items listed may not be a complete list. Talk with your dietitian about what   dietary choices are best for you. Grains Whole-grain or whole-wheat bread. Whole-grain or whole-wheat pasta. Brown rice. Oatmeal. Quinoa. Bulgur. Whole-grain and low-sodium cereals. Pita bread. Low-fat, low-sodium crackers. Whole-wheat flour tortillas. Vegetables Fresh or frozen vegetables (raw, steamed, roasted, or grilled). Low-sodium or reduced-sodium tomato and vegetable juice. Low-sodium or reduced-sodium tomato sauce and tomato paste. Low-sodium or reduced-sodium canned vegetables. Fruits All fresh, dried, or frozen fruit. Canned fruit in natural juice (without  added sugar). Meat and other protein foods Skinless chicken or turkey. Ground chicken or turkey. Pork with fat trimmed off. Fish and seafood. Egg whites. Dried beans, peas, or lentils. Unsalted nuts, nut butters, and seeds. Unsalted canned beans. Lean cuts of beef with fat trimmed off. Low-sodium, lean deli meat. Dairy Low-fat (1%) or fat-free (skim) milk. Fat-free, low-fat, or reduced-fat cheeses. Nonfat, low-sodium ricotta or cottage cheese. Low-fat or nonfat yogurt. Low-fat, low-sodium cheese. Fats and oils Soft margarine without trans fats. Vegetable oil. Low-fat, reduced-fat, or light mayonnaise and salad dressings (reduced-sodium). Canola, safflower, olive, soybean, and sunflower oils. Avocado. Seasoning and other foods Herbs. Spices. Seasoning mixes without salt. Unsalted popcorn and pretzels. Fat-free sweets. What foods are not recommended? The items listed may not be a complete list. Talk with your dietitian about what dietary choices are best for you. Grains Baked goods made with fat, such as croissants, muffins, or some breads. Dry pasta or rice meal packs. Vegetables Creamed or fried vegetables. Vegetables in a cheese sauce. Regular canned vegetables (not low-sodium or reduced-sodium). Regular canned tomato sauce and paste (not low-sodium or reduced-sodium). Regular tomato and vegetable juice (not low-sodium or reduced-sodium). Pickles. Olives. Fruits Canned fruit in a light or heavy syrup. Fried fruit. Fruit in cream or butter sauce. Meat and other protein foods Fatty cuts of meat. Ribs. Fried meat. Bacon. Sausage. Bologna and other processed lunch meats. Salami. Fatback. Hotdogs. Bratwurst. Salted nuts and seeds. Canned beans with added salt. Canned or smoked fish. Whole eggs or egg yolks. Chicken or turkey with skin. Dairy Whole or 2% milk, cream, and half-and-half. Whole or full-fat cream cheese. Whole-fat or sweetened yogurt. Full-fat cheese. Nondairy creamers. Whipped toppings.  Processed cheese and cheese spreads. Fats and oils Butter. Stick margarine. Lard. Shortening. Ghee. Bacon fat. Tropical oils, such as coconut, palm kernel, or palm oil. Seasoning and other foods Salted popcorn and pretzels. Onion salt, garlic salt, seasoned salt, table salt, and sea salt. Worcestershire sauce. Tartar sauce. Barbecue sauce. Teriyaki sauce. Soy sauce, including reduced-sodium. Steak sauce. Canned and packaged gravies. Fish sauce. Oyster sauce. Cocktail sauce. Horseradish that you find on the shelf. Ketchup. Mustard. Meat flavorings and tenderizers. Bouillon cubes. Hot sauce and Tabasco sauce. Premade or packaged marinades. Premade or packaged taco seasonings. Relishes. Regular salad dressings. Where to find more information:  National Heart, Lung, and Blood Institute: www.nhlbi.nih.gov  American Heart Association: www.heart.org Summary  The DASH eating plan is a healthy eating plan that has been shown to reduce high blood pressure (hypertension). It may also reduce your risk for type 2 diabetes, heart disease, and stroke.  With the DASH eating plan, you should limit salt (sodium) intake to 2,300 mg a day. If you have hypertension, you may need to reduce your sodium intake to 1,500 mg a day.  When on the DASH eating plan, aim to eat more fresh fruits and vegetables, whole grains, lean proteins, low-fat dairy, and heart-healthy fats.  Work with your health care provider or diet and nutrition specialist (dietitian) to adjust your eating plan to your individual   calorie needs. This information is not intended to replace advice given to you by your health care provider. Make sure you discuss any questions you have with your health care provider. Document Released: 03/04/2011 Document Revised: 03/08/2016 Document Reviewed: 03/08/2016 Elsevier Interactive Patient Education  2018 [employer]  

## 2017-05-05 ENCOUNTER — Encounter: Payer: Self-pay | Admitting: *Deleted

## 2017-05-05 ENCOUNTER — Telehealth: Payer: Self-pay | Admitting: Family Medicine

## 2017-05-05 ENCOUNTER — Ambulatory Visit: Payer: 59

## 2017-05-05 DIAGNOSIS — I10 Essential (primary) hypertension: Secondary | ICD-10-CM

## 2017-05-05 MED ORDER — AMLODIPINE BESYLATE 10 MG PO TABS: 10.0000 mg | ORAL_TABLET | Freq: Every day | ORAL | 3 refills | Status: DC

## 2017-05-05 NOTE — Telephone Encounter (Signed)
Copied from [place]. Topic: Quick Communication - See Telephone Encounter >> May 05, 2017  3:08 PM Ether Griffins B wrote: CRM for notification. See Telephone encounter for:  Pt was just in the office for BP check. She is wanting to know about the dosage of BP meds. She would like to know if Dr. Etter Sjogren will allow her to continue on the dose she is on now and include exercise in her daily routine and re assess once she comes back in March for her BP check.  05/05/17.

## 2017-05-05 NOTE — Telephone Encounter (Signed)
Her bp was high---  She should be on 10 unless she was not taking the med daily as prescribed

## 2017-05-05 NOTE — Progress Notes (Signed)
Patient came in today for nurse visit Blood Pressure check after increasing amlodipine to 5 mg. Patients BP was 143/92 P-101 O2-100%   Dr. Etter Sjogren would like to increase amlodipine to 10 mg daily and follow up with her in 3 weeks. I have scheduled patient follow up visit. Patient has no questions or concerns at the time.

## 2017-05-05 NOTE — Telephone Encounter (Signed)
When in the office for blood pressure check (see previous encounter from today also)  patient had no questions or concerns regarding adjusting dose. Patient was informed we will send in new dose and she verbalized understanding. Later today, she called the pec and states she just wants to continue 5mg  dose with diet and exercise. Her 5 mg dose has already been discontinued and new dose has been sent in. Please advise.

## 2017-05-16 ENCOUNTER — Other Ambulatory Visit: Payer: Self-pay | Admitting: Family Medicine

## 2017-05-16 DIAGNOSIS — J302 Other seasonal allergic rhinitis: Secondary | ICD-10-CM

## 2017-05-16 NOTE — Telephone Encounter (Signed)
Left message to return call 

## 2017-05-17 NOTE — Telephone Encounter (Signed)
Talked to patient and she will like to try diet and exercise before increasing medication. She has an appointment coming up on the 28th.

## 2017-05-19 ENCOUNTER — Encounter: Payer: Self-pay | Admitting: Family Medicine

## 2017-05-19 ENCOUNTER — Other Ambulatory Visit: Payer: Self-pay | Admitting: Family Medicine

## 2017-05-19 ENCOUNTER — Ambulatory Visit: Payer: 59 | Admitting: Family Medicine

## 2017-05-19 VITALS — BP 136/88 | HR 90 | Temp 98.1°F | Resp 16 | Ht 65.0 in | Wt 216.8 lb

## 2017-05-19 DIAGNOSIS — B001 Herpesviral vesicular dermatitis: Secondary | ICD-10-CM | POA: Diagnosis not present

## 2017-05-19 DIAGNOSIS — J324 Chronic pansinusitis: Secondary | ICD-10-CM | POA: Diagnosis not present

## 2017-05-19 MED ORDER — PREDNISONE 10 MG PO TABS: ORAL_TABLET | ORAL | 0 refills | Status: DC

## 2017-05-19 MED ORDER — AMOXICILLIN-POT CLAVULANATE 875-125 MG PO TABS: 1.0000 | ORAL_TABLET | Freq: Two times a day (BID) | ORAL | 0 refills | Status: DC

## 2017-05-19 MED ORDER — VALACYCLOVIR HCL 1 G PO TABS: 1000.0000 mg | ORAL_TABLET | Freq: Three times a day (TID) | ORAL | 3 refills | Status: DC

## 2017-05-19 MED FILL — AMOX-CLAV 875-125 MG TABLET: 875-125 | 10 days supply | Qty: 20 | Fill #0

## 2017-05-19 MED FILL — valACYclovir HCL 1 GM TABS: 1 | 10 days supply | Qty: 30 | Fill #0

## 2017-05-19 MED FILL — predniSONE 10 MG TABS: 10 | 12 days supply | Qty: 20 | Fill #0

## 2017-05-19 NOTE — Progress Notes (Signed)
Patient ID: Ruth [name], female   DOB: 06-Apr-1960, 57 y.o.   MRN: [id number]    Subjective:  I acted as a Education administrator for Dr. Carollee Herter.  Ruth [name], Ruth [name]   Patient ID: Ruth [name], female    DOB: [birth year], 57 y.o.   MRN: [id number]  Chief Complaint  Patient presents with  . Cough    Cough  This is a new problem. Episode onset: monday. The cough is productive of sputum. Associated symptoms include chills, ear congestion, nasal congestion, postnasal drip, rhinorrhea and a sore throat. Pertinent negatives include no chest pain, ear pain, fever, headaches, rash, shortness of breath or wheezing. Treatments tried: zyrtec, flonase,     Patient is in today for cough.  Patient Care Team: Carollee Herter, Alferd Apa, DO as PCP - General Clydie Braun, MD as Consulting Physician (Obstetrics and Gynecology) Felecia Shelling Nanine Means, MD as Consulting Physician (Neurology)   Past Medical History:  Diagnosis Date  . Allergic rhinitis, cause unspecified   . Asthma   . Eczema   . Headache     Past Surgical History:  Procedure Laterality Date  . CESAREAN SECTION    . CESAREAN SECTION Bilateral [id number]    Family History  Problem Relation Age of Onset  . Asthma Mother   . Allergies Mother   . Asthma Father   . Drug abuse Father   . Allergies Father     Social History   Socioeconomic History  . Marital status: Married    Spouse name: Not on file  . Number of children: Not on file  . Years of education: Not on file  . Highest education level: Not on file  Social Needs  . Financial resource strain: Not on file  . Food insecurity - worry: Not on file  . Food insecurity - inability: Not on file  . Transportation needs - medical: Not on file  . Transportation needs - non-medical: Not on file  Occupational History    Comment: guard   Tobacco Use  . Smoking status: Never Smoker  . Smokeless tobacco: Never Used  Substance and Sexual Activity  . Alcohol use: Yes    Alcohol/week: 0.0 oz     Comment: one glass of wine per week/fim  . Drug use: No  . Sexual activity: Yes    Partners: Male  Other Topics Concern  . Not on file  Social History Narrative   Exercise--no    Outpatient Medications Prior to Visit  Medication Sig Dispense Refill  . albuterol (PROAIR HFA) 108 (90 Base) MCG/ACT inhaler Inhale 2 puffs into the lungs every 6 (six) hours as needed. 18 g 12  . amLODipine (NORVASC) 10 MG tablet Take 1 tablet (10 mg total) by mouth daily. 90 tablet 3  . beclomethasone (QVAR) 80 MCG/ACT inhaler Inhale 2 puffs into the lungs 2 (two) times daily. Rinse mouth after each use. 1 Inhaler 12  . cetirizine (ZYRTEC) 10 MG chewable tablet Chew 1 tablet (10 mg total) by mouth daily. As needed. 90 tablet 1  . desonide (DESOWEN) 0.05 % lotion APPLY TO AFFECTED AREA TWICE DAILY 118 mL 0  . EPINEPHrine (EPI-PEN) 0.3 mg/0.3 mL SOAJ injection Inject into upper thigh for severe allergic reaction 1 Device prn  . fluticasone (FLONASE) 50 MCG/ACT nasal spray 2 SPRAYS EACH NOSTRIL EVERY DAY 16 g 2  . omeprazole (PRILOSEC) 40 MG capsule Take 1 capsule (40 mg total) by mouth daily. 30 capsule 3  . Spacer/Aero-Holding Chambers (AEROCHAMBER MV) inhaler  Use as instructed 1 each 0  . mometasone-formoterol (DULERA) 200-5 MCG/ACT AERO INHALE 2 PUFFS TWO TIMES DAILY THEN RINSE MOUTH. 1 Inhaler 12   No facility-administered medications prior to visit.     Allergies  Allergen Reactions  . Other     Nuts     Review of Systems  Constitutional: Positive for chills. Negative for fever and malaise/fatigue.  HENT: Positive for postnasal drip, rhinorrhea and sore throat. Negative for congestion and ear pain.   Eyes: Negative for blurred vision.  Respiratory: Positive for cough. Negative for shortness of breath and wheezing.   Cardiovascular: Negative for chest pain, palpitations and leg swelling.  Gastrointestinal: Negative for vomiting.  Musculoskeletal: Negative for back pain.  Skin: Negative for  rash.  Neurological: Negative for loss of consciousness and headaches.       Objective:    Physical Exam  Constitutional: She is oriented to person, place, and time. She appears well-developed and well-nourished.  HENT:  Right Ear: External ear normal.  Left Ear: External ear normal.  Nose: Right sinus exhibits maxillary sinus tenderness and frontal sinus tenderness. Left sinus exhibits maxillary sinus tenderness and frontal sinus tenderness.  Mouth/Throat: Posterior oropharyngeal erythema present.  + PND + errythema  Eyes: Conjunctivae are normal. Right eye exhibits no discharge. Left eye exhibits no discharge.  Cardiovascular: Normal rate, regular rhythm and normal heart sounds.  No murmur heard. Pulmonary/Chest: Effort normal and breath sounds normal. No respiratory distress. She has no wheezes. She has no rales. She exhibits no tenderness.  Musculoskeletal: She exhibits no edema.  Lymphadenopathy:    She has cervical adenopathy.  Neurological: She is alert and oriented to person, place, and time.  Nursing note and vitals reviewed.   BP 136/88 (BP Location: Right Arm, Cuff Size: Large)   Pulse 90   Temp 98.1 F (36.7 C) (Oral)   Resp 16   Ht 5\' 5"  (1.651 m)   Wt 216 lb 12.8 oz (98.3 kg)   LMP 09/19/2015   SpO2 94%   BMI 36.08 kg/m  Wt Readings from Last 3 Encounters:  05/19/17 216 lb 12.8 oz (98.3 kg)  04/18/17 220 lb (99.8 kg)  03/21/17 221 lb 12.8 oz (100.6 kg)   BP Readings from Last 3 Encounters:  05/19/17 136/88  04/18/17 (!) 147/102  03/21/17 (!) 156/110     Immunization History  Administered Date(s) Administered  . Td 05/27/2012    Health Maintenance  Topic Date Due  . MAMMOGRAM  05/21/2016  . PAP SMEAR  04/29/2017  . INFLUENZA VACCINE  11/22/2027 (Originally 10/27/2016)  . Fecal DNA (Cologuard)  04/12/2020  . TETANUS/TDAP  06/08/2022  . Hepatitis C Screening  Completed  . HIV Screening  Completed    Lab Results  Component Value Date   WBC 5.5  03/21/2017   HGB 14.0 03/21/2017   HCT 43.3 03/21/2017   PLT 198.0 03/21/2017   GLUCOSE 89 04/18/2017   CHOL 153 03/21/2017   TRIG 88.0 03/21/2017   HDL 60.30 03/21/2017   LDLCALC 75 03/21/2017   ALT 23 04/18/2017   AST 19 04/18/2017   NA 139 04/18/2017   K 3.4 (L) 04/18/2017   CL 102 04/18/2017   CREATININE 0.85 04/18/2017   BUN 17 04/18/2017   CO2 32 04/18/2017   TSH 2.12 03/21/2017   MICROALBUR 1.1 07/03/2012    Lab Results  Component Value Date   TSH 2.12 03/21/2017   Lab Results  Component Value Date   WBC 5.5 03/21/2017  HGB 14.0 03/21/2017   HCT 43.3 03/21/2017   MCV 87.3 03/21/2017   PLT 198.0 03/21/2017   Lab Results  Component Value Date   NA 139 04/18/2017   K 3.4 (L) 04/18/2017   CO2 32 04/18/2017   GLUCOSE 89 04/18/2017   BUN 17 04/18/2017   CREATININE 0.85 04/18/2017   BILITOT 0.5 04/18/2017   ALKPHOS 120 (H) 04/18/2017   AST 19 04/18/2017   ALT 23 04/18/2017   PROT 6.9 04/18/2017   ALBUMIN 3.8 04/18/2017   CALCIUM 8.9 04/18/2017   GFR 89.60 04/18/2017   Lab Results  Component Value Date   CHOL 153 03/21/2017   Lab Results  Component Value Date   HDL 60.30 03/21/2017   Lab Results  Component Value Date   LDLCALC 75 03/21/2017   Lab Results  Component Value Date   TRIG 88.0 03/21/2017   Lab Results  Component Value Date   CHOLHDL 3 03/21/2017   No results found for: HGBA1C       Assessment & Plan:   Problem List Items Addressed This Visit    None    Visit Diagnoses    Pansinusitis, unspecified chronicity    -  Primary   Relevant Medications   amoxicillin-clavulanate (AUGMENTIN) 875-125 MG tablet   predniSONE (DELTASONE) 10 MG tablet    con't mucinex, flonase and zyrtec Inhalers prn for asthma symptoms  rto prn   I have discontinued Lucile M. Counce's mometasone-formoterol. I am also having her start on amoxicillin-clavulanate and predniSONE. Additionally, I am having her maintain her EPINEPHrine, omeprazole,  albuterol, AEROCHAMBER MV, desonide, beclomethasone, cetirizine, amLODipine, and fluticasone.  Meds ordered this encounter  Medications  . amoxicillin-clavulanate (AUGMENTIN) 875-125 MG tablet    Sig: Take 1 tablet by mouth 2 (two) times daily.    Dispense:  20 tablet    Refill:  0  . predniSONE (DELTASONE) 10 MG tablet    Sig: TAKE 3 TABLETS PO QD FOR 3 DAYS THEN TAKE 2 TABLETS PO QD FOR 3 DAYS THEN TAKE 1 TABLET PO QD FOR 3 DAYS THEN TAKE 1/2 TAB PO QD FOR 3 DAYS    Dispense:  20 tablet    Refill:  0    CMA served as Education administrator during this visit. History, Physical and Plan performed by medical provider. Documentation and orders reviewed and attested to.  Ann Held, DO

## 2017-05-19 NOTE — Addendum Note (Signed)
Addended by: Roma Schanz R on: 05/19/2017 10:49 AM   Modules accepted: Orders

## 2017-05-19 NOTE — Patient Instructions (Signed)

## 2017-05-19 NOTE — Telephone Encounter (Signed)
Copied from [place] [id number]. Topic: Quick Communication - Rx Refill/Question >> May 19, 2017  5:23 PM Neva Seat wrote:  The pt wants the pill that will help her from getting the yeast infection because she was prescribed the antibiotics today.  [facility] [address] [place] [address] Phone: [phone] Fax: [phone]   Agent: Please be advised that RX refills may take up to 3 business days. We ask that you follow-up with your pharmacy.

## 2017-05-23 NOTE — Telephone Encounter (Signed)
Have not been able to talk to patient again, left voice mail again today.

## 2017-05-26 ENCOUNTER — Ambulatory Visit: Payer: 59 | Admitting: Family Medicine

## 2017-07-14 ENCOUNTER — Encounter (INDEPENDENT_AMBULATORY_CARE_PROVIDER_SITE_OTHER): Payer: 59

## 2017-07-21 ENCOUNTER — Ambulatory Visit (INDEPENDENT_AMBULATORY_CARE_PROVIDER_SITE_OTHER): Payer: 59 | Admitting: Family Medicine

## 2017-07-21 ENCOUNTER — Encounter (INDEPENDENT_AMBULATORY_CARE_PROVIDER_SITE_OTHER): Payer: Self-pay | Admitting: Family Medicine

## 2017-07-21 VITALS — BP 140/91 | HR 78 | Temp 97.4°F | Ht 65.0 in | Wt 216.0 lb

## 2017-07-21 DIAGNOSIS — Z9189 Other specified personal risk factors, not elsewhere classified: Secondary | ICD-10-CM

## 2017-07-21 DIAGNOSIS — Z1331 Encounter for screening for depression: Secondary | ICD-10-CM | POA: Diagnosis not present

## 2017-07-21 DIAGNOSIS — Z0289 Encounter for other administrative examinations: Secondary | ICD-10-CM

## 2017-07-21 DIAGNOSIS — Z6835 Body mass index (BMI) 35.0-35.9, adult: Secondary | ICD-10-CM

## 2017-07-21 DIAGNOSIS — I10 Essential (primary) hypertension: Secondary | ICD-10-CM | POA: Diagnosis not present

## 2017-07-21 DIAGNOSIS — R0602 Shortness of breath: Secondary | ICD-10-CM

## 2017-07-21 DIAGNOSIS — R5383 Other fatigue: Secondary | ICD-10-CM | POA: Diagnosis not present

## 2017-07-22 LAB — COMPREHENSIVE METABOLIC PANEL
ALT: 17 IU/L (ref 0–32)
AST: 18 IU/L (ref 0–40)
Albumin/Globulin Ratio: 1.2 (ref 1.2–2.2)
Albumin: 4.2 g/dL (ref 3.5–5.5)
Alkaline Phosphatase: 123 IU/L — ABNORMAL HIGH (ref 39–117)
BILIRUBIN TOTAL: 0.3 mg/dL (ref 0.0–1.2)
BUN/Creatinine Ratio: 11 (ref 9–23)
BUN: 10 mg/dL (ref 6–24)
CALCIUM: 9.3 mg/dL (ref 8.7–10.2)
CHLORIDE: 103 mmol/L (ref 96–106)
CO2: 26 mmol/L (ref 20–29)
Creatinine, Ser: 0.93 mg/dL (ref 0.57–1.00)
GFR calc Af Amer: 81 mL/min/{1.73_m2} (ref >59)
GFR, EST NON AFRICAN AMERICAN: 70 mL/min/{1.73_m2} (ref >59)
GLOBULIN, TOTAL: 3.4 g/dL (ref 1.5–4.5)
Glucose: 91 mg/dL (ref 65–99)
POTASSIUM: 4 mmol/L (ref 3.5–5.2)
SODIUM: 142 mmol/L (ref 134–144)
Total Protein: 7.6 g/dL (ref 6.0–8.5)

## 2017-07-22 LAB — CBC WITH DIFFERENTIAL
BASOS ABS: 0 10*3/uL (ref 0.0–0.2)
Basos: 0 %
EOS (ABSOLUTE): 0.1 10*3/uL (ref 0.0–0.4)
Eos: 2 %
Hematocrit: 42.2 % (ref 34.0–46.6)
Hemoglobin: 14 g/dL (ref 11.1–15.9)
IMMATURE GRANS (ABS): 0 10*3/uL (ref 0.0–0.1)
IMMATURE GRANULOCYTES: 0 %
LYMPHS ABS: 1.6 10*3/uL (ref 0.7–3.1)
LYMPHS: 34 %
MCH: 28.2 pg (ref 26.6–33.0)
MCHC: 33.2 g/dL (ref 31.5–35.7)
MCV: 85 fL (ref 79–97)
MONOCYTES: 6 %
Monocytes Absolute: 0.3 10*3/uL (ref 0.1–0.9)
NEUTROS PCT: 58 %
Neutrophils Absolute: 2.7 10*3/uL (ref 1.4–7.0)
RBC: 4.97 x10E6/uL (ref 3.77–5.28)
RDW: 14.8 % (ref 12.3–15.4)
WBC: 4.7 10*3/uL (ref 3.4–10.8)

## 2017-07-22 LAB — VITAMIN B12: Vitamin B-12: 638 pg/mL (ref 232–1245)

## 2017-07-22 LAB — LIPID PANEL WITH LDL/HDL RATIO
Cholesterol, Total: 159 mg/dL (ref 100–199)
HDL: 67 mg/dL (ref >39)
LDL CALC: 80 mg/dL (ref 0–99)
LDL/HDL RATIO: 1.2 ratio (ref 0.0–3.2)
Triglycerides: 59 mg/dL (ref 0–149)
VLDL Cholesterol Cal: 12 mg/dL (ref 5–40)

## 2017-07-22 LAB — TSH: TSH: 1.53 u[IU]/mL (ref 0.450–4.500)

## 2017-07-22 LAB — INSULIN, RANDOM: INSULIN: 15 u[IU]/mL (ref 2.6–24.9)

## 2017-07-22 LAB — VITAMIN D 25 HYDROXY (VIT D DEFICIENCY, FRACTURES): Vit D, 25-Hydroxy: 33.3 ng/mL (ref 30.0–100.0)

## 2017-07-22 LAB — T4, FREE: Free T4: 1.32 ng/dL (ref 0.82–1.77)

## 2017-07-22 LAB — HEMOGLOBIN A1C
ESTIMATED AVERAGE GLUCOSE: 117 mg/dL
HEMOGLOBIN A1C: 5.7 % — AB (ref 4.8–5.6)

## 2017-07-22 LAB — FOLATE: Folate: 14.4 ng/mL (ref >3.0)

## 2017-07-22 LAB — T3: T3, Total: 101 ng/dL (ref 71–180)

## 2017-07-25 ENCOUNTER — Other Ambulatory Visit: Payer: Self-pay | Admitting: Family Medicine

## 2017-07-25 MED ORDER — DESONIDE 0.05 % EX LOTN: TOPICAL_LOTION | Freq: Two times a day (BID) | CUTANEOUS | 0 refills | Status: DC

## 2017-07-25 NOTE — Progress Notes (Signed)
Office: [phone]  /  Fax: [phone]   Dear Dr. Carollee [name],   Thank you for referring Ruth [name] to our clinic. The following note includes my evaluation and treatment recommendations.  HPI:   Chief Complaint: OBESITY    Ruth [name] has been referred by Ruth Held, DO for consultation regarding her obesity and obesity related comorbidities.    Ruth [name] (MR# [id number]) is a 57 y.o. female who presents on 07/25/2017 for obesity evaluation and treatment. Current BMI is Body mass index is 35.94 kg/m.Marland Kitchen Ruth [name] has been struggling with her weight for many years and has been unsuccessful in either losing weight, maintaining weight loss, or reaching her healthy weight goal.     Ruth [name] attended our information session and states she is currently in the action stage of change and ready to dedicate time achieving and maintaining a healthier weight. Ruth [name] is interested in becoming our patient and working on intensive lifestyle modifications including (but not limited to) diet, exercise and weight loss.    Ruth [name] states her family eats meals together she thinks her family will eat healthier with  her she struggles with family and or coworkers weight loss sabotage her desired weight loss is 30 lbs she started gaining weight after third child her heaviest weight ever was 220 lbs. she has significant food cravings issues  she snacks frequently in the evenings she skips meals frequently she is frequently drinking liquids with calories she frequently makes poor food choices she has problems with excessive hunger  she frequently eats larger portions than normal  she has binge eating behaviors she struggles with emotional eating    Fatigue Ruth [name] feels her energy is lower than it should be. This has worsened with weight gain and has not worsened recently. Ruth [name] admits to daytime somnolence and admits to waking up still tired. Patient is at risk for obstructive sleep  apnea. Patent has a history of symptoms of daytime fatigue, morning fatigue and hypertension. Patient generally gets 6 hours of sleep per night, and states they generally have restless sleep. Snoring is present. Apneic episodes are present. Epworth Sleepiness Score is 5  Dyspnea on exertion Ruth [name] notes increasing shortness of breath with exercising and seems to be worsening over time with weight gain. She notes getting out of breath sooner with activity than she used to. This has not gotten worse recently. Ruth [name] denies orthopnea.  Hypertension Ruth [name] is a 57 y.o. female with hypertension. She is on Amlodipine and her blood pressure is elevated today at 140/91. Ruth [name] has a history of brain aneurysm and she needs to keep her blood pressure under tight control. Ruth [name] denies chest pain. She is working weight loss to help control her blood pressure with the goal of decreasing her risk of heart attack and stroke. Ruth [name] blood pressure is not currently controlled.  At risk for cardiovascular disease Ruth [name] is at a higher than average risk for cardiovascular disease due to obesity and hypertension. She currently denies any chest pain.  Depression Screen Ruth [name] Food and Mood (modified PHQ-9) score was  Depression screen PHQ 2/9 07/21/2017  Decreased Interest 0  Down, Depressed, Hopeless 1  PHQ - 2 Score 1  Altered sleeping 1  Tired, decreased energy 0  Change in appetite 3  Feeling bad or failure about yourself  1  Trouble concentrating 0  Moving slowly or fidgety/restless 0  Suicidal thoughts 0  PHQ-9 Score 6    ALLERGIES: Allergies  Allergen Reactions  .  Other     Nuts     MEDICATIONS: Current Outpatient Medications on File Prior to Visit  Medication Sig Dispense Refill  . albuterol (PROAIR HFA) 108 (90 Base) MCG/ACT inhaler Inhale 2 puffs into the lungs every 6 (six) hours as needed. 18 g 12  . amLODipine (NORVASC) 10 MG tablet Take 1 tablet (10 mg total) by mouth  daily. 90 tablet 3  . amoxicillin-clavulanate (AUGMENTIN) 875-125 MG tablet Take 1 tablet by mouth 2 (two) times daily. 20 tablet 0  . beclomethasone (QVAR) 80 MCG/ACT inhaler Inhale 2 puffs into the lungs 2 (two) times daily. Rinse mouth after each use. 1 Inhaler 12  . cetirizine (ZYRTEC) 10 MG chewable tablet Chew 1 tablet (10 mg total) by mouth daily. As needed. 90 tablet 1  . desonide (DESOWEN) 0.05 % lotion APPLY TO AFFECTED AREA TWICE A DAY 118 mL 0  . EPINEPHrine (EPI-PEN) 0.3 mg/0.3 mL SOAJ injection Inject into upper thigh for severe allergic reaction 1 Device prn  . fluticasone (FLONASE) 50 MCG/ACT nasal spray 2 SPRAYS EACH NOSTRIL EVERY DAY 16 g 2  . omeprazole (PRILOSEC) 40 MG capsule Take 1 capsule (40 mg total) by mouth daily. 30 capsule 3  . predniSONE (DELTASONE) 10 MG tablet TAKE 3 TABLETS PO QD FOR 3 DAYS THEN TAKE 2 TABLETS PO QD FOR 3 DAYS THEN TAKE 1 TABLET PO QD FOR 3 DAYS THEN TAKE 1/2 TAB PO QD FOR 3 DAYS 20 tablet 0  . Spacer/Aero-Holding Chambers (AEROCHAMBER MV) inhaler Use as instructed 1 each 0  . valACYclovir (VALTREX) 1000 MG tablet Take 1 tablet (1,000 mg total) by mouth 3 (three) times daily. 30 tablet 3   No current facility-administered medications on file prior to visit.     PAST MEDICAL HISTORY: Past Medical History:  Diagnosis Date  . Allergic rhinitis, cause unspecified   . Asthma   . Eczema   . Food allergy   . Gallbladder problem   . Headache   . Hypertension     PAST SURGICAL HISTORY: Past Surgical History:  Procedure Laterality Date  . CESAREAN SECTION    . CESAREAN SECTION Bilateral [id number]    SOCIAL HISTORY: Social History   Tobacco Use  . Smoking status: Never Smoker  . Smokeless tobacco: Never Used  Substance Use Topics  . Alcohol use: Yes    Alcohol/week: 0.0 oz    Comment: one glass of wine per week/fim  . Drug use: No    FAMILY HISTORY: Family History  Problem Relation Age of Onset  . Asthma Mother   . Allergies  Mother   . Asthma Father   . Drug abuse Father   . Allergies Father     ROS: Review of Systems  Constitutional: Positive for malaise/fatigue.  HENT: Positive for congestion (neasal stuffiness), sinus pain and tinnitus.        Hay Fever  Eyes: Positive for blurred vision.       Floaters  Respiratory: Positive for shortness of breath (on exertion).   Cardiovascular: Negative for chest pain and orthopnea.  Gastrointestinal: Positive for heartburn and nausea.  Musculoskeletal:       Muscle Stiffness Muscle or Joint Pain  Skin:       Dryness Hair or Nail Changes  Neurological: Positive for dizziness, weakness and headaches.  Psychiatric/Behavioral: The patient is nervous/anxious (nervousness) and has insomnia.     PHYSICAL EXAM: Blood pressure (!) 140/91, pulse 78, temperature (!) 97.4 F (36.3 C), height 5\' 5"  (1.651  m), weight 216 lb (98 kg), last menstrual period 09/19/2015, SpO2 100 %. Body mass index is 35.94 kg/m. Physical Exam  Constitutional: She is oriented to person, place, and time. She appears well-developed and well-nourished.  HENT:  Head: Normocephalic and atraumatic.  Nose: Nose normal.  Eyes: EOM are normal. No scleral icterus.  Neck: Normal range of motion. Neck supple. No thyromegaly present.  Cardiovascular: Normal rate and regular rhythm.  Pulmonary/Chest: Effort normal. No respiratory distress.  Abdominal: Soft. There is no tenderness.  + obesity  Musculoskeletal: Normal range of motion.  Range of Motion normal in all 4 extremities  Neurological: She is alert and oriented to person, place, and time. Coordination normal.  Skin: Skin is warm and dry.  + acanthosis  Psychiatric: She has a normal mood and affect. Her behavior is normal.  Vitals reviewed.   RECENT LABS AND TESTS: BMET    Component Value Date/Time   NA 142 07/21/2017 0941   K 4.0 07/21/2017 0941   CL 103 07/21/2017 0941   CO2 26 07/21/2017 0941   GLUCOSE 91 07/21/2017 0941    GLUCOSE 89 04/18/2017 0936   BUN 10 07/21/2017 0941   CREATININE 0.93 07/21/2017 0941   CREATININE 0.82 08/24/2013 1633   CALCIUM 9.3 07/21/2017 0941   GFRNONAA 70 07/21/2017 0941   GFRAA 81 07/21/2017 0941   Lab Results  Component Value Date   HGBA1C 5.7 (H) 07/21/2017   Lab Results  Component Value Date   INSULIN 15.0 07/21/2017   CBC    Component Value Date/Time   WBC 4.7 07/21/2017 0941   WBC 5.5 03/21/2017 0934   RBC 4.97 07/21/2017 0941   RBC 4.96 03/21/2017 0934   HGB 14.0 07/21/2017 0941   HCT 42.2 07/21/2017 0941   PLT 198.0 03/21/2017 0934   MCV 85 07/21/2017 0941   MCH 28.2 07/21/2017 0941   MCH 28.4 08/24/2013 1633   MCHC 33.2 07/21/2017 0941   MCHC 32.4 03/21/2017 0934   RDW 14.8 07/21/2017 0941   LYMPHSABS 1.6 07/21/2017 0941   MONOABS 0.3 03/21/2017 0934   EOSABS 0.1 07/21/2017 0941   BASOSABS 0.0 07/21/2017 0941   Iron/TIBC/Ferritin/ %Sat No results found for: IRON, TIBC, FERRITIN, IRONPCTSAT Lipid Panel     Component Value Date/Time   CHOL 159 07/21/2017 0941   TRIG 59 07/21/2017 0941   HDL 67 07/21/2017 0941   CHOLHDL 3 03/21/2017 0934   VLDL 17.6 03/21/2017 0934   LDLCALC 80 07/21/2017 0941   Hepatic Function Panel     Component Value Date/Time   PROT 7.6 07/21/2017 0941   ALBUMIN 4.2 07/21/2017 0941   AST 18 07/21/2017 0941   ALT 17 07/21/2017 0941   ALKPHOS 123 (H) 07/21/2017 0941   BILITOT 0.3 07/21/2017 0941   BILIDIR 0.1 10/10/2014 1133   IBILI 0.3 08/24/2013 1633      Component Value Date/Time   TSH 1.530 07/21/2017 0941   TSH 2.12 03/21/2017 0934   TSH 1.54 10/30/2015 1144    ECG  shows NSR with a rate of 76 BPM INDIRECT CALORIMETER done today shows a VO2 of 241 and a REE of 1678.  Her calculated [place] metabolic [place] [address] thus her basal metabolic rate is worse than expected.    ASSESSMENT AND PLAN: Other fatigue - Plan: EKG 12-Lead, Hemoglobin A1c, Insulin, random, Lipid Panel With LDL/HDL Ratio, T3, T4, free,  TSH, VITAMIN D 25 Hydroxy (Vit-D Deficiency, Fractures), Vitamin B12, Folate  Shortness of breath on exertion  Essential hypertension - Plan: Comprehensive metabolic panel, CBC With Differential  Depression screening  At risk for heart disease  Class 2 severe obesity with serious comorbidity and body mass index (BMI) of 35.0 to 35.9 in adult, unspecified obesity type ([place])  PLAN: Fatigue Ruth [name] was informed that her fatigue may be related to obesity, depression or many other causes. Labs will be ordered, and in the meanwhile Dierra has agreed to work on diet, exercise and weight loss to help with fatigue. Proper sleep hygiene was discussed including the need for 7-8 hours of quality sleep each night. A sleep study was not ordered based on symptoms and Epworth score.  Dyspnea on exertion Ruth [name]'s shortness of breath appears to be obesity related and exercise induced. She has agreed to work on weight loss and gradually increase exercise to treat her exercise induced shortness of breath. If Ruth [name] follows our instructions and loses weight without improvement of her shortness of breath, we will plan to refer to pulmonology. We will monitor this condition regularly. Ruth [name] agrees to this plan.  Hypertension We discussed sodium restriction, working on healthy weight loss, and a regular exercise program as the means to achieve improved blood pressure control. Ruth [name] agreed with this plan and agreed to follow up as directed. We will check labs and will continue to monitor her blood pressure as well as her progress with the above lifestyle modifications. She will continue her medications as prescribed and will watch for signs of hypotension as she continues her lifestyle modifications.  Cardiovascular risk counseling Ruth [name] was given extended (15 minutes) coronary artery disease prevention counseling today. She is 57 y.o. female and has risk factors for heart disease including obesity and hypertension. We  discussed intensive lifestyle modifications today with an emphasis on specific weight loss instructions and strategies. Pt was also informed of the importance of increasing exercise and decreasing saturated fats to help prevent heart disease.  Depression Screen Ruth [name] had a mildly positive depression screening. Depression is commonly associated with obesity and often results in emotional eating behaviors. We will monitor this closely and work on CBT to help improve the non-hunger eating patterns. Referral to Psychology may be required if no improvement is seen as she continues in our clinic.  Obesity Ruth [name] is currently in the action stage of change and her goal is to continue with weight loss efforts. I recommend Ruth [name] begin the structured treatment plan as follows:  She has agreed to follow the Category 2 plan Ruth [name] has been instructed to eventually work up to a goal of 150 minutes of combined cardio and strengthening exercise per week for weight loss and overall health benefits. We discussed the following Behavioral Modification Strategies today: increasing lean protein intake, decreasing simple carbohydrates  and work on meal planning and easy cooking plans   She was informed of the importance of frequent follow up visits to maximize her success with intensive lifestyle modifications for her multiple health conditions. She was informed we would discuss her lab results at her next visit unless there is a critical issue that needs to be addressed sooner. Avital agreed to keep her next visit at the agreed upon time to discuss these results.    OBESITY BEHAVIORAL INTERVENTION VISIT  Today's visit was # 1 out of 22.  Starting weight: 216 lbs Starting date: 07/21/17 Today's weight : 216 lbs  Today's date: 07/21/2017 Total lbs lost to date: 0 (Patients must lose 7 lbs in the first 6 months to continue with counseling)  ASK: We discussed the diagnosis of obesity with Kaleia M Couse today and  Ethelene agreed to give [place] permission to discuss obesity behavioral modification therapy today.  ASSESS: Ajeenah has the diagnosis of obesity and her BMI today is 35.94 Ermagene is in the action stage of change   ADVISE: Sadira was educated on the multiple health risks of obesity as well as the benefit of weight loss to improve her health. She was advised of the need for long term treatment and the importance of lifestyle modifications.  AGREE: Multiple dietary modification options and treatment options were discussed and  Karson agreed to the above obesity treatment plan.   I, Doreene Nest, am acting as transcriptionist for  Dennard Nip, MD  I have reviewed the above documentation for accuracy and completeness, and I agree with the above. -Dennard Nip, MD

## 2017-08-04 ENCOUNTER — Ambulatory Visit (INDEPENDENT_AMBULATORY_CARE_PROVIDER_SITE_OTHER): Payer: 59 | Admitting: Family Medicine

## 2017-08-04 VITALS — BP 115/78 | HR 80 | Temp 98.1°F | Ht 65.0 in | Wt 206.0 lb

## 2017-08-04 DIAGNOSIS — E669 Obesity, unspecified: Secondary | ICD-10-CM

## 2017-08-04 DIAGNOSIS — E559 Vitamin D deficiency, unspecified: Secondary | ICD-10-CM

## 2017-08-04 DIAGNOSIS — F3289 Other specified depressive episodes: Secondary | ICD-10-CM | POA: Diagnosis not present

## 2017-08-04 DIAGNOSIS — R7303 Prediabetes: Secondary | ICD-10-CM | POA: Diagnosis not present

## 2017-08-04 DIAGNOSIS — Z9189 Other specified personal risk factors, not elsewhere classified: Secondary | ICD-10-CM | POA: Diagnosis not present

## 2017-08-04 DIAGNOSIS — Z6834 Body mass index (BMI) 34.0-34.9, adult: Secondary | ICD-10-CM | POA: Diagnosis not present

## 2017-08-04 MED ORDER — BUPROPION HCL ER (SR) 150 MG PO TB12: 150.0000 mg | ORAL_TABLET | Freq: Every day | ORAL | 0 refills | Status: DC

## 2017-08-04 MED ORDER — VITAMIN D (ERGOCALCIFEROL) 1.25 MG (50000 UNIT) PO CAPS: 50000.0000 [IU] | ORAL_CAPSULE | ORAL | 0 refills | Status: DC

## 2017-08-08 NOTE — Progress Notes (Signed)
Office: [phone]  /  Fax: [phone]   HPI:   Chief Complaint: OBESITY Ruth [name] is here to discuss her progress with her obesity treatment plan. She is on the Category 2 plan and is following her eating plan approximately 90 % of the time. She states she is walking and exercising on elliptical for 30 minutes 7 times per week. Ruth [name] did well with weight loss on the category 2 plan. She didn't like her lunch options, but she had been avoiding microwave meals. Her weight is 206 lb (93.4 kg) today and has had a weight loss of 10 pounds over a period of 2 weeks since her last visit. She has lost 10 lbs since starting treatment with [place].  Vitamin D deficiency (new) Ruth [name] has a new diagnosis of vitamin D deficiency. She is not currently taking vit D. Haunani admits fatigue and denies nausea, vomiting or muscle weakness.  Pre-Diabetes (new) Ruth [name] has a new diagnosis of pre-diabetes based on her elevated Hgb A1c and was informed this puts her at greater risk of developing diabetes. She admits polyphagia, but this improved with a steady complex carbohydrate, lean protein with vegetable rich diet. She is not taking metformin currently and continues to work on diet and exercise to decrease risk of diabetes. She denies hypoglycemia.  At risk for diabetes Ruth [name] is at higher than average risk for developing diabetes due to her obesity and pre-diabetes. She currently denies polyuria or polydipsia.  Depression with emotional eating behaviors Ruth [name] notes eating for emotional eating and felt a bit deprived with only limited snacks. Ruth [name] struggles with emotional eating and using food for comfort to the extent that it is negatively impacting her health. She often snacks when she is not hungry. Ruth [name] sometimes feels she is out of control and then feels guilty that she made poor food choices. She has been working on behavior modification techniques to help reduce her emotional eating and has been somewhat  successful. She shows no sign of suicidal or homicidal ideations.  Depression screen [facility] 2/9 07/21/2017 03/21/2017 08/14/2012  Decreased Interest 0 0 0  Down, Depressed, Hopeless 1 0 0  PHQ - 2 Score 1 0 0  Altered sleeping 1 - -  Tired, decreased energy 0 - -  Change in appetite 3 - -  Feeling bad or failure about yourself  1 - -  Trouble concentrating 0 - -  Moving slowly or fidgety/restless 0 - -  Suicidal thoughts 0 - -  PHQ-9 Score 6 - -     ALLERGIES: Allergies  Allergen Reactions  . Other     Nuts     MEDICATIONS: Current Outpatient Medications on File Prior to Visit  Medication Sig Dispense Refill  . albuterol (PROAIR HFA) 108 (90 Base) MCG/ACT inhaler Inhale 2 puffs into the lungs every 6 (six) hours as needed. 18 g 12  . amLODipine (NORVASC) 10 MG tablet Take 1 tablet (10 mg total) by mouth daily. 90 tablet 3  . amoxicillin-clavulanate (AUGMENTIN) 875-125 MG tablet Take 1 tablet by mouth 2 (two) times daily. 20 tablet 0  . beclomethasone (QVAR) 80 MCG/ACT inhaler Inhale 2 puffs into the lungs 2 (two) times daily. Rinse mouth after each use. 1 Inhaler 12  . cetirizine (ZYRTEC) 10 MG chewable tablet Chew 1 tablet (10 mg total) by mouth daily. As needed. 90 tablet 1  . desonide (DESOWEN) 0.05 % lotion Apply topically 2 (two) times daily. 118 mL 0  . EPINEPHrine (EPI-PEN) 0.3 mg/0.3 mL SOAJ  injection Inject into upper thigh for severe allergic reaction 1 Device prn  . fluticasone (FLONASE) 50 MCG/ACT nasal spray 2 SPRAYS EACH NOSTRIL EVERY DAY 16 g 2  . omeprazole (PRILOSEC) 40 MG capsule Take 1 capsule (40 mg total) by mouth daily. 30 capsule 3  . predniSONE (DELTASONE) 10 MG tablet TAKE 3 TABLETS PO QD FOR 3 DAYS THEN TAKE 2 TABLETS PO QD FOR 3 DAYS THEN TAKE 1 TABLET PO QD FOR 3 DAYS THEN TAKE 1/2 TAB PO QD FOR 3 DAYS 20 tablet 0  . Spacer/Aero-Holding Chambers (AEROCHAMBER MV) inhaler Use as instructed 1 each 0  . valACYclovir (VALTREX) 1000 MG tablet Take 1 tablet  (1,000 mg total) by mouth 3 (three) times daily. 30 tablet 3   No current facility-administered medications on file prior to visit.     PAST MEDICAL HISTORY: Past Medical History:  Diagnosis Date  . Allergic rhinitis, cause unspecified   . Asthma   . Eczema   . Food allergy   . Gallbladder problem   . Headache   . Hypertension     PAST SURGICAL HISTORY: Past Surgical History:  Procedure Laterality Date  . CESAREAN SECTION    . CESAREAN SECTION Bilateral [id number]    SOCIAL HISTORY: Social History   Tobacco Use  . Smoking status: Never Smoker  . Smokeless tobacco: Never Used  Substance Use Topics  . Alcohol use: Yes    Alcohol/week: 0.0 oz    Comment: one glass of wine per week/fim  . Drug use: No    FAMILY HISTORY: Family History  Problem Relation Age of Onset  . Asthma Mother   . Allergies Mother   . Asthma Father   . Drug abuse Father   . Allergies Father     ROS: Review of Systems  Constitutional: Positive for malaise/fatigue and weight loss.  Gastrointestinal: Negative for nausea and vomiting.  Genitourinary: Negative for frequency.  Musculoskeletal:       Negative for muscle weakness  Endo/Heme/Allergies: Negative for polydipsia.       Negative for hypgolycemia Positive for polyphagia  Psychiatric/Behavioral: Positive for depression. Negative for suicidal ideas.    PHYSICAL EXAM: Blood pressure 115/78, pulse 80, temperature 98.1 F (36.7 C), temperature source Oral, height 5\' 5"  (1.651 m), weight 206 lb (93.4 kg), last menstrual period 09/19/2015, SpO2 99 %. Body mass index is 34.28 kg/m. Physical Exam  Constitutional: She is oriented to person, place, and time. She appears well-developed and well-nourished.  Cardiovascular: Normal rate.  Pulmonary/Chest: Effort normal.  Musculoskeletal: Normal range of motion.  Neurological: She is oriented to person, place, and time.  Skin: Skin is warm and dry.  Psychiatric: She has a normal mood and  affect. Her behavior is normal.  Vitals reviewed.   RECENT LABS AND TESTS: BMET    Component Value Date/Time   NA 142 07/21/2017 0941   K 4.0 07/21/2017 0941   CL 103 07/21/2017 0941   CO2 26 07/21/2017 0941   GLUCOSE 91 07/21/2017 0941   GLUCOSE 89 04/18/2017 0936   BUN 10 07/21/2017 0941   CREATININE 0.93 07/21/2017 0941   CREATININE 0.82 08/24/2013 1633   CALCIUM 9.3 07/21/2017 0941   GFRNONAA 70 07/21/2017 0941   GFRAA 81 07/21/2017 0941   Lab Results  Component Value Date   HGBA1C 5.7 (H) 07/21/2017   Lab Results  Component Value Date   INSULIN 15.0 07/21/2017   CBC    Component Value Date/Time   WBC 4.7 07/21/2017  0941   WBC 5.5 03/21/2017 0934   RBC 4.97 07/21/2017 0941   RBC 4.96 03/21/2017 0934   HGB 14.0 07/21/2017 0941   HCT 42.2 07/21/2017 0941   PLT 198.0 03/21/2017 0934   MCV 85 07/21/2017 0941   MCH 28.2 07/21/2017 0941   MCH 28.4 08/24/2013 1633   MCHC 33.2 07/21/2017 0941   MCHC 32.4 03/21/2017 0934   RDW 14.8 07/21/2017 0941   LYMPHSABS 1.6 07/21/2017 0941   MONOABS 0.3 03/21/2017 0934   EOSABS 0.1 07/21/2017 0941   BASOSABS 0.0 07/21/2017 0941   Iron/TIBC/Ferritin/ %Sat No results found for: IRON, TIBC, FERRITIN, IRONPCTSAT Lipid Panel     Component Value Date/Time   CHOL 159 07/21/2017 0941   TRIG 59 07/21/2017 0941   HDL 67 07/21/2017 0941   CHOLHDL 3 03/21/2017 0934   VLDL 17.6 03/21/2017 0934   LDLCALC 80 07/21/2017 0941   Hepatic Function Panel     Component Value Date/Time   PROT 7.6 07/21/2017 0941   ALBUMIN 4.2 07/21/2017 0941   AST 18 07/21/2017 0941   ALT 17 07/21/2017 0941   ALKPHOS 123 (H) 07/21/2017 0941   BILITOT 0.3 07/21/2017 0941   BILIDIR 0.1 10/10/2014 1133   IBILI 0.3 08/24/2013 1633      Component Value Date/Time   TSH 1.530 07/21/2017 0941   TSH 2.12 03/21/2017 0934   TSH 1.54 10/30/2015 1144   Results for TANISA, LAGACE (MRN [id number]) as of 08/08/2017 09:02  Ref. Range 07/21/2017 09:41    Vitamin D, 25-Hydroxy Latest Ref Range: 30.0 - 100.0 ng/mL 33.3   ASSESSMENT AND PLAN: Vitamin D deficiency - Plan: Vitamin D, Ergocalciferol, (DRISDOL) 50000 units CAPS capsule  Prediabetes  Other depression - with emotional eating - Plan: buPROPion (WELLBUTRIN SR) 150 MG 12 hr tablet  At risk for diabetes mellitus  Class 1 obesity with serious comorbidity and body mass index (BMI) of 34.0 to 34.9 in adult, unspecified obesity type  PLAN:  Vitamin D Deficiency (new) Ruth [name] was informed that low vitamin D levels contributes to fatigue and are associated with obesity, breast, and colon cancer. She agrees to start to take prescription Vit D @50 ,000 IU every week #4 with no refills and will follow up for routine testing of vitamin D, at least 2-3 times per year. She was informed of the risk of over-replacement of vitamin D and agrees to not increase her dose unless she discusses this with [place] first. Ruth [name] agrees to follow up with our clinic in 2 to 3 weeks.  Pre-Diabetes (new) Ruth [name] will continue to work on weight loss, exercise, and decreasing simple carbohydrates in her diet to help decrease the risk of diabetes. She was informed that eating too many simple carbohydrates or too many calories at one sitting increases the likelihood of GI side effects. We will defer metformin and recheck labs in 3 months. Ruth [name] agreed to follow up with [place] as directed to monitor her progress.  Diabetes risk counseling Ruth [name] was given extended (30 minutes) diabetes prevention counseling today. She is 57 y.o. female and has risk factors for diabetes including obesity and pre-diabetes. We discussed intensive lifestyle modifications today with an emphasis on weight loss as well as increasing exercise and decreasing simple carbohydrates in her diet.  Depression with Emotional Eating Behaviors We discussed behavior modification techniques today to help Ruth [name] deal with her emotional eating and depression. She has  agreed start Wellbutrin SR 150 mg qAM #30 with no refills and follow up as  directed.  Obesity  Ruth [name] is currently in the action stage of change. As such, her goal is to continue with weight loss efforts She has agreed to follow the Category 2 plan +100 calories Ruth [name] has been instructed to work up to a goal of 150 minutes of combined cardio and strengthening exercise per week for weight loss and overall health benefits. We discussed the following Behavioral Modification Strategies today: better snacking choices, increasing lean protein intake, decreasing simple carbohydrates  and work on meal planning and easy cooking plans  Ruth [name] has agreed to follow up with our clinic in 2 to 3 weeks. She was informed of the importance of frequent follow up visits to maximize her success with intensive lifestyle modifications for her multiple health conditions.   OBESITY BEHAVIORAL INTERVENTION VISIT  Today's visit was # 2 out of 22.  Starting weight: 216 lbs Starting date: 07/21/17 Today's weight : 206 lbs Today's date: 08/04/2017 Total lbs lost to date: 10 (Patients must lose 7 lbs in the first 6 months to continue with counseling)   ASK: We discussed the diagnosis of obesity with Ruth [name] today and Ruth [name] agreed to give [place] permission to discuss obesity behavioral modification therapy today.  ASSESS: Ruth [name] has the diagnosis of obesity and her BMI today is 34.28 Ruth [name] is in the action stage of change   ADVISE: Ruth [name] was educated on the multiple health risks of obesity as well as the benefit of weight loss to improve her health. She was advised of the need for long term treatment and the importance of lifestyle modifications.  AGREE: Multiple dietary modification options and treatment options were discussed and  Ruth [name] agreed to the above obesity treatment plan.  I, Doreene Nest, am acting as transcriptionist for Dennard Nip, MD  I have reviewed the above documentation for accuracy and  completeness, and I agree with the above. -Dennard Nip, MD

## 2017-08-31 ENCOUNTER — Ambulatory Visit (INDEPENDENT_AMBULATORY_CARE_PROVIDER_SITE_OTHER): Payer: 59 | Admitting: Family Medicine

## 2017-08-31 VITALS — BP 118/79 | HR 76 | Temp 98.1°F | Ht 65.0 in | Wt 205.0 lb

## 2017-08-31 DIAGNOSIS — E669 Obesity, unspecified: Secondary | ICD-10-CM | POA: Diagnosis not present

## 2017-08-31 DIAGNOSIS — F3289 Other specified depressive episodes: Secondary | ICD-10-CM

## 2017-08-31 DIAGNOSIS — Z6834 Body mass index (BMI) 34.0-34.9, adult: Secondary | ICD-10-CM

## 2017-08-31 DIAGNOSIS — Z9189 Other specified personal risk factors, not elsewhere classified: Secondary | ICD-10-CM

## 2017-08-31 DIAGNOSIS — E66811 Obesity, class 1: Secondary | ICD-10-CM

## 2017-08-31 DIAGNOSIS — E559 Vitamin D deficiency, unspecified: Secondary | ICD-10-CM

## 2017-08-31 MED ORDER — VITAMIN D (ERGOCALCIFEROL) 1.25 MG (50000 UNIT) PO CAPS: 50000.0000 [IU] | ORAL_CAPSULE | ORAL | 0 refills | Status: DC

## 2017-08-31 NOTE — Progress Notes (Signed)
Office: [phone]  /  Fax: [phone]   HPI:   Chief Complaint: OBESITY Ruth [name] is here to discuss her progress with her obesity treatment plan. She is on the Category 2 plan +100 calories and is following her eating plan approximately 80 % of the time. She states she is walking and going to the gym and exercising on elliptical for 20 to 30 minutes 4 to 5 times per week. Ruth [name] continues to lose weight, but has had extra temptations with social eating and work stress eating. Her weight is 205 lb (93 kg) today and has had a weight loss of 1 pound over a period of 4 weeks since her last visit. She has lost 11 lbs since starting treatment with [place].  Vitamin D deficiency Ruth [name] has a diagnosis of vitamin D deficiency. Ruth [name] is stable on vit D, but she is not yet at goal. She denies nausea, vomiting or muscle weakness.  Depression with emotional eating behaviors Ruth [name] was prescribed wellbutrin, but she decided not to take it after "researching". Ruth [name] struggles with emotional eating and using food for comfort to the extent that it is negatively impacting her health. She often snacks when she is not hungry. Ruth [name] sometimes feels she is out of control and then feels guilty that she made poor food choices. She has been working on behavior modification techniques to help reduce her emotional eating and has been somewhat successful. She shows no sign of suicidal or homicidal ideations.  At risk for cardiovascular disease Ruth [name] is at a higher than average risk for cardiovascular disease due to obesity. She currently denies any chest pain.  Depression screen [facility] 2/9 07/21/2017 03/21/2017 08/14/2012  Decreased Interest 0 0 0  Down, Depressed, Hopeless 1 0 0  PHQ - 2 Score 1 0 0  Altered sleeping 1 - -  Tired, decreased energy 0 - -  Change in appetite 3 - -  Feeling bad or failure about yourself  1 - -  Trouble concentrating 0 - -  Moving slowly or fidgety/restless 0 - -  Suicidal thoughts 0 - -    PHQ-9 Score 6 - -     ALLERGIES: Allergies  Allergen Reactions  . Other     Nuts     MEDICATIONS: Current Outpatient Medications on File Prior to Visit  Medication Sig Dispense Refill  . albuterol (PROAIR HFA) 108 (90 Base) MCG/ACT inhaler Inhale 2 puffs into the lungs every 6 (six) hours as needed. 18 g 12  . amLODipine (NORVASC) 5 MG tablet Take 5 mg by mouth daily.    . cetirizine (ZYRTEC) 10 MG chewable tablet Chew 1 tablet (10 mg total) by mouth daily. As needed. 90 tablet 1  . desonide (DESOWEN) 0.05 % lotion Apply topically 2 (two) times daily. 118 mL 0  . EPINEPHrine (EPI-PEN) 0.3 mg/0.3 mL SOAJ injection Inject into upper thigh for severe allergic reaction 1 Device prn  . fluticasone (FLONASE) 50 MCG/ACT nasal spray 2 SPRAYS EACH NOSTRIL EVERY DAY 16 g 2  . omeprazole (PRILOSEC) 40 MG capsule Take 1 capsule (40 mg total) by mouth daily. 30 capsule 3  . valACYclovir (VALTREX) 1000 MG tablet Take 1 tablet (1,000 mg total) by mouth 3 (three) times daily. 30 tablet 3  . Vitamin D, Ergocalciferol, (DRISDOL) 50000 units CAPS capsule Take 1 capsule (50,000 Units total) by mouth every 7 (seven) days. 4 capsule 0   No current facility-administered medications on file prior to visit.     PAST MEDICAL  HISTORY: Past Medical History:  Diagnosis Date  . Allergic rhinitis, cause unspecified   . Asthma   . Eczema   . Food allergy   . Gallbladder problem   . Headache   . Hypertension     PAST SURGICAL HISTORY: Past Surgical History:  Procedure Laterality Date  . CESAREAN SECTION    . CESAREAN SECTION Bilateral [id number]    SOCIAL HISTORY: Social History   Tobacco Use  . Smoking status: Never Smoker  . Smokeless tobacco: Never Used  Substance Use Topics  . Alcohol use: Yes    Alcohol/week: 0.0 oz    Comment: one glass of wine per week/fim  . Drug use: No    FAMILY HISTORY: Family History  Problem Relation Age of Onset  . Asthma Mother   . Allergies Mother   .  Asthma Father   . Drug abuse Father   . Allergies Father     ROS: Review of Systems  Constitutional: Positive for weight loss.  Cardiovascular: Negative for chest pain.  Gastrointestinal: Negative for nausea and vomiting.  Musculoskeletal:       Negative for muscle weakness  Psychiatric/Behavioral: Positive for depression. Negative for suicidal ideas.    PHYSICAL EXAM: Blood pressure 118/79, pulse 76, temperature 98.1 F (36.7 C), temperature source Oral, height 5\' 5"  (1.651 m), weight 205 lb (93 kg), last menstrual period 09/19/2015, SpO2 100 %. Body mass index is 34.11 kg/m. Physical Exam  Constitutional: She is oriented to person, place, and time. She appears well-developed and well-nourished.  Cardiovascular: Normal rate.  Pulmonary/Chest: Effort normal.  Musculoskeletal: Normal range of motion.  Neurological: She is oriented to person, place, and time.  Skin: Skin is warm and dry.  Psychiatric: She has a normal mood and affect. Her behavior is normal.  Vitals reviewed.   RECENT LABS AND TESTS: BMET    Component Value Date/Time   NA 142 07/21/2017 0941   K 4.0 07/21/2017 0941   CL 103 07/21/2017 0941   CO2 26 07/21/2017 0941   GLUCOSE 91 07/21/2017 0941   GLUCOSE 89 04/18/2017 0936   BUN 10 07/21/2017 0941   CREATININE 0.93 07/21/2017 0941   CREATININE 0.82 08/24/2013 1633   CALCIUM 9.3 07/21/2017 0941   GFRNONAA 70 07/21/2017 0941   GFRAA 81 07/21/2017 0941   Lab Results  Component Value Date   HGBA1C 5.7 (H) 07/21/2017   Lab Results  Component Value Date   INSULIN 15.0 07/21/2017   CBC    Component Value Date/Time   WBC 4.7 07/21/2017 0941   WBC 5.5 03/21/2017 0934   RBC 4.97 07/21/2017 0941   RBC 4.96 03/21/2017 0934   HGB 14.0 07/21/2017 0941   HCT 42.2 07/21/2017 0941   PLT 198.0 03/21/2017 0934   MCV 85 07/21/2017 0941   MCH 28.2 07/21/2017 0941   MCH 28.4 08/24/2013 1633   MCHC 33.2 07/21/2017 0941   MCHC 32.4 03/21/2017 0934   RDW  14.8 07/21/2017 0941   LYMPHSABS 1.6 07/21/2017 0941   MONOABS 0.3 03/21/2017 0934   EOSABS 0.1 07/21/2017 0941   BASOSABS 0.0 07/21/2017 0941   Iron/TIBC/Ferritin/ %Sat No results found for: IRON, TIBC, FERRITIN, IRONPCTSAT Lipid Panel     Component Value Date/Time   CHOL 159 07/21/2017 0941   TRIG 59 07/21/2017 0941   HDL 67 07/21/2017 0941   CHOLHDL 3 03/21/2017 0934   VLDL 17.6 03/21/2017 0934   LDLCALC 80 07/21/2017 0941   Hepatic Function Panel  Component Value Date/Time   PROT 7.6 07/21/2017 0941   ALBUMIN 4.2 07/21/2017 0941   AST 18 07/21/2017 0941   ALT 17 07/21/2017 0941   ALKPHOS 123 (H) 07/21/2017 0941   BILITOT 0.3 07/21/2017 0941   BILIDIR 0.1 10/10/2014 1133   IBILI 0.3 08/24/2013 1633      Component Value Date/Time   TSH 1.530 07/21/2017 0941   TSH 2.12 03/21/2017 0934   TSH 1.54 10/30/2015 1144   Results for IYANAH, DEMONT (MRN [id number]) as of 08/31/2017 08:03  Ref. Range 07/21/2017 09:41  Vitamin D, 25-Hydroxy Latest Ref Range: 30.0 - 100.0 ng/mL 33.3   ASSESSMENT AND PLAN: Vitamin D deficiency - Plan: Vitamin D, Ergocalciferol, (DRISDOL) 50000 units CAPS capsule  Other depression - with emotional eating  At risk for heart disease  Class 1 obesity with serious comorbidity and body mass index (BMI) of 34.0 to 34.9 in adult, unspecified obesity type  PLAN:  Vitamin D Deficiency Ruth [name] was informed that low vitamin D levels contributes to fatigue and are associated with obesity, breast, and colon cancer. She agrees to continue to take prescription Vit D @50 ,000 IU every week #4 with no refills and will follow up for routine testing of vitamin D, at least 2-3 times per year. She was informed of the risk of over-replacement of vitamin D and agrees to not increase her dose unless she discusses this with [place] first. Ruth [name] agrees to follow up as directed.  Depression with Emotional Eating Behaviors Ruth [name] was educated on wellbutrin and ways to  decrease emotional eating. Patient will defer wellbutrin for now. We discussed behavior modification techniques today to help Ruth [name] deal with her emotional eating and depression. She has agreed to follow up as directed.  Cardiovascular risk counseling Ruth [name] was given extended (15 minutes) coronary artery disease prevention counseling today. She is 57 y.o. female and has risk factors for heart disease including obesity. We discussed intensive lifestyle modifications today with an emphasis on specific weight loss instructions and strategies. Pt was also informed of the importance of increasing exercise and decreasing saturated fats to help prevent heart disease.  Obesity Ruth [name] is currently in the action stage of change. As such, her goal is to continue with weight loss efforts She has agreed to follow the Category 2 plan Ruth [name] has been instructed to work up to a goal of 150 minutes of combined cardio and strengthening exercise per week for weight loss and overall health benefits. We discussed the following Behavioral Modification Strategies today: increasing lean protein intake, decreasing simple carbohydrates , work on meal planning and easy cooking plans and emotional eating strategies We discussed options to improve variety.  Ruth [name] has agreed to follow up with our clinic in 2 to 3 weeks. She was informed of the importance of frequent follow up visits to maximize her success with intensive lifestyle modifications for her multiple health conditions.   OBESITY BEHAVIORAL INTERVENTION VISIT  Today's visit was # 3 out of 22.  Starting weight: 216 lbs Starting date: 07/21/17 Today's weight : 205 lbs  Today's date: 08/31/2017 Total lbs lost to date: 11 (Patients must lose 7 lbs in the first 6 months to continue with counseling)   ASK: We discussed the diagnosis of obesity with Ruth [name] T[place]en today and Muranda agreed to give [place] permission to discuss obesity behavioral modification therapy  today.  ASSESS: Robie has the diagnosis of obesity and her BMI today is 34.11 Janaiya is in the action stage of change  ADVISE: Joane was educated on the multiple health risks of obesity as well as the benefit of weight loss to improve her health. She was advised of the need for long term treatment and the importance of lifestyle modifications.  AGREE: Multiple dietary modification options and treatment options were discussed and  Yasmyn agreed to the above obesity treatment plan.  I, Doreene Nest, am acting as transcriptionist for Dennard Nip, MD  I have reviewed the above documentation for accuracy and completeness, and I agree with the above. -Dennard Nip, MD

## 2017-09-05 DIAGNOSIS — J019 Acute sinusitis, unspecified: Secondary | ICD-10-CM | POA: Diagnosis not present

## 2017-09-08 ENCOUNTER — Ambulatory Visit: Payer: 59 | Admitting: Family Medicine

## 2017-09-08 ENCOUNTER — Encounter: Payer: Self-pay | Admitting: Family Medicine

## 2017-09-08 VITALS — BP 128/90 | HR 88 | Temp 98.7°F | Resp 16 | Ht 65.0 in | Wt 207.0 lb

## 2017-09-08 DIAGNOSIS — J0110 Acute frontal sinusitis, unspecified: Secondary | ICD-10-CM | POA: Diagnosis not present

## 2017-09-08 NOTE — Progress Notes (Signed)
[facility] [address] [place], [place] [address] [phone]  Date:  09/08/2017   Name:  Ruth [name]   DOB:  31-Oct-1960   MRN:  [id number]  PCP:  Ann Held, DO    Chief Complaint: Sinusitis (prescribed prednisone and Augmentin-improved, still having ear pain right ear, sinus pressure, headache)   History of Present Illness:  Ruth [name] is a 57 y.o. very pleasant female patient who presents with the following:  Pt of Dr. Etter Sjogren here today with illness- I have not seen her in the past  Today is Thursday- This past Monday she had a teledoc visit for illness- she was given prednisone and augmentin for a sinus infection She does feel that she is better, but she still has some pressure and pain in her right sinuses and her right ear.  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 Aneurysm, cerebral, nonruptured 05/06/2015  . White matter abnormality on MRI of brain 05/06/2015  . Migraine headache without aura 05/06/2015  . Vertigo 05/06/2015  . Foreign body of ear, left 09/05/2012  . HTN (hypertension) 06/12/2012  . Allergic rhinitis due to pollen 12/27/2007  . 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 ECZEMA 08/29/2006    Past Medical History:  Diagnosis Date  . Allergic rhinitis, cause unspecified   . Asthma   . Eczema   . Food allergy   . Gallbladder problem   .  Headache   . Hypertension     Past Surgical History:  Procedure Laterality Date  . CESAREAN SECTION    . CESAREAN SECTION Bilateral [id number]    Social History   Tobacco Use  . Smoking status: Never Smoker  . Smokeless tobacco: Never Used  Substance Use Topics  . Alcohol use: Yes    Alcohol/week: 0.0 oz    Comment: one glass of wine per week/fim  . Drug use: No    Family History  Problem Relation Age of Onset  . Asthma Mother   . Allergies Mother   . Asthma Father   . Drug abuse Father   . Allergies Father     Allergies  Allergen Reactions  . Other     Nuts     Medication list has been reviewed and updated.  Current Outpatient Medications on File Prior to Visit  Medication Sig Dispense Refill  . albuterol (PROAIR HFA) 108 (90 Base) MCG/ACT inhaler Inhale 2 puffs into the lungs every 6 (six) hours as needed. 18 g 12  . amLODipine (NORVASC) 5 MG tablet Take 5 mg by mouth daily.    Marland Kitchen amoxicillin-clavulanate (AUGMENTIN) 875-125 MG tablet TK 1 T PO BID FOR 7 DAYS.  0  . cetirizine (ZYRTEC) 10 MG chewable tablet Chew 1 tablet (10 mg total) by mouth daily.  As needed. 90 tablet 1  . desonide (DESOWEN) 0.05 % lotion Apply topically 2 (two) times daily. 118 mL 0  . EPINEPHrine (EPI-PEN) 0.3 mg/0.3 mL SOAJ injection Inject into upper thigh for severe allergic reaction 1 Device prn  . fluticasone (FLONASE) 50 MCG/ACT nasal spray 2 SPRAYS EACH NOSTRIL EVERY DAY 16 g 2  . omeprazole (PRILOSEC) 40 MG capsule Take 1 capsule (40 mg total) by mouth daily. 30 capsule 3  . predniSONE (DELTASONE) 10 MG tablet TK 4 TS PO ONCE IN THE MORNING WF FOR 5 DAYS  0  . valACYclovir (VALTREX) 1000 MG tablet Take 1 tablet (1,000 mg total) by mouth 3 (three) times daily. 30 tablet 3  . Vitamin D, Ergocalciferol, (DRISDOL) 50000 units CAPS capsule Take 1 capsule (50,000 Units total) by mouth every 7 (seven) days. 4 capsule 0   No current facility-administered medications on file prior to visit.      Review of Systems:  As per HPI- otherwise negative. No fever or chills No CP or SOB   Physical Examination: Vitals:   09/08/17 1028  BP: 128/90  Pulse: 88  Resp: 16  Temp: 98.7 F (37.1 C)  SpO2: 96%   Vitals:   09/08/17 1028  Weight: 207 lb (93.9 kg)  Height: 5\' 5"  (1.651 m)   Body mass index is 34.45 kg/m. Ideal Body Weight: Weight in (lb) to have BMI = 25: 149.9  GEN: WDWN, NAD, Non-toxic, A & O x 3, overweight, looks well  HEENT: Atraumatic, Normocephalic. Neck supple. No masses, No LAD.  Bilateral TM wnl, oropharynx normal.  PEERL,EOMI.   Nasal cavity is inflamed left more than right No facial lesions or rash to suggest shingles  Ears and Nose: No external deformity. CV: RRR, No M/G/R. No JVD. No thrill. No extra heart sounds. PULM: CTA B, no wheezes, crackles, rhonchi. No retractions. No resp. distress. No accessory muscle use. EXTR: No c/c/e NEURO Normal gait.  PSYCH: Normally interactive. Conversant. Not depressed or anxious appearing.  Calm demeanor.    Assessment and Plan: Acute non-recurrent frontal sinusitis  Here today for an acute sinusitis which was dx and treated by teledoc earlier this week. She still has some sx but does feel like she is improving Reassured that I think she is on the right track, continue current meds Gave her some mucinex samples to use prn  Signed Lamar Blinks, MD

## 2017-09-08 NOTE — Patient Instructions (Signed)
It was nice to see you today!  I agree with your treatment plan per the telehealth doctor.  However, if you are not getting better over the next several days please let us know- Sooner if worse.  Adding some mucinex may be helpful for you Let me know if you develop any rash or lesions on your skin

## 2017-09-19 ENCOUNTER — Ambulatory Visit (INDEPENDENT_AMBULATORY_CARE_PROVIDER_SITE_OTHER): Payer: 59 | Admitting: Family Medicine

## 2017-09-19 VITALS — BP 122/85 | HR 85 | Temp 97.7°F | Ht 65.0 in | Wt 202.0 lb

## 2017-09-19 DIAGNOSIS — I10 Essential (primary) hypertension: Secondary | ICD-10-CM | POA: Insufficient documentation

## 2017-09-19 DIAGNOSIS — E669 Obesity, unspecified: Secondary | ICD-10-CM

## 2017-09-19 DIAGNOSIS — Z6833 Body mass index (BMI) 33.0-33.9, adult: Secondary | ICD-10-CM

## 2017-09-19 DIAGNOSIS — E559 Vitamin D deficiency, unspecified: Secondary | ICD-10-CM

## 2017-09-19 DIAGNOSIS — Z9189 Other specified personal risk factors, not elsewhere classified: Secondary | ICD-10-CM | POA: Diagnosis not present

## 2017-09-19 MED ORDER — VITAMIN D (ERGOCALCIFEROL) 1.25 MG (50000 UNIT) PO CAPS: 50000.0000 [IU] | ORAL_CAPSULE | ORAL | 0 refills | Status: DC

## 2017-09-19 NOTE — Progress Notes (Signed)
Office: [phone]  /  Fax: [phone]   HPI:   Chief Complaint: OBESITY Ruth [name] is here to discuss her progress with her obesity treatment plan. She is on the Category 2 plan and is following her eating plan approximately 50 % of the time. She states she is doing cardio for 20 to 30 minutes 5 times per week. Ayliana continues to do well with weight loss,even while on prednisone for a sinus infection. Hunger had been controlled, but she is getting bored, especially with her lunch options. Her weight is 202 lb (91.6 kg) today and has had a weight loss of 3 pounds over a period of 2 to 3 weeks since her last visit. She has lost 14 lbs since starting treatment with [place].  Hypertension KIMBLEY SPRAGUE is a 57 y.o. female with hypertension. Her blood pressure was elevated at her PCP's office while she was sick, but is within normal limits today. She is doing well with diet, exercise and weight loss. Pretty Weltman Dowis denies chest pain or shortness of breath on exertion. She is working weight loss to help control her blood pressure with the goal of decreasing her risk of heart attack and stroke. Tonyas blood pressure is currently controlled.  At risk for cardiovascular disease Toma is at a higher than average risk for cardiovascular disease due to obesity and hypertension. She currently denies any chest pain.  Vitamin D deficiency Mliss has a diagnosis of vitamin D deficiency. Launa is stable on vit D, but she is not yet at goal. She denies nausea, vomiting or muscle weakness.  ALLERGIES: Allergies  Allergen Reactions  . Other     Nuts     MEDICATIONS: Current Outpatient Medications on File Prior to Visit  Medication Sig Dispense Refill  . albuterol (PROAIR HFA) 108 (90 Base) MCG/ACT inhaler Inhale 2 puffs into the lungs every 6 (six) hours as needed. 18 g 12  . amLODipine (NORVASC) 5 MG tablet Take 5 mg by mouth daily.    . cetirizine (ZYRTEC) 10 MG chewable tablet Chew 1 tablet (10 mg  total) by mouth daily. As needed. 90 tablet 1  . desonide (DESOWEN) 0.05 % lotion Apply topically 2 (two) times daily. 118 mL 0  . EPINEPHrine (EPI-PEN) 0.3 mg/0.3 mL SOAJ injection Inject into upper thigh for severe allergic reaction 1 Device prn  . fluticasone (FLONASE) 50 MCG/ACT nasal spray 2 SPRAYS EACH NOSTRIL EVERY DAY 16 g 2  . omeprazole (PRILOSEC) 40 MG capsule Take 1 capsule (40 mg total) by mouth daily. 30 capsule 3  . valACYclovir (VALTREX) 1000 MG tablet Take 1 tablet (1,000 mg total) by mouth 3 (three) times daily. 30 tablet 3   No current facility-administered medications on file prior to visit.     PAST MEDICAL HISTORY: Past Medical History:  Diagnosis Date  . Allergic rhinitis, cause unspecified   . Asthma   . Eczema   . Food allergy   . Gallbladder problem   . Headache   . Hypertension     PAST SURGICAL HISTORY: Past Surgical History:  Procedure Laterality Date  . CESAREAN SECTION    . CESAREAN SECTION Bilateral [id number]    SOCIAL HISTORY: Social History   Tobacco Use  . Smoking status: Never Smoker  . Smokeless tobacco: Never Used  Substance Use Topics  . Alcohol use: Yes    Alcohol/week: 0.0 oz    Comment: one glass of wine per week/fim  . Drug use: No    FAMILY  HISTORY: Family History  Problem Relation Age of Onset  . Asthma Mother   . Allergies Mother   . Asthma Father   . Drug abuse Father   . Allergies Father     ROS: Review of Systems  Constitutional: Positive for weight loss.  Respiratory: Negative for shortness of breath (on exertion).   Cardiovascular: Negative for chest pain.  Gastrointestinal: Negative for nausea and vomiting.  Musculoskeletal:       Negative for muscle weakness    PHYSICAL EXAM: Blood pressure 122/85, pulse 85, temperature 97.7 F (36.5 C), temperature source Oral, height 5\' 5"  (1.651 m), weight 202 lb (91.6 kg), last menstrual period 09/19/2015, SpO2 99 %. Body mass index is 33.61 kg/m. Physical Exam    Constitutional: She is oriented to person, place, and time. She appears well-developed and well-nourished.  Cardiovascular: Normal rate.  Pulmonary/Chest: Effort normal.  Musculoskeletal: Normal range of motion.  Neurological: She is oriented to person, place, and time.  Skin: Skin is warm and dry.  Psychiatric: She has a normal mood and affect. Her behavior is normal.  Vitals reviewed.   RECENT LABS AND TESTS: BMET    Component Value Date/Time   NA 142 07/21/2017 0941   K 4.0 07/21/2017 0941   CL 103 07/21/2017 0941   CO2 26 07/21/2017 0941   GLUCOSE 91 07/21/2017 0941   GLUCOSE 89 04/18/2017 0936   BUN 10 07/21/2017 0941   CREATININE 0.93 07/21/2017 0941   CREATININE 0.82 08/24/2013 1633   CALCIUM 9.3 07/21/2017 0941   GFRNONAA 70 07/21/2017 0941   GFRAA 81 07/21/2017 0941   Lab Results  Component Value Date   HGBA1C 5.7 (H) 07/21/2017   Lab Results  Component Value Date   INSULIN 15.0 07/21/2017   CBC    Component Value Date/Time   WBC 4.7 07/21/2017 0941   WBC 5.5 03/21/2017 0934   RBC 4.97 07/21/2017 0941   RBC 4.96 03/21/2017 0934   HGB 14.0 07/21/2017 0941   HCT 42.2 07/21/2017 0941   PLT 198.0 03/21/2017 0934   MCV 85 07/21/2017 0941   MCH 28.2 07/21/2017 0941   MCH 28.4 08/24/2013 1633   MCHC 33.2 07/21/2017 0941   MCHC 32.4 03/21/2017 0934   RDW 14.8 07/21/2017 0941   LYMPHSABS 1.6 07/21/2017 0941   MONOABS 0.3 03/21/2017 0934   EOSABS 0.1 07/21/2017 0941   BASOSABS 0.0 07/21/2017 0941   Iron/TIBC/Ferritin/ %Sat No results found for: IRON, TIBC, FERRITIN, IRONPCTSAT Lipid Panel     Component Value Date/Time   CHOL 159 07/21/2017 0941   TRIG 59 07/21/2017 0941   HDL 67 07/21/2017 0941   CHOLHDL 3 03/21/2017 0934   VLDL 17.6 03/21/2017 0934   LDLCALC 80 07/21/2017 0941   Hepatic Function Panel     Component Value Date/Time   PROT 7.6 07/21/2017 0941   ALBUMIN 4.2 07/21/2017 0941   AST 18 07/21/2017 0941   ALT 17 07/21/2017 0941    ALKPHOS 123 (H) 07/21/2017 0941   BILITOT 0.3 07/21/2017 0941   BILIDIR 0.1 10/10/2014 1133   IBILI 0.3 08/24/2013 1633      Component Value Date/Time   TSH 1.530 07/21/2017 0941   TSH 2.12 03/21/2017 0934   TSH 1.54 10/30/2015 1144   Results for SYMPHONIE, SCHNEIDERMAN (MRN [id number]) as of 09/19/2017 11:23  Ref. Range 07/21/2017 09:41  Vitamin D, 25-Hydroxy Latest Ref Range: 30.0 - 100.0 ng/mL 33.3   ASSESSMENT AND PLAN: Essential hypertension  Vitamin D deficiency -  Plan: Vitamin D, Ergocalciferol, (DRISDOL) 50000 units CAPS capsule  At risk for heart disease  Class 1 obesity with serious comorbidity and body mass index (BMI) of 33.0 to 33.9 in adult, unspecified obesity type  PLAN:  Hypertension We discussed sodium restriction, working on healthy weight loss, and a regular exercise program as the means to achieve improved blood pressure control. Layana agreed with this plan and agreed to follow up as directed. We will continue to monitor her blood pressure as well as her progress with the above lifestyle modifications. She will continue her medications as prescribed and will watch for signs of hypotension as she continues her lifestyle modifications.  Cardiovascular risk counseling Lasharon was given extended (15 minutes) coronary artery disease prevention counseling today. She is 57 y.o. female and has risk factors for heart disease including obesity and hypertension. We discussed intensive lifestyle modifications today with an emphasis on specific weight loss instructions and strategies. Pt was also informed of the importance of increasing exercise and decreasing saturated fats to help prevent heart disease.  Vitamin D Deficiency Kailen was informed that low vitamin D levels contributes to fatigue and are associated with obesity, breast, and colon cancer. She agrees to continue to take prescription Vit D @50 ,000 IU every week #4 with no refills and will follow up for routine testing of  vitamin D, at least 2-3 times per year. She was informed of the risk of over-replacement of vitamin D and agrees to not increase her dose unless she discusses this with [place] first. Aralynn agrees to follow up as directed.  Obesity Helga is currently in the action stage of change. As such, her goal is to continue with weight loss efforts She has agreed to keep a food journal with 300 to 500 calories and 35 grams of protein at lunch and follow the Category 2 plan Marielys has been instructed to work up to a goal of 150 minutes of combined cardio and strengthening exercise per week for weight loss and overall health benefits. We discussed the following Behavioral Modification Strategies today: increasing lean protein intake, decreasing simple carbohydrates  and work on meal planning and easy cooking plans  Sareen has agreed to follow up with our clinic in 3 weeks. She was informed of the importance of frequent follow up visits to maximize her success with intensive lifestyle modifications for her multiple health conditions.   OBESITY BEHAVIORAL INTERVENTION VISIT  Today's visit was # 4 out of 22.  Starting weight: 216 lbs Starting date: 07/21/17 Today's weight : 202 lbs Today's date: 09/19/2017 Total lbs lost to date: 14 (Patients must lose 7 lbs in the first 6 months to continue with counseling)   ASK: We discussed the diagnosis of obesity with Pleas Patricia Susan today and Roseana agreed to give [place] permission to discuss obesity behavioral modification therapy today.  ASSESS: Khloi has the diagnosis of obesity and her BMI today is 33.61 Ranesha is in the action stage of change   ADVISE: Ceilidh was educated on the multiple health risks of obesity as well as the benefit of weight loss to improve her health. She was advised of the need for long term treatment and the importance of lifestyle modifications.  AGREE: Multiple dietary modification options and treatment options were discussed and  Sarrinah agreed  to the above obesity treatment plan.  I, Doreene Nest, am acting as transcriptionist for Dennard Nip, MD  I have reviewed the above documentation for accuracy and completeness, and I agree with the  above. -Dennard Nip, MD

## 2017-09-27 ENCOUNTER — Telehealth: Payer: Self-pay | Admitting: Family

## 2017-09-27 NOTE — Telephone Encounter (Signed)
Pt is requesting refill on Desowen lotion. Please advise.

## 2017-09-28 MED ORDER — DESONIDE 0.05 % EX LOTN: TOPICAL_LOTION | Freq: Two times a day (BID) | CUTANEOUS | 0 refills | Status: DC

## 2017-09-28 MED FILL — DESONIDE 0.05% LOTION: 0.05 | 10 days supply | Qty: 59 | Fill #0

## 2017-10-12 ENCOUNTER — Ambulatory Visit (INDEPENDENT_AMBULATORY_CARE_PROVIDER_SITE_OTHER): Payer: 59 | Admitting: Family Medicine

## 2017-10-12 VITALS — BP 121/82 | HR 69 | Temp 98.2°F | Ht 65.0 in | Wt 203.0 lb

## 2017-10-12 DIAGNOSIS — E669 Obesity, unspecified: Secondary | ICD-10-CM

## 2017-10-12 DIAGNOSIS — Z6833 Body mass index (BMI) 33.0-33.9, adult: Secondary | ICD-10-CM

## 2017-10-12 DIAGNOSIS — I10 Essential (primary) hypertension: Secondary | ICD-10-CM

## 2017-10-12 NOTE — Progress Notes (Signed)
Office: [phone]  /  Fax: [phone]   HPI:   Chief Complaint: OBESITY Ruth [name] is here to discuss her progress with her obesity treatment plan. She is on the keep a food journal with 300-500 calories and 35 grams of protein at lunch daily and follow the Category 2 plan and is following her eating plan approximately 80 % of the time. She states she is walking and going to the gym for 30 minutes 5-6 times per week. [place] was on vacation and increased eating out but was still mindful and walking. She is now ready to get back on track and has enjoyed the freedom of journaling her lunch.  Her weight is 203 lb (92.1 kg) today and has gained 1 pound since her last visit. She has lost 16 lbs since starting treatment with [place].  Hypertension Ruth [name] is a 57 y.o. female with hypertension. Ruth [name]'s blood pressure is stable on medications. She denies chest pain, headache, or lightheadedness on amlodipine. She is working on diet and weight loss to help control her blood pressure with the goal of decreasing her risk of heart attack and stroke. Ruth [name]'s blood pressure is currently controlled.  ALLERGIES: Allergies  Allergen Reactions  . Other     Nuts     MEDICATIONS: Current Outpatient Medications on File Prior to Visit  Medication Sig Dispense Refill  . albuterol (PROAIR HFA) 108 (90 Base) MCG/ACT inhaler Inhale 2 puffs into the lungs every 6 (six) hours as needed. 18 g 12  . amLODipine (NORVASC) 5 MG tablet Take 5 mg by mouth daily.    . cetirizine (ZYRTEC) 10 MG chewable tablet Chew 1 tablet (10 mg total) by mouth daily. As needed. 90 tablet 1  . desonide (DESOWEN) 0.05 % lotion Apply topically 2 (two) times daily. 118 mL 0  . EPINEPHrine (EPI-PEN) 0.3 mg/0.3 mL SOAJ injection Inject into upper thigh for severe allergic reaction 1 Device prn  . fluticasone (FLONASE) 50 MCG/ACT nasal spray 2 SPRAYS EACH NOSTRIL EVERY DAY 16 g 2  . omeprazole (PRILOSEC) 40 MG capsule Take 1 capsule (40  mg total) by mouth daily. 30 capsule 3  . valACYclovir (VALTREX) 1000 MG tablet Take 1 tablet (1,000 mg total) by mouth 3 (three) times daily. 30 tablet 3  . Vitamin D, Ergocalciferol, (DRISDOL) 50000 units CAPS capsule Take 1 capsule (50,000 Units total) by mouth every 7 (seven) days. 4 capsule 0   No current facility-administered medications on file prior to visit.     PAST MEDICAL HISTORY: Past Medical History:  Diagnosis Date  . Allergic rhinitis, cause unspecified   . Asthma   . Eczema   . Food allergy   . Gallbladder problem   . Headache   . Hypertension     PAST SURGICAL HISTORY: Past Surgical History:  Procedure Laterality Date  . CESAREAN SECTION    . CESAREAN SECTION Bilateral [id number]    SOCIAL HISTORY: Social History   Tobacco Use  . Smoking status: Never Smoker  . Smokeless tobacco: Never Used  Substance Use Topics  . Alcohol use: Yes    Alcohol/week: 0.0 oz    Comment: one glass of wine per week/fim  . Drug use: No    FAMILY HISTORY: Family History  Problem Relation Age of Onset  . Asthma Mother   . Allergies Mother   . Asthma Father   . Drug abuse Father   . Allergies Father     ROS: Review of Systems  Constitutional: Negative  for weight loss.  Cardiovascular: Negative for chest pain.  Neurological: Negative for headaches.       Negative lightheadedness    PHYSICAL EXAM: Blood pressure 121/82, pulse 69, temperature 98.2 F (36.8 C), temperature source Oral, height 5\' 5"  (1.651 m), weight 203 lb (92.1 kg), last menstrual period 09/19/2015, SpO2 99 %. Body mass index is 33.78 kg/m. Physical Exam  Constitutional: She is oriented to person, place, and time. She appears well-developed and well-nourished.  Cardiovascular: Normal rate.  Pulmonary/Chest: Effort normal.  Musculoskeletal: Normal range of motion.  Neurological: She is oriented to person, place, and time.  Skin: Skin is warm and dry.  Psychiatric: She has a normal mood and  affect. Her behavior is normal.  Vitals reviewed.   RECENT LABS AND TESTS: BMET    Component Value Date/Time   NA 142 07/21/2017 0941   K 4.0 07/21/2017 0941   CL 103 07/21/2017 0941   CO2 26 07/21/2017 0941   GLUCOSE 91 07/21/2017 0941   GLUCOSE 89 04/18/2017 0936   BUN 10 07/21/2017 0941   CREATININE 0.93 07/21/2017 0941   CREATININE 0.82 08/24/2013 1633   CALCIUM 9.3 07/21/2017 0941   GFRNONAA 70 07/21/2017 0941   GFRAA 81 07/21/2017 0941   Lab Results  Component Value Date   HGBA1C 5.7 (H) 07/21/2017   Lab Results  Component Value Date   INSULIN 15.0 07/21/2017   CBC    Component Value Date/Time   WBC 4.7 07/21/2017 0941   WBC 5.5 03/21/2017 0934   RBC 4.97 07/21/2017 0941   RBC 4.96 03/21/2017 0934   HGB 14.0 07/21/2017 0941   HCT 42.2 07/21/2017 0941   PLT 198.0 03/21/2017 0934   MCV 85 07/21/2017 0941   MCH 28.2 07/21/2017 0941   MCH 28.4 08/24/2013 1633   MCHC 33.2 07/21/2017 0941   MCHC 32.4 03/21/2017 0934   RDW 14.8 07/21/2017 0941   LYMPHSABS 1.6 07/21/2017 0941   MONOABS 0.3 03/21/2017 0934   EOSABS 0.1 07/21/2017 0941   BASOSABS 0.0 07/21/2017 0941   Iron/TIBC/Ferritin/ %Sat No results found for: IRON, TIBC, FERRITIN, IRONPCTSAT Lipid Panel     Component Value Date/Time   CHOL 159 07/21/2017 0941   TRIG 59 07/21/2017 0941   HDL 67 07/21/2017 0941   CHOLHDL 3 03/21/2017 0934   VLDL 17.6 03/21/2017 0934   LDLCALC 80 07/21/2017 0941   Hepatic Function Panel     Component Value Date/Time   PROT 7.6 07/21/2017 0941   ALBUMIN 4.2 07/21/2017 0941   AST 18 07/21/2017 0941   ALT 17 07/21/2017 0941   ALKPHOS 123 (H) 07/21/2017 0941   BILITOT 0.3 07/21/2017 0941   BILIDIR 0.1 10/10/2014 1133   IBILI 0.3 08/24/2013 1633      Component Value Date/Time   TSH 1.530 07/21/2017 0941   TSH 2.12 03/21/2017 0934   TSH 1.54 10/30/2015 1144    ASSESSMENT AND PLAN: Essential hypertension  Class 1 obesity with serious comorbidity and body mass  index (BMI) of 33.0 to 33.9 in adult, unspecified obesity type  PLAN:  Hypertension We discussed sodium restriction, working on healthy weight loss, and a regular exercise program as the means to achieve improved blood pressure control. She will continue diet and exercise, and work on decreasing Na+. Brelee agreed with this plan and agreed to follow up as directed. We will continue to monitor her blood pressure as well as her progress with the above lifestyle modifications. She will continue her medications and will  watch for signs of hypotension as she continues her lifestyle modifications. Carrie agrees to follow up with our clinic in 2 to 3 weeks.  We spent > than 50% of the 15 minute visit on the counseling as documented in the note.  Obesity Farhiya is currently in the action stage of change. As such, her goal is to continue with weight loss efforts She has agreed to change to keep a food journal with 1200-1400 calories and 75+ grams of protein daily Lecia has been instructed to work up to a goal of 150 minutes of combined cardio and strengthening exercise per week for weight loss and overall health benefits. We discussed the following Behavioral Modification Strategies today: decreasing sodium intake, work on meal planning and easy cooking plans, and keep a strict food journal   Jalisia has agreed to follow up with our clinic in 2 to 3 weeks. She was informed of the importance of frequent follow up visits to maximize her success with intensive lifestyle modifications for her multiple health conditions.   OBESITY BEHAVIORAL INTERVENTION VISIT  Today's visit was # 5 out of 22.  Starting weight: 216 lbs Starting date: 07/21/17 Today's weight : 203 lbs  Today's date: 10/12/2017 Total lbs lost to date: 16    ASK: We discussed the diagnosis of obesity with Kenney Houseman M Rotert today and Aslin agreed to give [place] permission to discuss obesity behavioral modification therapy today.  ASSESS: Indria  has the diagnosis of obesity and her BMI today is 33.78 Evolett is in the action stage of change   ADVISE: Jordis was educated on the multiple health risks of obesity as well as the benefit of weight loss to improve her health. She was advised of the need for long term treatment and the importance of lifestyle modifications.  AGREE: Multiple dietary modification options and treatment options were discussed and  Randie agreed to the above obesity treatment plan.  I, Trixie Dredge, am acting as transcriptionist for Dennard Nip, MD  I have reviewed the above documentation for accuracy and completeness, and I agree with the above. -Dennard Nip, MD

## 2017-10-26 ENCOUNTER — Encounter: Payer: Self-pay | Admitting: Family Medicine

## 2017-10-26 ENCOUNTER — Ambulatory Visit: Payer: 59 | Admitting: Podiatry

## 2017-10-26 ENCOUNTER — Encounter: Payer: Self-pay | Admitting: Podiatry

## 2017-10-26 VITALS — BP 145/86 | HR 82 | Wt 203.0 lb

## 2017-10-26 DIAGNOSIS — M216X2 Other acquired deformities of left foot: Secondary | ICD-10-CM

## 2017-10-26 DIAGNOSIS — M21962 Unspecified acquired deformity of left lower leg: Secondary | ICD-10-CM

## 2017-10-26 DIAGNOSIS — M216X1 Other acquired deformities of right foot: Secondary | ICD-10-CM

## 2017-10-26 DIAGNOSIS — M21961 Unspecified acquired deformity of right lower leg: Secondary | ICD-10-CM

## 2017-10-26 DIAGNOSIS — M659 Synovitis and tenosynovitis, unspecified: Secondary | ICD-10-CM | POA: Diagnosis not present

## 2017-10-26 NOTE — Patient Instructions (Addendum)
Seen for pain in left foot at lateral column, 5th Metatarsal base. Noted of tight Achilles tendon with weak first metatarsal bone. Need daily stretch exercise as discussed for the tight Achilles tendon. May benefit from custom orthotics and Equinus brace. Will call for benefit info.  Both feet casted for orthotics.

## 2017-10-26 NOTE — Progress Notes (Signed)
SUBJECTIVE: 57 y.o. year old female presents complaining of pain in left lateral column at about the 5th metatarsal base. Has been treated for plantar fasciitis and was treated with OTC orthotics. On feet at work 12 hours a day, 2-5 days.  Been monitored for brain aneurism x 4 years with MRI and CT scan.  Review of Systems  Constitutional: Negative.   HENT: Negative.   Eyes: Negative.   Respiratory: Negative.   Cardiovascular: Negative.   Gastrointestinal: Negative.   Genitourinary: Negative.   Musculoskeletal: Negative.   Skin: Negative.      OBJECTIVE: DERMATOLOGIC EXAMINATION: All nails are hypertrophic and covered with thick nail polish.  VASCULAR EXAMINATION OF LOWER LIMBS: All pedal pulses are palpable with normal pulsation.  Capillary Filling times within 3 seconds in all digits.  No edema or erythema noted. Temperature gradient from tibial crest to dorsum of foot is within normal bilateral.  NEUROLOGIC EXAMINATION OF THE LOWER LIMBS: All epicritic and tactile sensations grossly intact. Sharp and Dull discriminatory sensations at the plantar ball of hallux is intact bilateral.   MUSCULOSKELETAL EXAMINATION: Positive for hypermobile first ray bilateral. Tight Achilles tendon bilateral. Compensated rearfoot varus bilateral. Pain at left lateral column 5th metatarsal base.   RADIOGRAPHIC STUDIES:  AP View:  Short first metatarsal bone (-5), varus rotated 4th and 5th right and left. Fibular sesamoid position at 4 right and left. Lateral view:  Supinated foot with mild elevation of the first metatarsal bilateral.  ASSESSMENT: Tenosynovitis 5th metatarsal base left. Ankle equinus bilateral. Short elevated first metatarsal bilateral. Compensated rearfoot varus bilateral.  PLAN: Reviewed findings and available treatment options. Both feet casted for Orthotics. Reviewed stretch exercise for tight Achilles tendon. May benefit from Equinus brace.

## 2017-10-28 ENCOUNTER — Telehealth: Payer: Self-pay | Admitting: *Deleted

## 2017-10-28 MED ORDER — ALBUTEROL SULFATE HFA 108 (90 BASE) MCG/ACT IN AERS: 2.0000 | INHALATION_SPRAY | Freq: Four times a day (QID) | RESPIRATORY_TRACT | 12 refills | Status: DC | PRN

## 2017-10-28 NOTE — Telephone Encounter (Signed)
Orthotics are a policy exclusion on this patients plan. Ref # [id number]. Left message for patient to call back

## 2017-11-02 ENCOUNTER — Ambulatory Visit (INDEPENDENT_AMBULATORY_CARE_PROVIDER_SITE_OTHER): Payer: 59 | Admitting: Family Medicine

## 2017-11-02 ENCOUNTER — Other Ambulatory Visit (INDEPENDENT_AMBULATORY_CARE_PROVIDER_SITE_OTHER): Payer: Self-pay | Admitting: Family Medicine

## 2017-11-02 VITALS — BP 108/75 | HR 76 | Temp 98.3°F | Ht 65.0 in | Wt 203.0 lb

## 2017-11-02 DIAGNOSIS — Z9189 Other specified personal risk factors, not elsewhere classified: Secondary | ICD-10-CM

## 2017-11-02 DIAGNOSIS — E669 Obesity, unspecified: Secondary | ICD-10-CM

## 2017-11-02 DIAGNOSIS — E559 Vitamin D deficiency, unspecified: Secondary | ICD-10-CM

## 2017-11-02 DIAGNOSIS — Z6833 Body mass index (BMI) 33.0-33.9, adult: Secondary | ICD-10-CM

## 2017-11-02 DIAGNOSIS — R7303 Prediabetes: Secondary | ICD-10-CM | POA: Diagnosis not present

## 2017-11-02 MED ORDER — VITAMIN D (ERGOCALCIFEROL) 1.25 MG (50000 UNIT) PO CAPS
50000.0000 [IU] | ORAL_CAPSULE | ORAL | 0 refills | Status: DC
Start: 2017-11-02 — End: 2017-11-23

## 2017-11-03 LAB — COMPREHENSIVE METABOLIC PANEL
ALBUMIN: 4.1 g/dL (ref 3.5–5.5)
ALT: 22 IU/L (ref 0–32)
AST: 21 IU/L (ref 0–40)
Albumin/Globulin Ratio: 1.4 (ref 1.2–2.2)
Alkaline Phosphatase: 130 IU/L — ABNORMAL HIGH (ref 39–117)
BUN / CREAT RATIO: 13 (ref 9–23)
BUN: 12 mg/dL (ref 6–24)
Bilirubin Total: 0.4 mg/dL (ref 0.0–1.2)
CO2: 24 mmol/L (ref 20–29)
CREATININE: 0.92 mg/dL (ref 0.57–1.00)
Calcium: 9.3 mg/dL (ref 8.7–10.2)
Chloride: 103 mmol/L (ref 96–106)
GFR calc non Af Amer: 71 mL/min/{1.73_m2} (ref >59)
GFR, EST AFRICAN AMERICAN: 82 mL/min/{1.73_m2} (ref >59)
GLUCOSE: 86 mg/dL (ref 65–99)
Globulin, Total: 2.9 g/dL (ref 1.5–4.5)
Potassium: 3.8 mmol/L (ref 3.5–5.2)
Sodium: 142 mmol/L (ref 134–144)
TOTAL PROTEIN: 7 g/dL (ref 6.0–8.5)

## 2017-11-03 LAB — VITAMIN D 25 HYDROXY (VIT D DEFICIENCY, FRACTURES): Vit D, 25-Hydroxy: 40.3 ng/mL (ref 30.0–100.0)

## 2017-11-03 LAB — INSULIN, RANDOM: INSULIN: 6.2 u[IU]/mL (ref 2.6–24.9)

## 2017-11-03 LAB — HEMOGLOBIN A1C
Est. average glucose Bld gHb Est-mCnc: 117 mg/dL
HEMOGLOBIN A1C: 5.7 % — AB (ref 4.8–5.6)

## 2017-11-03 NOTE — Progress Notes (Signed)
Office: [phone]  /  Fax: [phone]   HPI:   Chief Complaint: OBESITY Ruth [name] is here to discuss her progress with her obesity treatment plan. She is on the keep a food journal with 1200-1400 calories and 75+ grams of protein daily and is following her eating plan approximately 70 % of the time. She states she is walking and on the elliptical for 20-30 minutes 7 times per week. Ruth [name] has had increased stress and notes increased comfort eating and snacking especially at work. She is doing well otherwise with lean protein and vegetables.  Her weight is 203 lb (92.1 kg) today and has not lost weight since her last visit. She has lost 13 lbs since starting treatment with [place].  Vitamin D Deficiency Ruth [name] has a diagnosis of vitamin D deficiency. She is stable on prescrtiption Vit D, due for labs and not yet at goal. She denies nausea, vomiting or muscle weakness.  Pre-Diabetes Ruth [name] has a diagnosis of pre-diabetes based on her elevated Hgb A1c and was informed this puts her at greater risk of developing diabetes. She is not taking metformin currently and attempting to improve with diet, she notes decreased polyphagia and due for labs. She denies nausea or hypoglycemia.  At risk for diabetes Ruth [name] is at higher than average risk for developing diabetes due to her obesity and pre-diabetes. She currently denies polyuria or polydipsia.  ALLERGIES: Allergies  Allergen Reactions  . Other     Nuts     MEDICATIONS: Current Outpatient Medications on File Prior to Visit  Medication Sig Dispense Refill  . albuterol (PROAIR HFA) 108 (90 Base) MCG/ACT inhaler Inhale 2 puffs into the lungs every 6 (six) hours as needed. 18 g 12  . amLODipine (NORVASC) 5 MG tablet Take 5 mg by mouth daily.    . cetirizine (ZYRTEC) 10 MG chewable tablet Chew 1 tablet (10 mg total) by mouth daily. As needed. 90 tablet 1  . desonide (DESOWEN) 0.05 % lotion Apply topically 2 (two) times daily. 118 mL 0  . EPINEPHrine  (EPI-PEN) 0.3 mg/0.3 mL SOAJ injection Inject into upper thigh for severe allergic reaction 1 Device prn  . fluticasone (FLONASE) 50 MCG/ACT nasal spray 2 SPRAYS EACH NOSTRIL EVERY DAY 16 g 2  . omeprazole (PRILOSEC) 40 MG capsule Take 1 capsule (40 mg total) by mouth daily. 30 capsule 3  . valACYclovir (VALTREX) 1000 MG tablet Take 1 tablet (1,000 mg total) by mouth 3 (three) times daily. 30 tablet 3   No current facility-administered medications on file prior to visit.     PAST MEDICAL HISTORY: Past Medical History:  Diagnosis Date  . Allergic rhinitis, cause unspecified   . Asthma   . Eczema   . Food allergy   . Gallbladder problem   . Headache   . Hypertension     PAST SURGICAL HISTORY: Past Surgical History:  Procedure Laterality Date  . CESAREAN SECTION    . CESAREAN SECTION Bilateral [id number]    SOCIAL HISTORY: Social History   Tobacco Use  . Smoking status: Never Smoker  . Smokeless tobacco: Never Used  Substance Use Topics  . Alcohol use: Yes    Alcohol/week: 0.0 standard drinks    Comment: one glass of wine per week/fim  . Drug use: No    FAMILY HISTORY: Family History  Problem Relation Age of Onset  . Asthma Mother   . Allergies Mother   . Asthma Father   . Drug abuse Father   . Allergies  Father     ROS: Review of Systems  Constitutional: Negative for weight loss.  Gastrointestinal: Negative for nausea and vomiting.  Genitourinary: Negative for frequency.  Musculoskeletal:       Negative muscle weakness  Endo/Heme/Allergies: Negative for polydipsia.       Positive polyphagia Negative hypoglycemia    PHYSICAL EXAM: Blood pressure 108/75, pulse 76, temperature 98.3 F (36.8 C), temperature source Oral, height 5\' 5"  (1.651 m), weight 203 lb (92.1 kg), last menstrual period 09/19/2015, SpO2 100 %. Body mass index is 33.78 kg/m. Physical Exam  Constitutional: She is oriented to person, place, and time. She appears well-developed and  well-nourished.  Cardiovascular: Normal rate.  Pulmonary/Chest: Effort normal.  Musculoskeletal: Normal range of motion.  Neurological: She is oriented to person, place, and time.  Skin: Skin is warm and dry.  Psychiatric: She has a normal mood and affect. Her behavior is normal.  Vitals reviewed.   RECENT LABS AND TESTS: BMET    Component Value Date/Time   NA 142 11/02/2017 0000   K 3.8 11/02/2017 0000   CL 103 11/02/2017 0000   CO2 24 11/02/2017 0000   GLUCOSE 86 11/02/2017 0000   GLUCOSE 89 04/18/2017 0936   BUN 12 11/02/2017 0000   CREATININE 0.92 11/02/2017 0000   CREATININE 0.82 08/24/2013 1633   CALCIUM 9.3 11/02/2017 0000   GFRNONAA 71 11/02/2017 0000   GFRAA 82 11/02/2017 0000   Lab Results  Component Value Date   HGBA1C 5.7 (H) 11/02/2017   HGBA1C 5.7 (H) 07/21/2017   Lab Results  Component Value Date   INSULIN 6.2 11/02/2017   INSULIN 15.0 07/21/2017   CBC    Component Value Date/Time   WBC 4.7 07/21/2017 0941   WBC 5.5 03/21/2017 0934   RBC 4.97 07/21/2017 0941   RBC 4.96 03/21/2017 0934   HGB 14.0 07/21/2017 0941   HCT 42.2 07/21/2017 0941   PLT 198.0 03/21/2017 0934   MCV 85 07/21/2017 0941   MCH 28.2 07/21/2017 0941   MCH 28.4 08/24/2013 1633   MCHC 33.2 07/21/2017 0941   MCHC 32.4 03/21/2017 0934   RDW 14.8 07/21/2017 0941   LYMPHSABS 1.6 07/21/2017 0941   MONOABS 0.3 03/21/2017 0934   EOSABS 0.1 07/21/2017 0941   BASOSABS 0.0 07/21/2017 0941   Iron/TIBC/Ferritin/ %Sat No results found for: IRON, TIBC, FERRITIN, IRONPCTSAT Lipid Panel     Component Value Date/Time   CHOL 159 07/21/2017 0941   TRIG 59 07/21/2017 0941   HDL 67 07/21/2017 0941   CHOLHDL 3 03/21/2017 0934   VLDL 17.6 03/21/2017 0934   LDLCALC 80 07/21/2017 0941   Hepatic Function Panel     Component Value Date/Time   PROT 7.0 11/02/2017 0000   ALBUMIN 4.1 11/02/2017 0000   AST 21 11/02/2017 0000   ALT 22 11/02/2017 0000   ALKPHOS 130 (H) 11/02/2017 0000    BILITOT 0.4 11/02/2017 0000   BILIDIR 0.1 10/10/2014 1133   IBILI 0.3 08/24/2013 1633      Component Value Date/Time   TSH 1.530 07/21/2017 0941   TSH 2.12 03/21/2017 0934   TSH 1.54 10/30/2015 1144  Results for Ruth, [name] (MRN [id number]) as of 11/03/2017 16:57  Ref. Range 11/02/2017 00:00  Vitamin D, 25-Hydroxy Latest Ref Range: 30.0 - 100.0 ng/mL 40.3    ASSESSMENT AND PLAN: Vitamin D deficiency - Plan: VITAMIN D 25 Hydroxy (Vit-D Deficiency, Fractures), Vitamin D, Ergocalciferol, (DRISDOL) 50000 units CAPS capsule  Prediabetes - Plan: Comprehensive metabolic panel,  Hemoglobin A1c, Insulin, random  At risk for diabetes mellitus  Class 1 obesity with serious comorbidity and body mass index (BMI) of 33.0 to 33.9 in adult, unspecified obesity type  PLAN:  Vitamin D Deficiency Ruth [name] was informed that low vitamin D levels contributes to fatigue and are associated with obesity, breast, and colon cancer. Ruth [name] agrees to continue taking prescription Vit D @50 ,000 IU every week #4 and we will refill for 1 month. She will follow up for routine testing of vitamin D, at least 2-3 times per year. She was informed of the risk of over-replacement of vitamin D and agrees to not increase her dose unless she discusses this with [place] first. We will check labs and Ruth [name] agrees to follow up with our clinic in 2 to 3 weeks  Pre-Diabetes Ruth [name] will continue to work on weight loss, diet, exercise, and decreasing simple carbohydrates in her diet to help decrease the risk of diabetes. We dicussed metformin including benefits and risks. She was informed that eating too many simple carbohydrates or too many calories at one sitting increases the likelihood of GI side effects. Ruth [name] declined metformin for now and a prescription was not written today. We will check labs and Ruth [name] agrees to follow up with our clinic in 2 to 3 weeks as directed to monitor her progress.  Diabetes risk counselling Ruth [name] was given  extended (15 minutes) diabetes prevention counseling today. She is 57 y.o. female and has risk factors for diabetes including obesity and pre-diabetes. We discussed intensive lifestyle modifications today with an emphasis on weight loss as well as increasing exercise and decreasing simple carbohydrates in her diet.  Obesity Ruth [name] is currently in the action stage of change. As such, her goal is to continue with weight loss efforts She has agreed to keep a food journal with 1200-1400 calories and 75+ grams of protein daily Ruth [name] has been instructed to work up to a goal of 150 minutes of combined cardio and strengthening exercise per week for weight loss and overall health benefits. We discussed the following Behavioral Modification Strategies today: increasing vegetables and work on meal planning and easy cooking plans, and better snacking choices   Ruth [name] has agreed to follow up with our clinic in 2 to 3 weeks. She was informed of the importance of frequent follow up visits to maximize her success with intensive lifestyle modifications for her multiple health conditions.   OBESITY BEHAVIORAL INTERVENTION VISIT  Today's visit was # 6 out of 22.  Starting weight: 216 lbs Starting date: 07/21/17 Today's weight : 203 lbs  Today's date: 11/02/2017 Total lbs lost to date: 13    ASK: We discussed the diagnosis of obesity with Ruth [name] today and Ruth [name] agreed to give [place] permission to discuss obesity behavioral modification therapy today.  ASSESS: Ruth [name] has the diagnosis of obesity and her BMI today is 33.78 Ruth [name] is in the action stage of change   ADVISE: Ruth [name] was educated on the multiple health risks of obesity as well as the benefit of weight loss to improve her health. She was advised of the need for long term treatment and the importance of lifestyle modifications.  AGREE: Multiple dietary modification options and treatment options were discussed and  Ruth [name] agreed to the above  obesity treatment plan.  Wilhemena Durie, am acting as transcriptionist for Dennard Nip, MD

## 2017-11-04 NOTE — Telephone Encounter (Signed)
Reference # for insurance call is [id number]. Also pt out of pocket max id 2500 and they have met 278.87 of this.

## 2017-11-04 NOTE — Telephone Encounter (Signed)
For Equinis brace( CPT [id number] and [id number])  the codes are valid and billable. Amount plan pays is 80% after $200 deductible. Nothing has been met toward the deductible. Called and left message for patient to return call.

## 2017-11-10 DIAGNOSIS — B9689 Other specified bacterial agents as the cause of diseases classified elsewhere: Secondary | ICD-10-CM | POA: Diagnosis not present

## 2017-11-10 DIAGNOSIS — N760 Acute vaginitis: Secondary | ICD-10-CM | POA: Diagnosis not present

## 2017-11-23 ENCOUNTER — Ambulatory Visit (INDEPENDENT_AMBULATORY_CARE_PROVIDER_SITE_OTHER): Payer: 59 | Admitting: Family Medicine

## 2017-11-23 VITALS — BP 108/77 | HR 84 | Temp 98.2°F | Ht 65.0 in | Wt 200.0 lb

## 2017-11-23 DIAGNOSIS — E669 Obesity, unspecified: Secondary | ICD-10-CM | POA: Diagnosis not present

## 2017-11-23 DIAGNOSIS — Z9189 Other specified personal risk factors, not elsewhere classified: Secondary | ICD-10-CM | POA: Diagnosis not present

## 2017-11-23 DIAGNOSIS — E559 Vitamin D deficiency, unspecified: Secondary | ICD-10-CM | POA: Diagnosis not present

## 2017-11-23 DIAGNOSIS — R7303 Prediabetes: Secondary | ICD-10-CM

## 2017-11-23 DIAGNOSIS — Z6833 Body mass index (BMI) 33.0-33.9, adult: Secondary | ICD-10-CM

## 2017-11-23 MED ORDER — VITAMIN D (ERGOCALCIFEROL) 1.25 MG (50000 UNIT) PO CAPS: 50000.0000 [IU] | ORAL_CAPSULE | ORAL | 0 refills | Status: DC

## 2017-11-23 NOTE — Progress Notes (Signed)
Office: [phone]  /  Fax: [phone]   HPI:   Chief Complaint: OBESITY Ruth [name] is here to discuss her progress with her obesity treatment plan. She is keeping a food journal with 1200 to 1400 calories and 75 grams of protein and is following her eating plan approximately 80 % of the time. She states she is walking 30 to 45 minutes 3 times per week. Ruth [name] is doing well with weight loss. She is journaling more, but still struggling to meet her protein goal.  Her weight is 200 lb (90.7 kg) today and has had a weight loss of 3 pounds over a period of 3 weeks since her last visit. She has lost 16 lbs since starting treatment with [place].  Vitamin D deficiency Ruth [name] has a diagnosis of vitamin D deficiency. She is currently taking vit D. Her vit D level is slowly improving, but not yet at goal. She denies nausea, vomiting or muscle weakness.  Pre-Diabetes Ruth [name] has a diagnosis of prediabetes based on her elevated HgA1c and was informed this puts her at greater risk of developing diabetes. She is doing well with diet and fasting insulin has improved, but A!c is still elevated at 5.7. She is not taking metformin currently and continues to work on diet and exercise to decrease risk of diabetes. She denies nausea or hypoglycemia.  ALLERGIES: Allergies  Allergen Reactions  . Other     Nuts     MEDICATIONS: Current Outpatient Medications on File Prior to Visit  Medication Sig Dispense Refill  . albuterol (PROAIR HFA) 108 (90 Base) MCG/ACT inhaler Inhale 2 puffs into the lungs every 6 (six) hours as needed. 18 g 12  . amLODipine (NORVASC) 5 MG tablet Take 5 mg by mouth daily.    . cetirizine (ZYRTEC) 10 MG chewable tablet Chew 1 tablet (10 mg total) by mouth daily. As needed. 90 tablet 1  . desonide (DESOWEN) 0.05 % lotion Apply topically 2 (two) times daily. 118 mL 0  . EPINEPHrine (EPI-PEN) 0.3 mg/0.3 mL SOAJ injection Inject into upper thigh for severe allergic reaction 1 Device prn  .  fluticasone (FLONASE) 50 MCG/ACT nasal spray 2 SPRAYS EACH NOSTRIL EVERY DAY 16 g 2  . omeprazole (PRILOSEC) 40 MG capsule Take 1 capsule (40 mg total) by mouth daily. 30 capsule 3  . valACYclovir (VALTREX) 1000 MG tablet Take 1 tablet (1,000 mg total) by mouth 3 (three) times daily. 30 tablet 3   No current facility-administered medications on file prior to visit.     PAST MEDICAL HISTORY: Past Medical History:  Diagnosis Date  . Allergic rhinitis, cause unspecified   . Asthma   . Eczema   . Food allergy   . Gallbladder problem   . Headache   . Hypertension     PAST SURGICAL HISTORY: Past Surgical History:  Procedure Laterality Date  . CESAREAN SECTION    . CESAREAN SECTION Bilateral [id number]    SOCIAL HISTORY: Social History   Tobacco Use  . Smoking status: Never Smoker  . Smokeless tobacco: Never Used  Substance Use Topics  . Alcohol use: Yes    Alcohol/week: 0.0 standard drinks    Comment: one glass of wine per week/fim  . Drug use: No    FAMILY HISTORY: Family History  Problem Relation Age of Onset  . Asthma Mother   . Allergies Mother   . Asthma Father   . Drug abuse Father   . Allergies Father     ROS: Review of  Systems  Constitutional: Positive for weight loss.  Gastrointestinal: Negative for nausea and vomiting.  Musculoskeletal:       Negative for muscle weakness.  Endo/Heme/Allergies:       Negative for hypoglycemia.    PHYSICAL EXAM: Blood pressure 108/77, pulse 84, temperature 98.2 F (36.8 C), temperature source Oral, height 5\' 5"  (1.651 m), weight 200 lb (90.7 kg), last menstrual period 09/19/2015, SpO2 99 %. Body mass index is 33.28 kg/m. Physical Exam  Constitutional: She is oriented to person, place, and time. She appears well-developed and well-nourished.  Cardiovascular: Normal rate.  Pulmonary/Chest: Effort normal.  Musculoskeletal: Normal range of motion.  Neurological: She is oriented to person, place, and time.  Skin: Skin  is warm and dry.  Psychiatric: She has a normal mood and affect. Her behavior is normal.  Vitals reviewed.   RECENT LABS AND TESTS: BMET    Component Value Date/Time   NA 142 11/02/2017 0000   K 3.8 11/02/2017 0000   CL 103 11/02/2017 0000   CO2 24 11/02/2017 0000   GLUCOSE 86 11/02/2017 0000   GLUCOSE 89 04/18/2017 0936   BUN 12 11/02/2017 0000   CREATININE 0.92 11/02/2017 0000   CREATININE 0.82 08/24/2013 1633   CALCIUM 9.3 11/02/2017 0000   GFRNONAA 71 11/02/2017 0000   GFRAA 82 11/02/2017 0000   Lab Results  Component Value Date   HGBA1C 5.7 (H) 11/02/2017   HGBA1C 5.7 (H) 07/21/2017   Lab Results  Component Value Date   INSULIN 6.2 11/02/2017   INSULIN 15.0 07/21/2017   CBC    Component Value Date/Time   WBC 4.7 07/21/2017 0941   WBC 5.5 03/21/2017 0934   RBC 4.97 07/21/2017 0941   RBC 4.96 03/21/2017 0934   HGB 14.0 07/21/2017 0941   HCT 42.2 07/21/2017 0941   PLT 198.0 03/21/2017 0934   MCV 85 07/21/2017 0941   MCH 28.2 07/21/2017 0941   MCH 28.4 08/24/2013 1633   MCHC 33.2 07/21/2017 0941   MCHC 32.4 03/21/2017 0934   RDW 14.8 07/21/2017 0941   LYMPHSABS 1.6 07/21/2017 0941   MONOABS 0.3 03/21/2017 0934   EOSABS 0.1 07/21/2017 0941   BASOSABS 0.0 07/21/2017 0941   Iron/TIBC/Ferritin/ %Sat No results found for: IRON, TIBC, FERRITIN, IRONPCTSAT Lipid Panel     Component Value Date/Time   CHOL 159 07/21/2017 0941   TRIG 59 07/21/2017 0941   HDL 67 07/21/2017 0941   CHOLHDL 3 03/21/2017 0934   VLDL 17.6 03/21/2017 0934   LDLCALC 80 07/21/2017 0941   Hepatic Function Panel     Component Value Date/Time   PROT 7.0 11/02/2017 0000   ALBUMIN 4.1 11/02/2017 0000   AST 21 11/02/2017 0000   ALT 22 11/02/2017 0000   ALKPHOS 130 (H) 11/02/2017 0000   BILITOT 0.4 11/02/2017 0000   BILIDIR 0.1 10/10/2014 1133   IBILI 0.3 08/24/2013 1633      Component Value Date/Time   TSH 1.530 07/21/2017 0941   TSH 2.12 03/21/2017 0934   TSH 1.54 10/30/2015  1144   Results for Ruth, [name] (MRN [id number]) as of 11/23/2017 17:47  Ref. Range 11/02/2017 00:00  Vitamin D, 25-Hydroxy Latest Ref Range: 30.0 - 100.0 ng/mL 40.3   ASSESSMENT AND PLAN: Vitamin D deficiency - Plan: Vitamin D, Ergocalciferol, (DRISDOL) 50000 units CAPS capsule  Prediabetes  At risk for diabetes mellitus  Class 1 obesity with serious comorbidity and body mass index (BMI) of 33.0 to 33.9 in adult, unspecified obesity type  PLAN:  Vitamin D Deficiency Keiera was informed that low vitamin D levels contributes to fatigue and are associated with obesity, breast, and colon cancer. She agrees to continue to take prescription Vit D @50 ,000 IU every week #4 with no refills and will follow up for routine testing of vitamin D, at least 2-3 times per year. She was informed of the risk of over-replacement of vitamin D and agrees to not increase her dose unless she discusses this with [place] first. She agrees to follow up in 2 to 3 weeks.  Pre-Diabetes Latina will continue to work on weight loss, exercise, and decreasing simple carbohydrates in her diet to help decrease the risk of diabetes. She was informed that eating too many simple carbohydrates or too many calories at one sitting increases the likelihood of GI side effects.  Kaylean agreed to follow up with [place] as directed to monitor her progress.  Obesity Serrita is currently in the action stage of change. As such, her goal is to continue with weight loss efforts. She has agreed to keep a food journal with 1200 to 1400 calories and 75 grams protein. Fable has been instructed to work up to a goal of 150 minutes of combined cardio and strengthening exercise per week for weight loss and overall health benefits. We discussed the following Behavioral Modification Strategies today: increasing lean protein intake, decreasing simple carbohydrates, increasing vegetables, and keep a strict food journal.  We discussed ways to increase  protein.  Sheniya has agreed to follow up with our clinic in 2 to 3 weeks. She was informed of the importance of frequent follow up visits to maximize her success with intensive lifestyle modifications for her multiple health conditions.   OBESITY BEHAVIORAL INTERVENTION VISIT  Today's visit was # 7   Starting weight: 216 lbs Starting date: 07/21/17 Today's weight :200 lb (90.7 kg)  Today's date: 11/23/2017 Total lbs lost to date: 16   ASK: We discussed the diagnosis of obesity with Reba M Segreto today and Marcelle agreed to give [place] permission to discuss obesity behavioral modification therapy today.  ASSESS: Derrian has the diagnosis of obesity and her BMI today is 33.28. Bobetta is in the action stage of change.   ADVISE: Suzane was educated on the multiple health risks of obesity as well as the benefit of weight loss to improve her health. She was advised of the need for long term treatment and the importance of lifestyle modifications to improve her current health and to decrease her risk of future health problems.  AGREE: Multiple dietary modification options and treatment options were discussed and Imara agreed to follow the recommendations documented in the above note.  ARRANGE: Lacreasha was educated on the importance of frequent visits to treat obesity as outlined per CMS and USPSTF guidelines and agreed to schedule her next follow up appointment today.  I, Marcille Blanco, am acting as transcriptionist for Starlyn Skeans, MD I have reviewed the above documentation for accuracy and completeness, and I agree with the above. -Dennard Nip, MD

## 2017-12-18 ENCOUNTER — Encounter (INDEPENDENT_AMBULATORY_CARE_PROVIDER_SITE_OTHER): Payer: Self-pay | Admitting: Family Medicine

## 2017-12-21 ENCOUNTER — Ambulatory Visit (INDEPENDENT_AMBULATORY_CARE_PROVIDER_SITE_OTHER): Payer: Self-pay | Admitting: Family Medicine

## 2017-12-21 ENCOUNTER — Encounter (INDEPENDENT_AMBULATORY_CARE_PROVIDER_SITE_OTHER): Payer: Self-pay

## 2018-01-05 ENCOUNTER — Ambulatory Visit (INDEPENDENT_AMBULATORY_CARE_PROVIDER_SITE_OTHER): Payer: 59 | Admitting: Bariatrics

## 2018-01-05 ENCOUNTER — Encounter (INDEPENDENT_AMBULATORY_CARE_PROVIDER_SITE_OTHER): Payer: Self-pay | Admitting: Bariatrics

## 2018-01-05 VITALS — BP 122/84 | HR 84 | Temp 98.5°F | Ht 65.0 in | Wt 199.0 lb

## 2018-01-05 DIAGNOSIS — R7303 Prediabetes: Secondary | ICD-10-CM

## 2018-01-05 DIAGNOSIS — E559 Vitamin D deficiency, unspecified: Secondary | ICD-10-CM

## 2018-01-05 DIAGNOSIS — Z6833 Body mass index (BMI) 33.0-33.9, adult: Secondary | ICD-10-CM

## 2018-01-05 DIAGNOSIS — Z9189 Other specified personal risk factors, not elsewhere classified: Secondary | ICD-10-CM

## 2018-01-05 DIAGNOSIS — E669 Obesity, unspecified: Secondary | ICD-10-CM

## 2018-01-05 MED ORDER — VITAMIN D (ERGOCALCIFEROL) 1.25 MG (50000 UNIT) PO CAPS: 50000.0000 [IU] | ORAL_CAPSULE | ORAL | 0 refills | Status: DC

## 2018-01-10 NOTE — Progress Notes (Signed)
Office: [phone]  /  Fax: [phone]   HPI:   Chief Complaint: OBESITY Ruth [name] is here to discuss her progress with her obesity treatment plan. She is on the keep a food journal with 1200-1400 calories and 75 grams of protein daily and is following her eating plan approximately 40 % of the time. She states she is exercising 0 minutes 0 times per week. [name] states "my knee hurts", this has prevented her from being as active (had been on the elliptical). She notes increased stress and less meal planning.  Her weight is 199 lb (90.3 kg) today and has had a weight loss of 1 pound over a period of 6 weeks since her last visit. She has lost 17 lbs since starting treatment with [place].  Vitamin D Deficiency [name] has a diagnosis of vitamin D deficiency. She is currently taking prescription Vit D and denies nausea, vomiting or muscle weakness.  Pre-Diabetes [name] has a diagnosis of pre-diabetes based on her elevated Hgb A1c and was informed this puts her at greater risk of developing diabetes. Last A1c was 5.7 on 11/02/17, but insulin has improved at 6.2. She is not on medications and continues to work on diet and exercise to decrease risk of diabetes. She denies nausea or hypoglycemia.  At risk for diabetes [name] is at higher than average risk for developing diabetes due to her obesity and pre-diabetes. She currently denies polyuria or polydipsia.  ALLERGIES: Allergies  Allergen Reactions  . Other     Nuts     MEDICATIONS: Current Outpatient Medications on File Prior to Visit  Medication Sig Dispense Refill  . albuterol (PROAIR HFA) 108 (90 Base) MCG/ACT inhaler Inhale 2 puffs into the lungs every 6 (six) hours as needed. 18 g 12  . amLODipine (NORVASC) 5 MG tablet Take 5 mg by mouth daily.    . cetirizine (ZYRTEC) 10 MG chewable tablet Chew 1 tablet (10 mg total) by mouth daily. As needed. 90 tablet 1  . desonide (DESOWEN) 0.05 % lotion Apply topically 2 (two) times daily. 118 mL 0  .  EPINEPHrine (EPI-PEN) 0.3 mg/0.3 mL SOAJ injection Inject into upper thigh for severe allergic reaction 1 Device prn  . fluticasone (FLONASE) 50 MCG/ACT nasal spray 2 SPRAYS EACH NOSTRIL EVERY DAY 16 g 2  . omeprazole (PRILOSEC) 40 MG capsule Take 1 capsule (40 mg total) by mouth daily. 30 capsule 3  . valACYclovir (VALTREX) 1000 MG tablet Take 1 tablet (1,000 mg total) by mouth 3 (three) times daily. 30 tablet 3   No current facility-administered medications on file prior to visit.     PAST MEDICAL HISTORY: Past Medical History:  Diagnosis Date  . Allergic rhinitis, cause unspecified   . Asthma   . Eczema   . Food allergy   . Gallbladder problem   . Headache   . Hypertension     PAST SURGICAL HISTORY: Past Surgical History:  Procedure Laterality Date  . CESAREAN SECTION    . CESAREAN SECTION Bilateral [id number]    SOCIAL HISTORY: Social History   Tobacco Use  . Smoking status: Never Smoker  . Smokeless tobacco: Never Used  Substance Use Topics  . Alcohol use: Yes    Alcohol/week: 0.0 standard drinks    Comment: one glass of wine per week/fim  . Drug use: No    FAMILY HISTORY: Family History  Problem Relation Age of Onset  . Asthma Mother   . Allergies Mother   . Asthma Father   .  Drug abuse Father   . Allergies Father     ROS: Review of Systems  Constitutional: Positive for weight loss.  Gastrointestinal: Negative for nausea and vomiting.  Genitourinary: Negative for frequency.  Musculoskeletal:       Negative muscle weakness  Endo/Heme/Allergies: Negative for polydipsia.       Negative hypoglycemia    PHYSICAL EXAM: Blood pressure 122/84, pulse 84, temperature 98.5 F (36.9 C), temperature source Oral, height 5\' 5"  (1.651 m), weight 199 lb (90.3 kg), last menstrual period 09/19/2015, SpO2 99 %. Body mass index is 33.12 kg/m. Physical Exam  Constitutional: She is oriented to person, place, and time. She appears well-developed and well-nourished.    Cardiovascular: Normal rate.  Pulmonary/Chest: Effort normal.  Musculoskeletal: Normal range of motion.  Neurological: She is oriented to person, place, and time.  Skin: Skin is warm and dry.  Psychiatric: She has a normal mood and affect. Her behavior is normal.  Vitals reviewed.   RECENT LABS AND TESTS: BMET    Component Value Date/Time   NA 142 11/02/2017 0000   K 3.8 11/02/2017 0000   CL 103 11/02/2017 0000   CO2 24 11/02/2017 0000   GLUCOSE 86 11/02/2017 0000   GLUCOSE 89 04/18/2017 0936   BUN 12 11/02/2017 0000   CREATININE 0.92 11/02/2017 0000   CREATININE 0.82 08/24/2013 1633   CALCIUM 9.3 11/02/2017 0000   GFRNONAA 71 11/02/2017 0000   GFRAA 82 11/02/2017 0000   Lab Results  Component Value Date   HGBA1C 5.7 (H) 11/02/2017   HGBA1C 5.7 (H) 07/21/2017   Lab Results  Component Value Date   INSULIN 6.2 11/02/2017   INSULIN 15.0 07/21/2017   CBC    Component Value Date/Time   WBC 4.7 07/21/2017 0941   WBC 5.5 03/21/2017 0934   RBC 4.97 07/21/2017 0941   RBC 4.96 03/21/2017 0934   HGB 14.0 07/21/2017 0941   HCT 42.2 07/21/2017 0941   PLT 198.0 03/21/2017 0934   MCV 85 07/21/2017 0941   MCH 28.2 07/21/2017 0941   MCH 28.4 08/24/2013 1633   MCHC 33.2 07/21/2017 0941   MCHC 32.4 03/21/2017 0934   RDW 14.8 07/21/2017 0941   LYMPHSABS 1.6 07/21/2017 0941   MONOABS 0.3 03/21/2017 0934   EOSABS 0.1 07/21/2017 0941   BASOSABS 0.0 07/21/2017 0941   Iron/TIBC/Ferritin/ %Sat No results found for: IRON, TIBC, FERRITIN, IRONPCTSAT Lipid Panel     Component Value Date/Time   CHOL 159 07/21/2017 0941   TRIG 59 07/21/2017 0941   HDL 67 07/21/2017 0941   CHOLHDL 3 03/21/2017 0934   VLDL 17.6 03/21/2017 0934   LDLCALC 80 07/21/2017 0941   Hepatic Function Panel     Component Value Date/Time   PROT 7.0 11/02/2017 0000   ALBUMIN 4.1 11/02/2017 0000   AST 21 11/02/2017 0000   ALT 22 11/02/2017 0000   ALKPHOS 130 (H) 11/02/2017 0000   BILITOT 0.4  11/02/2017 0000   BILIDIR 0.1 10/10/2014 1133   IBILI 0.3 08/24/2013 1633      Component Value Date/Time   TSH 1.530 07/21/2017 0941   TSH 2.12 03/21/2017 0934   TSH 1.54 10/30/2015 1144  Results for NONI, STONESIFER (MRN [id number]) as of 01/10/2018 15:07  Ref. Range 11/02/2017 00:00  Vitamin D, 25-Hydroxy Latest Ref Range: 30.0 - 100.0 ng/mL 40.3    ASSESSMENT AND PLAN: Vitamin D deficiency - Plan: Vitamin D, Ergocalciferol, (DRISDOL) 50000 units CAPS capsule  Prediabetes  At risk for diabetes mellitus  Class 1 obesity with serious comorbidity and body mass index (BMI) of 33.0 to 33.9 in adult, unspecified obesity type  PLAN:  Vitamin D Deficiency Ada was informed that low vitamin D levels contributes to fatigue and are associated with obesity, breast, and colon cancer. Anela agrees to continue taking prescription Vit D @50 ,000 IU every week #4 and we will refill for 1 month. She will follow up for routine testing of vitamin D, at least 2-3 times per year. She was informed of the risk of over-replacement of vitamin D and agrees to not increase her dose unless she discusses this with [place] first. Mccayla agrees to follow up with our clinic in 2 weeks.  Pre-Diabetes Calysta will continue to work on weight loss, exercise, and decreasing simple carbohydrates in her diet to help decrease the risk of diabetes. We dicussed metformin including benefits and risks. She was informed that eating too many simple carbohydrates or too many calories at one sitting increases the likelihood of GI side effects. Alexes declined metformin for now and a prescription was not written today. Nielle agrees to follow up with our clinic in 2 weeks as directed to monitor her progress.  Diabetes risk counselling Lakaya was given extended (15 minutes) diabetes prevention counseling today. She is 57 y.o. female and has risk factors for diabetes including obesity and pre-diabetes. We discussed intensive lifestyle  modifications today with an emphasis on weight loss as well as increasing exercise and decreasing simple carbohydrates in her diet.  Obesity Minh is currently in the action stage of change. As such, her goal is to continue with weight loss efforts She has agreed to keep a food journal with 1200-1400 calories and 75 grams of protein daily Mithra has been instructed to work up to a goal of 150 minutes of combined cardio and strengthening exercise per week for weight loss and overall health benefits. We discussed the following Behavioral Modification Strategies today: increasing lean protein intake, increasing vegetables, decrease eating out, work on meal planning and easy cooking plans, increase H20 intake, no skipping meals, and keeping healthy foods in the home Zyana will work on meal planning and "[employer]" handout was given.  Estephani has agreed to follow up with our clinic in 2 weeks. She was informed of the importance of frequent follow up visits to maximize her success with intensive lifestyle modifications for her multiple health conditions.   OBESITY BEHAVIORAL INTERVENTION VISIT  Today's visit was # 8   Starting weight: 216 lbs Starting date: 07/21/17 Today's weight : 199 lbs  Today's date: 01/05/2018 Total lbs lost to date: 41    ASK: We discussed the diagnosis of obesity with Kenney Houseman M Hasz today and Aviyanna agreed to give [place] permission to discuss obesity behavioral modification therapy today.  ASSESS: Casha has the diagnosis of obesity and her BMI today is 33.12 Nahara is in the action stage of change   ADVISE: Ailsa was educated on the multiple health risks of obesity as well as the benefit of weight loss to improve her health. She was advised of the need for long term treatment and the importance of lifestyle modifications to improve her current health and to decrease her risk of future health problems.  AGREE: Multiple dietary modification options and treatment options  were discussed and  Edelmira agreed to follow the recommendations documented in the above note.  ARRANGE: Onita was educated on the importance of frequent visits to treat obesity as outlined per CMS and USPSTF guidelines and  agreed to schedule her next follow up appointment today.  Wilhemena Durie, am acting as transcriptionist for [employer], DO  I have reviewed the above documentation for accuracy and completeness, and I agree with the above. -Jearld Lesch, DO

## 2018-01-23 ENCOUNTER — Ambulatory Visit (INDEPENDENT_AMBULATORY_CARE_PROVIDER_SITE_OTHER): Payer: Self-pay | Admitting: Family Medicine

## 2018-02-01 ENCOUNTER — Ambulatory Visit (INDEPENDENT_AMBULATORY_CARE_PROVIDER_SITE_OTHER): Payer: 59 | Admitting: Family Medicine

## 2018-02-01 VITALS — BP 130/87 | HR 92 | Temp 97.9°F

## 2018-02-01 DIAGNOSIS — F411 Generalized anxiety disorder: Secondary | ICD-10-CM

## 2018-02-01 DIAGNOSIS — Z9189 Other specified personal risk factors, not elsewhere classified: Secondary | ICD-10-CM

## 2018-02-01 DIAGNOSIS — E669 Obesity, unspecified: Secondary | ICD-10-CM

## 2018-02-01 DIAGNOSIS — E559 Vitamin D deficiency, unspecified: Secondary | ICD-10-CM | POA: Diagnosis not present

## 2018-02-01 DIAGNOSIS — Z6833 Body mass index (BMI) 33.0-33.9, adult: Secondary | ICD-10-CM

## 2018-02-01 MED ORDER — VITAMIN D (ERGOCALCIFEROL) 1.25 MG (50000 UNIT) PO CAPS
50000.0000 [IU] | ORAL_CAPSULE | ORAL | 0 refills | Status: DC
Start: 2018-02-01 — End: 2018-03-30

## 2018-02-01 MED ORDER — SERTRALINE HCL 25 MG PO TABS: 25.0000 mg | ORAL_TABLET | Freq: Every day | ORAL | 0 refills | Status: DC

## 2018-02-02 NOTE — Progress Notes (Signed)
Office: [phone]  /  Fax: [phone]   HPI:   Chief Complaint: OBESITY Ruth [name] is here to discuss her progress with her obesity treatment plan. She is keeping a food journal with 1200 to 1400 calories and 75 grams of protein and is following her eating plan approximately 10 % of the time. She states she is walking 45 minutes 2 times per week. Ruth [name] wasn't journaling and ate out approximately 90% of the time. 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 She has gravitated toward carbs.  Her weight is 202 lbs today and has had a weight gain of 3 pounds over a period of 4 weeks since her last visit. She has lost 14 lbs since starting treatment with [place].  Generalized Anxiety Disorder Ruth [name] admits to panic attacks and she has never been on medicine before. She is planning to start seeing a counselor.  Vitamin D deficiency Ruth [name] has a diagnosis of vitamin D deficiency. She is currently taking vit D. She admits fatigue and denies nausea, vomiting, or muscle weakness.  At risk for osteopenia and osteoporosis Ruth [name] is at higher risk of osteopenia and osteoporosis due to vitamin D deficiency.   ALLERGIES: Allergies  Allergen Reactions  . Other     Nuts     MEDICATIONS: Current Outpatient Medications on File Prior to Visit  Medication Sig Dispense Refill  . albuterol (PROAIR HFA) 108 (90 Base) MCG/ACT inhaler Inhale 2 puffs into the lungs every 6 (six) hours as needed. 18 g 12  . amLODipine (NORVASC) 5 MG tablet Take 5 mg by mouth daily.    . cetirizine (ZYRTEC) 10 MG chewable tablet Chew 1 tablet (10 mg total) by mouth daily. As needed. 90 tablet 1  . desonide (DESOWEN) 0.05 % lotion Apply topically 2 (two) times daily. 118 mL 0  . EPINEPHrine (EPI-PEN) 0.3 mg/0.3 mL SOAJ injection Inject into upper thigh for severe allergic reaction 1 Device prn  . fluticasone (FLONASE) 50 MCG/ACT nasal spray 2 SPRAYS EACH NOSTRIL EVERY DAY 16 g 2  . omeprazole (PRILOSEC) 40 MG capsule  Take 1 capsule (40 mg total) by mouth daily. 30 capsule 3  . valACYclovir (VALTREX) 1000 MG tablet Take 1 tablet (1,000 mg total) by mouth 3 (three) times daily. 30 tablet 3   No current facility-administered medications on file prior to visit.     PAST MEDICAL HISTORY: Past Medical History:  Diagnosis Date  . Allergic rhinitis, cause unspecified   . Asthma   . Eczema   . Food allergy   . Gallbladder problem   . Headache   . Hypertension     PAST SURGICAL HISTORY: Past Surgical History:  Procedure Laterality Date  . CESAREAN SECTION    . CESAREAN SECTION Bilateral [id number]    SOCIAL HISTORY: Social History   Tobacco Use  . Smoking status: Never Smoker  . Smokeless tobacco: Never Used  Substance Use Topics  . Alcohol use: Yes    Alcohol/week: 0.0 standard drinks    Comment: one glass of wine per week/fim  . Drug use: No    FAMILY HISTORY: Family History  Problem Relation Age of Onset  . Asthma Mother   . Allergies Mother   . Asthma Father   . Drug abuse Father   . Allergies Father     ROS: Review of Systems  Constitutional: Positive for malaise/fatigue. Negative for weight loss.  Gastrointestinal: Negative for nausea and vomiting.  Musculoskeletal:  Negative for muscle weakness.  Psychiatric/Behavioral:       Positive for panic attacks.    PHYSICAL EXAM: Blood pressure 130/87, pulse 92, temperature 97.9 F (36.6 C), temperature source Oral, last menstrual period 09/19/2015, SpO2 99 %. There is no height or weight on file to calculate BMI. Physical Exam  Constitutional: She is oriented to person, place, and time. She appears well-developed and well-nourished.  Cardiovascular: Normal rate.  Pulmonary/Chest: Effort normal.  Musculoskeletal: Normal range of motion.  Neurological: She is oriented to person, place, and time.  Skin: Skin is warm and dry.  Psychiatric: She has a normal mood and affect. Her behavior is normal.  Vitals  reviewed.   RECENT LABS AND TESTS: BMET    Component Value Date/Time   NA 142 11/02/2017 0000   K 3.8 11/02/2017 0000   CL 103 11/02/2017 0000   CO2 24 11/02/2017 0000   GLUCOSE 86 11/02/2017 0000   GLUCOSE 89 04/18/2017 0936   BUN 12 11/02/2017 0000   CREATININE 0.92 11/02/2017 0000   CREATININE 0.82 08/24/2013 1633   CALCIUM 9.3 11/02/2017 0000   GFRNONAA 71 11/02/2017 0000   GFRAA 82 11/02/2017 0000   Lab Results  Component Value Date   HGBA1C 5.7 (H) 11/02/2017   HGBA1C 5.7 (H) 07/21/2017   Lab Results  Component Value Date   INSULIN 6.2 11/02/2017   INSULIN 15.0 07/21/2017   CBC    Component Value Date/Time   WBC 4.7 07/21/2017 0941   WBC 5.5 03/21/2017 0934   RBC 4.97 07/21/2017 0941   RBC 4.96 03/21/2017 0934   HGB 14.0 07/21/2017 0941   HCT 42.2 07/21/2017 0941   PLT 198.0 03/21/2017 0934   MCV 85 07/21/2017 0941   MCH 28.2 07/21/2017 0941   MCH 28.4 08/24/2013 1633   MCHC 33.2 07/21/2017 0941   MCHC 32.4 03/21/2017 0934   RDW 14.8 07/21/2017 0941   LYMPHSABS 1.6 07/21/2017 0941   MONOABS 0.3 03/21/2017 0934   EOSABS 0.1 07/21/2017 0941   BASOSABS 0.0 07/21/2017 0941   Iron/TIBC/Ferritin/ %Sat No results found for: IRON, TIBC, FERRITIN, IRONPCTSAT Lipid Panel     Component Value Date/Time   CHOL 159 07/21/2017 0941   TRIG 59 07/21/2017 0941   HDL 67 07/21/2017 0941   CHOLHDL 3 03/21/2017 0934   VLDL 17.6 03/21/2017 0934   LDLCALC 80 07/21/2017 0941   Hepatic Function Panel     Component Value Date/Time   PROT 7.0 11/02/2017 0000   ALBUMIN 4.1 11/02/2017 0000   AST 21 11/02/2017 0000   ALT 22 11/02/2017 0000   ALKPHOS 130 (H) 11/02/2017 0000   BILITOT 0.4 11/02/2017 0000   BILIDIR 0.1 10/10/2014 1133   IBILI 0.3 08/24/2013 1633      Component Value Date/Time   TSH 1.530 07/21/2017 0941   TSH 2.12 03/21/2017 0934   TSH 1.54 10/30/2015 1144   Results for Ruth, [name] (MRN [id number]) as of 02/02/2018 08:09  Ref. Range 11/02/2017  00:00  Vitamin D, 25-Hydroxy Latest Ref Range: 30.0 - 100.0 ng/mL 40.3   ASSESSMENT AND PLAN: GAD (generalized anxiety disorder) - Plan: sertraline (ZOLOFT) 25 MG tablet  Vitamin D deficiency - Plan: Vitamin D, Ergocalciferol, (DRISDOL) 1.25 MG (50000 UT) CAPS capsule  At risk for osteoporosis  Class 1 obesity with serious comorbidity and body mass index (BMI) of 33.0 to 33.9 in adult, unspecified obesity type  PLAN:  Generalized Anxiety Disorder Ruth [name] agrees to start taking Zoloft 25mg  PO qAM #30  with no refills and will follow up with is in 2 weeks.  Vitamin D Deficiency Ruth [name] was informed that low vitamin D levels contributes to fatigue and are associated with obesity, breast, and colon cancer. She agrees to continue to take prescription Vit D @50 ,000 IU every week #4 with no refills and will follow up for routine testing of vitamin D, at least 2-3 times per year. She was informed of the risk of over-replacement of vitamin D and agrees to not increase her dose unless she discusses this with [place] first. Ruth [name] agrees to follow up as directed.  At risk for osteopenia and osteoporosis Ruth [name] was given extended (15 minutes) osteoporosis prevention counseling today. Ruth [name] is at risk for osteopenia and osteoporosis due to her vitamin D deficiency. She was encouraged to take her vitamin D and follow her higher calcium diet and increase strengthening exercise to help strengthen her bones and decrease her risk of osteopenia and osteoporosis.  Obesity Ruth [name] is currently in the action stage of change. As such, her goal is to continue with weight loss efforts. She has agreed to keep a food journal with 1200 to 1400 calories and 75+ grams of protein. Ruth [name] has been instructed to work up to a goal of 150 minutes of combined cardio and strengthening exercise per week for weight loss and overall health benefits. We discussed the following Behavioral Modification Strategies today: increasing lean protein  intake, increasing vegetables, work on meal planning and easy cooking plans, and keeping a strict food journal.  Ruth [name] has agreed to follow up with our clinic in 2 weeks. She was informed of the importance of frequent follow up visits to maximize her success with intensive lifestyle modifications for her multiple health conditions.   OBESITY BEHAVIORAL INTERVENTION VISIT  Today's visit was # 9   Starting weight: 216 Starting date: 07/21/17 Today's weight : 202 lbs   Today's date: 02/01/2018 Total lbs lost to date: 14  ASK: We discussed the diagnosis of obesity with Ruth [name] today and Ruth [name] agreed to give [place] permission to discuss obesity behavioral modification therapy today.  ASSESS: Ruth [name] has the diagnosis of obesity and her BMI today is 33.7. Ruth [name] is in the action stage of change.   ADVISE: Ruth [name] was educated on the multiple health risks of obesity as well as the benefit of weight loss to improve her health. She was advised of the need for long term treatment and the importance of lifestyle modifications to improve her current health and to decrease her risk of future health problems.  AGREE: Multiple dietary modification options and treatment options were discussed and Ruth [name] agreed to follow the recommendations documented in the above note.  ARRANGE: Ruth [name] was educated on the importance of frequent visits to treat obesity as outlined per CMS and USPSTF guidelines and agreed to schedule her next follow up appointment today.  I, Marcille Blanco, am acting as Location manager for Eber Jones, MD  I have reviewed the above documentation for accuracy and completeness, and I agree with the above. - Ilene Qua, MD

## 2018-02-17 ENCOUNTER — Encounter (INDEPENDENT_AMBULATORY_CARE_PROVIDER_SITE_OTHER): Payer: Self-pay | Admitting: Family Medicine

## 2018-02-20 ENCOUNTER — Ambulatory Visit (INDEPENDENT_AMBULATORY_CARE_PROVIDER_SITE_OTHER): Payer: 59 | Admitting: Family Medicine

## 2018-02-20 ENCOUNTER — Encounter (INDEPENDENT_AMBULATORY_CARE_PROVIDER_SITE_OTHER): Payer: Self-pay

## 2018-02-20 NOTE — Telephone Encounter (Signed)
Please address

## 2018-02-21 ENCOUNTER — Ambulatory Visit: Payer: 59 | Admitting: Neurology

## 2018-02-28 ENCOUNTER — Encounter: Payer: Self-pay | Admitting: Neurology

## 2018-03-30 ENCOUNTER — Encounter (INDEPENDENT_AMBULATORY_CARE_PROVIDER_SITE_OTHER): Payer: Self-pay | Admitting: Family Medicine

## 2018-03-30 ENCOUNTER — Ambulatory Visit (INDEPENDENT_AMBULATORY_CARE_PROVIDER_SITE_OTHER): Payer: 59 | Admitting: Family Medicine

## 2018-03-30 VITALS — BP 126/77 | HR 80 | Temp 98.0°F | Ht 65.0 in | Wt 205.0 lb

## 2018-03-30 DIAGNOSIS — F411 Generalized anxiety disorder: Secondary | ICD-10-CM

## 2018-03-30 DIAGNOSIS — I10 Essential (primary) hypertension: Secondary | ICD-10-CM | POA: Diagnosis not present

## 2018-03-30 DIAGNOSIS — E559 Vitamin D deficiency, unspecified: Secondary | ICD-10-CM

## 2018-03-30 DIAGNOSIS — E669 Obesity, unspecified: Secondary | ICD-10-CM

## 2018-03-30 DIAGNOSIS — Z9189 Other specified personal risk factors, not elsewhere classified: Secondary | ICD-10-CM | POA: Diagnosis not present

## 2018-03-30 DIAGNOSIS — Z6834 Body mass index (BMI) 34.0-34.9, adult: Secondary | ICD-10-CM

## 2018-03-30 MED ORDER — VITAMIN D (ERGOCALCIFEROL) 1.25 MG (50000 UNIT) PO CAPS
50000.0000 [IU] | ORAL_CAPSULE | ORAL | 0 refills | Status: DC
Start: 2018-03-30 — End: 2018-04-13

## 2018-03-30 NOTE — Progress Notes (Signed)
Office: [phone]  /  Fax: [phone]   HPI:   Chief Complaint: OBESITY Ruth [name] is here to discuss her progress with her obesity treatment plan. She is on the keep a food journal with 1200-1400 calories and 75+ grams of protein daily and is following her eating plan approximately 1 % of the time. She states she is exercising 0 minutes 0 times per week. Ruth [name] restarted journaling after Christmas and has noticed increase in fried food cravings. She noticed change from previous cravings. Her refrigerator died after Christmas and she plans to get a new one delivered tomorrow. Her weight is 205 lb (93 kg) today and has gained 3 pounds since her last visit. She has lost 11 lbs since starting treatment with [place].  Vitamin D Deficiency Ruth [name] has a diagnosis of vitamin D deficiency. She is currently taking prescription Vit D She notes fatigue and denies nausea, vomiting or muscle weakness.  At risk for osteopenia and osteoporosis Ruth [name] is at higher risk of osteopenia and osteoporosis due to vitamin D deficiency.   Hypertension Ruth [name] is a 58 y.o. female with hypertension. Ruth [name]'s blood pressure is controlled today. She denies chest pain, chest pressure, or headaches. She is working weight loss to help control her blood pressure with the goal of decreasing her risk of heart attack and stroke.   GAD Ruth [name] has a diagnosis of generalized anxiety disorder. She notes significant decrease in panic attack frequency. She has noticed improvement in symptoms after increase in exercise.  ASSESSMENT AND PLAN:  Vitamin D deficiency - Plan: Vitamin D, Ergocalciferol, (DRISDOL) 1.25 MG (50000 UT) CAPS capsule  Essential hypertension  GAD (generalized anxiety disorder)  At risk for osteoporosis  Class 1 obesity with serious comorbidity and body mass index (BMI) of 34.0 to 34.9 in adult, unspecified obesity type  PLAN:  Vitamin D Deficiency Ruth [name] was informed that low vitamin D levels  contributes to fatigue and are associated with obesity, breast, and colon cancer. Ruth [name] agrees to continue taking prescription Vit D @50 ,000 IU every week #4 and we will refill for 1 month. She will follow up for routine testing of vitamin D, at least 2-3 times per year. She was informed of the risk of over-replacement of vitamin D and agrees to not increase her dose unless she discusses this with [place] first. Ruth [name] agrees to follow up with our clinic in 2 weeks.  At risk for osteopenia and osteoporosis Ruth [name] was given extended (15 minutes) osteoporosis prevention counseling today. Ruth [name] is at risk for osteopenia and osteoporsis due to her vitamin D deficiency. She was encouraged to take her vitamin D and follow her higher calcium diet and increase strengthening exercise to help strengthen her bones and decrease her risk of osteopenia and osteoporosis.  Hypertension We discussed sodium restriction, working on healthy weight loss, and a regular exercise program as the means to achieve improved blood pressure control. Ruth [name] agreed with this plan and agreed to follow up as directed. We will continue to monitor her blood pressure as well as her progress with the above lifestyle modifications. Ruth [name] agrees to continue taking amlodipine and will watch for signs of hypotension as she continues her lifestyle modifications. Ruth [name] agrees to follow up with our clinic in 2 weeks.  GAD Ruth [name] agrees to stop taking Zoloft and we will follow up on symptoms at next appointment. Ruth [name] agrees to follow up with our clinic in 2 weeks.  Obesity Ruth [name] is currently in the action stage of change. As such,  her goal is to continue with weight loss efforts She has agreed to keep a food journal with 1300-1400 calories and 75+ grams of protein daily Ruth [name] has been instructed to work up to a goal of 150 minutes of combined cardio and strengthening exercise per week for weight loss and overall health benefits. We discussed the  following Behavioral Modification Strategies today: increasing lean protein intake, increasing vegetables, work on meal planning and easy cooking plans, planning for success, and keep a strict food journal Ruth [name] is to start 2-3 weeks at [employer] (She is to [employer] if hungry).  Ruth [name] has agreed to follow up with our clinic in 2 weeks. She was informed of the importance of frequent follow up visits to maximize her success with intensive lifestyle modifications for her multiple health conditions.  ALLERGIES: Allergies  Allergen Reactions  . Other     Nuts     MEDICATIONS: Current Outpatient Medications on File Prior to Visit  Medication Sig Dispense Refill  . albuterol (PROAIR HFA) 108 (90 Base) MCG/ACT inhaler Inhale 2 puffs into the lungs every 6 (six) hours as needed. 18 g 12  . amLODipine (NORVASC) 5 MG tablet Take 5 mg by mouth daily.    . cetirizine (ZYRTEC) 10 MG chewable tablet Chew 1 tablet (10 mg total) by mouth daily. As needed. 90 tablet 1  . desonide (DESOWEN) 0.05 % lotion Apply topically 2 (two) times daily. 118 mL 0  . EPINEPHrine (EPI-PEN) 0.3 mg/0.3 mL SOAJ injection Inject into upper thigh for severe allergic reaction 1 Device prn  . fluticasone (FLONASE) 50 MCG/ACT nasal spray 2 SPRAYS EACH NOSTRIL EVERY DAY 16 g 2  . omeprazole (PRILOSEC) 40 MG capsule Take 1 capsule (40 mg total) by mouth daily. 30 capsule 3  . valACYclovir (VALTREX) 1000 MG tablet Take 1 tablet (1,000 mg total) by mouth 3 (three) times daily. 30 tablet 3  . Vitamin D, Ergocalciferol, (DRISDOL) 1.25 MG (50000 UT) CAPS capsule Take 1 capsule (50,000 Units total) by mouth every 7 (seven) days. 4 capsule 0   No current facility-administered medications on file prior to visit.     PAST MEDICAL HISTORY: Past Medical History:  Diagnosis Date  . Allergic rhinitis, cause unspecified   . Asthma   . Eczema   . Food allergy   . Gallbladder problem   . Headache   . Hypertension     PAST SURGICAL  HISTORY: Past Surgical History:  Procedure Laterality Date  . CESAREAN SECTION    . CESAREAN SECTION Bilateral [id number]    SOCIAL HISTORY: Social History   Tobacco Use  . Smoking status: Never Smoker  . Smokeless tobacco: Never Used  Substance Use Topics  . Alcohol use: Yes    Alcohol/week: 0.0 standard drinks    Comment: one glass of wine per week/fim  . Drug use: No    FAMILY HISTORY: Family History  Problem Relation Age of Onset  . Asthma Mother   . Allergies Mother   . Asthma Father   . Drug abuse Father   . Allergies Father     ROS: Review of Systems  Constitutional: Positive for malaise/fatigue. Negative for weight loss.  Cardiovascular: Negative for chest pain.       Negative chest pressure  Gastrointestinal: Negative for nausea and vomiting.  Musculoskeletal:       Negative muscle weakness  Neurological: Negative for headaches.  Psychiatric/Behavioral:       + Anxiety    PHYSICAL EXAM: Blood pressure  126/77, pulse 80, temperature 98 F (36.7 C), temperature source Oral, height 5\' 5"  (1.651 m), weight 205 lb (93 kg), last menstrual period 09/19/2015, SpO2 98 %. Body mass index is 34.11 kg/m. Physical Exam Vitals signs reviewed.  Constitutional:      Appearance: Normal appearance. She is obese.  Cardiovascular:     Rate and Rhythm: Normal rate.     Pulses: Normal pulses.  Pulmonary:     Effort: Pulmonary effort is normal.  Musculoskeletal: Normal range of motion.  Skin:    General: Skin is warm and dry.  Neurological:     Mental Status: She is alert and oriented to person, place, and time.  Psychiatric:        Mood and Affect: Mood normal.        Behavior: Behavior normal.     RECENT LABS AND TESTS: BMET    Component Value Date/Time   NA 142 11/02/2017 0000   K 3.8 11/02/2017 0000   CL 103 11/02/2017 0000   CO2 24 11/02/2017 0000   GLUCOSE 86 11/02/2017 0000   GLUCOSE 89 04/18/2017 0936   BUN 12 11/02/2017 0000   CREATININE 0.92  11/02/2017 0000   CREATININE 0.82 08/24/2013 1633   CALCIUM 9.3 11/02/2017 0000   GFRNONAA 71 11/02/2017 0000   GFRAA 82 11/02/2017 0000   Lab Results  Component Value Date   HGBA1C 5.7 (H) 11/02/2017   HGBA1C 5.7 (H) 07/21/2017   Lab Results  Component Value Date   INSULIN 6.2 11/02/2017   INSULIN 15.0 07/21/2017   CBC    Component Value Date/Time   WBC 4.7 07/21/2017 0941   WBC 5.5 03/21/2017 0934   RBC 4.97 07/21/2017 0941   RBC 4.96 03/21/2017 0934   HGB 14.0 07/21/2017 0941   HCT 42.2 07/21/2017 0941   PLT 198.0 03/21/2017 0934   MCV 85 07/21/2017 0941   MCH 28.2 07/21/2017 0941   MCH 28.4 08/24/2013 1633   MCHC 33.2 07/21/2017 0941   MCHC 32.4 03/21/2017 0934   RDW 14.8 07/21/2017 0941   LYMPHSABS 1.6 07/21/2017 0941   MONOABS 0.3 03/21/2017 0934   EOSABS 0.1 07/21/2017 0941   BASOSABS 0.0 07/21/2017 0941   Iron/TIBC/Ferritin/ %Sat No results found for: IRON, TIBC, FERRITIN, IRONPCTSAT Lipid Panel     Component Value Date/Time   CHOL 159 07/21/2017 0941   TRIG 59 07/21/2017 0941   HDL 67 07/21/2017 0941   CHOLHDL 3 03/21/2017 0934   VLDL 17.6 03/21/2017 0934   LDLCALC 80 07/21/2017 0941   Hepatic Function Panel     Component Value Date/Time   PROT 7.0 11/02/2017 0000   ALBUMIN 4.1 11/02/2017 0000   AST 21 11/02/2017 0000   ALT 22 11/02/2017 0000   ALKPHOS 130 (H) 11/02/2017 0000   BILITOT 0.4 11/02/2017 0000   BILIDIR 0.1 10/10/2014 1133   IBILI 0.3 08/24/2013 1633      Component Value Date/Time   TSH 1.530 07/21/2017 0941   TSH 2.12 03/21/2017 0934   TSH 1.54 10/30/2015 1144      OBESITY BEHAVIORAL INTERVENTION VISIT  Today's visit was # 10   Starting weight: 216 lbs Starting date: 07/21/17 Today's weight : 205 lbs  Today's date: 03/30/2018 Total lbs lost to date: 11    ASK: We discussed the diagnosis of obesity with Kenney Houseman M Tingley today and Rana agreed to give [place] permission to discuss obesity behavioral modification therapy  today.  ASSESS: Ruth [name] has the diagnosis of obesity and her  BMI today is 34.11 Ruth [name] is in the action stage of change   ADVISE: Ruth [name] was educated on the multiple health risks of obesity as well as the benefit of weight loss to improve her health. She was advised of the need for long term treatment and the importance of lifestyle modifications to improve her current health and to decrease her risk of future health problems.  AGREE: Multiple dietary modification options and treatment options were discussed and  Ruth [name] agreed to follow the recommendations documented in the above note.  ARRANGE: Lenyx was educated on the importance of frequent visits to treat obesity as outlined per CMS and USPSTF guidelines and agreed to schedule her next follow up appointment today.  I, Trixie Dredge, am acting as transcriptionist for Ilene Qua, MD  I have reviewed the above documentation for accuracy and completeness, and I agree with the above. - Ilene Qua, MD

## 2018-04-10 ENCOUNTER — Other Ambulatory Visit: Payer: Self-pay | Admitting: Family Medicine

## 2018-04-13 ENCOUNTER — Encounter (INDEPENDENT_AMBULATORY_CARE_PROVIDER_SITE_OTHER): Payer: Self-pay | Admitting: Family Medicine

## 2018-04-13 ENCOUNTER — Ambulatory Visit (INDEPENDENT_AMBULATORY_CARE_PROVIDER_SITE_OTHER): Payer: 59 | Admitting: Family Medicine

## 2018-04-13 VITALS — BP 123/84 | HR 91 | Temp 98.6°F | Ht 65.0 in | Wt 207.0 lb

## 2018-04-13 DIAGNOSIS — E559 Vitamin D deficiency, unspecified: Secondary | ICD-10-CM | POA: Diagnosis not present

## 2018-04-13 DIAGNOSIS — Z6834 Body mass index (BMI) 34.0-34.9, adult: Secondary | ICD-10-CM

## 2018-04-13 DIAGNOSIS — E669 Obesity, unspecified: Secondary | ICD-10-CM

## 2018-04-13 DIAGNOSIS — Z9189 Other specified personal risk factors, not elsewhere classified: Secondary | ICD-10-CM

## 2018-04-13 DIAGNOSIS — R7303 Prediabetes: Secondary | ICD-10-CM | POA: Diagnosis not present

## 2018-04-13 MED ORDER — VITAMIN D (ERGOCALCIFEROL) 1.25 MG (50000 UNIT) PO CAPS: 50000.0000 [IU] | ORAL_CAPSULE | ORAL | 0 refills | Status: DC

## 2018-04-14 LAB — LIPID PANEL WITH LDL/HDL RATIO
Cholesterol, Total: 158 mg/dL (ref 100–199)
HDL: 76 mg/dL (ref >39)
LDL Calculated: 72 mg/dL (ref 0–99)
LDl/HDL Ratio: 0.9 ratio (ref 0.0–3.2)
Triglycerides: 51 mg/dL (ref 0–149)
VLDL CHOLESTEROL CAL: 10 mg/dL (ref 5–40)

## 2018-04-14 LAB — COMPREHENSIVE METABOLIC PANEL
ALT: 16 IU/L (ref 0–32)
AST: 15 IU/L (ref 0–40)
Albumin/Globulin Ratio: 1.1 — ABNORMAL LOW (ref 1.2–2.2)
Albumin: 3.9 g/dL (ref 3.5–5.5)
Alkaline Phosphatase: 139 IU/L — ABNORMAL HIGH (ref 39–117)
BUN/Creatinine Ratio: 13 (ref 9–23)
BUN: 11 mg/dL (ref 6–24)
Bilirubin Total: 0.4 mg/dL (ref 0.0–1.2)
CO2: 22 mmol/L (ref 20–29)
Calcium: 9.3 mg/dL (ref 8.7–10.2)
Chloride: 103 mmol/L (ref 96–106)
Creatinine, Ser: 0.84 mg/dL (ref 0.57–1.00)
GFR calc non Af Amer: 78 mL/min/{1.73_m2} (ref >59)
GFR, EST AFRICAN AMERICAN: 90 mL/min/{1.73_m2} (ref >59)
GLUCOSE: 88 mg/dL (ref 65–99)
Globulin, Total: 3.7 g/dL (ref 1.5–4.5)
Potassium: 4.1 mmol/L (ref 3.5–5.2)
Sodium: 143 mmol/L (ref 134–144)
Total Protein: 7.6 g/dL (ref 6.0–8.5)

## 2018-04-14 LAB — CBC WITH DIFFERENTIAL/PLATELET
Basophils Absolute: 0.1 10*3/uL (ref 0.0–0.2)
Basos: 1 %
EOS (ABSOLUTE): 0.2 10*3/uL (ref 0.0–0.4)
Eos: 3 %
Hematocrit: 43.1 % (ref 34.0–46.6)
Hemoglobin: 14.2 g/dL (ref 11.1–15.9)
IMMATURE GRANS (ABS): 0 10*3/uL (ref 0.0–0.1)
Immature Granulocytes: 0 %
Lymphocytes Absolute: 1.9 10*3/uL (ref 0.7–3.1)
Lymphs: 37 %
MCH: 27.9 pg (ref 26.6–33.0)
MCHC: 32.9 g/dL (ref 31.5–35.7)
MCV: 85 fL (ref 79–97)
Monocytes Absolute: 0.2 10*3/uL (ref 0.1–0.9)
Monocytes: 5 %
Neutrophils Absolute: 2.9 10*3/uL (ref 1.4–7.0)
Neutrophils: 54 %
Platelets: 235 10*3/uL (ref 150–450)
RBC: 5.09 x10E6/uL (ref 3.77–5.28)
RDW: 13.6 % (ref 11.7–15.4)
WBC: 5.3 10*3/uL (ref 3.4–10.8)

## 2018-04-14 LAB — T4, FREE: Free T4: 1.28 ng/dL (ref 0.82–1.77)

## 2018-04-14 LAB — HEMOGLOBIN A1C
Est. average glucose Bld gHb Est-mCnc: 111 mg/dL
Hgb A1c MFr Bld: 5.5 % (ref 4.8–5.6)

## 2018-04-14 LAB — INSULIN, RANDOM: INSULIN: 12.8 u[IU]/mL (ref 2.6–24.9)

## 2018-04-14 LAB — TSH: TSH: 1.72 u[IU]/mL (ref 0.450–4.500)

## 2018-04-14 LAB — VITAMIN D 25 HYDROXY (VIT D DEFICIENCY, FRACTURES): Vit D, 25-Hydroxy: 40.8 ng/mL (ref 30.0–100.0)

## 2018-04-14 LAB — T3: T3, Total: 105 ng/dL (ref 71–180)

## 2018-04-14 NOTE — Progress Notes (Signed)
Office: [phone]  /  Fax: [phone]   HPI:   Chief Complaint: OBESITY Ruth [name] is here to discuss her progress with her obesity treatment plan. She is on the keep a food journal with 1300-1400 calories and 75+ grams of protein daily and is following her eating plan approximately 60 % of the time. She states she is exercising 0 minutes 0 times per week. Ruth [name] got injured at work last week so she hasn't been able to exercise. She is still struggling to get back on track but is doing better than previous. She has a celebration next Saturday.  Her weight is 207 lb (93.9 kg) today and has gained 2 pounds since her last visit. She has lost 9 lbs since starting treatment with [place].  Vitamin D Deficiency Ruth [name] has a diagnosis of vitamin D deficiency. She is currently taking prescription Vit D. She notes fatigue and denies nausea, vomiting or muscle weakness.  At risk for osteopenia and osteoporosis Ruth [name] is at higher risk of osteopenia and osteoporosis due to vitamin D deficiency.   Pre-Diabetes Ruth [name] has a diagnosis of pre-diabetes based on her elevated Hgb A1c and was informed this puts her at greater risk of developing diabetes. She notes occasional carbohydrate cravings and she is not on any medications. She continues to work on diet and exercise to decrease risk of diabetes. She denies nausea or hypoglycemia.  ASSESSMENT AND PLAN:  Vitamin D deficiency - Plan: VITAMIN D 25 Hydroxy (Vit-D Deficiency, Fractures), Vitamin D, Ergocalciferol, (DRISDOL) 1.25 MG (50000 UT) CAPS capsule  Prediabetes - Plan: Comprehensive metabolic panel, Hemoglobin A1c, Insulin, random  At risk for osteoporosis  Class 1 obesity with serious comorbidity and body mass index (BMI) of 34.0 to 34.9 in adult, unspecified obesity type  PLAN:  Vitamin D Deficiency Ruth [name] was informed that low vitamin D levels contributes to fatigue and are associated with obesity, breast, and colon cancer. Ruth [name] agrees to continue  taking prescription Vit D @50 ,000 IU every week #4 and we will refill for 1 month. She will follow up for routine testing of vitamin D, at least 2-3 times per year. She was informed of the risk of over-replacement of vitamin D and agrees to not increase her dose unless she discusses this with [place] first. We will check Vit D level today. Ruth [name] agrees to follow up with our clinic in 2 weeks.  At risk for osteopenia and osteoporosis Ruth [name] was given extended (15 minutes) osteoporosis prevention counseling today. Ruth [name] is at risk for osteopenia and osteoporsis due to her vitamin D deficiency. She was encouraged to take her vitamin D and follow her higher calcium diet and increase strengthening exercise to help strengthen her bones and decrease her risk of osteopenia and osteoporosis.  Pre-Diabetes Ruth [name] will continue to work on weight loss, exercise, and decreasing simple carbohydrates in her diet to help decrease the risk of diabetes. We dicussed metformin including benefits and risks. She was informed that eating too many simple carbohydrates or too many calories at one sitting increases the likelihood of GI side effects. Ruth [name] declined metformin for now and a prescription was not written today. We will check CMP, Hgb A1c, and insulin today. Ruth [name] agrees to follow up with our clinic in 2 weeks as directed to monitor her progress.  Obesity Ruth [name] is currently in the action stage of change. As such, her goal is to continue with weight loss efforts She has agreed to keep a food journal with 1300-1400 calories and 75+ grams of  protein daily Ruth [name] has been instructed to work up to a goal of 150 minutes of combined cardio and strengthening exercise per week for weight loss and overall health benefits. We discussed the following Behavioral Modification Strategies today: increasing lean protein intake, increasing vegetables, work on meal planning and easy cooking plans, planning for success, and keep a strict food  journal   Ruth [name] has agreed to follow up with our clinic in 2 weeks. She was informed of the importance of frequent follow up visits to maximize her success with intensive lifestyle modifications for her multiple health conditions.  ALLERGIES: Allergies  Allergen Reactions  . Other     Nuts     MEDICATIONS: Current Outpatient Medications on File Prior to Visit  Medication Sig Dispense Refill  . albuterol (PROAIR HFA) 108 (90 Base) MCG/ACT inhaler Inhale 2 puffs into the lungs every 6 (six) hours as needed. 18 g 12  . amLODipine (NORVASC) 5 MG tablet Take 5 mg by mouth daily.    . cetirizine (ZYRTEC) 10 MG chewable tablet Chew 1 tablet (10 mg total) by mouth daily. As needed. 90 tablet 1  . EPINEPHrine (EPI-PEN) 0.3 mg/0.3 mL SOAJ injection Inject into upper thigh for severe allergic reaction 1 Device prn  . fluticasone (FLONASE) 50 MCG/ACT nasal spray 2 SPRAYS EACH NOSTRIL EVERY DAY 16 g 2  . omeprazole (PRILOSEC) 40 MG capsule Take 1 capsule (40 mg total) by mouth daily. 30 capsule 3  . valACYclovir (VALTREX) 1000 MG tablet Take 1 tablet (1,000 mg total) by mouth 3 (three) times daily. 30 tablet 3  . desonide (DESOWEN) 0.05 % lotion APPLY TO AFFECTED AREA TWICE DAILY 59 mL 2   No current facility-administered medications on file prior to visit.     PAST MEDICAL HISTORY: Past Medical History:  Diagnosis Date  . Allergic rhinitis, cause unspecified   . Asthma   . Eczema   . Food allergy   . Gallbladder problem   . Headache   . Hypertension     PAST SURGICAL HISTORY: Past Surgical History:  Procedure Laterality Date  . CESAREAN SECTION    . CESAREAN SECTION Bilateral [id number]    SOCIAL HISTORY: Social History   Tobacco Use  . Smoking status: Never Smoker  . Smokeless tobacco: Never Used  Substance Use Topics  . Alcohol use: Yes    Alcohol/week: 0.0 standard drinks    Comment: one glass of wine per week/fim  . Drug use: No    FAMILY HISTORY: Family History    Problem Relation Age of Onset  . Asthma Mother   . Allergies Mother   . Asthma Father   . Drug abuse Father   . Allergies Father     ROS: Review of Systems  Constitutional: Positive for malaise/fatigue. Negative for weight loss.  Gastrointestinal: Negative for nausea and vomiting.  Musculoskeletal:       Negative muscle weakness  Endo/Heme/Allergies:       Negative hypoglycemia    PHYSICAL EXAM: Blood pressure 123/84, pulse 91, temperature 98.6 F (37 C), temperature source Oral, height 5\' 5"  (1.651 m), weight 207 lb (93.9 kg), last menstrual period 09/19/2015, SpO2 98 %. Body mass index is 34.45 kg/m. Physical Exam Vitals signs reviewed.  Constitutional:      Appearance: Normal appearance. She is obese.  Cardiovascular:     Rate and Rhythm: Normal rate.     Pulses: Normal pulses.  Pulmonary:     Effort: Pulmonary effort is normal.  Breath sounds: Normal breath sounds.  Musculoskeletal: Normal range of motion.  Skin:    General: Skin is warm and dry.  Neurological:     Mental Status: She is alert and oriented to person, place, and time.  Psychiatric:        Mood and Affect: Mood normal.        Behavior: Behavior normal.     RECENT LABS AND TESTS: BMET    Component Value Date/Time   NA 143 04/13/2018 1001   K 4.1 04/13/2018 1001   CL 103 04/13/2018 1001   CO2 22 04/13/2018 1001   GLUCOSE 88 04/13/2018 1001   GLUCOSE 89 04/18/2017 0936   BUN 11 04/13/2018 1001   CREATININE 0.84 04/13/2018 1001   CREATININE 0.82 08/24/2013 1633   CALCIUM 9.3 04/13/2018 1001   GFRNONAA 78 04/13/2018 1001   GFRAA 90 04/13/2018 1001   Lab Results  Component Value Date   HGBA1C 5.5 04/13/2018   HGBA1C 5.7 (H) 11/02/2017   HGBA1C 5.7 (H) 07/21/2017   Lab Results  Component Value Date   INSULIN 12.8 04/13/2018   INSULIN 6.2 11/02/2017   INSULIN 15.0 07/21/2017   CBC    Component Value Date/Time   WBC 5.3 04/13/2018 1001   WBC 5.5 03/21/2017 0934   RBC 5.09  04/13/2018 1001   RBC 4.96 03/21/2017 0934   HGB 14.2 04/13/2018 1001   HCT 43.1 04/13/2018 1001   PLT 235 04/13/2018 1001   MCV 85 04/13/2018 1001   MCH 27.9 04/13/2018 1001   MCH 28.4 08/24/2013 1633   MCHC 32.9 04/13/2018 1001   MCHC 32.4 03/21/2017 0934   RDW 13.6 04/13/2018 1001   LYMPHSABS 1.9 04/13/2018 1001   MONOABS 0.3 03/21/2017 0934   EOSABS 0.2 04/13/2018 1001   BASOSABS 0.1 04/13/2018 1001   Iron/TIBC/Ferritin/ %Sat No results found for: IRON, TIBC, FERRITIN, IRONPCTSAT Lipid Panel     Component Value Date/Time   CHOL 158 04/13/2018 1001   TRIG 51 04/13/2018 1001   HDL 76 04/13/2018 1001   CHOLHDL 3 03/21/2017 0934   VLDL 17.6 03/21/2017 0934   LDLCALC 72 04/13/2018 1001   Hepatic Function Panel     Component Value Date/Time   PROT 7.6 04/13/2018 1001   ALBUMIN 3.9 04/13/2018 1001   AST 15 04/13/2018 1001   ALT 16 04/13/2018 1001   ALKPHOS 139 (H) 04/13/2018 1001   BILITOT 0.4 04/13/2018 1001   BILIDIR 0.1 10/10/2014 1133   IBILI 0.3 08/24/2013 1633      Component Value Date/Time   TSH 1.720 04/13/2018 1001   TSH 1.530 07/21/2017 0941   TSH 2.12 03/21/2017 0934      OBESITY BEHAVIORAL INTERVENTION VISIT  Today's visit was # 11   Starting weight: 216 lbs Starting date: 07/21/17 Today's weight : 207 lbs  Today's date: 04/13/2018 Total lbs lost to date: 9    ASK: We discussed the diagnosis of obesity with Ruth [name] today and Ruth [name] agreed to give [place] permission to discuss obesity behavioral modification therapy today.  ASSESS: Emmalin has the diagnosis of obesity and her BMI today is 34.45 Myeisha is in the action stage of change   ADVISE: Zaydee was educated on the multiple health risks of obesity as well as the benefit of weight loss to improve her health. She was advised of the need for long term treatment and the importance of lifestyle modifications to improve her current health and to decrease her risk of future health  problems.  AGREE: Multiple dietary modification options and treatment options were discussed and  Briyanna agreed to follow the recommendations documented in the above note.  ARRANGE: Marshall was educated on the importance of frequent visits to treat obesity as outlined per CMS and USPSTF guidelines and agreed to schedule her next follow up appointment today.  I, Trixie Dredge, am acting as transcriptionist for Ruth Qua, MD  I have reviewed the above documentation for accuracy and completeness, and I agree with the above. - Ruth Qua, MD

## 2018-04-27 ENCOUNTER — Encounter (INDEPENDENT_AMBULATORY_CARE_PROVIDER_SITE_OTHER): Payer: Self-pay

## 2018-04-27 ENCOUNTER — Ambulatory Visit (INDEPENDENT_AMBULATORY_CARE_PROVIDER_SITE_OTHER): Payer: 59 | Admitting: Family Medicine

## 2018-05-08 ENCOUNTER — Encounter: Payer: Self-pay | Admitting: Neurology

## 2018-05-15 ENCOUNTER — Other Ambulatory Visit: Payer: Self-pay | Admitting: Family Medicine

## 2018-05-15 ENCOUNTER — Encounter (INDEPENDENT_AMBULATORY_CARE_PROVIDER_SITE_OTHER): Payer: Self-pay | Admitting: Family Medicine

## 2018-05-15 ENCOUNTER — Ambulatory Visit (INDEPENDENT_AMBULATORY_CARE_PROVIDER_SITE_OTHER): Payer: 59 | Admitting: Family Medicine

## 2018-05-15 VITALS — BP 125/82 | HR 78 | Temp 97.7°F | Ht 65.0 in | Wt 209.0 lb

## 2018-05-15 DIAGNOSIS — Z6834 Body mass index (BMI) 34.0-34.9, adult: Secondary | ICD-10-CM | POA: Diagnosis not present

## 2018-05-15 DIAGNOSIS — E669 Obesity, unspecified: Secondary | ICD-10-CM

## 2018-05-15 DIAGNOSIS — E559 Vitamin D deficiency, unspecified: Secondary | ICD-10-CM

## 2018-05-15 DIAGNOSIS — Z9189 Other specified personal risk factors, not elsewhere classified: Secondary | ICD-10-CM

## 2018-05-15 MED ORDER — VITAMIN D (ERGOCALCIFEROL) 1.25 MG (50000 UNIT) PO CAPS: 50000.0000 [IU] | ORAL_CAPSULE | ORAL | 0 refills | Status: DC

## 2018-05-15 NOTE — Progress Notes (Addendum)
Office: [phone]  /  Fax: [phone]   HPI:   Chief Complaint: OBESITY Ruth [name] is here to discuss her progress with her obesity treatment plan. She is on the  keep a food journal with 1300 to 1400 calories and 75+ grams of protein and is following her eating plan approximately 0 % of the time. She states she is exercising 0 minutes 0 times per week. Ruth [name] has not been concentrating on weight loss while getting ready to change jobs. She anticipates more routine with regular hours and decreased stress, which will help with meal planning, etc.  Her weight is 209 lb (94.8 kg) today and has had a weight gain of 2 pounds over a period of 4 weeks since her last visit. She has lost 7 lbs since starting treatment with [place].  Vitamin D deficiency Ruth [name] has a diagnosis of vitamin D deficiency. She is currently stable on vit D, but is not yet at goal. She denies nausea, vomiting, or muscle weakness.  At risk for osteopenia and osteoporosis Ruth [name] is at higher risk of osteopenia and osteoporosis due to vitamin D deficiency.   ASSESSMENT AND PLAN:  Vitamin D deficiency - Plan: Vitamin D, Ergocalciferol, (DRISDOL) 1.25 MG (50000 UT) CAPS capsule  At risk for osteoporosis  Class 1 obesity with serious comorbidity and body mass index (BMI) of 34.0 to 34.9 in adult, unspecified obesity type  PLAN:  Vitamin D Deficiency Ruth [name] was informed that low vitamin D levels contributes to fatigue and are associated with obesity, breast, and colon cancer. Merci agrees to continue to take prescription Vit D @50 ,000 IU every week #4 with no refills and will follow up for routine testing of vitamin D, at least 2-3 times per year. She was informed of the risk of over-replacement of vitamin D and agrees to not increase her dose unless she discusses this with [place] first. We will recheck labs in 3 months and Ruth [name] agrees to follow up in 3 weeks as directed.  At risk for osteopenia and osteoporosis Ruth [name] was given  extended (15 minutes) osteoporosis prevention counseling today. Ruth [name] is at risk for osteopenia and osteoporosis due to her vitamin D deficiency. She was encouraged to take her vitamin D and follow her higher calcium diet and increase strengthening exercise to help strengthen her bones and decrease her risk of osteopenia and osteoporosis.  Obesity Ruth [name] is currently in the action stage of change. As such, her goal is to continue with weight loss efforts. She has agreed to keep a food journal with 1300 to 1400 calories and 75+ grams of protein.  Ruth [name] has been instructed to work up to a goal of 150 minutes of combined cardio and strengthening exercise per week for weight loss and overall health benefits. We discussed the following Behavioral Modification Strategies today: increasing lean protein intake, decreasing simple carbohydrates, and work on meal planning and easy cooking plans.  Ruth [name] has agreed to follow up with our clinic in 3 weeks. She was informed of the importance of frequent follow up visits to maximize her success with intensive lifestyle modifications for her multiple health conditions.  ALLERGIES: Allergies  Allergen Reactions  . Other     Nuts     MEDICATIONS: Current Outpatient Medications on File Prior to Visit  Medication Sig Dispense Refill  . albuterol (PROAIR HFA) 108 (90 Base) MCG/ACT inhaler Inhale 2 puffs into the lungs every 6 (six) hours as needed. 18 g 12  . amLODipine (NORVASC) 5 MG tablet Take 5  mg by mouth daily.    . cetirizine (ZYRTEC) 10 MG chewable tablet Chew 1 tablet (10 mg total) by mouth daily. As needed. 90 tablet 1  . desonide (DESOWEN) 0.05 % lotion APPLY TO AFFECTED AREA TWICE DAILY 59 mL 2  . EPINEPHrine (EPI-PEN) 0.3 mg/0.3 mL SOAJ injection Inject into upper thigh for severe allergic reaction 1 Device prn  . fluticasone (FLONASE) 50 MCG/ACT nasal spray 2 SPRAYS EACH NOSTRIL EVERY DAY 16 g 2  . omeprazole (PRILOSEC) 40 MG capsule Take 1 capsule  (40 mg total) by mouth daily. 30 capsule 3  . valACYclovir (VALTREX) 1000 MG tablet Take 1 tablet (1,000 mg total) by mouth 3 (three) times daily. 30 tablet 3   No current facility-administered medications on file prior to visit.     PAST MEDICAL HISTORY: Past Medical History:  Diagnosis Date  . Allergic rhinitis, cause unspecified   . Asthma   . Eczema   . Food allergy   . Gallbladder problem   . Headache   . Hypertension     PAST SURGICAL HISTORY: Past Surgical History:  Procedure Laterality Date  . CESAREAN SECTION    . CESAREAN SECTION Bilateral [id number]    SOCIAL HISTORY: Social History   Tobacco Use  . Smoking status: Never Smoker  . Smokeless tobacco: Never Used  Substance Use Topics  . Alcohol use: Yes    Alcohol/week: 0.0 standard drinks    Comment: one glass of wine per week/fim  . Drug use: No    FAMILY HISTORY: Family History  Problem Relation Age of Onset  . Asthma Mother   . Allergies Mother   . Asthma Father   . Drug abuse Father   . Allergies Father     ROS: Review of Systems  Constitutional: Negative for weight loss.  Gastrointestinal: Negative for nausea and vomiting.  Musculoskeletal:       Negative for muscle weakness.    PHYSICAL EXAM: Blood pressure 125/82, pulse 78, temperature 97.7 F (36.5 C), temperature source Oral, height 5\' 5"  (1.651 m), weight 209 lb (94.8 kg), last menstrual period 09/19/2015, SpO2 99 %. Body mass index is 34.78 kg/m. Physical Exam Vitals signs reviewed.  Constitutional:      Appearance: Normal appearance. She is obese.  Cardiovascular:     Rate and Rhythm: Normal rate.  Pulmonary:     Effort: Pulmonary effort is normal.  Musculoskeletal: Normal range of motion.  Skin:    General: Skin is warm and dry.  Neurological:     Mental Status: She is alert and oriented to person, place, and time.  Psychiatric:        Mood and Affect: Mood normal.        Behavior: Behavior normal.     RECENT LABS  AND TESTS: BMET    Component Value Date/Time   NA 143 04/13/2018 1001   K 4.1 04/13/2018 1001   CL 103 04/13/2018 1001   CO2 22 04/13/2018 1001   GLUCOSE 88 04/13/2018 1001   GLUCOSE 89 04/18/2017 0936   BUN 11 04/13/2018 1001   CREATININE 0.84 04/13/2018 1001   CREATININE 0.82 08/24/2013 1633   CALCIUM 9.3 04/13/2018 1001   GFRNONAA 78 04/13/2018 1001   GFRAA 90 04/13/2018 1001   Lab Results  Component Value Date   HGBA1C 5.5 04/13/2018   HGBA1C 5.7 (H) 11/02/2017   HGBA1C 5.7 (H) 07/21/2017   Lab Results  Component Value Date   INSULIN 12.8 04/13/2018   INSULIN 6.2  11/02/2017   INSULIN 15.0 07/21/2017   CBC    Component Value Date/Time   WBC 5.3 04/13/2018 1001   WBC 5.5 03/21/2017 0934   RBC 5.09 04/13/2018 1001   RBC 4.96 03/21/2017 0934   HGB 14.2 04/13/2018 1001   HCT 43.1 04/13/2018 1001   PLT 235 04/13/2018 1001   MCV 85 04/13/2018 1001   MCH 27.9 04/13/2018 1001   MCH 28.4 08/24/2013 1633   MCHC 32.9 04/13/2018 1001   MCHC 32.4 03/21/2017 0934   RDW 13.6 04/13/2018 1001   LYMPHSABS 1.9 04/13/2018 1001   MONOABS 0.3 03/21/2017 0934   EOSABS 0.2 04/13/2018 1001   BASOSABS 0.1 04/13/2018 1001   Iron/TIBC/Ferritin/ %Sat No results found for: IRON, TIBC, FERRITIN, IRONPCTSAT Lipid Panel     Component Value Date/Time   CHOL 158 04/13/2018 1001   TRIG 51 04/13/2018 1001   HDL 76 04/13/2018 1001   CHOLHDL 3 03/21/2017 0934   VLDL 17.6 03/21/2017 0934   LDLCALC 72 04/13/2018 1001   Hepatic Function Panel     Component Value Date/Time   PROT 7.6 04/13/2018 1001   ALBUMIN 3.9 04/13/2018 1001   AST 15 04/13/2018 1001   ALT 16 04/13/2018 1001   ALKPHOS 139 (H) 04/13/2018 1001   BILITOT 0.4 04/13/2018 1001   BILIDIR 0.1 10/10/2014 1133   IBILI 0.3 08/24/2013 1633      Component Value Date/Time   TSH 1.720 04/13/2018 1001   TSH 1.530 07/21/2017 0941   TSH 2.12 03/21/2017 0934   Results for CATINA, NUSS (MRN [id number]) as of 05/15/2018  12:32  Ref. Range 04/13/2018 10:01  Vitamin D, 25-Hydroxy Latest Ref Range: 30.0 - 100.0 ng/mL 40.8   OBESITY BEHAVIORAL INTERVENTION VISIT  Today's visit was # 12   Starting weight: 216 lbs Starting date: 07/21/17 Today's weight : Weight: 209 lb (94.8 kg)  Today's date: 05/15/2018 Total lbs lost to date: 7    Most Recent Value 05/19/2013 - 05/18/2018  Height 5\' 5"  (1.651 m) 05/15/2018  Weight 209 lb (94.8 kg) 05/15/2018  BMI (Calculated) 34.78 05/15/2018  BLOOD PRESSURE - SYSTOLIC 465 0/35/4656  BLOOD PRESSURE - DIASTOLIC 82 11/08/7515  Waist Measurement  42 inches 07/21/2017   Body Fat % 42.5 % 05/15/2018  Total Body Water (lbs) 76.6 lbs 05/15/2018  RMR 1678 07/21/2017   ASK: We discussed the diagnosis of obesity with Allean M Zappia today and Freedom agreed to give [place] permission to discuss obesity behavioral modification therapy today.  ASSESS: Sharifah has the diagnosis of obesity and her BMI today is 34.7. Shontel is in the action stage of change.   ADVISE: Cherisa was educated on the multiple health risks of obesity as well as the benefit of weight loss to improve her health. She was advised of the need for long term treatment and the importance of lifestyle modifications to improve her current health and to decrease her risk of future health problems.  AGREE: Multiple dietary modification options and treatment options were discussed and Hajira agreed to follow the recommendations documented in the above note.  ARRANGE: Jessilyn was educated on the importance of frequent visits to treat obesity as outlined per CMS and USPSTF guidelines and agreed to schedule her next follow up appointment today.  IMarcille Blanco, CMA, am acting as transcriptionist for Starlyn Skeans, MD    I have reviewed the above documentation for accuracy and completeness, and I agree with the above. -Dennard Nip, MD

## 2018-05-16 NOTE — Telephone Encounter (Signed)
Dr Carollee Herter:  Please advise amlodipine request?  It looks like last rx by [place] was 2018 in med history.

## 2018-05-16 NOTE — Telephone Encounter (Signed)
She needs ov with [place]---  Ok to refill until ov

## 2018-05-25 ENCOUNTER — Encounter

## 2018-05-25 ENCOUNTER — Ambulatory Visit: Payer: 59 | Admitting: Neurology

## 2018-06-05 ENCOUNTER — Ambulatory Visit (INDEPENDENT_AMBULATORY_CARE_PROVIDER_SITE_OTHER): Payer: 59 | Admitting: Family Medicine

## 2018-06-16 ENCOUNTER — Other Ambulatory Visit: Payer: Self-pay | Admitting: Family Medicine

## 2018-06-22 ENCOUNTER — Encounter (INDEPENDENT_AMBULATORY_CARE_PROVIDER_SITE_OTHER): Payer: Self-pay

## 2018-06-27 ENCOUNTER — Telehealth: Payer: Self-pay | Admitting: *Deleted

## 2018-06-27 NOTE — Telephone Encounter (Signed)
Called, LVM for pt to call office back about her appt tomorrow with Dr. Felecia Shelling at 11:30am.   LVM advising that due to current COVID-19 pandemic, our office is severely reducing in office visits for at least the next 2 weeks, in order to minimize the risk to our patients and healthcare providers. We would like to transition her to a virtual visit. She can use smart phone or computer with video capability. Advised it will be billed to insurance like an office visit would.  I will need to update her chart info when she calls and get updated email.

## 2018-06-28 ENCOUNTER — Ambulatory Visit: Payer: 59 | Admitting: Neurology

## 2018-06-29 ENCOUNTER — Ambulatory Visit (INDEPENDENT_AMBULATORY_CARE_PROVIDER_SITE_OTHER): Payer: BLUE CROSS/BLUE SHIELD | Admitting: Family Medicine

## 2018-06-29 ENCOUNTER — Other Ambulatory Visit: Payer: Self-pay

## 2018-06-29 ENCOUNTER — Encounter (INDEPENDENT_AMBULATORY_CARE_PROVIDER_SITE_OTHER): Payer: Self-pay | Admitting: Family Medicine

## 2018-06-29 DIAGNOSIS — F3289 Other specified depressive episodes: Secondary | ICD-10-CM

## 2018-06-29 DIAGNOSIS — E559 Vitamin D deficiency, unspecified: Secondary | ICD-10-CM | POA: Diagnosis not present

## 2018-06-29 DIAGNOSIS — E669 Obesity, unspecified: Secondary | ICD-10-CM | POA: Diagnosis not present

## 2018-06-29 DIAGNOSIS — Z6834 Body mass index (BMI) 34.0-34.9, adult: Secondary | ICD-10-CM | POA: Diagnosis not present

## 2018-06-29 MED ORDER — VITAMIN D (ERGOCALCIFEROL) 1.25 MG (50000 UNIT) PO CAPS: 50000.0000 [IU] | ORAL_CAPSULE | ORAL | 0 refills | Status: DC

## 2018-07-03 ENCOUNTER — Encounter (INDEPENDENT_AMBULATORY_CARE_PROVIDER_SITE_OTHER): Payer: Self-pay | Admitting: Family Medicine

## 2018-07-03 ENCOUNTER — Ambulatory Visit (INDEPENDENT_AMBULATORY_CARE_PROVIDER_SITE_OTHER): Payer: Self-pay | Admitting: Family Medicine

## 2018-07-03 DIAGNOSIS — E66812 Obesity, class 2: Secondary | ICD-10-CM | POA: Insufficient documentation

## 2018-07-03 DIAGNOSIS — Z6834 Body mass index (BMI) 34.0-34.9, adult: Secondary | ICD-10-CM

## 2018-07-03 DIAGNOSIS — F329 Major depressive disorder, single episode, unspecified: Secondary | ICD-10-CM | POA: Insufficient documentation

## 2018-07-03 DIAGNOSIS — E559 Vitamin D deficiency, unspecified: Secondary | ICD-10-CM | POA: Insufficient documentation

## 2018-07-03 DIAGNOSIS — E669 Obesity, unspecified: Secondary | ICD-10-CM | POA: Insufficient documentation

## 2018-07-03 DIAGNOSIS — F32A Depression, unspecified: Secondary | ICD-10-CM | POA: Insufficient documentation

## 2018-07-03 NOTE — Progress Notes (Signed)
Office: [phone]  /  Fax: [phone] TeleHealth Visit:  Ruth [name] has verbally consented to this TeleHealth visit today. The patient is located at home, the provider is located at the [employer] [facility]. The participants in this visit include the listed provider and patient. The visit was conducted today via FaceTime.  HPI:   Chief Complaint: OBESITY Ruth [name] is here to discuss her progress with her obesity treatment plan. She is keeping a food journal with 1300-1400 calories and 75+ grams of protein and is following her eating plan approximately 50% of the time. She states she is doing Zumba 30-45 minutes 4 times per week. Ruth [name] states she is struggling with working at home and reports she is not journaling as much since she has been at home. She does state she is meeting her protein goal. She reports weighing 214 lbs on her home scale today. 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  We were unable to weigh the patient today for this TeleHealth visit. She feels as if she has gained weight since her last visit. She has lost 7 lbs since starting treatment with [place].  Vitamin D deficiency Ruth [name] has a diagnosis of Vitamin D deficiency, which is not at goal. Her last Vitamin D level was reported to be 40.8 on 04/13/2018. She is currently taking prescription Vit D and denies nausea, vomiting or muscle weakness.  Depression with emotional eating behaviors Ruth [name] is struggling with stress eating recently especially at night and using food for comfort to the extent that it is negatively impacting her health. She often snacks when she is not hungry. Ruth [name] sometimes feels she is out of control and then feels guilty that she made poor food choices. She has been working on behavior modification techniques to help reduce her emotional eating and has been somewhat successful. She shows no sign of suicidal or homicidal ideations. We did discuss Wellbutrin but she  declined.  Depression screen [facility] 2/9 07/21/2017 03/21/2017 08/14/2012  Decreased Interest 0 0 0  Down, Depressed, Hopeless 1 0 0  PHQ - 2 Score 1 0 0  Altered sleeping 1 - -  Tired, decreased energy 0 - -  Change in appetite 3 - -  Feeling bad or failure about yourself  1 - -  Trouble concentrating 0 - -  Moving slowly or fidgety/restless 0 - -  Suicidal thoughts 0 - -  PHQ-9 Score 6 - -   ASSESSMENT AND PLAN:  Vitamin D deficiency - Plan: Vitamin D, Ergocalciferol, (DRISDOL) 1.25 MG (50000 UT) CAPS capsule  Other depression - with emotional eating  Class 1 obesity with serious comorbidity and body mass index (BMI) of 34.0 to 34.9 in adult, unspecified obesity type  PLAN:  Vitamin D Deficiency Ruth [name] was informed that low Vitamin D levels contributes to fatigue and are associated with obesity, breast, and colon cancer. She agrees to continue to take prescription Vit D @ 50,000 IU every week #4 with 0 refills and will follow-up for routine testing of Vitamin D, at least 2-3 times per year. She was informed of the risk of over-replacement of Vitamin D and agrees to not increase her dose unless she discusses this with [place] first. Ruth [name] agrees to follow-up with our clinic in 2-3 weeks.  Depression with Emotional Eating Behaviors We discussed behavior modification techniques today to help Ruth [name] deal with her emotional eating and depression. She has been advised to substitute healthier snacks. We did  discuss Wellbutrin but she declined.  Obesity Ruth [name] is currently in the action stage of change. As such, her goal is to continue with weight loss efforts. She has agreed to keep a food journal with 1300-1400 calories and 75 grams of protein daily. Ruth [name] has been instructed to continue doing Zumba 30-45 minutes 4 times per week. We discussed the following Behavioral Modification Strategies today: keeping healthy foods in the home, better snacking choices, emotional eating strategies, and planning  for success.  Ruth [name] has agreed to follow-up with our clinic in 2-3 weeks. She was informed of the importance of frequent follow-up visits to maximize her success with intensive lifestyle modifications for her multiple health conditions.  ALLERGIES: Allergies  Allergen Reactions  . Other     Nuts     MEDICATIONS: Current Outpatient Medications on File Prior to Visit  Medication Sig Dispense Refill  . albuterol (PROAIR HFA) 108 (90 Base) MCG/ACT inhaler Inhale 2 puffs into the lungs every 6 (six) hours as needed. 18 g 12  . amLODipine (NORVASC) 5 MG tablet TAKE 1 TABLET BY MOUTH EVERY DAY 30 tablet 2  . cetirizine (ZYRTEC) 10 MG chewable tablet Chew 1 tablet (10 mg total) by mouth daily. As needed. 90 tablet 1  . desonide (DESOWEN) 0.05 % lotion APPLY TO AFFECTED AREA TWICE DAILY 59 mL 2  . EPINEPHrine (EPI-PEN) 0.3 mg/0.3 mL SOAJ injection Inject into upper thigh for severe allergic reaction 1 Device prn  . fluticasone (FLONASE) 50 MCG/ACT nasal spray 2 SPRAYS EACH NOSTRIL EVERY DAY 16 g 2  . omeprazole (PRILOSEC) 40 MG capsule Take 1 capsule (40 mg total) by mouth daily. 30 capsule 3  . valACYclovir (VALTREX) 1000 MG tablet Take 1 tablet (1,000 mg total) by mouth 3 (three) times daily. 30 tablet 3   No current facility-administered medications on file prior to visit.     PAST MEDICAL HISTORY: Past Medical History:  Diagnosis Date  . Allergic rhinitis, cause unspecified   . Asthma   . Eczema   . Food allergy   . Gallbladder problem   . Headache   . Hypertension     PAST SURGICAL HISTORY: Past Surgical History:  Procedure Laterality Date  . CESAREAN SECTION    . CESAREAN SECTION Bilateral [id number]    SOCIAL HISTORY: Social History   Tobacco Use  . Smoking status: Never Smoker  . Smokeless tobacco: Never Used  Substance Use Topics  . Alcohol use: Yes    Alcohol/week: 0.0 standard drinks    Comment: one glass of wine per week/fim  . Drug use: No    FAMILY  HISTORY: Family History  Problem Relation Age of Onset  . Asthma Mother   . Allergies Mother   . Asthma Father   . Drug abuse Father   . Allergies Father    ROS: Review of Systems  Gastrointestinal: Negative for nausea and vomiting.  Musculoskeletal:       Negative for muscle weakness.  Psychiatric/Behavioral: Positive for depression (emotional eating).   PHYSICAL EXAM: Pt in no acute distress  RECENT LABS AND TESTS: BMET    Component Value Date/Time   NA 143 04/13/2018 1001   K 4.1 04/13/2018 1001   CL 103 04/13/2018 1001   CO2 22 04/13/2018 1001   GLUCOSE 88 04/13/2018 1001   GLUCOSE 89 04/18/2017 0936   BUN 11 04/13/2018 1001   CREATININE 0.84 04/13/2018 1001   CREATININE 0.82 08/24/2013 1633   CALCIUM 9.3 04/13/2018 1001   GFRNONAA  78 04/13/2018 1001   GFRAA 90 04/13/2018 1001   Lab Results  Component Value Date   HGBA1C 5.5 04/13/2018   HGBA1C 5.7 (H) 11/02/2017   HGBA1C 5.7 (H) 07/21/2017   Lab Results  Component Value Date   INSULIN 12.8 04/13/2018   INSULIN 6.2 11/02/2017   INSULIN 15.0 07/21/2017   CBC    Component Value Date/Time   WBC 5.3 04/13/2018 1001   WBC 5.5 03/21/2017 0934   RBC 5.09 04/13/2018 1001   RBC 4.96 03/21/2017 0934   HGB 14.2 04/13/2018 1001   HCT 43.1 04/13/2018 1001   PLT 235 04/13/2018 1001   MCV 85 04/13/2018 1001   MCH 27.9 04/13/2018 1001   MCH 28.4 08/24/2013 1633   MCHC 32.9 04/13/2018 1001   MCHC 32.4 03/21/2017 0934   RDW 13.6 04/13/2018 1001   LYMPHSABS 1.9 04/13/2018 1001   MONOABS 0.3 03/21/2017 0934   EOSABS 0.2 04/13/2018 1001   BASOSABS 0.1 04/13/2018 1001   Iron/TIBC/Ferritin/ %Sat No results found for: IRON, TIBC, FERRITIN, IRONPCTSAT Lipid Panel     Component Value Date/Time   CHOL 158 04/13/2018 1001   TRIG 51 04/13/2018 1001   HDL 76 04/13/2018 1001   CHOLHDL 3 03/21/2017 0934   VLDL 17.6 03/21/2017 0934   LDLCALC 72 04/13/2018 1001   Hepatic Function Panel     Component Value  Date/Time   PROT 7.6 04/13/2018 1001   ALBUMIN 3.9 04/13/2018 1001   AST 15 04/13/2018 1001   ALT 16 04/13/2018 1001   ALKPHOS 139 (H) 04/13/2018 1001   BILITOT 0.4 04/13/2018 1001   BILIDIR 0.1 10/10/2014 1133   IBILI 0.3 08/24/2013 1633      Component Value Date/Time   TSH 1.720 04/13/2018 1001   TSH 1.530 07/21/2017 0941   TSH 2.12 03/21/2017 0934   Results for MELE, SYLVESTER (MRN [id number]) as of 07/03/2018 08:42  Ref. Range 04/13/2018 10:01  Vitamin D, 25-Hydroxy Latest Ref Range: 30.0 - 100.0 ng/mL 40.8    I, Michaelene Song, am acting as Location manager for [employer], FNP-C.  I have reviewed the above documentation for accuracy and completeness, and I agree with the above.  - Mireyah Chervenak, FNP-C.

## 2018-07-04 ENCOUNTER — Encounter: Payer: Self-pay | Admitting: Neurology

## 2018-07-04 ENCOUNTER — Ambulatory Visit (INDEPENDENT_AMBULATORY_CARE_PROVIDER_SITE_OTHER): Payer: BLUE CROSS/BLUE SHIELD | Admitting: Neurology

## 2018-07-04 ENCOUNTER — Encounter

## 2018-07-04 ENCOUNTER — Other Ambulatory Visit: Payer: Self-pay

## 2018-07-04 DIAGNOSIS — G43009 Migraine without aura, not intractable, without status migrainosus: Secondary | ICD-10-CM | POA: Diagnosis not present

## 2018-07-04 DIAGNOSIS — I671 Cerebral aneurysm, nonruptured: Secondary | ICD-10-CM | POA: Diagnosis not present

## 2018-07-04 DIAGNOSIS — I10 Essential (primary) hypertension: Secondary | ICD-10-CM

## 2018-07-04 DIAGNOSIS — R9082 White matter disease, unspecified: Secondary | ICD-10-CM

## 2018-07-04 NOTE — Progress Notes (Signed)
[facility]  PATIENT: Ruth [name] DOB: 1960/06/15  REFERRING DOCTOR OR PCP:  Garnet Koyanagi SOURCE: patient and records and MRI images on PACS  _________________________________   HISTORICAL  CHIEF COMPLAINT:  No chief complaint on file.   HISTORY OF PRESENT ILLNESS:  Ruth [name] is a 58 yo woman with headaches, AComm aneurysm and white matter changes on MRI.  Update 07/04/2018: Virtual Visit via Video Note  I connected with Ruth [name] on 07/04/18 at  8:30 AM EDT by a video enabled telemedicine application and verified that I am speaking with the correct person using two identifiers.   I discussed the limitations of evaluation and management by telemedicine and the availability of in person appointments. The patient expressed understanding and agreed to proceed.  History of Present Illness: She feels she is doing well.  She has not had many headaches.   Her BP has done well and she is only on Norvasc 5 mg now.    She does not have any side effects and no lightheadedness.  She notes losing 17 pounds over the last year which is helped her blood pressure.  She also feels better in general.  She has not had the floaters much in the left eye as she did last year.  Neurologically, gait, strength, sensation and coordination are doing well.  I personally reviewed the MRI reports and CTA reports from 2016 and 2017 and also looked at the images.  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  The white matter changes are much more consistent with chronic microvascular ischemic change than with demyelination.  Her last aneurysm imaging study is 2017 and we discussed the need to repeat sometime this year.  Due to the COVID-19 crisis we will put this off a couple months in the hope that imaging will be safer at that time.   Observations/Objective: She is a well-developed well-nourished woman in no acute distress.  The head is normocephalic and  atraumatic.  Visible skin appears normal.  She is alert and fully oriented with fluent speech and good attention, focus and memory.  Extraocular muscles appear normal.  Facial strength and trapezius strength was normal.  Arm strength appeared normal and rapid alternating movements were performed well.  Finger-nose-finger was performed well.  Sensation, leg strength, gait and reflexes could not be evaluated today.  Assessment and Plan: Aneurysm, cerebral, nonruptured  Migraine without aura and without status migrainosus, not intractable  Essential hypertension  White matter abnormality on MRI of brain  1.   In order to determine if the aneurysm is stable, we will repeat an MRA in a couple months.  Hopefully this will be safer at that time. 2.   Continue amlodipine 5 mg.  If she loses more weight and blood pressure drops, we could consider stopping this. 3.   She will return to see me in 1 year or sooner if new or worsening symptoms.  Follow Up Instructions: I discussed the assessment and treatment plan with the patient. The patient was provided an opportunity to ask questions and all were answered. The patient agreed with the plan and demonstrated an understanding of the instructions.    The patient was advised to call back or seek an in-person evaluation if the symptoms worsen or if the condition fails to improve as anticipated.  I provided 20 minutes of non-face-to-face time during this encounter.   ____________________________________ From previous notes Update 02/21/2017:  She feels mostly stable since the last visit though the left eye has more light sensitivity.   Vision is occ blurry out of that eye but not always.    She has had floaters in that eye.    Headaches are doing well.    Recent headaches seemed allergy related and she took Zyrtec.   She is having a HA once a week, usually across her forehead.   Left is usually worse than right.     Moving and bright lights worsen the pain  and resting and Zyrtec help.      Her HTN is doing ok.    She needed to switch from valsartan to losartan.     She has not had many more dizzy spells. She does feel her gait is a little off balance at times.  RI and MRA were reviewed. She has some white matter changes, mostly in the subcortical white matter. She also has a small lacune in the left cerebellar hemisphere that appears chronic.   MRA shows a small 2 mm outpouching (left Ant comm. Art.) that could be an enlarged infundibulum or a small aneurysm.   From 02/18/2016:     Abnl MRI and aneurysm: MRI 03/11/2015 showed mild chronic microvascular ischemic changes, mostly subcortical.  MR angiogram 04/04/2015 showed a 2 mm outpouching anterior communicating artery region that could be an aneurysm or an infundibulum.   CT angiogram was then performed showing a 1.5 mm ACA aneurysm with a narrow neck.  HA:   She is only getting rare headaches now.    She has had only one bad headache since starting Diovan.  She took a Goody's and it improved.   She often gets a duller pressure like headache that she feels are due to her sinuses.    The moderate headache was right sided and throbbing with nausea, photophobia.  She generally does not get visual or other aura before the headache. A typical headache will last about half a day and then resolve.    Advil or goodys will usually knock it out.      HTN:  She is doing much better on Diovan and last BP was 126/78.    Dizziness:   She had one episode of dizziness last month while on a stool.  This one lasted a few minutes only.   Over the past year she has had a couple episodes of dizziness lasting about 2 hours. During that time she feels off balanced. The vertigo was more translational than spinning. There was not lightheadedness.    Vascular risks:   She has HTN, now better controlled.   She does not have diabetes. She does not smoke.   She was never a smoker as she has asthma.   She denies any diagnosis  heart problem. She has felt that her heart skips a beat at times.    REVIEW OF SYSTEMS: Constitutional: No fevers, chills, sweats, or change in appetite Eyes: No visual changes, double vision, eye pain Ear, nose and throat: No hearing loss, ear pain, nasal congestion, sore throat Cardiovascular: No chest pain, palpitations Respiratory: No shortness of breath at rest or with exertion.   No wheezes GastrointestinaI: No nausea, vomiting, diarrhea, abdominal pain, fecal incontinence Genitourinary: No dysuria, urinary retention or frequency.  No nocturia. Musculoskeletal: No neck pain, back pain Integumentary: No rash, pruritus, skin lesions Neurological: as above Psychiatric: No depression at this time.  No anxiety Endocrine: No palpitations, diaphoresis, change in appetite, change in  weigh or increased thirst Hematologic/Lymphatic: No anemia, purpura, petechiae. Allergic/Immunologic: No itchy/runny eyes, nasal congestion, recent allergic reactions, rashes  ALLERGIES: Allergies  Allergen Reactions  . Other     Nuts     HOME MEDICATIONS:  Current Outpatient Medications:  .  albuterol (PROAIR HFA) 108 (90 Base) MCG/ACT inhaler, Inhale 2 puffs into the lungs every 6 (six) hours as needed., Disp: 18 g, Rfl: 12 .  amLODipine (NORVASC) 5 MG tablet, TAKE 1 TABLET BY MOUTH EVERY DAY, Disp: 30 tablet, Rfl: 2 .  cetirizine (ZYRTEC) 10 MG chewable tablet, Chew 1 tablet (10 mg total) by mouth daily. As needed., Disp: 90 tablet, Rfl: 1 .  desonide (DESOWEN) 0.05 % lotion, APPLY TO AFFECTED AREA TWICE DAILY, Disp: 59 mL, Rfl: 2 .  EPINEPHrine (EPI-PEN) 0.3 mg/0.3 mL SOAJ injection, Inject into upper thigh for severe allergic reaction, Disp: 1 Device, Rfl: prn .  fluticasone (FLONASE) 50 MCG/ACT nasal spray, 2 SPRAYS EACH NOSTRIL EVERY DAY, Disp: 16 g, Rfl: 2 .  omeprazole (PRILOSEC) 40 MG capsule, Take 1 capsule (40 mg total) by mouth daily., Disp: 30 capsule, Rfl: 3 .  valACYclovir (VALTREX)  1000 MG tablet, Take 1 tablet (1,000 mg total) by mouth 3 (three) times daily., Disp: 30 tablet, Rfl: 3 .  Vitamin D, Ergocalciferol, (DRISDOL) 1.25 MG (50000 UT) CAPS capsule, Take 1 capsule (50,000 Units total) by mouth every 7 (seven) days., Disp: 4 capsule, Rfl: 0  PAST MEDICAL HISTORY: Past Medical History:  Diagnosis Date  . Allergic rhinitis, cause unspecified   . Asthma   . Eczema   . Food allergy   . Gallbladder problem   . Headache   . Hypertension     PAST SURGICAL HISTORY: Past Surgical History:  Procedure Laterality Date  . CESAREAN SECTION    . CESAREAN SECTION Bilateral [id number]    FAMILY HISTORY: Family History  Problem Relation Age of Onset  . Asthma Mother   . Allergies Mother   . Asthma Father   . Drug abuse Father   . Allergies Father     SOCIAL HISTORY:  Social History   Socioeconomic History  . Marital status: Married    Spouse name: Not on file  . Number of children: Not on file  . Years of education: Not on file  . Highest education level: Not on file  Occupational History  . Occupation: Scientist, forensic    Comment: guard   Social Needs  . Financial resource strain: Not on file  . Food insecurity:    Worry: Not on file    Inability: Not on file  . Transportation needs:    Medical: Not on file    Non-medical: Not on file  Tobacco Use  . Smoking status: Never Smoker  . Smokeless tobacco: Never Used  Substance and Sexual Activity  . Alcohol use: Yes    Alcohol/week: 0.0 standard drinks    Comment: one glass of wine per week/fim  . Drug use: No  . Sexual activity: Yes    Partners: Male  Lifestyle  . Physical activity:    Days per week: Not on file    Minutes per session: Not on file  . Stress: Not on file  Relationships  . Social connections:    Talks on phone: Not on file    Gets together: Not on file    Attends religious service: Not on file    Active member of club or organization: Not on file    Attends  meetings of  clubs or organizations: Not on file    Relationship status: Not on file  . Intimate partner violence:    Fear of current or ex partner: Not on file    Emotionally abused: Not on file    Physically abused: Not on file    Forced sexual activity: Not on file  Other Topics Concern  . Not on file  Social History Narrative   Exercise--no     PHYSICAL EXAM  There were no vitals filed for this visit.  There is no height or weight on file to calculate BMI.   General: The patient is well-developed and well-nourished and in no acute distress.  The neck and head are nontender.    Neurologic Exam  Mental status: The patient is alert and oriented x 3 at the time of the examination. The patient has apparent normal recent and remote memory, with an apparently normal attention span and concentration ability.   Speech is normal.  Cranial nerves: Extraocular movements are full.  Facial strength and sensation is normal. Trapezius strength is strong. There is no dysarthria.  The tongue is midline, and the patient has symmetric elevation of the soft palate. No obvious hearing deficits are noted.  Motor:  Muscle bulk is normal.   Tone is normal. Strength is  5 / 5 in all 4 extremities.   Sensory: Sensory testing is intact to touch sensation in all 4 extremities.  Coordination: Cerebellar testing reveals good finger-nose-finger and heel-to-shin bilaterally.  Gait and station: Station is normal.   Gait is normal. The tandem gait is mildly wide.. Romberg is negative.   Reflexes: Deep tendon reflexes are symmetric and normal bilaterally.       DIAGNOSTIC DATA (LABS, IMAGING, TESTING) - I reviewed patient records, labs, notes, testing and imaging myself where available.  Lab Results  Component Value Date   WBC 5.3 04/13/2018   HGB 14.2 04/13/2018   HCT 43.1 04/13/2018   MCV 85 04/13/2018   PLT 235 04/13/2018      Component Value Date/Time   NA 143 04/13/2018 1001   K 4.1 04/13/2018 1001    CL 103 04/13/2018 1001   CO2 22 04/13/2018 1001   GLUCOSE 88 04/13/2018 1001   GLUCOSE 89 04/18/2017 0936   BUN 11 04/13/2018 1001   CREATININE 0.84 04/13/2018 1001   CREATININE 0.82 08/24/2013 1633   CALCIUM 9.3 04/13/2018 1001   PROT 7.6 04/13/2018 1001   ALBUMIN 3.9 04/13/2018 1001   AST 15 04/13/2018 1001   ALT 16 04/13/2018 1001   ALKPHOS 139 (H) 04/13/2018 1001   BILITOT 0.4 04/13/2018 1001   GFRNONAA 78 04/13/2018 1001   GFRAA 90 04/13/2018 1001   Lab Results  Component Value Date   CHOL 158 04/13/2018   HDL 76 04/13/2018   LDLCALC 72 04/13/2018   TRIG 51 04/13/2018   CHOLHDL 3 03/21/2017   Lab Results  Component Value Date   HGBA1C 5.5 04/13/2018   Lab Results  Component Value Date   TXMIWOEH21 224 07/21/2017   Lab Results  Component Value Date   TSH 1.720 04/13/2018      Kimiya Brunelle A. Felecia Shelling, MD, PhD 10/28/5001, 7:04 AM Certified in Neurology, Clinical Neurophysiology, Sleep Medicine, Pain Medicine and Neuroimaging  [facility] [address] [place], [place] [address] [phone]

## 2018-07-20 ENCOUNTER — Ambulatory Visit (INDEPENDENT_AMBULATORY_CARE_PROVIDER_SITE_OTHER): Payer: Self-pay | Admitting: Family Medicine

## 2018-07-26 ENCOUNTER — Other Ambulatory Visit: Payer: Self-pay

## 2018-07-26 ENCOUNTER — Ambulatory Visit (INDEPENDENT_AMBULATORY_CARE_PROVIDER_SITE_OTHER): Payer: BLUE CROSS/BLUE SHIELD | Admitting: Family Medicine

## 2018-07-26 DIAGNOSIS — E669 Obesity, unspecified: Secondary | ICD-10-CM | POA: Diagnosis not present

## 2018-07-26 DIAGNOSIS — R7303 Prediabetes: Secondary | ICD-10-CM | POA: Diagnosis not present

## 2018-07-26 DIAGNOSIS — E559 Vitamin D deficiency, unspecified: Secondary | ICD-10-CM

## 2018-07-26 DIAGNOSIS — Z6834 Body mass index (BMI) 34.0-34.9, adult: Secondary | ICD-10-CM

## 2018-07-26 MED ORDER — VITAMIN D (ERGOCALCIFEROL) 1.25 MG (50000 UNIT) PO CAPS: 50000.0000 [IU] | ORAL_CAPSULE | ORAL | 0 refills | Status: DC

## 2018-07-26 NOTE — Progress Notes (Signed)
Office: [phone]  /  Fax: [phone] TeleHealth Visit:  Ruth [name] has verbally consented to this TeleHealth visit today. The patient is located at home, the provider is located at the [facility] office. The participants in this visit include the listed provider and patient. The visit was conducted today via webex.  HPI:   Chief Complaint: OBESITY Ruth [name] is here to discuss her progress with her obesity treatment plan. She is on the keep a food journal with 1300-1400 calories and 75 grams of protein daily and is following her eating plan approximately 70 % of the time. She states she is walking for 30-45 minutes 2 times per week. [name] has had a great deal of stress in the past few weeks. She had to IVC her daughter and started working from home. 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  We were unable to weigh the patient today for this TeleHealth visit. She feels as if she has gained 6 lbs since her last visit. She has lost 7 lbs since starting treatment with [place].  Vitamin D Deficiency Ruth [name] has a diagnosis of vitamin D deficiency. She is currently taking prescription Vit D. She notes fatigue and denies nausea, vomiting or muscle weakness.  Pre-Diabetes Ruth [name] has a diagnosis of pre-diabetes based on her elevated Hgb A1c and was informed this puts her at greater risk of developing diabetes. She is not taking metformin currently and notes carbohydrate cravings. She continues to work on diet and exercise to decrease risk of diabetes. She denies nausea or hypoglycemia.  ASSESSMENT AND PLAN:  Vitamin D deficiency - Plan: Vitamin D, Ergocalciferol, (DRISDOL) 1.25 MG (50000 UT) CAPS capsule  Prediabetes  Class 1 obesity with serious comorbidity and body mass index (BMI) of 34.0 to 34.9 in adult, unspecified obesity type  PLAN:  Vitamin D Deficiency Ruth [name] was informed that low vitamin D levels contributes to fatigue and are  associated with obesity, breast, and colon cancer. Ruth [name] agrees to continue taking prescription Vit D @50 ,000 IU every week #4 and we will refill for 1 month. She will follow up for routine testing of vitamin D, at least 2-3 times per year. She was informed of the risk of over-replacement of vitamin D and agrees to not increase her dose unless she discusses this with [place] first. [name] agrees to follow up with our clinic in 2 weeks.  Pre-Diabetes Ruth [name] will continue to work on weight loss, exercise, and decreasing simple carbohydrates in her diet to help decrease the risk of diabetes. We dicussed metformin including benefits and risks. She was informed that eating too many simple carbohydrates or too many calories at one sitting increases the likelihood of GI side effects. Ruth [name] declined metformin for now and a prescription was not written today. We will repeat labs at next appointment. Ruth [name] agrees to follow up with our clinic in 2 weeks as directed to monitor her progress.  Obesity [name] is currently in the action stage of change. As such, her goal is to continue with weight loss efforts She has agreed to keep a food journal with 1300-1400 calories and 75+ grams of protein daily Ruth [name] has been instructed to work up to a goal of 150 minutes of combined cardio and strengthening exercise per week for weight loss and overall health benefits. We discussed the following Behavioral Modification Strategies today: increasing lean protein intake, increasing vegetables, decrease eating out, work on meal planning and easy cooking  plans, emotional eating strategies, ways to avoid boredom eating, and planning for success   Ruth [name] has agreed to follow up with our clinic in 2 weeks. She was informed of the importance of frequent follow up visits to maximize her success with intensive lifestyle modifications for her multiple health conditions.  ALLERGIES: Allergies  Allergen Reactions  . Other     Nuts      MEDICATIONS: Current Outpatient Medications on File Prior to Visit  Medication Sig Dispense Refill  . albuterol (PROAIR HFA) 108 (90 Base) MCG/ACT inhaler Inhale 2 puffs into the lungs every 6 (six) hours as needed. 18 g 12  . amLODipine (NORVASC) 5 MG tablet TAKE 1 TABLET BY MOUTH EVERY DAY 30 tablet 2  . cetirizine (ZYRTEC) 10 MG chewable tablet Chew 1 tablet (10 mg total) by mouth daily. As needed. 90 tablet 1  . desonide (DESOWEN) 0.05 % lotion APPLY TO AFFECTED AREA TWICE DAILY 59 mL 2  . EPINEPHrine (EPI-PEN) 0.3 mg/0.3 mL SOAJ injection Inject into upper thigh for severe allergic reaction 1 Device prn  . fluticasone (FLONASE) 50 MCG/ACT nasal spray 2 SPRAYS EACH NOSTRIL EVERY DAY 16 g 2  . omeprazole (PRILOSEC) 40 MG capsule Take 1 capsule (40 mg total) by mouth daily. 30 capsule 3  . valACYclovir (VALTREX) 1000 MG tablet Take 1 tablet (1,000 mg total) by mouth 3 (three) times daily. 30 tablet 3   No current facility-administered medications on file prior to visit.     PAST MEDICAL HISTORY: Past Medical History:  Diagnosis Date  . Allergic rhinitis, cause unspecified   . Asthma   . Eczema   . Food allergy   . Gallbladder problem   . Headache   . Hypertension     PAST SURGICAL HISTORY: Past Surgical History:  Procedure Laterality Date  . CESAREAN SECTION    . CESAREAN SECTION Bilateral [id number]    SOCIAL HISTORY: Social History   Tobacco Use  . Smoking status: Never Smoker  . Smokeless tobacco: Never Used  Substance Use Topics  . Alcohol use: Yes    Alcohol/week: 0.0 standard drinks    Comment: one glass of wine per week/fim  . Drug use: No    FAMILY HISTORY: Family History  Problem Relation Age of Onset  . Asthma Mother   . Allergies Mother   . Asthma Father   . Drug abuse Father   . Allergies Father     ROS: Review of Systems  Constitutional: Positive for malaise/fatigue. Negative for weight loss.  Gastrointestinal: Negative for nausea and  vomiting.  Musculoskeletal:       Negative muscle weakness  Endo/Heme/Allergies:       Negative hypoglycemia    PHYSICAL EXAM: Pt in no acute distress  RECENT LABS AND TESTS: BMET    Component Value Date/Time   NA 143 04/13/2018 1001   K 4.1 04/13/2018 1001   CL 103 04/13/2018 1001   CO2 22 04/13/2018 1001   GLUCOSE 88 04/13/2018 1001   GLUCOSE 89 04/18/2017 0936   BUN 11 04/13/2018 1001   CREATININE 0.84 04/13/2018 1001   CREATININE 0.82 08/24/2013 1633   CALCIUM 9.3 04/13/2018 1001   GFRNONAA 78 04/13/2018 1001   GFRAA 90 04/13/2018 1001   Lab Results  Component Value Date   HGBA1C 5.5 04/13/2018   HGBA1C 5.7 (H) 11/02/2017   HGBA1C 5.7 (H) 07/21/2017   Lab Results  Component Value Date   INSULIN 12.8 04/13/2018   INSULIN 6.2 11/02/2017  INSULIN 15.0 07/21/2017   CBC    Component Value Date/Time   WBC 5.3 04/13/2018 1001   WBC 5.5 03/21/2017 0934   RBC 5.09 04/13/2018 1001   RBC 4.96 03/21/2017 0934   HGB 14.2 04/13/2018 1001   HCT 43.1 04/13/2018 1001   PLT 235 04/13/2018 1001   MCV 85 04/13/2018 1001   MCH 27.9 04/13/2018 1001   MCH 28.4 08/24/2013 1633   MCHC 32.9 04/13/2018 1001   MCHC 32.4 03/21/2017 0934   RDW 13.6 04/13/2018 1001   LYMPHSABS 1.9 04/13/2018 1001   MONOABS 0.3 03/21/2017 0934   EOSABS 0.2 04/13/2018 1001   BASOSABS 0.1 04/13/2018 1001   Iron/TIBC/Ferritin/ %Sat No results found for: IRON, TIBC, FERRITIN, IRONPCTSAT Lipid Panel     Component Value Date/Time   CHOL 158 04/13/2018 1001   TRIG 51 04/13/2018 1001   HDL 76 04/13/2018 1001   CHOLHDL 3 03/21/2017 0934   VLDL 17.6 03/21/2017 0934   LDLCALC 72 04/13/2018 1001   Hepatic Function Panel     Component Value Date/Time   PROT 7.6 04/13/2018 1001   ALBUMIN 3.9 04/13/2018 1001   AST 15 04/13/2018 1001   ALT 16 04/13/2018 1001   ALKPHOS 139 (H) 04/13/2018 1001   BILITOT 0.4 04/13/2018 1001   BILIDIR 0.1 10/10/2014 1133   IBILI 0.3 08/24/2013 1633       Component Value Date/Time   TSH 1.720 04/13/2018 1001   TSH 1.530 07/21/2017 0941   TSH 2.12 03/21/2017 0934      I, Trixie Dredge, am acting as transcriptionist for Ilene Qua, MD  I have reviewed the above documentation for accuracy and completeness, and I agree with the above. - Ilene Qua, MD

## 2018-07-31 ENCOUNTER — Encounter (INDEPENDENT_AMBULATORY_CARE_PROVIDER_SITE_OTHER): Payer: Self-pay | Admitting: Family Medicine

## 2018-07-31 ENCOUNTER — Other Ambulatory Visit: Payer: Self-pay | Admitting: Obstetrics and Gynecology

## 2018-08-01 DIAGNOSIS — Z01419 Encounter for gynecological examination (general) (routine) without abnormal findings: Secondary | ICD-10-CM | POA: Diagnosis not present

## 2018-08-01 DIAGNOSIS — Z1151 Encounter for screening for human papillomavirus (HPV): Secondary | ICD-10-CM | POA: Diagnosis not present

## 2018-08-01 DIAGNOSIS — Z6841 Body Mass Index (BMI) 40.0 and over, adult: Secondary | ICD-10-CM | POA: Diagnosis not present

## 2018-08-01 DIAGNOSIS — B373 Candidiasis of vulva and vagina: Secondary | ICD-10-CM | POA: Diagnosis not present

## 2018-08-01 DIAGNOSIS — N898 Other specified noninflammatory disorders of vagina: Secondary | ICD-10-CM | POA: Diagnosis not present

## 2018-08-01 DIAGNOSIS — Z113 Encounter for screening for infections with a predominantly sexual mode of transmission: Secondary | ICD-10-CM | POA: Diagnosis not present

## 2018-08-09 ENCOUNTER — Ambulatory Visit (INDEPENDENT_AMBULATORY_CARE_PROVIDER_SITE_OTHER): Payer: BLUE CROSS/BLUE SHIELD | Admitting: Family Medicine

## 2018-08-09 ENCOUNTER — Encounter (INDEPENDENT_AMBULATORY_CARE_PROVIDER_SITE_OTHER): Payer: Self-pay | Admitting: Family Medicine

## 2018-08-09 ENCOUNTER — Other Ambulatory Visit: Payer: Self-pay

## 2018-08-09 DIAGNOSIS — E559 Vitamin D deficiency, unspecified: Secondary | ICD-10-CM

## 2018-08-09 DIAGNOSIS — E669 Obesity, unspecified: Secondary | ICD-10-CM

## 2018-08-09 DIAGNOSIS — I10 Essential (primary) hypertension: Secondary | ICD-10-CM | POA: Diagnosis not present

## 2018-08-09 DIAGNOSIS — Z6834 Body mass index (BMI) 34.0-34.9, adult: Secondary | ICD-10-CM | POA: Diagnosis not present

## 2018-08-09 NOTE — Progress Notes (Signed)
Office: [phone]  /  Fax: [phone] TeleHealth Visit:  Ruth [name] has verbally consented to this TeleHealth visit today. The patient is located at home, the provider is located at the [facility] office. The participants in this visit include the listed provider and patient. The visit was conducted today via face time.  HPI:   Chief Complaint: OBESITY Ruth [name] is here to discuss her progress with her obesity treatment plan. She is on the keep a food journal with 1300-1400 calories and 75+ grams of protein daily and is following her eating plan approximately 50 % of the time. She states she is walking for 45 minutes 3 times per week. Ruth [name] had a better 2 weeks, and got a compact refrigerator. So she has been able to store food and cook. She has been journaling half of the days, but being mindful of what she is choosing to eat. She is only getting in around half of the protein goal. She has been skipping breakfast.  We were unable to weigh the patient today for this TeleHealth visit. She feels as if she has lost 2 lbs since her last visit. She has lost 7-9 lbs since starting treatment with [place].  Hypertension Ruth [name] is a 58 y.o. female with hypertension. Ruth [name]'s blood pressure was controlled previously in the office. She states her blood pressure at home is 126/78. She denies chest pain, chest pressure, or headaches. She is working on weight loss to help control her blood pressure with the goal of decreasing her risk of heart attack and stroke.   Vitamin D Deficiency Ruth [name] has a diagnosis of vitamin D deficiency. She is currently taking prescription Vit D. She notes fatigue and denies nausea, vomiting or muscle weakness.  ASSESSMENT AND PLAN:  Essential hypertension  Vitamin D deficiency  Class 1 obesity with serious comorbidity and body mass index (BMI) of 34.0 to 34.9 in adult, unspecified obesity type  PLAN:  Hypertension We discussed sodium  restriction, working on healthy weight loss, and a regular exercise program as the means to achieve improved blood pressure control. Marthann agreed with this plan and agreed to follow up as directed. We will continue to monitor her blood pressure as well as her progress with the above lifestyle modifications. Ruth [name] agrees to continue taking amlodipine and will watch for signs of hypotension as she continues her lifestyle modifications. Ruth [name] agrees to follow up with our clinic in 2 weeks.  Vitamin D Deficiency Ruth [name] was informed that low vitamin D levels contributes to fatigue and are associated with obesity, breast, and colon cancer. Ruth [name] agrees to continue taking prescription Vit D @50 ,000 IU every week, no refill needed. She will follow up for routine testing of vitamin D, at least 2-3 times per year. She was informed of the risk of over-replacement of vitamin D and agrees to not increase her dose unless she discusses this with [place] first. Ruth [name] agrees to follow up with our clinic in 2 weeks.  Obesity Ruth [name] is currently in the action stage of change. As such, her goal is to continue with weight loss efforts She has agreed to keep a food journal with 1300-1400 calories and 75+ grams of protein daily Ruth [name] has been instructed to work up to a goal of 150 minutes of combined cardio and strengthening exercise per week for weight loss and overall health benefits. We discussed the following Behavioral Modification Strategies today: increasing lean protein intake, increasing vegetables and work on meal planning and easy  cooking plans, keeping healthy foods in the home, and planning for success   Ruth [name] has agreed to follow up with our clinic in 2 weeks. She was informed of the importance of frequent follow up visits to maximize her success with intensive lifestyle modifications for her multiple health conditions.  ALLERGIES: Allergies  Allergen Reactions  . Other     Nuts     MEDICATIONS: Current  Outpatient Medications on File Prior to Visit  Medication Sig Dispense Refill  . albuterol (PROAIR HFA) 108 (90 Base) MCG/ACT inhaler Inhale 2 puffs into the lungs every 6 (six) hours as needed. 18 g 12  . amLODipine (NORVASC) 5 MG tablet TAKE 1 TABLET BY MOUTH EVERY DAY 30 tablet 2  . cetirizine (ZYRTEC) 10 MG chewable tablet Chew 1 tablet (10 mg total) by mouth daily. As needed. 90 tablet 1  . desonide (DESOWEN) 0.05 % lotion APPLY TO AFFECTED AREA TWICE DAILY 59 mL 2  . EPINEPHrine (EPI-PEN) 0.3 mg/0.3 mL SOAJ injection Inject into upper thigh for severe allergic reaction 1 Device prn  . fluticasone (FLONASE) 50 MCG/ACT nasal spray 2 SPRAYS EACH NOSTRIL EVERY DAY 16 g 2  . omeprazole (PRILOSEC) 40 MG capsule Take 1 capsule (40 mg total) by mouth daily. 30 capsule 3  . valACYclovir (VALTREX) 1000 MG tablet Take 1 tablet (1,000 mg total) by mouth 3 (three) times daily. 30 tablet 3  . Vitamin D, Ergocalciferol, (DRISDOL) 1.25 MG (50000 UT) CAPS capsule Take 1 capsule (50,000 Units total) by mouth every 7 (seven) days. 4 capsule 0   No current facility-administered medications on file prior to visit.     PAST MEDICAL HISTORY: Past Medical History:  Diagnosis Date  . Allergic rhinitis, cause unspecified   . Asthma   . Eczema   . Food allergy   . Gallbladder problem   . Headache   . Hypertension     PAST SURGICAL HISTORY: Past Surgical History:  Procedure Laterality Date  . CESAREAN SECTION    . CESAREAN SECTION Bilateral [id number]    SOCIAL HISTORY: Social History   Tobacco Use  . Smoking status: Never Smoker  . Smokeless tobacco: Never Used  Substance Use Topics  . Alcohol use: Yes    Alcohol/week: 0.0 standard drinks    Comment: one glass of wine per week/fim  . Drug use: No    FAMILY HISTORY: Family History  Problem Relation Age of Onset  . Asthma Mother   . Allergies Mother   . Asthma Father   . Drug abuse Father   . Allergies Father     ROS: Review of  Systems  Constitutional: Positive for malaise/fatigue and weight loss.  Cardiovascular: Negative for chest pain.       Negative chest pressure  Gastrointestinal: Negative for nausea and vomiting.  Musculoskeletal:       Negative muscle weakness  Neurological: Negative for headaches.    PHYSICAL EXAM: Pt in no acute distress  RECENT LABS AND TESTS: BMET    Component Value Date/Time   NA 143 04/13/2018 1001   K 4.1 04/13/2018 1001   CL 103 04/13/2018 1001   CO2 22 04/13/2018 1001   GLUCOSE 88 04/13/2018 1001   GLUCOSE 89 04/18/2017 0936   BUN 11 04/13/2018 1001   CREATININE 0.84 04/13/2018 1001   CREATININE 0.82 08/24/2013 1633   CALCIUM 9.3 04/13/2018 1001   GFRNONAA 78 04/13/2018 1001   GFRAA 90 04/13/2018 1001   Lab Results  Component Value Date  HGBA1C 5.5 04/13/2018   HGBA1C 5.7 (H) 11/02/2017   HGBA1C 5.7 (H) 07/21/2017   Lab Results  Component Value Date   INSULIN 12.8 04/13/2018   INSULIN 6.2 11/02/2017   INSULIN 15.0 07/21/2017   CBC    Component Value Date/Time   WBC 5.3 04/13/2018 1001   WBC 5.5 03/21/2017 0934   RBC 5.09 04/13/2018 1001   RBC 4.96 03/21/2017 0934   HGB 14.2 04/13/2018 1001   HCT 43.1 04/13/2018 1001   PLT 235 04/13/2018 1001   MCV 85 04/13/2018 1001   MCH 27.9 04/13/2018 1001   MCH 28.4 08/24/2013 1633   MCHC 32.9 04/13/2018 1001   MCHC 32.4 03/21/2017 0934   RDW 13.6 04/13/2018 1001   LYMPHSABS 1.9 04/13/2018 1001   MONOABS 0.3 03/21/2017 0934   EOSABS 0.2 04/13/2018 1001   BASOSABS 0.1 04/13/2018 1001   Iron/TIBC/Ferritin/ %Sat No results found for: IRON, TIBC, FERRITIN, IRONPCTSAT Lipid Panel     Component Value Date/Time   CHOL 158 04/13/2018 1001   TRIG 51 04/13/2018 1001   HDL 76 04/13/2018 1001   CHOLHDL 3 03/21/2017 0934   VLDL 17.6 03/21/2017 0934   LDLCALC 72 04/13/2018 1001   Hepatic Function Panel     Component Value Date/Time   PROT 7.6 04/13/2018 1001   ALBUMIN 3.9 04/13/2018 1001   AST 15  04/13/2018 1001   ALT 16 04/13/2018 1001   ALKPHOS 139 (H) 04/13/2018 1001   BILITOT 0.4 04/13/2018 1001   BILIDIR 0.1 10/10/2014 1133   IBILI 0.3 08/24/2013 1633      Component Value Date/Time   TSH 1.720 04/13/2018 1001   TSH 1.530 07/21/2017 0941   TSH 2.12 03/21/2017 0934      I, Trixie Dredge, am acting as transcriptionist for Ilene Qua, MD  I have reviewed the above documentation for accuracy and completeness, and I agree with the above. - Ilene Qua, MD

## 2018-08-18 DIAGNOSIS — Z1231 Encounter for screening mammogram for malignant neoplasm of breast: Secondary | ICD-10-CM | POA: Diagnosis not present

## 2018-08-24 ENCOUNTER — Encounter (INDEPENDENT_AMBULATORY_CARE_PROVIDER_SITE_OTHER): Payer: Self-pay | Admitting: Family Medicine

## 2018-08-24 ENCOUNTER — Ambulatory Visit (INDEPENDENT_AMBULATORY_CARE_PROVIDER_SITE_OTHER): Payer: BLUE CROSS/BLUE SHIELD | Admitting: Family Medicine

## 2018-08-24 ENCOUNTER — Other Ambulatory Visit: Payer: Self-pay

## 2018-08-24 DIAGNOSIS — E559 Vitamin D deficiency, unspecified: Secondary | ICD-10-CM

## 2018-08-24 DIAGNOSIS — E669 Obesity, unspecified: Secondary | ICD-10-CM

## 2018-08-24 DIAGNOSIS — R7303 Prediabetes: Secondary | ICD-10-CM

## 2018-08-24 DIAGNOSIS — Z6834 Body mass index (BMI) 34.0-34.9, adult: Secondary | ICD-10-CM | POA: Diagnosis not present

## 2018-08-24 MED ORDER — VITAMIN D (ERGOCALCIFEROL) 1.25 MG (50000 UNIT) PO CAPS: 50000.0000 [IU] | ORAL_CAPSULE | ORAL | 0 refills | Status: DC

## 2018-08-24 NOTE — Progress Notes (Signed)
Office: [phone]  /  Fax: [phone] TeleHealth Visit:  Ruth [name] has verbally consented to this TeleHealth visit today. The patient is located at home, the provider is located at the [facility] office. The participants in this visit include the listed provider and patient and any and all parties involved. The visit was conducted today via FaceTime.  HPI:   Chief Complaint: OBESITY Ruth [name] is here to discuss her progress with her obesity treatment plan. She is on the keep a food journal with 1300 to 1400 calories and 75 grams of protein daily plan and is following her eating plan approximately 10 % of the time. She states she is exercising 0 minutes 0 times per week. Ruth [name] has a weight of 217 pounds today. She voices that journaling has been difficult secondary to her new workflow. She thinks the category plan would be easier to follow, because it takes away having to think daily about how to plan her day. We were unable to weigh the patient today for this TeleHealth visit. She feels as if she has maintained weight since her last visit. She has gained 1 lb since starting treatment with [place].  Vitamin D deficiency Ruth [name] has a diagnosis of vitamin D deficiency. Glynn is currently taking vit D. Ruth [name] admits fatigue and denies nausea, vomiting or muscle weakness.  Pre-Diabetes Ruth [name] has a diagnosis of prediabetes based on her elevated Hgb A1c and was informed this puts her at greater risk of developing diabetes. She is not taking metformin currently and continues to work on diet and exercise to decrease risk of diabetes. She admits to occasional carb cravings.  ASSESSMENT AND PLAN:  Vitamin D deficiency - Plan: Vitamin D, Ergocalciferol, (DRISDOL) 1.25 MG (50000 UT) CAPS capsule  Prediabetes  Class 1 obesity with serious comorbidity and body mass index (BMI) of 34.0 to 34.9 in adult, unspecified obesity type  PLAN:  Vitamin D Deficiency Ruth [name] was informed that low  vitamin D levels contributes to fatigue and are associated with obesity, breast, and colon cancer. She agrees to continue to take prescription Vit D @50 ,000 IU every week #4 with no refills and will follow up for routine testing of vitamin D, at least 2-3 times per year. She was informed of the risk of over-replacement of vitamin D and agrees to not increase her dose unless she discusses this with [place] first. Koula agrees to follow up as directed.  Pre-Diabetes Ruth [name] will continue to work on weight loss, exercise, and decreasing simple carbohydrates in her diet to help decrease the risk of diabetes. She was informed that eating too many simple carbohydrates or too many calories at one sitting increases the likelihood of GI side effects. We will repeat labs at her first in person appointment. Ruth [name] agreed to follow up with [place] as directed to monitor her progress.  Obesity Ruth [name] is currently in the action stage of change. As such, her goal is to continue with weight loss efforts She has agreed to follow the Category 2 plan Ruth [name] has been instructed to work up to a goal of 150 minutes of combined cardio and strengthening exercise per week for weight loss and overall health benefits. We discussed the following Behavioral Modification Strategies today: planning for success, keeping healthy foods in the home, increasing lean protein intake, increasing vegetables and work on meal planning and easy cooking plans  Ruth [name] has agreed to follow up with our clinic in 2 weeks. She was informed of the importance of frequent  follow up visits to maximize her success with intensive lifestyle modifications for her multiple health conditions.  ALLERGIES: Allergies  Allergen Reactions  . Other     Nuts     MEDICATIONS: Current Outpatient Medications on File Prior to Visit  Medication Sig Dispense Refill  . albuterol (PROAIR HFA) 108 (90 Base) MCG/ACT inhaler Inhale 2 puffs into the lungs every 6 (six) hours as  needed. 18 g 12  . amLODipine (NORVASC) 5 MG tablet TAKE 1 TABLET BY MOUTH EVERY DAY 30 tablet 2  . cetirizine (ZYRTEC) 10 MG chewable tablet Chew 1 tablet (10 mg total) by mouth daily. As needed. 90 tablet 1  . desonide (DESOWEN) 0.05 % lotion APPLY TO AFFECTED AREA TWICE DAILY 59 mL 2  . EPINEPHrine (EPI-PEN) 0.3 mg/0.3 mL SOAJ injection Inject into upper thigh for severe allergic reaction 1 Device prn  . fluticasone (FLONASE) 50 MCG/ACT nasal spray 2 SPRAYS EACH NOSTRIL EVERY DAY 16 g 2  . omeprazole (PRILOSEC) 40 MG capsule Take 1 capsule (40 mg total) by mouth daily. 30 capsule 3  . valACYclovir (VALTREX) 1000 MG tablet Take 1 tablet (1,000 mg total) by mouth 3 (three) times daily. 30 tablet 3   No current facility-administered medications on file prior to visit.     PAST MEDICAL HISTORY: Past Medical History:  Diagnosis Date  . Allergic rhinitis, cause unspecified   . Asthma   . Eczema   . Food allergy   . Gallbladder problem   . Headache   . Hypertension     PAST SURGICAL HISTORY: Past Surgical History:  Procedure Laterality Date  . CESAREAN SECTION    . CESAREAN SECTION Bilateral [id number]    SOCIAL HISTORY: Social History   Tobacco Use  . Smoking status: Never Smoker  . Smokeless tobacco: Never Used  Substance Use Topics  . Alcohol use: Yes    Alcohol/week: 0.0 standard drinks    Comment: one glass of wine per week/fim  . Drug use: No    FAMILY HISTORY: Family History  Problem Relation Age of Onset  . Asthma Mother   . Allergies Mother   . Asthma Father   . Drug abuse Father   . Allergies Father     ROS: Review of Systems  Constitutional: Positive for malaise/fatigue. Negative for weight loss.  Gastrointestinal: Negative for nausea and vomiting.  Musculoskeletal:       Negative for muscle weakness  Endo/Heme/Allergies:       Positive for carb cravings    PHYSICAL EXAM: Pt in no acute distress  RECENT LABS AND TESTS: BMET    Component Value  Date/Time   NA 143 04/13/2018 1001   K 4.1 04/13/2018 1001   CL 103 04/13/2018 1001   CO2 22 04/13/2018 1001   GLUCOSE 88 04/13/2018 1001   GLUCOSE 89 04/18/2017 0936   BUN 11 04/13/2018 1001   CREATININE 0.84 04/13/2018 1001   CREATININE 0.82 08/24/2013 1633   CALCIUM 9.3 04/13/2018 1001   GFRNONAA 78 04/13/2018 1001   GFRAA 90 04/13/2018 1001   Lab Results  Component Value Date   HGBA1C 5.5 04/13/2018   HGBA1C 5.7 (H) 11/02/2017   HGBA1C 5.7 (H) 07/21/2017   Lab Results  Component Value Date   INSULIN 12.8 04/13/2018   INSULIN 6.2 11/02/2017   INSULIN 15.0 07/21/2017   CBC    Component Value Date/Time   WBC 5.3 04/13/2018 1001   WBC 5.5 03/21/2017 0934   RBC 5.09 04/13/2018 1001  RBC 4.96 03/21/2017 0934   HGB 14.2 04/13/2018 1001   HCT 43.1 04/13/2018 1001   PLT 235 04/13/2018 1001   MCV 85 04/13/2018 1001   MCH 27.9 04/13/2018 1001   MCH 28.4 08/24/2013 1633   MCHC 32.9 04/13/2018 1001   MCHC 32.4 03/21/2017 0934   RDW 13.6 04/13/2018 1001   LYMPHSABS 1.9 04/13/2018 1001   MONOABS 0.3 03/21/2017 0934   EOSABS 0.2 04/13/2018 1001   BASOSABS 0.1 04/13/2018 1001   Iron/TIBC/Ferritin/ %Sat No results found for: IRON, TIBC, FERRITIN, IRONPCTSAT Lipid Panel     Component Value Date/Time   CHOL 158 04/13/2018 1001   TRIG 51 04/13/2018 1001   HDL 76 04/13/2018 1001   CHOLHDL 3 03/21/2017 0934   VLDL 17.6 03/21/2017 0934   LDLCALC 72 04/13/2018 1001   Hepatic Function Panel     Component Value Date/Time   PROT 7.6 04/13/2018 1001   ALBUMIN 3.9 04/13/2018 1001   AST 15 04/13/2018 1001   ALT 16 04/13/2018 1001   ALKPHOS 139 (H) 04/13/2018 1001   BILITOT 0.4 04/13/2018 1001   BILIDIR 0.1 10/10/2014 1133   IBILI 0.3 08/24/2013 1633      Component Value Date/Time   TSH 1.720 04/13/2018 1001   TSH 1.530 07/21/2017 0941   TSH 2.12 03/21/2017 0934    Results for ATHINA, FAHEY (MRN [id number]) as of 08/24/2018 12:13  Ref. Range 04/13/2018 10:01   Vitamin D, 25-Hydroxy Latest Ref Range: 30.0 - 100.0 ng/mL 40.8    I, Doreene Nest, am acting as Location manager for Eber Jones, MD   I have reviewed the above documentation for accuracy and completeness, and I agree with the above. - Ilene Qua, MD

## 2018-09-07 ENCOUNTER — Ambulatory Visit (INDEPENDENT_AMBULATORY_CARE_PROVIDER_SITE_OTHER): Payer: BC Managed Care – PPO | Admitting: Family Medicine

## 2018-09-07 ENCOUNTER — Other Ambulatory Visit: Payer: Self-pay

## 2018-09-07 ENCOUNTER — Encounter (INDEPENDENT_AMBULATORY_CARE_PROVIDER_SITE_OTHER): Payer: Self-pay | Admitting: Family Medicine

## 2018-09-07 DIAGNOSIS — I10 Essential (primary) hypertension: Secondary | ICD-10-CM | POA: Diagnosis not present

## 2018-09-07 DIAGNOSIS — R7303 Prediabetes: Secondary | ICD-10-CM

## 2018-09-07 DIAGNOSIS — Z6834 Body mass index (BMI) 34.0-34.9, adult: Secondary | ICD-10-CM | POA: Diagnosis not present

## 2018-09-07 DIAGNOSIS — E669 Obesity, unspecified: Secondary | ICD-10-CM

## 2018-09-07 NOTE — Progress Notes (Signed)
Office: [phone]  /  Fax: [phone] TeleHealth Visit:  Ruth [name] has verbally consented to this TeleHealth visit today. The patient is located at home, the provider is located at the [facility] office. The participants in this visit include the listed provider and patient. The visit was conducted today via face time.  HPI:   Chief Complaint: OBESITY Ruth [name] is here to discuss her progress with her obesity treatment plan. She is on the Category 2 plan and is following her eating plan approximately 20 % of the time. She states she is walking for 60 minutes 4 times per week. Ruth [name] states she isn't hungry in the morning and will not always force herself to eat. She notes some days are better than others. She is trying to do a reset everyday. She notes even on days she is hungry, she isn't eating plan. Her weight is of 217 lbs today. We were unable to weigh the patient today for this TeleHealth visit. She feels as if she has maintained her weight since her last visit. She has lost 0 lbs since starting treatment with [place].  Pre-Diabetes Ruth [name] has a diagnosis of pre-diabetes based on her elevated Hgb A1c of 5.5 and was informed this puts her at greater risk of developing diabetes. She is not taking metformin currently and continues to work on diet and exercise to decrease risk of diabetes. She denies nausea or hypoglycemia.  Hypertension Ruth [name] is a 58 y.o. female with hypertension. Ruth [name]'s blood pressure is controlled at her previous visits. She denies chest pain, chest pressure, or headaches. She is working on weight loss to help control her blood pressure with the goal of decreasing her risk of heart attack and stroke.   ASSESSMENT AND PLAN:  Prediabetes  Essential hypertension  Class 1 obesity with serious comorbidity and body mass index (BMI) of 34.0 to 34.9 in adult, unspecified obesity type  PLAN:  Pre-Diabetes Ruth [name] will continue to work on weight  loss, exercise, and decreasing simple carbohydrates in her diet to help decrease the risk of diabetes. We dicussed metformin including benefits and risks. She was informed that eating too many simple carbohydrates or too many calories at one sitting increases the likelihood of GI side effects. Ruth [name] declined metformin for now and a prescription was not written today. We will repeat labs at her next appointment. Ruth [name] agrees to follow up with our clinic in 2 weeks as directed to monitor her progress.  Hypertension We discussed sodium restriction, working on healthy weight loss, and a regular exercise program as the means to achieve improved blood pressure control. Ruth [name] agreed with this plan and agreed to follow up as directed. We will continue to monitor her blood pressure as well as her progress with the above lifestyle modifications. Ruth [name] agrees to continue her medications and will watch for signs of hypotension as she continues her lifestyle modifications. We will follow up on blood pressure at her next appointment. Ruth [name] agrees to follow up with our clinic in 2 weeks.  Obesity Ruth [name] is currently in the action stage of change. As such, her goal is to continue with weight loss efforts She has agreed to follow the Category 2 plan Ruth [name] has been instructed to work up to a goal of 150 minutes of combined cardio and strengthening exercise per week for weight loss and overall health benefits. We discussed the following Behavioral Modification Strategies today: increasing lean protein intake, increasing vegetables and work on meal planning and  easy cooking plans, keeping healthy foods in the home, better snacking choices, and planning for success   Ruth [name] has agreed to follow up with our clinic in 2 weeks. She was informed of the importance of frequent follow up visits to maximize her success with intensive lifestyle modifications for her multiple health conditions.  ALLERGIES: Allergies  Allergen  Reactions  . Other     Nuts     MEDICATIONS: Current Outpatient Medications on File Prior to Visit  Medication Sig Dispense Refill  . albuterol (PROAIR HFA) 108 (90 Base) MCG/ACT inhaler Inhale 2 puffs into the lungs every 6 (six) hours as needed. 18 g 12  . amLODipine (NORVASC) 5 MG tablet TAKE 1 TABLET BY MOUTH EVERY DAY 30 tablet 2  . cetirizine (ZYRTEC) 10 MG chewable tablet Chew 1 tablet (10 mg total) by mouth daily. As needed. 90 tablet 1  . desonide (DESOWEN) 0.05 % lotion APPLY TO AFFECTED AREA TWICE DAILY 59 mL 2  . EPINEPHrine (EPI-PEN) 0.3 mg/0.3 mL SOAJ injection Inject into upper thigh for severe allergic reaction 1 Device prn  . fluticasone (FLONASE) 50 MCG/ACT nasal spray 2 SPRAYS EACH NOSTRIL EVERY DAY 16 g 2  . omeprazole (PRILOSEC) 40 MG capsule Take 1 capsule (40 mg total) by mouth daily. 30 capsule 3  . valACYclovir (VALTREX) 1000 MG tablet Take 1 tablet (1,000 mg total) by mouth 3 (three) times daily. 30 tablet 3  . Vitamin D, Ergocalciferol, (DRISDOL) 1.25 MG (50000 UT) CAPS capsule Take 1 capsule (50,000 Units total) by mouth every 7 (seven) days. 4 capsule 0   No current facility-administered medications on file prior to visit.     PAST MEDICAL HISTORY: Past Medical History:  Diagnosis Date  . Allergic rhinitis, cause unspecified   . Asthma   . Eczema   . Food allergy   . Gallbladder problem   . Headache   . Hypertension     PAST SURGICAL HISTORY: Past Surgical History:  Procedure Laterality Date  . CESAREAN SECTION    . CESAREAN SECTION Bilateral [id number]    SOCIAL HISTORY: Social History   Tobacco Use  . Smoking status: Never Smoker  . Smokeless tobacco: Never Used  Substance Use Topics  . Alcohol use: Yes    Alcohol/week: 0.0 standard drinks    Comment: one glass of wine per week/fim  . Drug use: No    FAMILY HISTORY: Family History  Problem Relation Age of Onset  . Asthma Mother   . Allergies Mother   . Asthma Father   . Drug  abuse Father   . Allergies Father     ROS: Review of Systems  Constitutional: Negative for weight loss.  Cardiovascular: Negative for chest pain.       Negative chest pressure  Gastrointestinal: Negative for nausea.  Neurological: Negative for headaches.  Endo/Heme/Allergies:       Negative hypoglycemia    PHYSICAL EXAM: Pt in no acute distress  RECENT LABS AND TESTS: BMET    Component Value Date/Time   NA 143 04/13/2018 1001   K 4.1 04/13/2018 1001   CL 103 04/13/2018 1001   CO2 22 04/13/2018 1001   GLUCOSE 88 04/13/2018 1001   GLUCOSE 89 04/18/2017 0936   BUN 11 04/13/2018 1001   CREATININE 0.84 04/13/2018 1001   CREATININE 0.82 08/24/2013 1633   CALCIUM 9.3 04/13/2018 1001   GFRNONAA 78 04/13/2018 1001   GFRAA 90 04/13/2018 1001   Lab Results  Component Value Date  HGBA1C 5.5 04/13/2018   HGBA1C 5.7 (H) 11/02/2017   HGBA1C 5.7 (H) 07/21/2017   Lab Results  Component Value Date   INSULIN 12.8 04/13/2018   INSULIN 6.2 11/02/2017   INSULIN 15.0 07/21/2017   CBC    Component Value Date/Time   WBC 5.3 04/13/2018 1001   WBC 5.5 03/21/2017 0934   RBC 5.09 04/13/2018 1001   RBC 4.96 03/21/2017 0934   HGB 14.2 04/13/2018 1001   HCT 43.1 04/13/2018 1001   PLT 235 04/13/2018 1001   MCV 85 04/13/2018 1001   MCH 27.9 04/13/2018 1001   MCH 28.4 08/24/2013 1633   MCHC 32.9 04/13/2018 1001   MCHC 32.4 03/21/2017 0934   RDW 13.6 04/13/2018 1001   LYMPHSABS 1.9 04/13/2018 1001   MONOABS 0.3 03/21/2017 0934   EOSABS 0.2 04/13/2018 1001   BASOSABS 0.1 04/13/2018 1001   Iron/TIBC/Ferritin/ %Sat No results found for: IRON, TIBC, FERRITIN, IRONPCTSAT Lipid Panel     Component Value Date/Time   CHOL 158 04/13/2018 1001   TRIG 51 04/13/2018 1001   HDL 76 04/13/2018 1001   CHOLHDL 3 03/21/2017 0934   VLDL 17.6 03/21/2017 0934   LDLCALC 72 04/13/2018 1001   Hepatic Function Panel     Component Value Date/Time   PROT 7.6 04/13/2018 1001   ALBUMIN 3.9  04/13/2018 1001   AST 15 04/13/2018 1001   ALT 16 04/13/2018 1001   ALKPHOS 139 (H) 04/13/2018 1001   BILITOT 0.4 04/13/2018 1001   BILIDIR 0.1 10/10/2014 1133   IBILI 0.3 08/24/2013 1633      Component Value Date/Time   TSH 1.720 04/13/2018 1001   TSH 1.530 07/21/2017 0941   TSH 2.12 03/21/2017 0934      I, Trixie Dredge, am acting as transcriptionist for Ilene Qua, MD  I have reviewed the above documentation for accuracy and completeness, and I agree with the above. - Ilene Qua, MD

## 2018-09-08 ENCOUNTER — Other Ambulatory Visit: Payer: Self-pay | Admitting: Family Medicine

## 2018-09-08 DIAGNOSIS — J302 Other seasonal allergic rhinitis: Secondary | ICD-10-CM

## 2018-09-19 ENCOUNTER — Ambulatory Visit (INDEPENDENT_AMBULATORY_CARE_PROVIDER_SITE_OTHER): Payer: BC Managed Care – PPO | Admitting: Family Medicine

## 2018-09-19 ENCOUNTER — Other Ambulatory Visit: Payer: Self-pay

## 2018-09-19 ENCOUNTER — Encounter (INDEPENDENT_AMBULATORY_CARE_PROVIDER_SITE_OTHER): Payer: Self-pay | Admitting: Family Medicine

## 2018-09-19 VITALS — BP 132/83 | HR 88 | Temp 98.0°F | Ht 65.0 in | Wt 222.0 lb

## 2018-09-19 DIAGNOSIS — E559 Vitamin D deficiency, unspecified: Secondary | ICD-10-CM | POA: Diagnosis not present

## 2018-09-19 DIAGNOSIS — Z9189 Other specified personal risk factors, not elsewhere classified: Secondary | ICD-10-CM | POA: Diagnosis not present

## 2018-09-19 DIAGNOSIS — R7303 Prediabetes: Secondary | ICD-10-CM

## 2018-09-19 DIAGNOSIS — Z6837 Body mass index (BMI) 37.0-37.9, adult: Secondary | ICD-10-CM

## 2018-09-19 NOTE — Progress Notes (Signed)
Office: [phone]  /  Fax: [phone]   HPI:   Chief Complaint: OBESITY Ruth [name] is here to discuss her progress with her obesity treatment plan. She is on the Category 2 plan and is following her eating plan approximately 10 % of the time. She states she is walking for 60 minutes 4 times per week. Ruth [name] is having a hard few months. Her daughter was diagnosed with schizophrenia, and her refrigerator went out. She is struggling with working from home and not going out. Her weight is 222 lb (100.7 kg) today and has gained 13 lbs since her last visit. She has lost 0 lbs since starting treatment with [place].  Pre-Diabetes Ruth [name] has a diagnosis of pre-diabetes based on her elevated Hgb A1c and was informed this puts her at greater risk of developing diabetes. Last Hgb A1c was of 5.5 (improved from last year), and insulin of 12.8. She is not taking metformin currently and continues to work on diet and exercise to decrease risk of diabetes. She denies nausea or hypoglycemia.  Vitamin D Deficiency Ruth [name] has a diagnosis of vitamin D deficiency. She is currently taking prescription Vit D. She notes fatigue and denies nausea, vomiting or muscle weakness.  At risk for osteopenia and osteoporosis Ruth [name] is at higher risk of osteopenia and osteoporosis due to vitamin D deficiency.   ASSESSMENT AND PLAN:  Prediabetes  Vitamin D deficiency  At risk for osteoporosis  Class 2 severe obesity with serious comorbidity and body mass index (BMI) of 37.0 to 37.9 in adult, unspecified obesity type (Ruth [name])  PLAN:  Pre-Diabetes Ruth [name] will continue to work on weight loss, exercise, and decreasing simple carbohydrates in her diet to help decrease the risk of diabetes. We dicussed metformin including benefits and risks. She was informed that eating too many simple carbohydrates or too many calories at one sitting increases the likelihood of GI side effects. Ruth [name] declined metformin for now and a prescription was  not written today. We will repeat labs and Ruth [name] agrees to follow up with our clinic in 2 weeks as directed to monitor her progress.  Vitamin D Deficiency Ruth [name] was informed that low vitamin D levels contributes to fatigue and are associated with obesity, breast, and colon cancer. Ruth [name] agrees to continue taking prescription Vit D 50,000 IU every week #4 and we will refill for 1 month. She will follow up for routine testing of vitamin D, at least 2-3 times per year. She was informed of the risk of over-replacement of vitamin D and agrees to not increase her dose unless she discusses this with [place] first. Ruth [name] agrees to follow up with our clinic in 2 weeks.  At risk for osteopenia and osteoporosis Ruth [name] was given extended (15 minutes) osteoporosis prevention counseling today. Ruth [name] is at risk for osteopenia and osteoporsis due to her vitamin D deficiency. She was encouraged to take her vitamin D and follow her higher calcium diet and increase strengthening exercise to help strengthen her bones and decrease her risk of osteopenia and osteoporosis.  Obesity Ruth [name] is currently in the action stage of change. As such, her goal is to continue with weight loss efforts She has agreed to keep a food journal with 1200-1300 calories and 85+ grams of protein daily Ruth [name] has been instructed to work up to a goal of 150 minutes of combined cardio and strengthening exercise per week for weight loss and overall health benefits. We discussed the following Behavioral Modification Strategies today: increasing lean protein intake, increasing vegetables and  work on meal planning and easy cooking plans, keeping healthy foods in the home, better snacking choices, and planning for success   Ruth [name] has agreed to follow up with our clinic in 2 weeks. She was informed of the importance of frequent follow up visits to maximize her success with intensive lifestyle modifications for her multiple health conditions.  ALLERGIES:  Allergies  Allergen Reactions  . Other     Nuts     MEDICATIONS: Current Outpatient Medications on File Prior to Visit  Medication Sig Dispense Refill  . albuterol (PROAIR HFA) 108 (90 Base) MCG/ACT inhaler Inhale 2 puffs into the lungs every 6 (six) hours as needed. 18 g 12  . amLODipine (NORVASC) 5 MG tablet TAKE 1 TABLET BY MOUTH EVERY DAY 30 tablet 2  . cetirizine (ZYRTEC) 10 MG chewable tablet Chew 1 tablet (10 mg total) by mouth daily. As needed. 90 tablet 1  . desonide (DESOWEN) 0.05 % lotion APPLY TO AFFECTED AREA TWICE DAILY 59 mL 2  . EPINEPHrine (EPI-PEN) 0.3 mg/0.3 mL SOAJ injection Inject into upper thigh for severe allergic reaction 1 Device prn  . fluticasone (FLONASE) 50 MCG/ACT nasal spray INSTILL 2 SPRAYS INTO EACH NOSTRIL EVERY DAY 16 g 2  . omeprazole (PRILOSEC) 40 MG capsule Take 1 capsule (40 mg total) by mouth daily. 30 capsule 3  . valACYclovir (VALTREX) 1000 MG tablet Take 1 tablet (1,000 mg total) by mouth 3 (three) times daily. 30 tablet 3  . Vitamin D, Ergocalciferol, (DRISDOL) 1.25 MG (50000 UT) CAPS capsule Take 1 capsule (50,000 Units total) by mouth every 7 (Ruth [name]) days. 4 capsule 0   No current facility-administered medications on file prior to visit.     PAST MEDICAL HISTORY: Past Medical History:  Diagnosis Date  . Allergic rhinitis, cause unspecified   . Asthma   . Eczema   . Food allergy   . Gallbladder problem   . Headache   . Hypertension     PAST SURGICAL HISTORY: Past Surgical History:  Procedure Laterality Date  . CESAREAN SECTION    . CESAREAN SECTION Bilateral [id number]    SOCIAL HISTORY: Social History   Tobacco Use  . Smoking status: Never Smoker  . Smokeless tobacco: Never Used  Substance Use Topics  . Alcohol use: Yes    Alcohol/week: 0.0 standard drinks    Comment: one glass of wine per week/fim  . Drug use: No    FAMILY HISTORY: Family History  Problem Relation Age of Onset  . Asthma Mother   . Allergies  Mother   . Asthma Father   . Drug abuse Father   . Allergies Father     ROS: Review of Systems  Constitutional: Positive for malaise/fatigue. Negative for weight loss.  Gastrointestinal: Negative for nausea and vomiting.  Musculoskeletal:       Negative muscle weakness  Endo/Heme/Allergies:       Negative hypoglycemia    PHYSICAL EXAM: Blood pressure 132/83, pulse 88, temperature 98 F (36.7 C), height 5\' 5"  (1.651 m), weight 222 lb (100.7 kg), last menstrual period 09/19/2015, SpO2 100 %. Body mass index is 36.94 kg/m. Physical Exam Vitals signs reviewed.  Constitutional:      Appearance: Normal appearance. She is obese.  Cardiovascular:     Rate and Rhythm: Normal rate.     Pulses: Normal pulses.  Pulmonary:     Effort: Pulmonary effort is normal.     Breath sounds: Normal breath sounds.  Musculoskeletal: Normal range of motion.  Skin:    General: Skin is warm and dry.  Neurological:     Mental Status: She is alert and oriented to person, place, and time.  Psychiatric:        Mood and Affect: Mood normal.        Behavior: Behavior normal.     RECENT LABS AND TESTS: BMET    Component Value Date/Time   NA 143 04/13/2018 1001   K 4.1 04/13/2018 1001   CL 103 04/13/2018 1001   CO2 22 04/13/2018 1001   GLUCOSE 88 04/13/2018 1001   GLUCOSE 89 04/18/2017 0936   BUN 11 04/13/2018 1001   CREATININE 0.84 04/13/2018 1001   CREATININE 0.82 08/24/2013 1633   CALCIUM 9.3 04/13/2018 1001   GFRNONAA 78 04/13/2018 1001   GFRAA 90 04/13/2018 1001   Lab Results  Component Value Date   HGBA1C 5.5 04/13/2018   HGBA1C 5.7 (H) 11/02/2017   HGBA1C 5.7 (H) 07/21/2017   Lab Results  Component Value Date   INSULIN 12.8 04/13/2018   INSULIN 6.2 11/02/2017   INSULIN 15.0 07/21/2017   CBC    Component Value Date/Time   WBC 5.3 04/13/2018 1001   WBC 5.5 03/21/2017 0934   RBC 5.09 04/13/2018 1001   RBC 4.96 03/21/2017 0934   HGB 14.2 04/13/2018 1001   HCT 43.1  04/13/2018 1001   PLT 235 04/13/2018 1001   MCV 85 04/13/2018 1001   MCH 27.9 04/13/2018 1001   MCH 28.4 08/24/2013 1633   MCHC 32.9 04/13/2018 1001   MCHC 32.4 03/21/2017 0934   RDW 13.6 04/13/2018 1001   LYMPHSABS 1.9 04/13/2018 1001   MONOABS 0.3 03/21/2017 0934   EOSABS 0.2 04/13/2018 1001   BASOSABS 0.1 04/13/2018 1001   Iron/TIBC/Ferritin/ %Sat No results found for: IRON, TIBC, FERRITIN, IRONPCTSAT Lipid Panel     Component Value Date/Time   CHOL 158 04/13/2018 1001   TRIG 51 04/13/2018 1001   HDL 76 04/13/2018 1001   CHOLHDL 3 03/21/2017 0934   VLDL 17.6 03/21/2017 0934   LDLCALC 72 04/13/2018 1001   Hepatic Function Panel     Component Value Date/Time   PROT 7.6 04/13/2018 1001   ALBUMIN 3.9 04/13/2018 1001   AST 15 04/13/2018 1001   ALT 16 04/13/2018 1001   ALKPHOS 139 (H) 04/13/2018 1001   BILITOT 0.4 04/13/2018 1001   BILIDIR 0.1 10/10/2014 1133   IBILI 0.3 08/24/2013 1633      Component Value Date/Time   TSH 1.720 04/13/2018 1001   TSH 1.530 07/21/2017 0941   TSH 2.12 03/21/2017 0934      OBESITY BEHAVIORAL INTERVENTION VISIT  Today's visit was # 18   Starting weight: 216 lbs Starting date: 07/21/17 Today's weight : 222 lbs Today's date: 09/19/2018 Total lbs lost to date: 0    ASK: We discussed the diagnosis of obesity with Ruth [name] today and Ruth [name] agreed to give [place] permission to discuss obesity behavioral modification therapy today.  ASSESS: Ruth [name] has the diagnosis of obesity and her BMI today is 36.94 Ruth [name] is in the action stage of change   ADVISE: Ruth [name] was educated on the multiple health risks of obesity as well as the benefit of weight loss to improve her health. She was advised of the need for long term treatment and the importance of lifestyle modifications to improve her current health and to decrease her risk of future health problems.  AGREE: Multiple dietary modification options and treatment options were discussed and  Ruth [name] agreed to follow the recommendations documented in the above note.  ARRANGE: Ruth [name] was educated on the importance of frequent visits to treat obesity as outlined per CMS and USPSTF guidelines and agreed to schedule her next follow up appointment today.  Ruth [name], Ruth [name], am acting as transcriptionist for Ilene Qua, MD  Ruth [name] have reviewed the above documentation for accuracy and completeness, and Ruth [name] agree with the above. - Ilene Qua, MD

## 2018-10-03 ENCOUNTER — Other Ambulatory Visit: Payer: Self-pay | Admitting: Family Medicine

## 2018-10-05 ENCOUNTER — Other Ambulatory Visit: Payer: Self-pay

## 2018-10-05 ENCOUNTER — Encounter (INDEPENDENT_AMBULATORY_CARE_PROVIDER_SITE_OTHER): Payer: Self-pay | Admitting: Family Medicine

## 2018-10-05 ENCOUNTER — Ambulatory Visit (INDEPENDENT_AMBULATORY_CARE_PROVIDER_SITE_OTHER): Payer: BC Managed Care – PPO | Admitting: Family Medicine

## 2018-10-05 VITALS — BP 112/78 | HR 83 | Temp 97.9°F | Ht 65.0 in | Wt 223.0 lb

## 2018-10-05 DIAGNOSIS — E559 Vitamin D deficiency, unspecified: Secondary | ICD-10-CM

## 2018-10-05 DIAGNOSIS — Z6837 Body mass index (BMI) 37.0-37.9, adult: Secondary | ICD-10-CM

## 2018-10-05 DIAGNOSIS — Z9189 Other specified personal risk factors, not elsewhere classified: Secondary | ICD-10-CM | POA: Diagnosis not present

## 2018-10-05 DIAGNOSIS — R7303 Prediabetes: Secondary | ICD-10-CM

## 2018-10-05 DIAGNOSIS — R748 Abnormal levels of other serum enzymes: Secondary | ICD-10-CM | POA: Diagnosis not present

## 2018-10-05 NOTE — Progress Notes (Signed)
Office: [phone]  /  Fax: [phone]   HPI:   Chief Complaint: OBESITY Ruth [name] is here to discuss her progress with her obesity treatment plan. She is on the keep a food journal with 1200-1300 calories and 85+ grams of protein daily and is following her eating plan approximately 20 % of the time. She states she is walking for 30-45 minutes 20 times per week. Ruth [name] is doing protein shakes for breakfast, Healthy Choice for lunch (occasionally skipping lunch), and the dinner is sporadic with often ordering out. She notes when she skips lunch she often binges at dinner.  Her weight is 223 lb (101.2 kg) today and has gained 1 lb since her last visit. She has lost 0 lbs since starting treatment with [place].  Vitamin D Deficiency Ruth [name] has a diagnosis of vitamin D deficiency. She is currently taking prescription Vit D. She notes fatigue and denies nausea, vomiting or muscle weakness.  At risk for osteopenia and osteoporosis Ruth [name] is at higher risk of osteopenia and osteoporosis due to vitamin D deficiency.   Pre-Diabetes Ruth [name] has a diagnosis of pre-diabetes based on her elevated Hgb A1c and was informed this puts her at greater risk of developing diabetes. She notes carbohydrate cravings. She is not taking metformin currently and continues to work on diet and exercise to decrease risk of diabetes. She denies nausea or hypoglycemia.  Elevated LFTs Ruth [name]'s Alk phos was previously elevated, and ALT and AST were within normal limits. She denies abdominal pain or jaundice and has never been told of any liver problems in the past. She denies excessive alcohol intake.  ASSESSMENT AND PLAN:  Vitamin D deficiency - Plan: VITAMIN D 25 Hydroxy (Vit-D Deficiency, Fractures)  Prediabetes - Plan: Comprehensive metabolic panel, Hemoglobin A1c, Insulin, Free and Total, CANCELED: Lipid Panel With LDL/HDL Ratio  Elevated alkaline phosphatase level  At risk for osteoporosis  Class 2 severe obesity with  serious comorbidity and body mass index (BMI) of 37.0 to 37.9 in adult, unspecified obesity type ([place])  PLAN:  Vitamin D Deficiency Ruth [name] was informed that low vitamin D levels contributes to fatigue and are associated with obesity, breast, and colon cancer. Ruth [name] agrees to continue taking prescription Vit D 50,000 IU every week and will follow up for routine testing of vitamin D, at least 2-3 times per year. She was informed of the risk of over-replacement of vitamin D and agrees to not increase her dose unless she discusses this with [place] first. We will check Vit D level today. Ruth [name] agrees to follow up with our clinic in 2 weeks.  At risk for osteopenia and osteoporosis Ruth [name] was given extended (15 minutes) osteoporosis prevention counseling today. Ruth [name] is at risk for osteopenia and osteoporsis due to her vitamin D deficiency. She was encouraged to take her vitamin D and follow her higher calcium diet and increase strengthening exercise to help strengthen her bones and decrease her risk of osteopenia and osteoporosis.  Pre-Diabetes Ruth [name] will continue to work on weight loss, exercise, and decreasing simple carbohydrates in her diet to help decrease the risk of diabetes. We dicussed metformin including benefits and risks. She was informed that eating too many simple carbohydrates or too many calories at one sitting increases the likelihood of GI side effects. Ruth [name] declined metformin for now and a prescription was not written today. We will check Hgb A1c and insulin today. Kattie agrees to follow up with our clinic in 2 weeks as directed to monitor her progress.  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 Ruth [name] was educated on her risk of developing NASH or even liver failure and th only proven treatment for NAFLD was weight loss. Ruth [name] agreed to continue with her weight loss efforts with healthier diet and  exercise as an essential part of her treatment plan. We will check CMP today. Ruth [name] agrees to follow up with our clinic in 2 weeks.  Obesity Ruth [name] is currently in the action stage of change. As such, her goal is to continue with weight loss efforts She has agreed to keep a food journal with 1200-1300 calories and 85+ grams of protein daily Ruth [name] has been instructed to work up to a goal of 150 minutes of combined cardio and strengthening exercise per week for weight loss and overall health benefits. We discussed the following Behavioral Modification Strategies today: increasing lean protein intake, increasing vegetables, increase H20 intake, no skipping meals, and keep a strict food journal   Ruth [name] has agreed to follow up with our clinic in 2 weeks. She was informed of the importance of frequent follow up visits to maximize her success with intensive lifestyle modifications for her multiple health conditions.  ALLERGIES: Allergies  Allergen Reactions   Other     Nuts     MEDICATIONS: Current Outpatient Medications on File Prior to Visit  Medication Sig Dispense Refill   albuterol (PROAIR HFA) 108 (90 Base) MCG/ACT inhaler Inhale 2 puffs into the lungs every 6 (six) hours as needed. 18 g 12   amLODipine (NORVASC) 5 MG tablet TAKE 1 TABLET BY MOUTH EVERY DAY 30 tablet 2   cetirizine (ZYRTEC) 10 MG chewable tablet Chew 1 tablet (10 mg total) by mouth daily. As needed. 90 tablet 1   desonide (DESOWEN) 0.05 % lotion APPLY TO AFFECTED AREA TWICE DAILY 59 mL 2   EPINEPHrine (EPI-PEN) 0.3 mg/0.3 mL SOAJ injection Inject into upper thigh for severe allergic reaction 1 Device prn   fluticasone (FLONASE) 50 MCG/ACT nasal spray INSTILL 2 SPRAYS INTO EACH NOSTRIL EVERY DAY 16 g 2   omeprazole (PRILOSEC) 40 MG capsule Take 1 capsule (40 mg total) by mouth daily. 30 capsule 3   valACYclovir (VALTREX) 1000 MG tablet Take 1 tablet (1,000 mg total) by mouth 3 (three) times daily. 30 tablet 3    Vitamin D, Ergocalciferol, (DRISDOL) 1.25 MG (50000 UT) CAPS capsule Take 1 capsule (50,000 Units total) by mouth every 7 (seven) days. 4 capsule 0   No current facility-administered medications on file prior to visit.     PAST MEDICAL HISTORY: Past Medical History:  Diagnosis Date   Allergic rhinitis, cause unspecified    Asthma    Eczema    Food allergy    Gallbladder problem    Headache    Hypertension     PAST SURGICAL HISTORY: Past Surgical History:  Procedure Laterality Date   CESAREAN SECTION     CESAREAN SECTION Bilateral [id number]    SOCIAL HISTORY: Social History   Tobacco Use   Smoking status: Never Smoker   Smokeless tobacco: Never Used  Substance Use Topics   Alcohol use: Yes    Alcohol/week: 0.0 standard drinks    Comment: one glass of wine per week/fim   Drug use: No    FAMILY HISTORY: Family History  Problem Relation Age of Onset   Asthma Mother    Allergies Mother    Asthma Father    Drug abuse  Father    Allergies Father     ROS: Review of Systems  Constitutional: Positive for malaise/fatigue. Negative for weight loss.  Eyes:       Negative jaundice  Gastrointestinal: Negative for abdominal pain, nausea and vomiting.  Musculoskeletal:       Negative muscle weakness  Endo/Heme/Allergies:       Negative hypoglycemia    PHYSICAL EXAM: Blood pressure 112/78, pulse 83, temperature 97.9 F (36.6 C), temperature source Oral, height _0  (1.651 m), weight 223 lb (101.2 kg), last menstrual period 09/19/2015, SpO2 97 %. Body mass index is 37.11 kg/m. Physical Exam Vitals signs reviewed.  Constitutional:      Appearance: Normal appearance. She is obese.  Cardiovascular:     Rate and Rhythm: Normal rate.     Pulses: Normal pulses.  Pulmonary:     Effort: Pulmonary effort is normal.     Breath sounds: Normal breath sounds.  Musculoskeletal: Normal range of motion.  Skin:    General: Skin is warm and dry.  Neurological:      Mental Status: She is alert and oriented to person, place, and time.  Psychiatric:        Mood and Affect: Mood normal.        Behavior: Behavior normal.     RECENT LABS AND TESTS: BMET    Component Value Date/Time   NA 143 04/13/2018 1001   K 4.1 04/13/2018 1001   CL 103 04/13/2018 1001   CO2 22 04/13/2018 1001   GLUCOSE 88 04/13/2018 1001   GLUCOSE 89 04/18/2017 0936   BUN 11 04/13/2018 1001   CREATININE 0.84 04/13/2018 1001   CREATININE 0.82 08/24/2013 1633   CALCIUM 9.3 04/13/2018 1001   GFRNONAA 78 04/13/2018 1001   GFRAA 90 04/13/2018 1001   Lab Results  Component Value Date   HGBA1C 5.5 04/13/2018   HGBA1C 5.7 (H) 11/02/2017   HGBA1C 5.7 (H) 07/21/2017   Lab Results  Component Value Date   INSULIN 12.8 04/13/2018   INSULIN 6.2 11/02/2017   INSULIN 15.0 07/21/2017   CBC    Component Value Date/Time   WBC 5.3 04/13/2018 1001   WBC 5.5 03/21/2017 0934   RBC 5.09 04/13/2018 1001   RBC 4.96 03/21/2017 0934   HGB 14.2 04/13/2018 1001   HCT 43.1 04/13/2018 1001   PLT 235 04/13/2018 1001   MCV 85 04/13/2018 1001   MCH 27.9 04/13/2018 1001   MCH 28.4 08/24/2013 1633   MCHC 32.9 04/13/2018 1001   MCHC 32.4 03/21/2017 0934   RDW 13.6 04/13/2018 1001   LYMPHSABS 1.9 04/13/2018 1001   MONOABS 0.3 03/21/2017 0934   EOSABS 0.2 04/13/2018 1001   BASOSABS 0.1 04/13/2018 1001   Iron/TIBC/Ferritin/ %Sat No results found for: IRON, TIBC, FERRITIN, IRONPCTSAT Lipid Panel     Component Value Date/Time   CHOL 158 04/13/2018 1001   TRIG 51 04/13/2018 1001   HDL 76 04/13/2018 1001   CHOLHDL 3 03/21/2017 0934   VLDL 17.6 03/21/2017 0934   LDLCALC 72 04/13/2018 1001   Hepatic Function Panel     Component Value Date/Time   PROT 7.6 04/13/2018 1001   ALBUMIN 3.9 04/13/2018 1001   AST 15 04/13/2018 1001   ALT 16 04/13/2018 1001   ALKPHOS 139 (H) 04/13/2018 1001   BILITOT 0.4 04/13/2018 1001   BILIDIR 0.1 10/10/2014 1133   IBILI 0.3 08/24/2013 1633        Component Value Date/Time   TSH 1.720 04/13/2018 1001  TSH 1.530 07/21/2017 0941   TSH 2.12 03/21/2017 0934      OBESITY BEHAVIORAL INTERVENTION VISIT  Today's visit was # 19   Starting weight: 216 lbs Starting date: 07/21/17 Today's weight : 223 lbs Today's date: 10/05/2018 Total lbs lost to date: 0    ASK: We discussed the diagnosis of obesity with Kenney Houseman M [place] today and Arayla agreed to give [place] permission to discuss obesity behavioral modification therapy today.  ASSESS: Rayelynn has the diagnosis of obesity and her BMI today is 37.11 Taressa is in the action stage of change   ADVISE: Jeralyn was educated on the multiple health risks of obesity as well as the benefit of weight loss to improve her health. She was advised of the need for long term treatment and the importance of lifestyle modifications to improve her current health and to decrease her risk of future health problems.  AGREE: Multiple dietary modification options and treatment options were discussed and  Gelisa agreed to follow the recommendations documented in the above note.  ARRANGE: Azaliyah was educated on the importance of frequent visits to treat obesity as outlined per CMS and USPSTF guidelines and agreed to schedule her next follow up appointment today.  I, Trixie Dredge, am acting as transcriptionist for Ilene Qua, MD  I have reviewed the above documentation for accuracy and completeness, and I agree with the above. - Ilene Qua, MD

## 2018-10-06 LAB — COMPREHENSIVE METABOLIC PANEL
ALT: 26 IU/L (ref 0–32)
AST: 18 IU/L (ref 0–40)
Albumin/Globulin Ratio: 1.5 (ref 1.2–2.2)
Albumin: 4.3 g/dL (ref 3.8–4.9)
Alkaline Phosphatase: 148 IU/L — ABNORMAL HIGH (ref 39–117)
BUN/Creatinine Ratio: 19 (ref 9–23)
BUN: 16 mg/dL (ref 6–24)
Bilirubin Total: 0.3 mg/dL (ref 0.0–1.2)
CO2: 24 mmol/L (ref 20–29)
Calcium: 9.2 mg/dL (ref 8.7–10.2)
Chloride: 104 mmol/L (ref 96–106)
Creatinine, Ser: 0.85 mg/dL (ref 0.57–1.00)
GFR calc Af Amer: 89 mL/min/{1.73_m2} (ref >59)
GFR calc non Af Amer: 77 mL/min/{1.73_m2} (ref >59)
Globulin, Total: 2.9 g/dL (ref 1.5–4.5)
Glucose: 87 mg/dL (ref 65–99)
Potassium: 3.9 mmol/L (ref 3.5–5.2)
Sodium: 144 mmol/L (ref 134–144)
Total Protein: 7.2 g/dL (ref 6.0–8.5)

## 2018-10-06 LAB — HEMOGLOBIN A1C
Est. average glucose Bld gHb Est-mCnc: 117 mg/dL
Hgb A1c MFr Bld: 5.7 % — ABNORMAL HIGH (ref 4.8–5.6)

## 2018-10-06 LAB — LIPID PANEL WITH LDL/HDL RATIO
Cholesterol, Total: 174 mg/dL (ref 100–199)
HDL: 61 mg/dL (ref >39)
LDL Calculated: 100 mg/dL — ABNORMAL HIGH (ref 0–99)
LDl/HDL Ratio: 1.6 ratio (ref 0.0–3.2)
Triglycerides: 63 mg/dL (ref 0–149)
VLDL Cholesterol Cal: 13 mg/dL (ref 5–40)

## 2018-10-06 LAB — VITAMIN D 25 HYDROXY (VIT D DEFICIENCY, FRACTURES): Vit D, 25-Hydroxy: 37.2 ng/mL (ref 30.0–100.0)

## 2018-10-06 LAB — INSULIN, RANDOM: INSULIN: 9.5 u[IU]/mL (ref 2.6–24.9)

## 2018-10-23 ENCOUNTER — Ambulatory Visit (INDEPENDENT_AMBULATORY_CARE_PROVIDER_SITE_OTHER): Payer: BC Managed Care – PPO | Admitting: Family Medicine

## 2018-11-16 ENCOUNTER — Other Ambulatory Visit: Payer: Self-pay | Admitting: Family Medicine

## 2018-12-15 ENCOUNTER — Other Ambulatory Visit: Payer: Self-pay | Admitting: *Deleted

## 2018-12-15 MED ORDER — ALBUTEROL SULFATE HFA 108 (90 BASE) MCG/ACT IN AERS: 2.0000 | INHALATION_SPRAY | Freq: Four times a day (QID) | RESPIRATORY_TRACT | 1 refills | Status: DC | PRN

## 2019-01-16 ENCOUNTER — Other Ambulatory Visit: Payer: Self-pay | Admitting: *Deleted

## 2019-01-16 MED ORDER — AMLODIPINE BESYLATE 5 MG PO TABS: 5.0000 mg | ORAL_TABLET | Freq: Every day | ORAL | 0 refills | Status: DC

## 2019-01-19 ENCOUNTER — Other Ambulatory Visit: Payer: Self-pay

## 2019-01-19 ENCOUNTER — Encounter: Payer: Self-pay | Admitting: Family Medicine

## 2019-01-19 ENCOUNTER — Ambulatory Visit: Payer: BC Managed Care – PPO | Admitting: Family Medicine

## 2019-01-19 DIAGNOSIS — I10 Essential (primary) hypertension: Secondary | ICD-10-CM | POA: Diagnosis not present

## 2019-01-19 NOTE — Progress Notes (Signed)
Patient ID: Ruth [name], female    DOB: Jan 01, 1961  Age: 58 y.o. MRN: [id number]    Subjective:  Subjective  HPI Ruth [name] presents for bp check   bp has been running 112/70,  120/80 at home and in other dr s offices .  Pt ate fried chicken last night   Review of Systems  Constitutional: Negative for appetite change, diaphoresis, fatigue and unexpected weight change.  Eyes: Negative for pain, redness and visual disturbance.  Respiratory: Negative for cough, chest tightness, shortness of breath and wheezing.   Cardiovascular: Negative for chest pain, palpitations and leg swelling.  Endocrine: Negative for cold intolerance, heat intolerance, polydipsia, polyphagia and polyuria.  Genitourinary: Negative for difficulty urinating, dysuria and frequency.  Neurological: Negative for dizziness, light-headedness, numbness and headaches.    History Past Medical History:  Diagnosis Date   Allergic rhinitis, cause unspecified    Asthma    Eczema    Food allergy    Gallbladder problem    Headache    Hypertension     She has a past surgical history that includes Cesarean section and Cesarean section (Bilateral, [id number]).   Her family history includes Allergies in her father and mother; Asthma in her father and mother; Drug abuse in her father.She reports that she has never smoked. She has never used smokeless tobacco. She reports current alcohol use. She reports that she does not use drugs.  Current Outpatient Medications on File Prior to Visit  Medication Sig Dispense Refill   albuterol (VENTOLIN HFA) 108 (90 Base) MCG/ACT inhaler Inhale 2 puffs into the lungs every 6 (six) hours as needed for wheezing or shortness of breath. 18 g 1   amLODipine (NORVASC) 5 MG tablet Take 1 tablet (5 mg total) by mouth daily. 30 tablet 0   cetirizine (ZYRTEC) 10 MG chewable tablet Chew 1 tablet (10 mg total) by mouth daily. As needed. 90 tablet 1   desonide (DESOWEN) 0.05 % lotion  APPLY TO AFFECTED AREA TWICE DAILY 59 mL 2   EPINEPHrine (EPI-PEN) 0.3 mg/0.3 mL SOAJ injection Inject into upper thigh for severe allergic reaction 1 Device prn   fluticasone (FLONASE) 50 MCG/ACT nasal spray INSTILL 2 SPRAYS INTO EACH NOSTRIL EVERY DAY 16 g 2   omeprazole (PRILOSEC) 40 MG capsule Take 1 capsule (40 mg total) by mouth daily. 30 capsule 3   valACYclovir (VALTREX) 1000 MG tablet Take 1 tablet (1,000 mg total) by mouth 3 (three) times daily. 30 tablet 3   Vitamin D, Ergocalciferol, (DRISDOL) 1.25 MG (50000 UT) CAPS capsule Take 1 capsule (50,000 Units total) by mouth every 7 (seven) days. 4 capsule 0   No current facility-administered medications on file prior to visit.      Objective:  Objective  Physical Exam Vitals signs and nursing note reviewed.  Constitutional:      Appearance: She is well-developed.  HENT:     Head: Normocephalic and atraumatic.  Eyes:     Conjunctiva/sclera: Conjunctivae normal.  Neck:     Musculoskeletal: Normal range of motion and neck supple.     Thyroid: No thyromegaly.     Vascular: No carotid bruit or JVD.  Cardiovascular:     Rate and Rhythm: Normal rate and regular rhythm.     Heart sounds: Normal heart sounds. No murmur.  Pulmonary:     Effort: Pulmonary effort is normal. No respiratory distress.     Breath sounds: Normal breath sounds. No wheezing or rales.  Chest:  Chest wall: No tenderness.  Neurological:     Mental Status: She is alert and oriented to person, place, and time.    BP (!) 130/100 (BP Location: Right Arm, Patient Position: Sitting, Cuff Size: Large)    Pulse 94    Temp 97.6 F (36.4 C) (Temporal)    Resp 18    Ht 5\' 5"  (1.651 m)    Wt 227 lb 9.6 oz (103.2 kg)    LMP 09/19/2015    SpO2 98%    BMI 37.87 kg/m  Wt Readings from Last 3 Encounters:  01/19/19 227 lb 9.6 oz (103.2 kg)  10/05/18 223 lb (101.2 kg)  09/19/18 222 lb (100.7 kg)     Lab Results  Component Value Date   WBC 5.3 04/13/2018   HGB  14.2 04/13/2018   HCT 43.1 04/13/2018   PLT 235 04/13/2018   GLUCOSE 87 10/05/2018   CHOL 174 10/05/2018   TRIG 63 10/05/2018   HDL 61 10/05/2018   LDLCALC 100 (H) 10/05/2018   ALT 26 10/05/2018   AST 18 10/05/2018   NA 144 10/05/2018   K 3.9 10/05/2018   CL 104 10/05/2018   CREATININE 0.85 10/05/2018   BUN 16 10/05/2018   CO2 24 10/05/2018   TSH 1.720 04/13/2018   HGBA1C 5.7 (H) 10/05/2018   MICROALBUR 1.1 07/03/2012    Ct Angio Head W/cm &/or Wo Cm  Result Date: 04/16/2015 CLINICAL DATA:  Abnormal MRI and. Anterior communicating artery aneurysm versus infundibulum. EXAM: CT ANGIOGRAPHY HEAD TECHNIQUE: Multidetector CT imaging of the head was performed using the standard protocol during bolus administration of intravenous contrast. Multiplanar CT image reconstructions and MIPs were obtained to evaluate the vascular anatomy. CONTRAST:  100 mL Isovue 370 COMPARISON:  MRA circle of Willis 04/04/2015 FINDINGS: CT HEAD Brain: Mild periventricular white matter hypoattenuation is again noted. No acute cortical infarct is present. The basal ganglia are intact. The ventricles are of normal size. No significant extra-axial fluid collection is present. Calvarium and skull base: The skullbase is within normal limits. Paranasal sinuses: The paranasal sinuses and mastoid air cells are clear. Orbits: Within normal limits. 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 No focal stenosis is evident through the skullbase. The ICA terminus is within normal limits. The left internal carotid artery is within normal limits to the ICA terminus. The left A1 segment feeds both ACA vessels with a patent anterior communicating artery. 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 There is no definitive vessel associated with this area at to identify this is an infundibulum. The MCA bifurcations are within normal  limits bilaterally. The ACA and MCA branch vessels are unremarkable. Posterior circulation: The vertebral arteries are codominant. Both PICA origins are visualized and normal. The basilar artery is normal. The posterior cerebral arteries both originate from the basilar tip. The PCA branch vessels are within normal limits. Venous sinuses: The dural sinuses are normally patent. The straight sinus and deep cerebral veins are intact. The left transverse sinus is dominant. The cortical veins are unremarkable. Anatomic variants: A prominent left posterior communicating artery is patent. Delayed phase:The postcontrast images demonstrate no pathologic enhancement. IMPRESSION: 1. 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 2. Otherwise normal CTA circle of Willis without focal stenosis, other aneurysm, or branch vessel occlusion. 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 Electronically Signed   By: San Morelle  M.D.   On: 04/16/2015 11:44     Assessment & Plan:  Plan  I am having Ruth Patricia. [name] maintain her EPINEPHrine, omeprazole, cetirizine, valACYclovir, desonide, Vitamin D (Ergocalciferol), fluticasone, albuterol, and amLODipine.  No orders of the defined types were placed in this encounter.   Problem List Items Addressed This Visit      Unprioritized   Essential hypertension    Pt states bp has been much lower at home and in other drs offices---- I can see the bp is some of the other offices  she had a high salt meal last night Poorly controlled, encouraged DASH diet, minimize caffeine and obtain adequate sleep. Report concerning symptoms and follow up as directed and as needed Will recheck 2-3 weeks or sooner if pt calls with questions or concerns         Follow-up: Return in about 3 weeks (around 02/09/2019), or if symptoms worsen or fail to improve, for hypertension.  Ann Held, DO

## 2019-01-19 NOTE — Assessment & Plan Note (Signed)
Pt states bp has been much lower at home and in other drs offices---- I can see the bp is some of the other offices  she had a high salt meal last night Poorly controlled, encouraged DASH diet, minimize caffeine and obtain adequate sleep. Report concerning symptoms and follow up as directed and as needed Will recheck 2-3 weeks or sooner if pt calls with questions or concerns

## 2019-02-12 DIAGNOSIS — Z20828 Contact with and (suspected) exposure to other viral communicable diseases: Secondary | ICD-10-CM | POA: Diagnosis not present

## 2019-02-13 ENCOUNTER — Other Ambulatory Visit: Payer: Self-pay | Admitting: *Deleted

## 2019-02-13 MED ORDER — ALBUTEROL SULFATE HFA 108 (90 BASE) MCG/ACT IN AERS: 2.0000 | INHALATION_SPRAY | Freq: Four times a day (QID) | RESPIRATORY_TRACT | 1 refills | Status: DC | PRN

## 2019-02-13 MED ORDER — AMLODIPINE BESYLATE 5 MG PO TABS: 5.0000 mg | ORAL_TABLET | Freq: Every day | ORAL | 0 refills | Status: DC

## 2019-02-16 ENCOUNTER — Ambulatory Visit: Payer: BC Managed Care – PPO | Admitting: Family Medicine

## 2019-02-28 ENCOUNTER — Ambulatory Visit: Payer: BC Managed Care – PPO | Admitting: Family Medicine

## 2019-03-05 ENCOUNTER — Encounter: Payer: Self-pay | Admitting: Family Medicine

## 2019-03-05 ENCOUNTER — Ambulatory Visit: Payer: BC Managed Care – PPO | Admitting: Family Medicine

## 2019-03-05 ENCOUNTER — Other Ambulatory Visit: Payer: Self-pay

## 2019-03-05 VITALS — BP 130/90 | HR 91 | Temp 97.8°F | Resp 18 | Ht 65.0 in | Wt 226.8 lb

## 2019-03-05 DIAGNOSIS — J4541 Moderate persistent asthma with (acute) exacerbation: Secondary | ICD-10-CM | POA: Diagnosis not present

## 2019-03-05 DIAGNOSIS — Z6834 Body mass index (BMI) 34.0-34.9, adult: Secondary | ICD-10-CM | POA: Diagnosis not present

## 2019-03-05 DIAGNOSIS — E669 Obesity, unspecified: Secondary | ICD-10-CM

## 2019-03-05 DIAGNOSIS — I10 Essential (primary) hypertension: Secondary | ICD-10-CM

## 2019-03-05 LAB — LIPID PANEL
Cholesterol: 171 mg/dL (ref 0–200)
HDL: 63.6 mg/dL (ref >39.00)
LDL Cholesterol: 94 mg/dL (ref 0–99)
NonHDL: 107.52
Total CHOL/HDL Ratio: 3
Triglycerides: 66 mg/dL (ref 0.0–149.0)
VLDL: 13.2 mg/dL (ref 0.0–40.0)

## 2019-03-05 LAB — COMPREHENSIVE METABOLIC PANEL
ALT: 20 U/L (ref 0–35)
AST: 19 U/L (ref 0–37)
Albumin: 4.3 g/dL (ref 3.5–5.2)
Alkaline Phosphatase: 151 U/L — ABNORMAL HIGH (ref 39–117)
BUN: 8 mg/dL (ref 6–23)
CO2: 30 mEq/L (ref 19–32)
Calcium: 9.3 mg/dL (ref 8.4–10.5)
Chloride: 102 mEq/L (ref 96–112)
Creatinine, Ser: 0.8 mg/dL (ref 0.40–1.20)
GFR: 89.79 mL/min (ref >60.00)
Glucose, Bld: 87 mg/dL (ref 70–99)
Potassium: 3.8 mEq/L (ref 3.5–5.1)
Sodium: 139 mEq/L (ref 135–145)
Total Bilirubin: 0.5 mg/dL (ref 0.2–1.2)
Total Protein: 7.5 g/dL (ref 6.0–8.3)

## 2019-03-05 LAB — HEMOGLOBIN A1C: Hgb A1c MFr Bld: 5.6 % (ref 4.6–6.5)

## 2019-03-05 MED ORDER — DULERA 100-5 MCG/ACT IN AERO: 2.0000 | INHALATION_SPRAY | Freq: Two times a day (BID) | RESPIRATORY_TRACT | 5 refills | Status: DC

## 2019-03-05 NOTE — Assessment & Plan Note (Signed)
Well controlled, no changes to meds. Encouraged heart healthy diet such as the DASH diet and exercise as tolerated.  Slightly high today but pt states its better at home

## 2019-03-05 NOTE — Assessment & Plan Note (Signed)
con't albuterol  Restart qvar  rto prn

## 2019-03-05 NOTE — Patient Instructions (Signed)

## 2019-03-05 NOTE — Progress Notes (Signed)
Patient ID: Ruth [name], female    DOB: [birth year]  Age: 58 y.o. MRN: [id number]    Subjective:  Subjective  HPI Ruth [name] presents for f/u bp and c/o inc wheezing and allergies lately    bp running 120-140/ 73-90 at home  Pt admits to not eating right lately and had some more salt that usual She is using her albuterol more than normal and thinks she needs her dulera back.    Review of Systems  Constitutional: Negative for appetite change, diaphoresis, fatigue and unexpected weight change.  Eyes: Negative for pain, redness and visual disturbance.  Respiratory: Negative for cough, chest tightness, shortness of breath and wheezing.   Cardiovascular: Negative for chest pain, palpitations and leg swelling.  Endocrine: Negative for cold intolerance, heat intolerance, polydipsia, polyphagia and polyuria.  Genitourinary: Negative for difficulty urinating, dysuria and frequency.  Neurological: Negative for dizziness, light-headedness, numbness and headaches.    History Past Medical History:  Diagnosis Date  . Allergic rhinitis, cause unspecified   . Asthma   . Eczema   . Food allergy   . Gallbladder problem   . Headache   . Hypertension     She has a past surgical history that includes Cesarean section and Cesarean section (Bilateral, [id number]).   Her family history includes Allergies in her father and mother; Asthma in her father and mother; Drug abuse in her father.She reports that she has never smoked. She has never used smokeless tobacco. She reports current alcohol use. She reports that she does not use drugs.  Current Outpatient Medications on File Prior to Visit  Medication Sig Dispense Refill  . albuterol (VENTOLIN HFA) 108 (90 Base) MCG/ACT inhaler Inhale 2 puffs into the lungs every 6 (six) hours as needed for wheezing or shortness of breath. 18 g 1  . amLODipine (NORVASC) 5 MG tablet Take 1 tablet (5 mg total) by mouth daily. 30 tablet 0  . desonide (DESOWEN)  0.05 % lotion APPLY TO AFFECTED AREA TWICE DAILY 59 mL 2  . EPINEPHrine (EPI-PEN) 0.3 mg/0.3 mL SOAJ injection Inject into upper thigh for severe allergic reaction 1 Device prn  . fluticasone (FLONASE) 50 MCG/ACT nasal spray INSTILL 2 SPRAYS INTO EACH NOSTRIL EVERY DAY 16 g 2  . omeprazole (PRILOSEC) 40 MG capsule Take 1 capsule (40 mg total) by mouth daily. 30 capsule 3  . valACYclovir (VALTREX) 1000 MG tablet Take 1 tablet (1,000 mg total) by mouth 3 (three) times daily. 30 tablet 3  . Vitamin D, Ergocalciferol, (DRISDOL) 1.25 MG (50000 UT) CAPS capsule Take 1 capsule (50,000 Units total) by mouth every 7 (seven) days. 4 capsule 0   No current facility-administered medications on file prior to visit.      Objective:  Objective  Physical Exam Vitals signs and nursing note reviewed.  Constitutional:      Appearance: She is well-developed.  HENT:     Head: Normocephalic and atraumatic.  Eyes:     Conjunctiva/sclera: Conjunctivae normal.  Neck:     Musculoskeletal: Normal range of motion and neck supple.     Thyroid: No thyromegaly.     Vascular: No carotid bruit or JVD.  Cardiovascular:     Rate and Rhythm: Normal rate and regular rhythm.     Heart sounds: Normal heart sounds. No murmur.  Pulmonary:     Effort: Pulmonary effort is normal. No respiratory distress.     Breath sounds: Normal breath sounds. No wheezing or rales.  Chest:  Chest wall: No tenderness.  Neurological:     Mental Status: She is alert and oriented to person, place, and time.    BP 130/90 (BP Location: Right Arm, Patient Position: Sitting, Cuff Size: Large)   Pulse 91   Temp 97.8 F (36.6 C) (Temporal)   Resp 18   Ht 5\' 5"  (1.651 m)   Wt 226 lb 12.8 oz (102.9 kg)   LMP 09/19/2015   SpO2 98%   BMI 37.74 kg/m  Wt Readings from Last 3 Encounters:  03/05/19 226 lb 12.8 oz (102.9 kg)  01/19/19 227 lb 9.6 oz (103.2 kg)  10/05/18 223 lb (101.2 kg)     Lab Results  Component Value Date   WBC 5.3  04/13/2018   HGB 14.2 04/13/2018   HCT 43.1 04/13/2018   PLT 235 04/13/2018   GLUCOSE 87 10/05/2018   CHOL 174 10/05/2018   TRIG 63 10/05/2018   HDL 61 10/05/2018   LDLCALC 100 (H) 10/05/2018   ALT 26 10/05/2018   AST 18 10/05/2018   NA 144 10/05/2018   K 3.9 10/05/2018   CL 104 10/05/2018   CREATININE 0.85 10/05/2018   BUN 16 10/05/2018   CO2 24 10/05/2018   TSH 1.720 04/13/2018   HGBA1C 5.7 (H) 10/05/2018   MICROALBUR 1.1 07/03/2012    Ct Angio Head W/cm &/or Wo Cm  Result Date: 04/16/2015 CLINICAL DATA:  Abnormal MRI and. Anterior communicating artery aneurysm versus infundibulum. EXAM: CT ANGIOGRAPHY HEAD TECHNIQUE: Multidetector CT imaging of the head was performed using the standard protocol during bolus administration of intravenous contrast. Multiplanar CT image reconstructions and MIPs were obtained to evaluate the vascular anatomy. CONTRAST:  100 mL Isovue 370 COMPARISON:  MRA circle of Willis 04/04/2015 FINDINGS: CT HEAD Brain: Mild periventricular white matter hypoattenuation is again noted. No acute cortical infarct is present. The basal ganglia are intact. The ventricles are of normal size. No significant extra-axial fluid collection is present. Calvarium and skull base: The skullbase is within normal limits. Paranasal sinuses: The paranasal sinuses and mastoid air cells are clear. Orbits: Within normal limits. 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 No focal stenosis is evident through the skullbase. The ICA terminus is within normal limits. The left internal carotid artery is within normal limits to the ICA terminus. The left A1 segment feeds both ACA vessels with a patent anterior communicating artery. 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 There is no definitive vessel associated with this area at to identify this is an infundibulum. The MCA bifurcations are  within normal limits bilaterally. The ACA and MCA branch vessels are unremarkable. Posterior circulation: The vertebral arteries are codominant. Both PICA origins are visualized and normal. The basilar artery is normal. The posterior cerebral arteries both originate from the basilar tip. The PCA branch vessels are within normal limits. Venous sinuses: The dural sinuses are normally patent. The straight sinus and deep cerebral veins are intact. The left transverse sinus is dominant. The cortical veins are unremarkable. Anatomic variants: A prominent left posterior communicating artery is patent. Delayed phase:The postcontrast images demonstrate no pathologic enhancement. IMPRESSION: 1. 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 2. Otherwise normal CTA circle of Willis without focal stenosis, other aneurysm, or branch vessel occlusion. 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 Electronically Signed   By: San Morelle M.D.   On: 04/16/2015 11:44  Assessment & Plan:  Plan  I have discontinued Ruth [name]'s cetirizine. I am also having her start on Dulera. Additionally, I am having her maintain her EPINEPHrine, omeprazole, valACYclovir, desonide, Vitamin D (Ergocalciferol), fluticasone, albuterol, and amLODipine.  Meds ordered this encounter  Medications  . mometasone-formoterol (DULERA) 100-5 MCG/ACT AERO    Sig: Inhale 2 puffs into the lungs 2 (two) times daily.    Dispense:  13 g    Refill:  5    Problem List Items Addressed This Visit      Unprioritized   Class 1 obesity with serious comorbidity and body mass index (BMI) of 34.0 to 34.9 in adult    F/u healthy weight and wellness       Essential hypertension    Well controlled, no changes to meds. Encouraged heart healthy diet such as the DASH diet and exercise as tolerated.  Slightly high today but pt states its better at home       Relevant Orders   Lipid panel   Hemoglobin  A1c   Comprehensive metabolic panel   Moderate persistent asthma with acute exacerbation - Primary    con't albuterol  Restart qvar  rto prn       Relevant Medications   mometasone-formoterol (DULERA) 100-5 MCG/ACT AERO      Follow-up: No follow-ups on file.  Ann Held, DO

## 2019-03-05 NOTE — Assessment & Plan Note (Signed)
F/u healthy weight and wellness

## 2019-03-06 ENCOUNTER — Other Ambulatory Visit: Payer: Self-pay | Admitting: Family Medicine

## 2019-03-06 DIAGNOSIS — R748 Abnormal levels of other serum enzymes: Secondary | ICD-10-CM

## 2019-03-27 ENCOUNTER — Other Ambulatory Visit: Payer: Self-pay | Admitting: Family Medicine

## 2019-03-27 MED ORDER — AMLODIPINE BESYLATE 5 MG PO TABS: 5.0000 mg | ORAL_TABLET | Freq: Every day | ORAL | 1 refills | Status: DC

## 2019-03-27 NOTE — Telephone Encounter (Signed)
Medication Refill - Medication: amLODipine (NORVASC) 5 MG tablet   Preferred Pharmacy (with phone number or street name):  [id number] - [place], [place] - [address] AT [place] Phone:  [phone]  Fax:  [phone]       Agent: Please be advised that RX refills may take up to 3 business days. We ask that you follow-up with your pharmacy.'

## 2019-06-18 ENCOUNTER — Encounter: Payer: Self-pay | Admitting: Family Medicine

## 2019-06-18 ENCOUNTER — Ambulatory Visit: Payer: BC Managed Care – PPO | Admitting: Family Medicine

## 2019-06-18 ENCOUNTER — Other Ambulatory Visit: Payer: Self-pay

## 2019-06-18 VITALS — BP 144/81 | HR 84 | Temp 97.5°F | Resp 17 | Wt 227.1 lb

## 2019-06-18 DIAGNOSIS — R1011 Right upper quadrant pain: Secondary | ICD-10-CM

## 2019-06-18 DIAGNOSIS — I10 Essential (primary) hypertension: Secondary | ICD-10-CM

## 2019-06-18 DIAGNOSIS — R1013 Epigastric pain: Secondary | ICD-10-CM | POA: Diagnosis not present

## 2019-06-18 MED ORDER — HYDROCHLOROTHIAZIDE 25 MG PO TABS: 25.0000 mg | ORAL_TABLET | Freq: Every day | ORAL | 3 refills | Status: DC

## 2019-06-18 MED ORDER — PANTOPRAZOLE SODIUM 40 MG PO TBEC: 40.0000 mg | DELAYED_RELEASE_TABLET | Freq: Every day | ORAL | 3 refills | Status: DC

## 2019-06-18 NOTE — Patient Instructions (Signed)
Gallbladder Eating Plan If you have a gallbladder condition, you may have trouble digesting fats. Eating a low-fat diet can help reduce your symptoms, and may be helpful before and after having surgery to remove your gallbladder (cholecystectomy). Your health care provider may recommend that you work with a diet and nutrition specialist (dietitian) to help you reduce the amount of fat in your diet. What are tips for following this plan? General guidelines  Limit your fat intake to less than 30% of your total daily calories. If you eat around 1,800 calories each day, this is less than 60 grams (g) of fat per day.  Fat is an important part of a healthy diet. Eating a low-fat diet can make it hard to maintain a healthy body weight. Ask your dietitian how much fat, calories, and other nutrients you need each day.  Eat small, frequent meals throughout the day instead of three large meals.  Drink at least 8-10 cups of fluid a day. Drink enough fluid to keep your urine clear or pale yellow.  Limit alcohol intake to no more than 1 drink a day for nonpregnant women and 2 drinks a day for men. One drink equals 12 oz of beer, 5 oz of wine, or 1 oz of hard liquor. Reading food labels  Check Nutrition Facts on food labels for the amount of fat per serving. Choose foods with less than 3 grams of fat per serving. Shopping  Choose nonfat and low-fat healthy foods. Look for the words "nonfat," "low fat," or "fat free."  Avoid buying processed or prepackaged foods. Cooking  Cook using low-fat methods, such as baking, broiling, grilling, or boiling.  Cook with small amounts of healthy fats, such as olive oil, grapeseed oil, canola oil, or sunflower oil. What foods are recommended?   All fresh, frozen, or canned fruits and vegetables.  Whole grains.  Low-fat or non-fat (skim) milk and yogurt.  Lean meat, skinless poultry, fish, eggs, and beans.  Low-fat protein supplement powders or  drinks.  Spices and herbs. What foods are not recommended?  High-fat foods. These include baked goods, fast food, fatty cuts of meat, ice cream, french toast, sweet rolls, pizza, cheese bread, foods covered with butter, creamy sauces, or cheese.  Fried foods. These include french fries, tempura, battered fish, breaded chicken, fried breads, and sweets.  Foods with strong odors.  Foods that cause bloating and gas. Summary  A low-fat diet can be helpful if you have a gallbladder condition, or before and after gallbladder surgery.  Limit your fat intake to less than 30% of your total daily calories. This is about 60 g of fat if you eat 1,800 calories each day.  Eat small, frequent meals throughout the day instead of three large meals. This information is not intended to replace advice given to you by your health care provider. Make sure you discuss any questions you have with your health care provider. Document Revised: 07/06/2018 Document Reviewed: 04/22/2016 Elsevier Patient Education  2020 Elsevier Inc.  

## 2019-06-18 NOTE — Progress Notes (Signed)
Patient ID: Ruth [name], female    DOB: May 30, 1960  Age: 59 y.o. MRN: [id number]    Subjective:  Subjective  HPI Ruth [name] presents for RUQ pain and dyspepsia --- she had an [place] several years ago but did not want to see surgeon at that time   No NV She states -- pain is back and worsening-- she is also having a lot of gas and dyspepsia   Review of Systems  Constitutional: Negative for appetite change, diaphoresis, fatigue and unexpected weight change.  Eyes: Negative for pain, redness and visual disturbance.  Respiratory: Negative for cough, chest tightness, shortness of breath and wheezing.   Cardiovascular: Negative for chest pain, palpitations and leg swelling.  Gastrointestinal: Positive for abdominal pain. Negative for abdominal distention, anal bleeding, blood in stool, constipation, diarrhea, nausea and vomiting.  Endocrine: Negative for cold intolerance, heat intolerance, polydipsia, polyphagia and polyuria.  Genitourinary: Negative for difficulty urinating, dysuria and frequency.  Neurological: Negative for dizziness, light-headedness, numbness and headaches.    History Past Medical History:  Diagnosis Date  . Allergic rhinitis, cause unspecified   . Asthma   . Eczema   . Food allergy   . Gallbladder problem   . Headache   . Hypertension     She has a past surgical history that includes Cesarean section and Cesarean section (Bilateral, [id number]).   Her family history includes Allergies in her father and mother; Asthma in her father and mother; Drug abuse in her father.She reports that she has never smoked. She has never used smokeless tobacco. She reports current alcohol use. She reports that she does not use drugs.  Current Outpatient Medications on File Prior to Visit  Medication Sig Dispense Refill  . albuterol (VENTOLIN HFA) 108 (90 Base) MCG/ACT inhaler Inhale 2 puffs into the lungs every 6 (six) hours as needed for wheezing or shortness of breath. 18 g  1  . amLODipine (NORVASC) 5 MG tablet Take 1 tablet (5 mg total) by mouth daily. 90 tablet 1  . EPINEPHrine (EPI-PEN) 0.3 mg/0.3 mL SOAJ injection Inject into upper thigh for severe allergic reaction 1 Device prn  . fluticasone (FLONASE) 50 MCG/ACT nasal spray INSTILL 2 SPRAYS INTO EACH NOSTRIL EVERY DAY 16 g 2  . mometasone-formoterol (DULERA) 100-5 MCG/ACT AERO Inhale 2 puffs into the lungs 2 (two) times daily. 13 g 5  . valACYclovir (VALTREX) 1000 MG tablet Take 1 tablet (1,000 mg total) by mouth 3 (three) times daily. 30 tablet 3  . desonide (DESOWEN) 0.05 % lotion APPLY TO AFFECTED AREA TWICE DAILY 59 mL 2  . omeprazole (PRILOSEC) 40 MG capsule Take 1 capsule (40 mg total) by mouth daily. (Patient not taking: Reported on 06/18/2019) 30 capsule 3  . Vitamin D, Ergocalciferol, (DRISDOL) 1.25 MG (50000 UT) CAPS capsule Take 1 capsule (50,000 Units total) by mouth every 7 (seven) days. (Patient not taking: Reported on 06/18/2019) 4 capsule 0   No current facility-administered medications on file prior to visit.     Objective:  Objective  Physical Exam Vitals and nursing note reviewed.  Constitutional:      Appearance: She is well-developed.  HENT:     Head: Normocephalic and atraumatic.  Eyes:     Conjunctiva/sclera: Conjunctivae normal.  Neck:     Thyroid: No thyromegaly.     Vascular: No carotid bruit or JVD.  Cardiovascular:     Rate and Rhythm: Normal rate and regular rhythm.     Heart sounds: Normal heart sounds.  No murmur.  Pulmonary:     Effort: Pulmonary effort is normal. No respiratory distress.     Breath sounds: Normal breath sounds. No wheezing or rales.  Chest:     Chest wall: No tenderness.  Abdominal:     General: There is no distension.     Palpations: There is no mass.     Tenderness: There is abdominal tenderness in the right upper quadrant and epigastric area. There is no guarding or rebound.     Hernia: No hernia is present.  Musculoskeletal:     Cervical  back: Normal range of motion and neck supple.  Neurological:     Mental Status: She is alert and oriented to person, place, and time.    BP (!) 144/81 (BP Location: Left Arm, Patient Position: Sitting, Cuff Size: Large)   Pulse 84   Temp (!) 97.5 F (36.4 C) (Temporal)   Resp 17   Wt 227 lb 2 oz (103 kg)   LMP 09/19/2015   SpO2 100%   BMI 37.80 kg/m  Wt Readings from Last 3 Encounters:  06/18/19 227 lb 2 oz (103 kg)  03/05/19 226 lb 12.8 oz (102.9 kg)  01/19/19 227 lb 9.6 oz (103.2 kg)     Lab Results  Component Value Date   WBC 5.3 04/13/2018   HGB 14.2 04/13/2018   HCT 43.1 04/13/2018   PLT 235 04/13/2018   GLUCOSE 87 03/05/2019   CHOL 171 03/05/2019   TRIG 66.0 03/05/2019   HDL 63.60 03/05/2019   LDLCALC 94 03/05/2019   ALT 20 03/05/2019   AST 19 03/05/2019   NA 139 03/05/2019   K 3.8 03/05/2019   CL 102 03/05/2019   CREATININE 0.80 03/05/2019   BUN 8 03/05/2019   CO2 30 03/05/2019   TSH 1.720 04/13/2018   HGBA1C 5.6 03/05/2019   MICROALBUR 1.1 07/03/2012    CT Angio Head W/Cm &/Or Wo Cm  Result Date: 04/16/2015 CLINICAL DATA:  Abnormal MRI and. Anterior communicating artery aneurysm versus infundibulum. EXAM: CT ANGIOGRAPHY HEAD TECHNIQUE: Multidetector CT imaging of the head was performed using the standard protocol during bolus administration of intravenous contrast. Multiplanar CT image reconstructions and MIPs were obtained to evaluate the vascular anatomy. CONTRAST:  100 mL Isovue 370 COMPARISON:  MRA circle of Willis 04/04/2015 FINDINGS: CT HEAD Brain: Mild periventricular white matter hypoattenuation is again noted. No acute cortical infarct is present. The basal ganglia are intact. The ventricles are of normal size. No significant extra-axial fluid collection is present. Calvarium and skull base: The skullbase is within normal limits. Paranasal sinuses: The paranasal sinuses and mastoid air cells are clear. Orbits: Within normal limits. CTA HEAD Anterior  circulation: The right internal carotid artery is hypoplastic compared to the left, as previously discussed. No focal stenosis is evident through the skullbase. The ICA terminus is within normal limits. The left internal carotid artery is within normal limits to the ICA terminus. The left A1 segment feeds both ACA vessels with a patent anterior communicating artery. 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 There is no definitive vessel associated with this area at to identify this is an infundibulum. The MCA bifurcations are within normal limits bilaterally. The ACA and MCA branch vessels are unremarkable. Posterior circulation: The vertebral arteries are codominant. Both PICA origins are visualized and normal. The basilar artery is normal. The posterior cerebral arteries both originate from the basilar tip. The PCA branch vessels are within normal limits. Venous sinuses:  The dural sinuses are normally patent. The straight sinus and deep cerebral veins are intact. The left transverse sinus is dominant. The cortical veins are unremarkable. Anatomic variants: A prominent left posterior communicating artery is patent. Delayed phase:The postcontrast images demonstrate no pathologic enhancement. IMPRESSION: 1. 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 2. Otherwise normal CTA circle of Willis without focal stenosis, other aneurysm, or branch vessel occlusion. 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 Electronically Signed   By: San Morelle M.D.   On: 04/16/2015 11:44     Assessment & Plan:  Plan  I am having Camella M. Gama start on pantoprazole and hydrochlorothiazide. I am also having her maintain her EPINEPHrine, omeprazole, valACYclovir, desonide, Vitamin D (Ergocalciferol), fluticasone, albuterol, Dulera, and amLODipine.  Meds ordered this encounter  Medications  . pantoprazole (PROTONIX) 40 MG tablet    Sig: Take 1 tablet  (40 mg total) by mouth daily.    Dispense:  30 tablet    Refill:  3  . hydrochlorothiazide (HYDRODIURIL) 25 MG tablet    Sig: Take 1 tablet (25 mg total) by mouth daily.    Dispense:  90 tablet    Refill:  3    Problem List Items Addressed This Visit      Unprioritized   Dyspepsia    protonix daily Check labs       Relevant Medications   pantoprazole (PROTONIX) 40 MG tablet   Essential hypertension    Poorly controlled will alter medications, encouraged DASH diet, minimize caffeine and obtain adequate sleep. Report concerning symptoms and follow up as directed and as needed      Relevant Medications   hydrochlorothiazide (HYDRODIURIL) 25 MG tablet   RUQ pain - Primary    Check [place] today Refer to surgery if + Check labs       Relevant Orders   CBC with Differential/Platelet   Comprehensive metabolic panel   H. pylori antibody, IgG   US Abdomen Limited RUQ      Follow-up: Return in about 3 weeks (around 07/09/2019), or if symptoms worsen or fail to improve, for hypertension.  Ann Held, DO

## 2019-06-18 NOTE — Assessment & Plan Note (Signed)
protonix daily Check labs   

## 2019-06-18 NOTE — Assessment & Plan Note (Signed)
Poorly controlled will alter medications, encouraged DASH diet, minimize caffeine and obtain adequate sleep. Report concerning symptoms and follow up as directed and as needed 

## 2019-06-18 NOTE — Assessment & Plan Note (Signed)
Check [place] today Refer to surgery if + Check labs

## 2019-06-19 LAB — COMPREHENSIVE METABOLIC PANEL
ALT: 15 U/L (ref 0–35)
AST: 15 U/L (ref 0–37)
Albumin: 4.1 g/dL (ref 3.5–5.2)
Alkaline Phosphatase: 127 U/L — ABNORMAL HIGH (ref 39–117)
BUN: 13 mg/dL (ref 6–23)
CO2: 29 mEq/L (ref 19–32)
Calcium: 9.1 mg/dL (ref 8.4–10.5)
Chloride: 104 mEq/L (ref 96–112)
Creatinine, Ser: 0.83 mg/dL (ref 0.40–1.20)
GFR: 85.96 mL/min (ref >60.00)
Glucose, Bld: 92 mg/dL (ref 70–99)
Potassium: 3.4 mEq/L — ABNORMAL LOW (ref 3.5–5.1)
Sodium: 139 mEq/L (ref 135–145)
Total Bilirubin: 0.4 mg/dL (ref 0.2–1.2)
Total Protein: 7.1 g/dL (ref 6.0–8.3)

## 2019-06-19 LAB — CBC WITH DIFFERENTIAL/PLATELET
Basophils Absolute: 0 10*3/uL (ref 0.0–0.1)
Basophils Relative: 0.9 % (ref 0.0–3.0)
Eosinophils Absolute: 0.1 10*3/uL (ref 0.0–0.7)
Eosinophils Relative: 1.8 % (ref 0.0–5.0)
HCT: 40.2 % (ref 36.0–46.0)
Hemoglobin: 13.3 g/dL (ref 12.0–15.0)
Lymphocytes Relative: 32.2 % (ref 12.0–46.0)
Lymphs Abs: 1.7 10*3/uL (ref 0.7–4.0)
MCHC: 33.2 g/dL (ref 30.0–36.0)
MCV: 85.6 fl (ref 78.0–100.0)
Monocytes Absolute: 0.3 10*3/uL (ref 0.1–1.0)
Monocytes Relative: 5.6 % (ref 3.0–12.0)
Neutro Abs: 3.2 10*3/uL (ref 1.4–7.7)
Neutrophils Relative %: 59.5 % (ref 43.0–77.0)
Platelets: 211 10*3/uL (ref 150.0–400.0)
RBC: 4.7 Mil/uL (ref 3.87–5.11)
RDW: 14.2 % (ref 11.5–15.5)
WBC: 5.3 10*3/uL (ref 4.0–10.5)

## 2019-06-19 LAB — H. PYLORI ANTIBODY, IGG: H Pylori IgG: NEGATIVE

## 2019-06-22 ENCOUNTER — Ambulatory Visit (HOSPITAL_BASED_OUTPATIENT_CLINIC_OR_DEPARTMENT_OTHER): Payer: BC Managed Care – PPO

## 2019-06-28 ENCOUNTER — Other Ambulatory Visit: Payer: Self-pay

## 2019-06-28 ENCOUNTER — Ambulatory Visit (HOSPITAL_BASED_OUTPATIENT_CLINIC_OR_DEPARTMENT_OTHER)
Admission: RE | Admit: 2019-06-28 | Discharge: 2019-06-28 | Disposition: A | Discharge status: Home / Self Care | Payer: BC Managed Care – PPO | Source: Ambulatory Visit | Attending: Family Medicine | Admitting: Family Medicine

## 2019-06-28 DIAGNOSIS — K8020 Calculus of gallbladder without cholecystitis without obstruction: Secondary | ICD-10-CM | POA: Diagnosis not present

## 2019-06-28 DIAGNOSIS — R1011 Right upper quadrant pain: Secondary | ICD-10-CM | POA: Insufficient documentation

## 2019-07-10 ENCOUNTER — Ambulatory Visit: Payer: BC Managed Care – PPO | Admitting: Neurology

## 2019-07-10 ENCOUNTER — Other Ambulatory Visit: Payer: Self-pay

## 2019-07-10 ENCOUNTER — Encounter: Payer: Self-pay | Admitting: Neurology

## 2019-07-10 VITALS — BP 125/80 | HR 79 | Temp 97.0°F | Ht 65.0 in | Wt 228.5 lb

## 2019-07-10 DIAGNOSIS — I671 Cerebral aneurysm, nonruptured: Secondary | ICD-10-CM

## 2019-07-10 DIAGNOSIS — R9082 White matter disease, unspecified: Secondary | ICD-10-CM | POA: Diagnosis not present

## 2019-07-10 DIAGNOSIS — I10 Essential (primary) hypertension: Secondary | ICD-10-CM

## 2019-07-10 DIAGNOSIS — G43009 Migraine without aura, not intractable, without status migrainosus: Secondary | ICD-10-CM | POA: Diagnosis not present

## 2019-07-10 MED ORDER — TOPIRAMATE 50 MG PO TABS: ORAL_TABLET | ORAL | 11 refills | Status: DC

## 2019-07-10 NOTE — Progress Notes (Signed)
[facility]  PATIENT: BRAD FALARDEAU DOB: 05-Apr-1960  REFERRING DOCTOR OR PCP:  Garnet Koyanagi SOURCE: patient and records and MRI images on PACS  _________________________________   HISTORICAL  CHIEF COMPLAINT:  Chief Complaint  Patient presents with  . Follow-up    RM 12, alone. Last seen 07/04/2018. Here to f/u on migraines. States migraines have improved.     HISTORY OF PRESENT ILLNESS:  Lusila Matsuo is a 59 yo woman with headaches, AComm aneurysm and white matter changes on MRI.  Update 07/10/2019: She is having about 10-12 migraines a month.  They are located on the right side or both sides.  She gets a pressure sensation in her head.   She has nausea (usually mild) an photophobia and phonophobia.   She takes Motrin or Goody's usually with benefit.    Her BP has done well lately (125/80 today and 140/80 recently).   She is on amlodipine.  She has HCTZ but has not taken recently.     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    If she bends down an gets back up she feels lightheaded for a few seconds.    Update 07/04/2018:(virtual) She feels she is doing well.  She has not had many headaches.   Her BP has done well and she is only on Norvasc 5 mg now.    She does not have any side effects and no lightheadedness.  She notes losing 17 pounds over the last year which is helped her blood pressure.  She also feels better in general.  She has not had the floaters much in the left eye as she did last year.  Neurologically, gait, strength, sensation and coordination are doing well.  I personally reviewed the MRI reports and CTA reports from 2016 and 2017 and also looked at the images.  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  The white matter changes are much more consistent with chronic microvascular ischemic  change than with demyelination.  Her last aneurysm imaging study is 2017 and we discussed the need to repeat sometime this year.  Due to the COVID-19 crisis we will put this off a couple months in the hope that imaging will be safer at that time.   Observations/Objective: She is a well-developed well-nourished woman in no acute distress.  The head is normocephalic and atraumatic.  Visible skin appears normal.  She is alert and fully oriented with fluent speech and good attention, focus and memory.  Extraocular muscles appear normal.  Facial strength and trapezius strength was normal.  Arm strength appeared normal and rapid alternating movements were performed well.  Finger-nose-finger was performed well.  Sensation, leg strength, gait and reflexes could not be evaluated today.  Assessment and Plan: No diagnosis found.  1.   In order to determine if the aneurysm is stable, we will repeat an MRA in a couple months.  Hopefully this will be safer at that time. 2.   Continue amlodipine 5 mg.  If she loses more weight and blood pressure drops, we could consider stopping this. 3.   She will return to see me in 1 year or sooner if new or worsening symptoms.  Follow Up Instructions: I discussed the assessment and treatment plan with the patient. The patient was provided an  opportunity to ask questions and all were answered. The patient agreed with the plan and demonstrated an understanding of the instructions.    The patient was advised to call back or seek an in-person evaluation if the symptoms worsen or if the condition fails to improve as anticipated.  I provided 20 minutes of non-face-to-face time during this encounter.   ____________________________________ From previous notes Update 02/21/2017:   She feels mostly stable since the last visit though the left eye has more light sensitivity.   Vision is occ blurry out of that eye but not always.    She has had floaters in that eye.    Headaches are  doing well.    Recent headaches seemed allergy related and she took Zyrtec.   She is having a HA once a week, usually across her forehead.   Left is usually worse than right.     Moving and bright lights worsen the pain and resting and Zyrtec help.      Her HTN is doing ok.    She needed to switch from valsartan to losartan.     She has not had many more dizzy spells. She does feel her gait is a little off balance at times.  RI and MRA were reviewed. She has some white matter changes, mostly in the subcortical white matter. She also has a small lacune in the left cerebellar hemisphere that appears chronic.   MRA shows a small 2 mm outpouching (left Ant comm. Art.) that could be an enlarged infundibulum or a small aneurysm.   From 02/18/2016:     Abnl MRI and aneurysm: MRI 03/11/2015 showed mild chronic microvascular ischemic changes, mostly subcortical.  MR angiogram 04/04/2015 showed a 2 mm outpouching anterior communicating artery region that could be an aneurysm or an infundibulum.   CT angiogram was then performed showing a 1.5 mm ACA aneurysm with a narrow neck.  HA:   She is only getting rare headaches now.    She has had only one bad headache since starting Diovan.  She took a Goody's and it improved.   She often gets a duller pressure like headache that she feels are due to her sinuses.    The moderate headache was right sided and throbbing with nausea, photophobia.  She generally does not get visual or other aura before the headache. A typical headache will last about half a day and then resolve.    Advil or goodys will usually knock it out.      HTN:  She is doing much better on Diovan and last BP was 126/78.    Dizziness:   She had one episode of dizziness last month while on a stool.  This one lasted a few minutes only.   Over the past year she has had a couple episodes of dizziness lasting about 2 hours. During that time she feels off balanced. The vertigo was more translational than  spinning. There was not lightheadedness.    Vascular risks:   She has HTN, now better controlled.   She does not have diabetes. She does not smoke.   She was never a smoker as she has asthma.   She denies any diagnosis heart problem. She has felt that her heart skips a beat at times.    REVIEW OF SYSTEMS: Constitutional: No fevers, chills, sweats, or change in appetite Eyes: No visual changes, double vision, eye pain Ear, nose and throat: No hearing loss, ear pain, nasal congestion, sore throat Cardiovascular:  No chest pain, palpitations Respiratory: No shortness of breath at rest or with exertion.   No wheezes GastrointestinaI: No nausea, vomiting, diarrhea, abdominal pain, fecal incontinence Genitourinary: No dysuria, urinary retention or frequency.  No nocturia. Musculoskeletal: No neck pain, back pain Integumentary: No rash, pruritus, skin lesions Neurological: as above Psychiatric: No depression at this time.  No anxiety Endocrine: No palpitations, diaphoresis, change in appetite, change in weigh or increased thirst Hematologic/Lymphatic: No anemia, purpura, petechiae. Allergic/Immunologic: No itchy/runny eyes, nasal congestion, recent allergic reactions, rashes  ALLERGIES: Allergies  Allergen Reactions  . Other     Nuts     HOME MEDICATIONS:  Current Outpatient Medications:  .  albuterol (VENTOLIN HFA) 108 (90 Base) MCG/ACT inhaler, Inhale 2 puffs into the lungs every 6 (six) hours as needed for wheezing or shortness of breath., Disp: 18 g, Rfl: 1 .  amLODipine (NORVASC) 5 MG tablet, Take 1 tablet (5 mg total) by mouth daily., Disp: 90 tablet, Rfl: 1 .  EPINEPHrine (EPI-PEN) 0.3 mg/0.3 mL SOAJ injection, Inject into upper thigh for severe allergic reaction, Disp: 1 Device, Rfl: prn .  fluticasone (FLONASE) 50 MCG/ACT nasal spray, INSTILL 2 SPRAYS INTO EACH NOSTRIL EVERY DAY, Disp: 16 g, Rfl: 2 .  hydrochlorothiazide (HYDRODIURIL) 25 MG tablet, Take 1 tablet (25 mg total)  by mouth daily., Disp: 90 tablet, Rfl: 3 .  mometasone-formoterol (DULERA) 100-5 MCG/ACT AERO, Inhale 2 puffs into the lungs 2 (two) times daily., Disp: 13 g, Rfl: 5 .  pantoprazole (PROTONIX) 40 MG tablet, Take 1 tablet (40 mg total) by mouth daily., Disp: 30 tablet, Rfl: 3  PAST MEDICAL HISTORY: Past Medical History:  Diagnosis Date  . Allergic rhinitis, cause unspecified   . Asthma   . Eczema   . Food allergy   . Gallbladder problem   . Headache   . Hypertension     PAST SURGICAL HISTORY: Past Surgical History:  Procedure Laterality Date  . CESAREAN SECTION    . CESAREAN SECTION Bilateral [id number]    FAMILY HISTORY: Family History  Problem Relation Age of Onset  . Asthma Mother   . Allergies Mother   . Asthma Father   . Drug abuse Father   . Allergies Father     SOCIAL HISTORY:  Social History   Socioeconomic History  . Marital status: Single    Spouse name: Not on file  . Number of children: Not on file  . Years of education: Not on file  . Highest education level: Not on file  Occupational History  . Occupation: Scientist, forensic    Comment: guard   Tobacco Use  . Smoking status: Never Smoker  . Smokeless tobacco: Never Used  Substance and Sexual Activity  . Alcohol use: Yes    Alcohol/week: 0.0 standard drinks    Comment: one glass of wine per week/fim  . Drug use: No  . Sexual activity: Yes    Partners: Male  Other Topics Concern  . Not on file  Social History Narrative   Exercise--no   Social Determinants of Health   Financial Resource Strain:   . Difficulty of Paying Living Expenses:   Food Insecurity:   . Worried About Charity fundraiser in the Last Year:   . Arboriculturist in the Last Year:   Transportation Needs:   . Film/video editor (Medical):   Marland Kitchen Lack of Transportation (Non-Medical):   Physical Activity:   . Days of Exercise per Week:   . Minutes  of Exercise per Session:   Stress:   . Feeling of Stress :   Social  Connections:   . Frequency of Communication with Friends and Family:   . Frequency of Social Gatherings with Friends and Family:   . Attends Religious Services:   . Active Member of Clubs or Organizations:   . Attends Archivist Meetings:   Marland Kitchen Marital Status:   Intimate Partner Violence:   . Fear of Current or Ex-Partner:   . Emotionally Abused:   Marland Kitchen Physically Abused:   . Sexually Abused:      PHYSICAL EXAM  Vitals:   07/10/19 0811  BP: 125/80  Pulse: 79  Temp: (!) 97 F (36.1 C)  SpO2: 97%  Weight: 228 lb 8 oz (103.6 kg)  Height: 5\' 5"  (1.651 m)    Body mass index is 38.02 kg/m.   General: The patient is well-developed and well-nourished and in no acute distress.  The neck is nontender.  Range of motion is normal   Neurologic Exam  Mental status: The patient is alert and oriented x 3 at the time of the examination. The patient has apparent normal recent and remote memory, with an apparently normal attention span and concentration ability.   Speech is normal.  Cranial nerves: Extraocular movements are full.  Facial strength and sensation is normal. Trapezius strength is strong. There is no dysarthria.    No obvious hearing deficits are noted.  Motor:  Muscle bulk is normal.   Tone is normal. Strength is  5 / 5 in all 4 extremities.   Sensory: She has intact sensation to touch and vibration in the arms and legs.  Coordination: Cerebellar testing reveals good finger-nose-finger and heel-to-shin bilaterally.  Gait and station: Station is normal.   Gait is normal. The tandem gait is mildly wide.. Romberg is negative.   Reflexes: Deep tendon reflexes are symmetric and normal bilaterally.       DIAGNOSTIC DATA (LABS, IMAGING, TESTING) - I reviewed patient records, labs, notes, testing and imaging myself where available.  Lab Results  Component Value Date   WBC 5.3 06/18/2019   HGB 13.3 06/18/2019   HCT 40.2 06/18/2019   MCV 85.6 06/18/2019   PLT 211.0  06/18/2019      Component Value Date/Time   NA 139 06/18/2019 1528   NA 144 10/05/2018 1004   K 3.4 (L) 06/18/2019 1528   CL 104 06/18/2019 1528   CO2 29 06/18/2019 1528   GLUCOSE 92 06/18/2019 1528   BUN 13 06/18/2019 1528   BUN 16 10/05/2018 1004   CREATININE 0.83 06/18/2019 1528   CREATININE 0.82 08/24/2013 1633   CALCIUM 9.1 06/18/2019 1528   PROT 7.1 06/18/2019 1528   PROT 7.2 10/05/2018 1004   ALBUMIN 4.1 06/18/2019 1528   ALBUMIN 4.3 10/05/2018 1004   AST 15 06/18/2019 1528   ALT 15 06/18/2019 1528   ALKPHOS 127 (H) 06/18/2019 1528   BILITOT 0.4 06/18/2019 1528   BILITOT 0.3 10/05/2018 1004   GFRNONAA 77 10/05/2018 1004   GFRAA 89 10/05/2018 1004   Lab Results  Component Value Date   CHOL 171 03/05/2019   HDL 63.60 03/05/2019   LDLCALC 94 03/05/2019   TRIG 66.0 03/05/2019   CHOLHDL 3 03/05/2019   Lab Results  Component Value Date   HGBA1C 5.6 03/05/2019   Lab Results  Component Value Date   [id number] 07/21/2017   Lab Results  Component Value Date   TSH 1.720 04/13/2018  ___________________________________________  Migraine without aura and without status migrainosus, not intractable - Plan: MR BRAIN WO CONTRAST, MR ANGIO HEAD WO CONTRAST  Aneurysm, cerebral, nonruptured - Plan: MR BRAIN WO CONTRAST, MR ANGIO HEAD WO CONTRAST  White matter abnormality on MRI of brain - Plan: MR BRAIN WO CONTRAST  Essential hypertension   1.  We need to check an MRI of the brain and MR angiogram of the head to follow-up with the intracranial aneurysm (ACA) and the white matter changes that have been seen in the past. 2.   Topamax for migraines. 3.    Return in 6 months or sooner if there are new or worsening neurologic symptoms. Patric Vanpelt A. Felecia Shelling, MD, PhD [id number], [id number] AM Certified in Neurology, Clinical Neurophysiology, Sleep Medicine, Pain Medicine and Neuroimaging  [facility] [address], [place], [place] [address] [phone]

## 2019-07-11 ENCOUNTER — Telehealth: Payer: Self-pay | Admitting: Neurology

## 2019-07-11 NOTE — Telephone Encounter (Signed)
no to the covid questions MR Brain wo contrast & MRA Head wo contrast Dr. Cheree Ditto Auth: [id number] (exp. 07/11/19 to 01/06/20). patient is scheduled at [facility] for 07/17/19

## 2019-07-12 ENCOUNTER — Encounter: Payer: Self-pay | Admitting: Family Medicine

## 2019-07-13 ENCOUNTER — Other Ambulatory Visit: Payer: Self-pay

## 2019-07-13 ENCOUNTER — Telehealth (INDEPENDENT_AMBULATORY_CARE_PROVIDER_SITE_OTHER): Payer: BC Managed Care – PPO | Admitting: Family Medicine

## 2019-07-13 ENCOUNTER — Encounter: Payer: Self-pay | Admitting: Family Medicine

## 2019-07-13 VITALS — Temp 97.1°F | Ht 64.5 in | Wt 227.0 lb

## 2019-07-13 DIAGNOSIS — J0140 Acute pansinusitis, unspecified: Secondary | ICD-10-CM | POA: Diagnosis not present

## 2019-07-13 MED ORDER — PREDNISONE 10 MG PO TABS: ORAL_TABLET | ORAL | 0 refills | Status: DC

## 2019-07-13 MED ORDER — AMOXICILLIN-POT CLAVULANATE 875-125 MG PO TABS: 1.0000 | ORAL_TABLET | Freq: Two times a day (BID) | ORAL | 0 refills | Status: DC

## 2019-07-13 NOTE — Progress Notes (Signed)
Virtual Visit via Video Note  I connected with Ruth [name] on 07/13/19 at 10:40 AM EDT by a video enabled telemedicine application and verified that I am speaking with the correct person using two identifiers.  Location: Patient: home alone  Provider: office    I discussed the limitations of evaluation and management by telemedicine and the availability of in person appointments. The patient expressed understanding and agreed to proceed.  History of Present Illness: Pt is home c/o sinus pressure / teeth pain and ear pain --- she took alka seltzer cold and allergy and it helped If she bends down she feels pressure  Symptoms started Tuesday.    Observations/Objective: Vitals:   07/13/19 0954  Temp: (!) 97.1 F (36.2 C)   120/80 Pt is in nad    Assessment and Plan: 1. Acute non-recurrent pansinusitis flonase and antihstamine con't abx per orders and pred taper Call or rto prn  - amoxicillin-clavulanate (AUGMENTIN) 875-125 MG tablet; Take 1 tablet by mouth 2 (two) times daily.  Dispense: 20 tablet; Refill: 0 - predniSONE (DELTASONE) 10 MG tablet; TAKE 3 TABLETS PO QD FOR 3 DAYS THEN TAKE 2 TABLETS PO QD FOR 3 DAYS THEN TAKE 1 TABLET PO QD FOR 3 DAYS THEN TAKE 1/2 TAB PO QD FOR 3 DAYS  Dispense: 20 tablet; Refill: 0  Follow Up Instructions:    I discussed the assessment and treatment plan with the patient. The patient was provided an opportunity to ask questions and all were answered. The patient agreed with the plan and demonstrated an understanding of the instructions.   The patient was advised to call back or seek an in-person evaluation if the symptoms worsen or if the condition fails to improve as anticipated.  I provided 25 minutes of non-face-to-face time during this encounter.   Ann Held, DO

## 2019-07-17 ENCOUNTER — Telehealth: Payer: Self-pay | Admitting: Neurology

## 2019-07-17 ENCOUNTER — Other Ambulatory Visit: Payer: BC Managed Care – PPO

## 2019-07-17 NOTE — Telephone Encounter (Signed)
Pt called stating that she is having a headache that makes it feel like her face hurts and the pain goes down her neck and back all the way down to her buttocks. She would like to discuss with RN to see how this can be alleviated. Please advise.

## 2019-07-17 NOTE — Telephone Encounter (Signed)
I recommend she start the topamax.   Since it takes a while to kick in, we can also send in a steroid pack which may help her current pain

## 2019-07-17 NOTE — Telephone Encounter (Signed)
Called pt back. She is agreeable to start topamax as prescribed by Dr. Felecia Shelling. She will finish out the predisone taper and amoxicillin provided by her PCP. She will call back to get her MRI's rescheduled as well. Advised we will call her with the results once those are back. She verbalized understanding.

## 2019-07-17 NOTE — Telephone Encounter (Signed)
Called patient. She thought she was having sinus issues and this is what was causing pain. She did VV with PCP 07/13/19 and was given prednisone/amoxicillin. She is still on these. She felt better Sat/Sun. Sunday night, she rolled over and got pain in her neck which made her head hurt. She got nauseous and threw up. Took goody powder at 4am today which helped and she wass able to go to sleep. She woke up later and herneck is still hurting. Reports shoulder pain that  radiates to the top of her buttocks. The back of her legs hurt as well. She did do some yard work this past weekend which may have caused this. She has hx sciatic nerve pain. She was supposed to have MRI's yesterday but she did not feel well. She was told to call back to reschedule once she was feeling better. She has not started topamax, was hesitant to start due potential SE. She was worried that she would have to taper off if she did not tolerate well.  I explained medication and its use again.  Explained how we would taper her off if need be. Denies any exposure to anyone with covid-19 recently. She wants to know what Dr. Felecia Shelling recommends. Advised I will message him and call back.

## 2019-07-19 ENCOUNTER — Encounter: Payer: Self-pay | Admitting: Family Medicine

## 2019-07-19 ENCOUNTER — Telehealth (INDEPENDENT_AMBULATORY_CARE_PROVIDER_SITE_OTHER): Payer: BC Managed Care – PPO | Admitting: Family Medicine

## 2019-07-19 DIAGNOSIS — R112 Nausea with vomiting, unspecified: Secondary | ICD-10-CM

## 2019-07-19 MED ORDER — ONDANSETRON 4 MG PO TBDP: 4.0000 mg | ORAL_TABLET | Freq: Three times a day (TID) | ORAL | 0 refills | Status: DC | PRN

## 2019-07-19 NOTE — Progress Notes (Signed)
Virtual Visit via Video Note  I connected with Ruth [name] on 07/19/19 at  1:40 PM EDT by a video enabled telemedicine application and verified that I am speaking with the correct person using two identifiers.  Location: Patient: home alone  Provider: office    I discussed the limitations of evaluation and management by telemedicine and the availability of in person appointments. The patient expressed understanding and agreed to proceed.  History of Present Illness:  pt is home c/o nausea and vomiting x last several days.  She stopped the augmentin yesterday and is still nauseous today No more vomiting  Observations/Objective: There were no vitals filed for this visit. No fevers  Assessment and Plan: 1. Nausea and vomiting, intractability of vomiting not specified, unspecified vomiting type prob from abx Pt stopped it yesterday  Take zofran prn If vomiting con't --- go to ER  - ondansetron (ZOFRAN ODT) 4 MG disintegrating tablet; Take 1 tablet (4 mg total) by mouth every 8 (eight) hours as needed for nausea or vomiting.  Dispense: 20 tablet; Refill: 0   Follow Up Instructions:    I discussed the assessment and treatment plan with the patient. The patient was provided an opportunity to ask questions and all were answered. The patient agreed with the plan and demonstrated an understanding of the instructions.   The patient was advised to call back or seek an in-person evaluation if the symptoms worsen or if the condition fails to improve as anticipated.  I provided 25 minutes of non-face-to-face time during this encounter.   Ann Held, DO

## 2019-07-20 NOTE — Telephone Encounter (Signed)
Pt called back stating she is still having headaches, hard time eating, back and face pain and has not been able to go to work. Please advise.

## 2019-07-21 ENCOUNTER — Encounter (HOSPITAL_BASED_OUTPATIENT_CLINIC_OR_DEPARTMENT_OTHER): Payer: Self-pay

## 2019-07-21 ENCOUNTER — Other Ambulatory Visit: Payer: Self-pay

## 2019-07-21 ENCOUNTER — Emergency Department (HOSPITAL_BASED_OUTPATIENT_CLINIC_OR_DEPARTMENT_OTHER): Payer: BC Managed Care – PPO

## 2019-07-21 ENCOUNTER — Emergency Department (HOSPITAL_BASED_OUTPATIENT_CLINIC_OR_DEPARTMENT_OTHER)
Admission: EM | Admit: 2019-07-21 | Discharge: 2019-07-21 | Disposition: A | Discharge status: Other Acute Inpatient Hospital | Payer: BC Managed Care – PPO | Attending: Emergency Medicine | Admitting: Emergency Medicine

## 2019-07-21 DIAGNOSIS — I607 Nontraumatic subarachnoid hemorrhage from unspecified intracranial artery: Secondary | ICD-10-CM | POA: Diagnosis not present

## 2019-07-21 DIAGNOSIS — I602 Nontraumatic subarachnoid hemorrhage from anterior communicating artery: Secondary | ICD-10-CM | POA: Diagnosis not present

## 2019-07-21 DIAGNOSIS — Z91018 Allergy to other foods: Secondary | ICD-10-CM | POA: Diagnosis not present

## 2019-07-21 DIAGNOSIS — I615 Nontraumatic intracerebral hemorrhage, intraventricular: Secondary | ICD-10-CM | POA: Diagnosis not present

## 2019-07-21 DIAGNOSIS — J45909 Unspecified asthma, uncomplicated: Secondary | ICD-10-CM | POA: Diagnosis not present

## 2019-07-21 DIAGNOSIS — G43909 Migraine, unspecified, not intractable, without status migrainosus: Secondary | ICD-10-CM | POA: Insufficient documentation

## 2019-07-21 DIAGNOSIS — M542 Cervicalgia: Secondary | ICD-10-CM | POA: Diagnosis not present

## 2019-07-21 DIAGNOSIS — Z79899 Other long term (current) drug therapy: Secondary | ICD-10-CM | POA: Insufficient documentation

## 2019-07-21 DIAGNOSIS — I609 Nontraumatic subarachnoid hemorrhage, unspecified: Secondary | ICD-10-CM | POA: Diagnosis not present

## 2019-07-21 DIAGNOSIS — I10 Essential (primary) hypertension: Secondary | ICD-10-CM | POA: Insufficient documentation

## 2019-07-21 DIAGNOSIS — Z03818 Encounter for observation for suspected exposure to other biological agents ruled out: Secondary | ICD-10-CM | POA: Diagnosis not present

## 2019-07-21 DIAGNOSIS — D72829 Elevated white blood cell count, unspecified: Secondary | ICD-10-CM | POA: Diagnosis not present

## 2019-07-21 DIAGNOSIS — K8020 Calculus of gallbladder without cholecystitis without obstruction: Secondary | ICD-10-CM | POA: Diagnosis not present

## 2019-07-21 DIAGNOSIS — R519 Headache, unspecified: Secondary | ICD-10-CM | POA: Diagnosis not present

## 2019-07-21 DIAGNOSIS — I6603 Occlusion and stenosis of bilateral middle cerebral arteries: Secondary | ICD-10-CM | POA: Diagnosis not present

## 2019-07-21 DIAGNOSIS — K219 Gastro-esophageal reflux disease without esophagitis: Secondary | ICD-10-CM | POA: Diagnosis not present

## 2019-07-21 DIAGNOSIS — Z20822 Contact with and (suspected) exposure to covid-19: Secondary | ICD-10-CM | POA: Diagnosis not present

## 2019-07-21 DIAGNOSIS — I671 Cerebral aneurysm, nonruptured: Secondary | ICD-10-CM | POA: Diagnosis not present

## 2019-07-21 DIAGNOSIS — I608 Other nontraumatic subarachnoid hemorrhage: Secondary | ICD-10-CM | POA: Diagnosis not present

## 2019-07-21 DIAGNOSIS — M5489 Other dorsalgia: Secondary | ICD-10-CM | POA: Diagnosis not present

## 2019-07-21 LAB — RESPIRATORY PANEL BY RT PCR (FLU A&B, COVID)
Influenza A by PCR: NEGATIVE
Influenza B by PCR: NEGATIVE
SARS Coronavirus 2 by RT PCR: NEGATIVE

## 2019-07-21 LAB — COMPREHENSIVE METABOLIC PANEL
ALT: 27 U/L (ref 0–44)
AST: 20 U/L (ref 15–41)
Albumin: 3.7 g/dL (ref 3.5–5.0)
Alkaline Phosphatase: 107 U/L (ref 38–126)
Anion gap: 11 (ref 5–15)
BUN: 18 mg/dL (ref 6–20)
CO2: 23 mmol/L (ref 22–32)
Calcium: 8.9 mg/dL (ref 8.9–10.3)
Chloride: 100 mmol/L (ref 98–111)
Creatinine, Ser: 1.11 mg/dL — ABNORMAL HIGH (ref 0.44–1.00)
GFR calc Af Amer: >60 mL/min (ref >60)
GFR calc non Af Amer: 55 mL/min — ABNORMAL LOW (ref >60)
Glucose, Bld: 112 mg/dL — ABNORMAL HIGH (ref 70–99)
Potassium: 3.4 mmol/L — ABNORMAL LOW (ref 3.5–5.1)
Sodium: 134 mmol/L — ABNORMAL LOW (ref 135–145)
Total Bilirubin: 0.9 mg/dL (ref 0.3–1.2)
Total Protein: 8 g/dL (ref 6.5–8.1)

## 2019-07-21 LAB — CBC WITH DIFFERENTIAL/PLATELET
Abs Immature Granulocytes: 0.04 10*3/uL (ref 0.00–0.07)
Basophils Absolute: 0 10*3/uL (ref 0.0–0.1)
Basophils Relative: 0 %
Eosinophils Absolute: 0 10*3/uL (ref 0.0–0.5)
Eosinophils Relative: 0 %
HCT: 44.8 % (ref 36.0–46.0)
Hemoglobin: 14.9 g/dL (ref 12.0–15.0)
Immature Granulocytes: 0 %
Lymphocytes Relative: 18 %
Lymphs Abs: 1.9 10*3/uL (ref 0.7–4.0)
MCH: 28.1 pg (ref 26.0–34.0)
MCHC: 33.3 g/dL (ref 30.0–36.0)
MCV: 84.4 fL (ref 80.0–100.0)
Monocytes Absolute: 0.7 10*3/uL (ref 0.1–1.0)
Monocytes Relative: 7 %
Neutro Abs: 8.2 10*3/uL — ABNORMAL HIGH (ref 1.7–7.7)
Neutrophils Relative %: 75 %
Platelets: 270 10*3/uL (ref 150–400)
RBC: 5.31 MIL/uL — ABNORMAL HIGH (ref 3.87–5.11)
RDW: 13.6 % (ref 11.5–15.5)
WBC: 11 10*3/uL — ABNORMAL HIGH (ref 4.0–10.5)
nRBC: 0 % (ref 0.0–0.2)

## 2019-07-21 MED ORDER — IOHEXOL 350 MG/ML SOLN
100.0000 mL | Freq: Once | INTRAVENOUS | Status: AC | PRN
  Administered 2019-07-21: 100 mL via INTRAVENOUS

## 2019-07-21 MED ORDER — CYCLOBENZAPRINE HCL 10 MG PO TABS
10.0000 mg | ORAL_TABLET | Freq: Once | ORAL | Status: AC
  Administered 2019-07-21: 10 mg via ORAL
  Filled 2019-07-21: qty 1

## 2019-07-21 MED ORDER — OXYCODONE-ACETAMINOPHEN 5-325 MG PO TABS
1.0000 | ORAL_TABLET | Freq: Once | ORAL | Status: AC
  Administered 2019-07-21: 08:00:00 1 via ORAL
  Filled 2019-07-21: qty 1

## 2019-07-21 NOTE — ED Triage Notes (Signed)
Pt states doing yardwork Sunday, awoke Monday with pain right side of neck, middle of lower back and radiated down both legs.  Recent antibiotics for sinus infection, reports pain above eyes.  Denies vision changes.

## 2019-07-21 NOTE — ED Notes (Signed)
Report to [facility] for transport to [facility]

## 2019-07-21 NOTE — ED Notes (Signed)
ED Provider at bedside. 

## 2019-07-21 NOTE — ED Provider Notes (Signed)
[facility] Provider Note   CSN: [id number] Arrival date & time: 07/21/19  [id number]     History Chief Complaint  Patient presents with  . Back Pain    Ruth [name] is a 59 y.o. female.  HPI     CHIEF COMPLAINT: Chief Complaint  Patient presents with  . Back Pain    HISTORY OF PRESENT ILLNESS: Ruth [name], 59 y.o., female, presents for evaluation of neck pain and headache.   Sunday was doing yard work Symptoms started early Monday AM, rolled over, twinge in neck went to head Pain rated 10/10 in right of neck radiating to right side of head immediately Quality of pain pinching in the neck, back pain feels similar to prior sciatica Certain positions make it feel a little better/turning neck certain way however pain still present.  Taking  Motrin and goody powder every 4-6hr without relief. Not able to function all week due to pain.   No numbness/weakness/change in vision/CP/dyspnea/fevers   Last week put on abx for sinus infection  Hx of migraines but this feels different No trauma/falls  Hx of aneurysm  Past Medical History:  Diagnosis Date  . Allergic rhinitis, cause unspecified   . Asthma   . Eczema   . Food allergy   . Gallbladder problem   . Headache   . Hypertension     Patient Active Problem List   Diagnosis Date Noted  . RUQ pain 06/18/2019  . Dyspepsia 06/18/2019  . Moderate persistent asthma with acute exacerbation 03/05/2019  . Vitamin D deficiency 07/03/2018  . Depression 07/03/2018  . Class 1 obesity with serious comorbidity and body mass index (BMI) of 34.0 to 34.9 in adult 07/03/2018  . Essential hypertension 09/19/2017  . Aneurysm, cerebral, nonruptured 05/06/2015  . White matter abnormality on MRI of brain 05/06/2015  . Migraine headache without aura 05/06/2015  . Vertigo 05/06/2015  . Foreign body of ear, left 09/05/2012  . HTN (hypertension) 06/12/2012  . Allergic rhinitis due to pollen  12/27/2007  . 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 ECZEMA 08/29/2006    Past Surgical History:  Procedure Laterality Date  . CESAREAN SECTION    . CESAREAN SECTION Bilateral [id number]     OB History    Gravida  5   Para  4   Term  4   Preterm      AB      Living        SAB      TAB      Ectopic      Multiple      Live Births              Family History  Problem Relation Age of Onset  . Asthma Mother   . Allergies Mother   . Asthma Father   . Drug abuse Father   . Allergies Father     Social History   Tobacco Use  . Smoking status: Never Smoker  . Smokeless tobacco: Never Used  Substance Use Topics  . Alcohol use: Yes    Alcohol/week: 0.0 standard drinks    Comment: one glass of wine per week/fim  . Drug use: No    Home Medications Prior to Admission medications   Medication Sig Start Date End Date Taking? Authorizing Provider  albuterol (VENTOLIN HFA) 108 (90 Base) MCG/ACT inhaler Inhale 2 puffs into the lungs every 6 (six) hours as needed for wheezing or shortness of breath. 02/13/19  Carollee Herter, Yvonne R, DO  amLODipine (NORVASC) 5 MG tablet Take 1 tablet (5 mg total) by mouth daily. 03/27/19   Ann Held, DO  EPINEPHrine (EPI-PEN) 0.3 mg/0.3 mL SOAJ injection Inject into upper thigh for severe allergic reaction 05/18/13   Baird Lyons D, MD  fluticasone [facility]) 50 MCG/ACT nasal spray INSTILL 2 SPRAYS INTO EACH NOSTRIL EVERY DAY 09/13/18   Carollee Herter, Alferd Apa, DO  hydrochlorothiazide (HYDRODIURIL) 25 MG tablet Take 1 tablet (25 mg total) by mouth daily. 06/18/19   Carollee Herter, Yvonne R, DO  mometasone-formoterol (DULERA) 100-5 MCG/ACT AERO Inhale 2 puffs into the lungs 2 (two) times daily. 03/05/19   Roma Schanz R, DO  ondansetron (ZOFRAN ODT) 4 MG disintegrating tablet Take 1 tablet (4 mg total) by mouth every 8 (eight) hours as needed for nausea or vomiting. 07/19/19   Carollee Herter, Alferd Apa, DO  pantoprazole (PROTONIX)  40 MG tablet Take 1 tablet (40 mg total) by mouth daily. 06/18/19   Carollee Herter, Kendrick Fries R, DO  predniSONE (DELTASONE) 10 MG tablet TAKE 3 TABLETS PO QD FOR 3 DAYS THEN TAKE 2 TABLETS PO QD FOR 3 DAYS THEN TAKE 1 TABLET PO QD FOR 3 DAYS THEN TAKE 1/2 TAB PO QD FOR 3 DAYS 07/13/19   Ann Held, DO  topiramate (TOPAMAX) 50 MG tablet Take one or two at bedtime 07/10/19   Sater, Nanine Means, MD    Allergies    Other  Review of Systems   Review of Systems  Constitutional: Negative for fever.  HENT: Negative for sore throat.   Eyes: Negative for visual disturbance.  Respiratory: Negative for cough and shortness of breath.   Cardiovascular: Negative for chest pain.  Gastrointestinal: Negative for abdominal pain.  Genitourinary: Negative for difficulty urinating.  Musculoskeletal: Positive for back pain and neck pain.  Skin: Negative for rash.  Neurological: Negative for syncope and headaches.    Physical Exam Updated Vital Signs BP 137/86   Pulse 70   Temp 99 F (37.2 C) (Oral)   Resp 16   Ht 5\' 5"  (1.651 m)   Wt 103 kg   LMP 09/19/2015   SpO2 100%   BMI 37.79 kg/m   Physical Exam Vitals and nursing note reviewed.  Constitutional:      General: She is not in acute distress.    Appearance: She is well-developed. She is not diaphoretic.  HENT:     Head: Normocephalic and atraumatic.  Eyes:     General: No visual field deficit.    Conjunctiva/sclera: Conjunctivae normal.  Cardiovascular:     Rate and Rhythm: Normal rate and regular rhythm.     Pulses: Normal pulses.  Pulmonary:     Effort: Pulmonary effort is normal. No respiratory distress.  Abdominal:     General: There is no distension.     Palpations: Abdomen is soft.     Tenderness: There is no abdominal tenderness. There is no guarding.  Musculoskeletal:     Cervical back: Normal range of motion. Tenderness (right sided cervical spine tenderness) present.  Skin:    General: Skin is warm and dry.      Findings: No erythema or rash.  Neurological:     Mental Status: She is alert and oriented to person, place, and time.     GCS: GCS eye subscore is 4. GCS verbal subscore is 5. GCS motor subscore is 6.     Cranial Nerves: Cranial nerves are intact. No  cranial nerve deficit, dysarthria or facial asymmetry.     Sensory: No sensory deficit.     Motor: Motor function is intact. No weakness, tremor or pronator drift.     Coordination: Coordination is intact. Coordination normal.     ED Results / Procedures / Treatments   Labs (all labs ordered are listed, but only abnormal results are displayed) Labs Reviewed  CBC WITH DIFFERENTIAL/PLATELET - Abnormal; Notable for the following components:      Result Value   WBC 11.0 (*)    RBC 5.31 (*)    Neutro Abs 8.2 (*)    All other components within normal limits  COMPREHENSIVE METABOLIC PANEL - Abnormal; Notable for the following components:   Sodium 134 (*)    Potassium 3.4 (*)    Glucose, Bld 112 (*)    Creatinine, Ser 1.11 (*)    GFR calc non Af Amer 55 (*)    All other components within normal limits  RESPIRATORY PANEL BY RT PCR (FLU A&B, COVID)    EKG None  Radiology CT Angio Head W or Wo Contrast  Addendum Date: 07/21/2019   ADDENDUM REPORT: 07/21/2019 11:37 ADDENDUM: Critical Value/emergent results were called by telephone at the time of interpretation on 07/21/2019 at 1122 hours to Dr. Gareth Morgan , who verbally acknowledged these results. Electronically Signed   By: Genevie Ann M.D.   On: 07/21/2019 11:37   Result Date: 07/21/2019 CLINICAL DATA:  59 year old female with episode about 1 week ago of severe headache and neck pain after working in yard. History of small anterior communicating artery aneurysm. Abnormal plain head CT today. EXAM: CT ANGIOGRAPHY HEAD AND NECK TECHNIQUE: Multidetector CT imaging of the head and neck was performed using the standard protocol during bolus administration of intravenous contrast. Multiplanar CT  image reconstructions and MIPs were obtained to evaluate the vascular anatomy. Carotid stenosis measurements (when applicable) are obtained utilizing NASCET criteria, using the distal internal carotid diameter as the denominator. CONTRAST:  173mL OMNIPAQUE IOHEXOL 350 MG/ML SOLN COMPARISON:  Plain head CT 0836 hours today, CTA head 04/16/2015 and intracranial MRA 04/04/2015. FINDINGS: CT HEAD Brain: As described earlier today, ventricle size has enlarged since 2017, and there is now trace subarachnoid hemorrhage suspected layering in both occipital horns (series 6, image 14). No definite transependymal edema. Suspicious hyperdense sulci again noted (a especially left pericallosal sulci on sagittal image 35) compatible with trace subarachnoid hemorrhage. Stable gray-white matter differentiation throughout the brain. No cortically based acute infarct identified. No midline shift. Basilar cisterns remain patent. Calvarium and skull base: Stable, negative. Paranasal sinuses: Stable in clear. Orbits: No acute orbit or scalp soft tissue finding. CTA NECK Skeleton: No acute osseous abnormality identified. Upper chest: Negative. Other neck: Negative; the glottis is closed. Aortic arch: 3 vessel arch configuration with no arch atherosclerosis. Right carotid system: Proximal right CCA partially obscured by dense right subclavian vein contrast streak artifact. Visible right CCA, along with the right carotid bifurcation and cervical right ICA are negative. Left carotid system: Negative aside from tortuosity. Vertebral arteries: A portion of the proximal right subclavian artery is obscured by dense subclavian vein contrast but the right vertebral artery origin is patent and normal. Some of the right V1 segment is obscured, but otherwise the right vertebral artery is patent with occasional tortuosity to the skull base. No plaque or stenosis. Normal proximal left subclavian artery, left vertebral artery origin. Intermittently  tortuous but otherwise normal left vertebral to the skull base. CTA HEAD Posterior  circulation: Codominant distal vertebral arteries are patent without stenosis. Normal PICA origins and vertebrobasilar junction. Patent basilar artery without stenosis. Normal SCA and PCA origins. A left posterior communicating artery is present, the right is diminutive or absent as before. Bilateral PCA branches are stable and within normal limits. Anterior circulation: Both ICA siphons are patent without plaque or stenosis. The right siphon appears dominant as before. Patent carotid termini. Dominant left ACA A1, unchanged. Substantially enlarged and now irregular appearing anterior communicating artery aneurysm, up to 6 mm now, only 1-2 mm previously. See series 11, images 191 and 193, series 16, image 20, series 14, image 107, and series 15, image 49. The A2 segments remain patent. There is mild proximal A2 segment irregularity likely vasospasm. Distal ACA branches appears stable and within normal limits. Left MCA M1 segment and bifurcation are patent without stenosis. Right MCA M1 segment and bifurcation are patent without stenosis. Bilateral MCA branches are stable and within normal limits. Venous sinuses: Patent, the left transverse and sigmoid sinuses appear dominant as before. Anatomic variants: Dominant left ACA A1 and mildly dominant left ICA. Review of the MIP images confirms the above findings IMPRESSION: 1. 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 Trace subarachnoid hemorrhage including trace IVH visible now. Mild proximal ACA vasospasm. Acute ventriculomegaly, although no definite transependymal edema. Note also dominant Left ACA A1. 2. Elsewhere stable and negative CTA head and neck. No atherosclerosis or arterial stenosis identified. Electronically Signed: By: Genevie Ann M.D. On: 07/21/2019 11:12   CT Head Wo  Contrast  Result Date: 07/21/2019 CLINICAL DATA:  59 year old female with history of headache. Right-sided neck pain. EXAM: CT HEAD WITHOUT CONTRAST TECHNIQUE: Contiguous axial images were obtained from the base of the skull through the vertex without intravenous contrast. COMPARISON:  Head CT 04/16/2015. FINDINGS: Brain: Subtle areas of hyperattenuation are noted within the left Sylvian fissure (axial image 14 of series 2 and within sulci (sagittal image 40 of series 5), which could represent a trace amount of subarachnoid hemorrhage, or could be so-called "pseudo subarachnoid hemorrhage". When compared to the prior examination from 2017 there appears to be mild hydrocephalus, as evidenced by increased prominence of the temporal horns of the lateral ventricles bilaterally which were previously barely discernible. No discrete mass. No definite evidence to suggest acute cerebral infarction. Vascular: No hyperdense vessel or unexpected calcification. Skull: Normal. Negative for fracture or focal lesion. Sinuses/Orbits: No acute finding. Other: None. IMPRESSION: 1. There are findings concerning for potential subarachnoid hemorrhage, although this could simply reflect "pseudo subarachnoid hemorrhage" related to interval development of mild hydrocephalus compared to prior study from 2017. Given the patient's documented history of ACA aneurysm, further evaluation with repeat head CTA is recommended at this time. Critical Value/emergent results were called by telephone at the time of interpretation on 07/21/2019 at 9:17 am to provider [facility], who verbally acknowledged these results. Electronically Signed   By: Vinnie Langton M.D.   On: 07/21/2019 09:19   CT Angio Neck W and/or Wo Contrast  Addendum Date: 07/21/2019   ADDENDUM REPORT: 07/21/2019 11:37 ADDENDUM: Critical Value/emergent results were called by telephone at the time of interpretation on 07/21/2019 at 1122 hours to Dr. Gareth Morgan , who  verbally acknowledged these results. Electronically Signed   By: Genevie Ann M.D.   On: 07/21/2019 11:37   Result Date: 07/21/2019 CLINICAL DATA:  59 year old female with episode about 1 week ago of severe  headache and neck pain after working in yard. History of small anterior communicating artery aneurysm. Abnormal plain head CT today. EXAM: CT ANGIOGRAPHY HEAD AND NECK TECHNIQUE: Multidetector CT imaging of the head and neck was performed using the standard protocol during bolus administration of intravenous contrast. Multiplanar CT image reconstructions and MIPs were obtained to evaluate the vascular anatomy. Carotid stenosis measurements (when applicable) are obtained utilizing NASCET criteria, using the distal internal carotid diameter as the denominator. CONTRAST:  12mL OMNIPAQUE IOHEXOL 350 MG/ML SOLN COMPARISON:  Plain head CT 0836 hours today, CTA head 04/16/2015 and intracranial MRA 04/04/2015. FINDINGS: CT HEAD Brain: As described earlier today, ventricle size has enlarged since 2017, and there is now trace subarachnoid hemorrhage suspected layering in both occipital horns (series 6, image 14). No definite transependymal edema. Suspicious hyperdense sulci again noted (a especially left pericallosal sulci on sagittal image 35) compatible with trace subarachnoid hemorrhage. Stable gray-white matter differentiation throughout the brain. No cortically based acute infarct identified. No midline shift. Basilar cisterns remain patent. Calvarium and skull base: Stable, negative. Paranasal sinuses: Stable in clear. Orbits: No acute orbit or scalp soft tissue finding. CTA NECK Skeleton: No acute osseous abnormality identified. Upper chest: Negative. Other neck: Negative; the glottis is closed. Aortic arch: 3 vessel arch configuration with no arch atherosclerosis. Right carotid system: Proximal right CCA partially obscured by dense right subclavian vein contrast streak artifact. Visible right CCA, along with the  right carotid bifurcation and cervical right ICA are negative. Left carotid system: Negative aside from tortuosity. Vertebral arteries: A portion of the proximal right subclavian artery is obscured by dense subclavian vein contrast but the right vertebral artery origin is patent and normal. Some of the right V1 segment is obscured, but otherwise the right vertebral artery is patent with occasional tortuosity to the skull base. No plaque or stenosis. Normal proximal left subclavian artery, left vertebral artery origin. Intermittently tortuous but otherwise normal left vertebral to the skull base. CTA HEAD Posterior circulation: Codominant distal vertebral arteries are patent without stenosis. Normal PICA origins and vertebrobasilar junction. Patent basilar artery without stenosis. Normal SCA and PCA origins. A left posterior communicating artery is present, the right is diminutive or absent as before. Bilateral PCA branches are stable and within normal limits. Anterior circulation: Both ICA siphons are patent without plaque or stenosis. The right siphon appears dominant as before. Patent carotid termini. Dominant left ACA A1, unchanged. Substantially enlarged and now irregular appearing anterior communicating artery aneurysm, up to 6 mm now, only 1-2 mm previously. See series 11, images 191 and 193, series 16, image 20, series 14, image 107, and series 15, image 49. The A2 segments remain patent. There is mild proximal A2 segment irregularity likely vasospasm. Distal ACA branches appears stable and within normal limits. Left MCA M1 segment and bifurcation are patent without stenosis. Right MCA M1 segment and bifurcation are patent without stenosis. Bilateral MCA branches are stable and within normal limits. Venous sinuses: Patent, the left transverse and sigmoid sinuses appear dominant as before. Anatomic variants: Dominant left ACA A1 and mildly dominant left ICA. Review of the MIP images confirms the above findings  IMPRESSION: 1. 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 Trace subarachnoid hemorrhage including trace IVH visible now. Mild proximal ACA vasospasm. Acute ventriculomegaly, although no definite transependymal edema. Note also dominant Left ACA A1. 2. Elsewhere stable and negative CTA head and neck. No atherosclerosis or arterial stenosis  identified. Electronically Signed: By: Genevie Ann M.D. On: 07/21/2019 11:12    Procedures .Critical Care Performed by: Gareth Morgan, MD Authorized by: Gareth Morgan, MD   Critical care provider statement:    Critical care time (minutes):  60   Critical care was necessary to treat or prevent imminent or life-threatening deterioration of the following conditions:  CNS failure or compromise   Critical care was time spent personally by me on the following activities:  Discussions with consultants, evaluation of patient's response to treatment, examination of patient, ordering and performing treatments and interventions, ordering and review of laboratory studies, ordering and review of radiographic studies, pulse oximetry, re-evaluation of patient's condition, obtaining history from patient or surrogate and review of old charts   (including critical care time)  Medications Ordered in ED Medications  cyclobenzaprine (FLEXERIL) tablet 10 mg (10 mg Oral Given 07/21/19 0817)  oxyCODONE-acetaminophen (PERCOCET/ROXICET) 5-325 MG per tablet 1 tablet (1 tablet Oral Given 07/21/19 0817)  iohexol (OMNIPAQUE) 350 MG/ML injection 100 mL (100 mLs Intravenous Contrast Given 07/21/19 1023)    ED Course  I have reviewed the triage vital signs and the nursing notes.  Pertinent labs & imaging results that were available during my care of the patient were reviewed by me and considered in my medical decision making (see chart for details).    MDM Rules/Calculators/A&P                       59 year old female with a history of hypertension, migraine headaches, hypertension, nonruptured ACA aneurysm who presents with concern for neck pain and headache.  Also notes hx of LBP and sciatica and states has these symptoms today but not changed, no numbness/weakness/loss of bowel/bladder control.   Symptoms may be consistent with msk pain given pain with palpation, hx of doing yard work, however given hx of known aneurysm and sudden onset of symptoms will proceed with head CT.   Head CT shows findings potentially concerning for [facility] but may also be pseudo subarachnoid hemorrhage. Plan to perform CTA for further eval.   CTA shows findings consistent with ruptured ACA aneurysm, enlarged to 14mm from previous, trace SAH/IVH now visible.  Discussed with Bonne Dolores working with [facility] who reports because no beds available we should attempt transfer to another site.   Dr. Francesca Oman [facility] accepting patient for transfer.   BP remain less than 140 in the ED, will continue to monitor, no indication for nicardipine at this time. Remains alert, oriented, neurologically intact. Last po intake 8AM.     Final Clinical Impression(s) / ED Diagnoses Final diagnoses:  SAH (subarachnoid hemorrhage) ([place])  Ruptured cerebral aneurysm [facility])    Rx / DC Orders ED Discharge Orders    None       Gareth Morgan, MD 07/21/19 1256

## 2019-07-23 DIAGNOSIS — I671 Cerebral aneurysm, nonruptured: Secondary | ICD-10-CM | POA: Diagnosis not present

## 2019-07-23 DIAGNOSIS — I608 Other nontraumatic subarachnoid hemorrhage: Secondary | ICD-10-CM | POA: Diagnosis not present

## 2019-07-23 DIAGNOSIS — I10 Essential (primary) hypertension: Secondary | ICD-10-CM | POA: Diagnosis not present

## 2019-07-23 DIAGNOSIS — K219 Gastro-esophageal reflux disease without esophagitis: Secondary | ICD-10-CM | POA: Diagnosis not present

## 2019-07-23 DIAGNOSIS — I609 Nontraumatic subarachnoid hemorrhage, unspecified: Secondary | ICD-10-CM | POA: Diagnosis not present

## 2019-07-23 DIAGNOSIS — K8020 Calculus of gallbladder without cholecystitis without obstruction: Secondary | ICD-10-CM | POA: Diagnosis not present

## 2019-07-23 DIAGNOSIS — D72829 Elevated white blood cell count, unspecified: Secondary | ICD-10-CM | POA: Diagnosis not present

## 2019-07-23 DIAGNOSIS — I615 Nontraumatic intracerebral hemorrhage, intraventricular: Secondary | ICD-10-CM | POA: Diagnosis not present

## 2019-07-23 NOTE — Telephone Encounter (Signed)
We can start Emgality (she can pick up sample pak) monthly

## 2019-07-23 NOTE — Telephone Encounter (Signed)
Called pt. She was currently in the hospital. She had an aneurysm. She expressed dissatisfaction. She states no one called her back on Friday. Advised that our office is closed on Friday. She at first stated that no one called her back on Tuesday or Friday. I reminded her that we 07/17/19 and that is when Dr. Felecia [name] recommended she finish prednisone and amoxicillin and start the topamax. I offered for her to call once she is discharged so she can follow up with our office. She declined, and stated we downplayed her symptoms and she will not be coming back to our office.

## 2019-07-25 DIAGNOSIS — I671 Cerebral aneurysm, nonruptured: Secondary | ICD-10-CM | POA: Diagnosis not present

## 2019-07-25 DIAGNOSIS — K8020 Calculus of gallbladder without cholecystitis without obstruction: Secondary | ICD-10-CM | POA: Diagnosis not present

## 2019-07-25 DIAGNOSIS — I609 Nontraumatic subarachnoid hemorrhage, unspecified: Secondary | ICD-10-CM | POA: Diagnosis not present

## 2019-07-25 DIAGNOSIS — D72829 Elevated white blood cell count, unspecified: Secondary | ICD-10-CM | POA: Diagnosis not present

## 2019-07-25 DIAGNOSIS — I10 Essential (primary) hypertension: Secondary | ICD-10-CM | POA: Diagnosis not present

## 2019-07-25 DIAGNOSIS — I608 Other nontraumatic subarachnoid hemorrhage: Secondary | ICD-10-CM | POA: Diagnosis not present

## 2019-07-25 DIAGNOSIS — I615 Nontraumatic intracerebral hemorrhage, intraventricular: Secondary | ICD-10-CM | POA: Diagnosis not present

## 2019-07-25 DIAGNOSIS — K219 Gastro-esophageal reflux disease without esophagitis: Secondary | ICD-10-CM | POA: Diagnosis not present

## 2019-07-26 DIAGNOSIS — I609 Nontraumatic subarachnoid hemorrhage, unspecified: Secondary | ICD-10-CM | POA: Diagnosis not present

## 2019-07-26 DIAGNOSIS — I671 Cerebral aneurysm, nonruptured: Secondary | ICD-10-CM | POA: Diagnosis not present

## 2019-07-26 DIAGNOSIS — D72829 Elevated white blood cell count, unspecified: Secondary | ICD-10-CM | POA: Diagnosis not present

## 2019-07-26 DIAGNOSIS — K219 Gastro-esophageal reflux disease without esophagitis: Secondary | ICD-10-CM | POA: Diagnosis not present

## 2019-07-26 DIAGNOSIS — I10 Essential (primary) hypertension: Secondary | ICD-10-CM | POA: Diagnosis not present

## 2019-07-26 DIAGNOSIS — K8020 Calculus of gallbladder without cholecystitis without obstruction: Secondary | ICD-10-CM | POA: Diagnosis not present

## 2019-07-27 DIAGNOSIS — I10 Essential (primary) hypertension: Secondary | ICD-10-CM | POA: Diagnosis not present

## 2019-07-27 DIAGNOSIS — I615 Nontraumatic intracerebral hemorrhage, intraventricular: Secondary | ICD-10-CM | POA: Diagnosis not present

## 2019-07-27 DIAGNOSIS — I608 Other nontraumatic subarachnoid hemorrhage: Secondary | ICD-10-CM | POA: Diagnosis not present

## 2019-07-27 DIAGNOSIS — D72829 Elevated white blood cell count, unspecified: Secondary | ICD-10-CM | POA: Diagnosis not present

## 2019-07-27 DIAGNOSIS — K219 Gastro-esophageal reflux disease without esophagitis: Secondary | ICD-10-CM | POA: Diagnosis not present

## 2019-07-27 DIAGNOSIS — I671 Cerebral aneurysm, nonruptured: Secondary | ICD-10-CM | POA: Diagnosis not present

## 2019-07-27 DIAGNOSIS — K8020 Calculus of gallbladder without cholecystitis without obstruction: Secondary | ICD-10-CM | POA: Diagnosis not present

## 2019-07-27 DIAGNOSIS — I609 Nontraumatic subarachnoid hemorrhage, unspecified: Secondary | ICD-10-CM | POA: Diagnosis not present

## 2019-07-28 DIAGNOSIS — I609 Nontraumatic subarachnoid hemorrhage, unspecified: Secondary | ICD-10-CM | POA: Diagnosis not present

## 2019-07-29 MED ORDER — BACITRACIN 500 UNIT/GM EX OINT
TOPICAL_OINTMENT | CUTANEOUS | Status: DC
Start: 2019-07-30 — End: 2019-07-29

## 2019-07-29 MED ORDER — HYDRALAZINE HCL 20 MG/ML IJ SOLN
10.00 | INTRAMUSCULAR | Status: DC
Start: ? — End: 2019-07-29

## 2019-07-29 MED ORDER — NIMODIPINE 30 MG PO CAPS
60.00 | ORAL_CAPSULE | ORAL | Status: DC
Start: 2019-07-29 — End: 2019-07-29

## 2019-07-29 MED ORDER — POLYETHYLENE GLYCOL 3350 17 GM/SCOOP PO POWD
17.00 | ORAL | Status: DC
Start: 2019-07-30 — End: 2019-07-29

## 2019-07-29 MED ORDER — HEPARIN SODIUM (PORCINE) 5000 UNIT/ML IJ SOLN
5000.00 | INTRAMUSCULAR | Status: DC
Start: 2019-07-29 — End: 2019-07-29

## 2019-07-29 MED ORDER — LIDOCAINE 5 % EX PTCH
1.00 | MEDICATED_PATCH | CUTANEOUS | Status: DC
Start: 2019-07-29 — End: 2019-07-29

## 2019-07-29 MED ORDER — OXYCODONE HCL 5 MG PO TABS
5.00 | ORAL_TABLET | ORAL | Status: DC
Start: ? — End: 2019-07-29

## 2019-07-29 MED ORDER — LABETALOL HCL 5 MG/ML IV SOLN
10.00 | INTRAVENOUS | Status: DC
Start: ? — End: 2019-07-29

## 2019-07-29 MED ORDER — LEVETIRACETAM 500 MG PO TABS
500.00 | ORAL_TABLET | ORAL | Status: DC
Start: 2019-07-29 — End: 2019-07-29

## 2019-07-29 MED ORDER — SENNOSIDES-DOCUSATE SODIUM 8.6-50 MG PO TABS
2.00 | ORAL_TABLET | ORAL | Status: DC
Start: 2019-07-29 — End: 2019-07-29

## 2019-07-29 MED ORDER — LORATADINE 10 MG PO TABS
10.00 | ORAL_TABLET | ORAL | Status: DC
Start: 2019-07-29 — End: 2019-07-29

## 2019-07-29 MED ORDER — MOMETASONE FURO-FORMOTEROL FUM 100-5 MCG/ACT IN AERO
2.00 | INHALATION_SPRAY | RESPIRATORY_TRACT | Status: DC
Start: 2019-07-29 — End: 2019-07-29

## 2019-07-29 MED ORDER — ACETAMINOPHEN 325 MG PO TABS
650.00 | ORAL_TABLET | ORAL | Status: DC
Start: ? — End: 2019-07-29

## 2019-07-29 MED ORDER — PREGABALIN 150 MG PO CAPS
150.00 | ORAL_CAPSULE | ORAL | Status: DC
Start: 2019-07-29 — End: 2019-07-29

## 2019-07-29 MED ORDER — PANTOPRAZOLE SODIUM 40 MG PO TBEC
40.00 | DELAYED_RELEASE_TABLET | ORAL | Status: DC
Start: 2019-07-30 — End: 2019-07-29

## 2019-07-29 MED ORDER — ALBUTEROL SULFATE HFA 108 (90 BASE) MCG/ACT IN AERS
2.00 | INHALATION_SPRAY | RESPIRATORY_TRACT | Status: DC
Start: ? — End: 2019-07-29

## 2019-07-29 MED ORDER — AMLODIPINE BESYLATE 5 MG PO TABS
5.00 | ORAL_TABLET | ORAL | Status: DC
Start: 2019-07-30 — End: 2019-07-29

## 2019-10-11 ENCOUNTER — Other Ambulatory Visit: Payer: Self-pay | Admitting: *Deleted

## 2019-10-11 DIAGNOSIS — R1013 Epigastric pain: Secondary | ICD-10-CM

## 2019-10-11 MED ORDER — PANTOPRAZOLE SODIUM 40 MG PO TBEC: 40.0000 mg | DELAYED_RELEASE_TABLET | Freq: Every day | ORAL | 5 refills | Status: DC

## 2019-10-11 NOTE — Telephone Encounter (Signed)
Received request from [facility] for Pantoprazole. Refill sent.

## 2019-10-18 ENCOUNTER — Other Ambulatory Visit: Payer: Self-pay | Admitting: Family Medicine

## 2020-01-04 ENCOUNTER — Other Ambulatory Visit: Payer: Self-pay

## 2020-01-04 ENCOUNTER — Ambulatory Visit: Payer: Managed Care, Other (non HMO) | Admitting: Family Medicine

## 2020-01-04 ENCOUNTER — Encounter: Payer: Self-pay | Admitting: Family Medicine

## 2020-01-04 VITALS — BP 136/96 | HR 80 | Temp 98.5°F | Resp 12 | Ht 65.0 in | Wt 205.2 lb

## 2020-01-04 DIAGNOSIS — J302 Other seasonal allergic rhinitis: Secondary | ICD-10-CM

## 2020-01-04 DIAGNOSIS — I671 Cerebral aneurysm, nonruptured: Secondary | ICD-10-CM

## 2020-01-04 DIAGNOSIS — R1013 Epigastric pain: Secondary | ICD-10-CM | POA: Diagnosis not present

## 2020-01-04 DIAGNOSIS — J4520 Mild intermittent asthma, uncomplicated: Secondary | ICD-10-CM

## 2020-01-04 DIAGNOSIS — I10 Essential (primary) hypertension: Secondary | ICD-10-CM | POA: Diagnosis not present

## 2020-01-04 DIAGNOSIS — G43009 Migraine without aura, not intractable, without status migrainosus: Secondary | ICD-10-CM

## 2020-01-04 DIAGNOSIS — J4541 Moderate persistent asthma with (acute) exacerbation: Secondary | ICD-10-CM

## 2020-01-04 DIAGNOSIS — G43909 Migraine, unspecified, not intractable, without status migrainosus: Secondary | ICD-10-CM

## 2020-01-04 LAB — COMPREHENSIVE METABOLIC PANEL
AG Ratio: 1.3 (calc) (ref 1.0–2.5)
ALT: 40 U/L — ABNORMAL HIGH (ref 6–29)
AST: 33 U/L (ref 10–35)
Albumin: 4.4 g/dL (ref 3.6–5.1)
Alkaline phosphatase (APISO): 166 U/L — ABNORMAL HIGH (ref 37–153)
BUN: 16 mg/dL (ref 7–25)
CO2: 26 mmol/L (ref 20–32)
Calcium: 9.8 mg/dL (ref 8.6–10.4)
Chloride: 108 mmol/L (ref 98–110)
Creat: 1.01 mg/dL (ref 0.50–1.05)
Globulin: 3.5 g/dL (calc) (ref 1.9–3.7)
Glucose, Bld: 100 mg/dL — ABNORMAL HIGH (ref 65–99)
Potassium: 3.8 mmol/L (ref 3.5–5.3)
Sodium: 144 mmol/L (ref 135–146)
Total Bilirubin: 0.4 mg/dL (ref 0.2–1.2)
Total Protein: 7.9 g/dL (ref 6.1–8.1)

## 2020-01-04 LAB — CBC WITH DIFFERENTIAL/PLATELET
Absolute Monocytes: 282 cells/uL (ref 200–950)
Basophils Absolute: 42 cells/uL (ref 0–200)
Basophils Relative: 0.9 %
Eosinophils Absolute: 103 cells/uL (ref 15–500)
Eosinophils Relative: 2.2 %
HCT: 43 % (ref 35.0–45.0)
Hemoglobin: 14.3 g/dL (ref 11.7–15.5)
Lymphs Abs: 1316 cells/uL (ref 850–3900)
MCH: 28.1 pg (ref 27.0–33.0)
MCHC: 33.3 g/dL (ref 32.0–36.0)
MCV: 84.5 fL (ref 80.0–100.0)
MPV: 10.7 fL (ref 7.5–12.5)
Monocytes Relative: 6 %
Neutro Abs: 2956 cells/uL (ref 1500–7800)
Neutrophils Relative %: 62.9 %
Platelets: 253 10*3/uL (ref 140–400)
RBC: 5.09 10*6/uL (ref 3.80–5.10)
RDW: 13.5 % (ref 11.0–15.0)
Total Lymphocyte: 28 %
WBC: 4.7 10*3/uL (ref 3.8–10.8)

## 2020-01-04 LAB — LIPID PANEL
Cholesterol: 197 mg/dL (ref <200)
HDL: 57 mg/dL (ref >=50)
LDL Cholesterol (Calc): 123 mg/dL (calc) — ABNORMAL HIGH
Non-HDL Cholesterol (Calc): 140 mg/dL (calc) — ABNORMAL HIGH (ref <130)
Total CHOL/HDL Ratio: 3.5 (calc) (ref <5.0)
Triglycerides: 77 mg/dL (ref <150)

## 2020-01-04 MED ORDER — FLUTICASONE PROPIONATE 50 MCG/ACT NA SUSP: NASAL | 2 refills | Status: DC

## 2020-01-04 MED ORDER — EPINEPHRINE 0.3 MG/0.3ML IJ SOAJ: INTRAMUSCULAR | 1 refills | Status: AC

## 2020-01-04 MED ORDER — ALBUTEROL SULFATE HFA 108 (90 BASE) MCG/ACT IN AERS: 2.0000 | INHALATION_SPRAY | Freq: Four times a day (QID) | RESPIRATORY_TRACT | 1 refills | Status: DC | PRN

## 2020-01-04 MED ORDER — DULERA 100-5 MCG/ACT IN AERO: 2.0000 | INHALATION_SPRAY | Freq: Two times a day (BID) | RESPIRATORY_TRACT | 5 refills | Status: DC

## 2020-01-04 MED ORDER — PANTOPRAZOLE SODIUM 40 MG PO TBEC: 40.0000 mg | DELAYED_RELEASE_TABLET | Freq: Every day | ORAL | 3 refills | Status: DC

## 2020-01-04 NOTE — Assessment & Plan Note (Signed)
Refer to neuro 

## 2020-01-04 NOTE — Progress Notes (Signed)
Patient ID: Ruth [name], female    DOB: 03-17-1961  Age: 59 y.o. MRN: [id number]    Subjective:  Subjective  HPI Ruth [name] presents for f/u.   She had and aneurysm that neuro was watching it--- she developed a headache and went to er----- there was a ? Of SAH---- but pt had surgery to clip an aneurysm   Review of Systems  Constitutional: Negative for appetite change, diaphoresis, fatigue and unexpected weight change.  Eyes: Negative for pain, redness and visual disturbance.  Respiratory: Negative for cough, chest tightness, shortness of breath and wheezing.   Cardiovascular: Negative for chest pain, palpitations and leg swelling.  Endocrine: Negative for cold intolerance, heat intolerance, polydipsia, polyphagia and polyuria.  Genitourinary: Negative for difficulty urinating, dysuria and frequency.  Neurological: Positive for headaches. Negative for dizziness, light-headedness and numbness.    History Past Medical History:  Diagnosis Date  . Allergic rhinitis, cause unspecified   . Asthma   . Eczema   . Food allergy   . Gallbladder problem   . Headache   . Hypertension     She has a past surgical history that includes Cesarean section and Cesarean section (Bilateral, [id number]).   Her family history includes Allergies in her father and mother; Asthma in her father and mother; Drug abuse in her father.She reports that she has never smoked. She has never used smokeless tobacco. She reports current alcohol use. She reports that she does not use drugs.  Current Outpatient Medications on File Prior to Visit  Medication Sig Dispense Refill  . amLODipine (NORVASC) 5 MG tablet TAKE 1 TABLET(5 MG) BY MOUTH DAILY 90 tablet 1  . topiramate (TOPAMAX) 50 MG tablet Take one or two at bedtime 60 tablet 11   No current facility-administered medications on file prior to visit.     Objective:  Objective  Physical Exam Vitals and nursing note reviewed.  Constitutional:       Appearance: She is well-developed.  HENT:     Head: Normocephalic and atraumatic.  Eyes:     Conjunctiva/sclera: Conjunctivae normal.  Neck:     Thyroid: No thyromegaly.     Vascular: No carotid bruit or JVD.  Cardiovascular:     Rate and Rhythm: Normal rate and regular rhythm.     Heart sounds: Normal heart sounds. No murmur heard.   Pulmonary:     Effort: Pulmonary effort is normal. No respiratory distress.     Breath sounds: Normal breath sounds. No wheezing or rales.  Chest:     Chest wall: No tenderness.  Musculoskeletal:     Cervical back: Normal range of motion and neck supple.  Neurological:     Mental Status: She is alert and oriented to person, place, and time.    BP (!) 136/96 (BP Location: Right Arm, Cuff Size: Large)   Pulse 80   Temp 98.5 F (36.9 C) (Oral)   Resp 12   Ht 5\' 5"  (1.651 m)   Wt 205 lb 3.2 oz (93.1 kg)   LMP 09/19/2015   SpO2 99%   BMI 34.15 kg/m  Wt Readings from Last 3 Encounters:  01/04/20 205 lb 3.2 oz (93.1 kg)  07/21/19 227 lb 1.2 oz (103 kg)  07/13/19 227 lb (103 kg)     Lab Results  Component Value Date   WBC 11.0 (H) 07/21/2019   HGB 14.9 07/21/2019   HCT 44.8 07/21/2019   PLT 270 07/21/2019   GLUCOSE 112 (H) 07/21/2019  CHOL 171 03/05/2019   TRIG 66.0 03/05/2019   HDL 63.60 03/05/2019   LDLCALC 94 03/05/2019   ALT 27 07/21/2019   AST 20 07/21/2019   NA 134 (L) 07/21/2019   K 3.4 (L) 07/21/2019   CL 100 07/21/2019   CREATININE 1.11 (H) 07/21/2019   BUN 18 07/21/2019   CO2 23 07/21/2019   TSH 1.720 04/13/2018   HGBA1C 5.6 03/05/2019   MICROALBUR 1.1 07/03/2012    CT Angio Head W or Wo Contrast  Addendum Date: 07/21/2019   ADDENDUM REPORT: 07/21/2019 11:37 ADDENDUM: Critical Value/emergent results were called by telephone at the time of interpretation on 07/21/2019 at 1122 hours to Dr. Gareth Morgan , who verbally acknowledged these results. Electronically Signed   By: Genevie Ann M.D.   On: 07/21/2019 11:37    Result Date: 07/21/2019 CLINICAL DATA:  59 year old female with episode about 1 week ago of severe headache and neck pain after working in yard. History of small anterior communicating artery aneurysm. Abnormal plain head CT today. EXAM: CT ANGIOGRAPHY HEAD AND NECK TECHNIQUE: Multidetector CT imaging of the head and neck was performed using the standard protocol during bolus administration of intravenous contrast. Multiplanar CT image reconstructions and MIPs were obtained to evaluate the vascular anatomy. Carotid stenosis measurements (when applicable) are obtained utilizing NASCET criteria, using the distal internal carotid diameter as the denominator. CONTRAST:  180mL OMNIPAQUE IOHEXOL 350 MG/ML SOLN COMPARISON:  Plain head CT 0836 hours today, CTA head 04/16/2015 and intracranial MRA 04/04/2015. FINDINGS: CT HEAD Brain: As described earlier today, ventricle size has enlarged since 2017, and there is now trace subarachnoid hemorrhage suspected layering in both occipital horns (series 6, image 14). No definite transependymal edema. Suspicious hyperdense sulci again noted (a especially left pericallosal sulci on sagittal image 35) compatible with trace subarachnoid hemorrhage. Stable gray-white matter differentiation throughout the brain. No cortically based acute infarct identified. No midline shift. Basilar cisterns remain patent. Calvarium and skull base: Stable, negative. Paranasal sinuses: Stable in clear. Orbits: No acute orbit or scalp soft tissue finding. CTA NECK Skeleton: No acute osseous abnormality identified. Upper chest: Negative. Other neck: Negative; the glottis is closed. Aortic arch: 3 vessel arch configuration with no arch atherosclerosis. Right carotid system: Proximal right CCA partially obscured by dense right subclavian vein contrast streak artifact. Visible right CCA, along with the right carotid bifurcation and cervical right ICA are negative. Left carotid system: Negative aside from  tortuosity. Vertebral arteries: A portion of the proximal right subclavian artery is obscured by dense subclavian vein contrast but the right vertebral artery origin is patent and normal. Some of the right V1 segment is obscured, but otherwise the right vertebral artery is patent with occasional tortuosity to the skull base. No plaque or stenosis. Normal proximal left subclavian artery, left vertebral artery origin. Intermittently tortuous but otherwise normal left vertebral to the skull base. CTA HEAD Posterior circulation: Codominant distal vertebral arteries are patent without stenosis. Normal PICA origins and vertebrobasilar junction. Patent basilar artery without stenosis. Normal SCA and PCA origins. A left posterior communicating artery is present, the right is diminutive or absent as before. Bilateral PCA branches are stable and within normal limits. Anterior circulation: Both ICA siphons are patent without plaque or stenosis. The right siphon appears dominant as before. Patent carotid termini. Dominant left ACA A1, unchanged. Substantially enlarged and now irregular appearing anterior communicating artery aneurysm, up to 6 mm now, only 1-2 mm previously. See series 11, images 191 and 193, series 16,  image 20, series 14, image 107, and series 15, image 49. The A2 segments remain patent. There is mild proximal A2 segment irregularity likely vasospasm. Distal ACA branches appears stable and within normal limits. Left MCA M1 segment and bifurcation are patent without stenosis. Right MCA M1 segment and bifurcation are patent without stenosis. Bilateral MCA branches are stable and within normal limits. Venous sinuses: Patent, the left transverse and sigmoid sinuses appear dominant as before. Anatomic variants: Dominant left ACA A1 and mildly dominant left ICA. Review of the MIP images confirms the above findings IMPRESSION: 1. 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 Trace subarachnoid hemorrhage including trace IVH visible now. Mild proximal ACA vasospasm. Acute ventriculomegaly, although no definite transependymal edema. Note also dominant Left ACA A1. 2. Elsewhere stable and negative CTA head and neck. No atherosclerosis or arterial stenosis identified. Electronically Signed: By: Genevie Ann M.D. On: 07/21/2019 11:12   CT Head Wo Contrast  Result Date: 07/21/2019 CLINICAL DATA:  59 year old female with history of headache. Right-sided neck pain. EXAM: CT HEAD WITHOUT CONTRAST TECHNIQUE: Contiguous axial images were obtained from the base of the skull through the vertex without intravenous contrast. COMPARISON:  Head CT 04/16/2015. FINDINGS: Brain: Subtle areas of hyperattenuation are noted within the left Sylvian fissure (axial image 14 of series 2 and within sulci (sagittal image 40 of series 5), which could represent a trace amount of subarachnoid hemorrhage, or could be so-called "pseudo subarachnoid hemorrhage". When compared to the prior examination from 2017 there appears to be mild hydrocephalus, as evidenced by increased prominence of the temporal horns of the lateral ventricles bilaterally which were previously barely discernible. No discrete mass. No definite evidence to suggest acute cerebral infarction. Vascular: No hyperdense vessel or unexpected calcification. Skull: Normal. Negative for fracture or focal lesion. Sinuses/Orbits: No acute finding. Other: None. IMPRESSION: 1. There are findings concerning for potential subarachnoid hemorrhage, although this could simply reflect "pseudo subarachnoid hemorrhage" related to interval development of mild hydrocephalus compared to prior study from 2017. Given the patient's documented history of ACA aneurysm, further evaluation with repeat head CTA is recommended at this time. Critical Value/emergent results were called by telephone at the time of interpretation on  07/21/2019 at 9:17 am to provider [facility], who verbally acknowledged these results. Electronically Signed   By: Vinnie Langton M.D.   On: 07/21/2019 09:19   CT Angio Neck W and/or Wo Contrast  Addendum Date: 07/21/2019   ADDENDUM REPORT: 07/21/2019 11:37 ADDENDUM: Critical Value/emergent results were called by telephone at the time of interpretation on 07/21/2019 at 1122 hours to Dr. Gareth Morgan , who verbally acknowledged these results. Electronically Signed   By: Genevie Ann M.D.   On: 07/21/2019 11:37   Result Date: 07/21/2019 CLINICAL DATA:  58 year old female with episode about 1 week ago of severe headache and neck pain after working in yard. History of small anterior communicating artery aneurysm. Abnormal plain head CT today. EXAM: CT ANGIOGRAPHY HEAD AND NECK TECHNIQUE: Multidetector CT imaging of the head and neck was performed using the standard protocol during bolus administration of intravenous contrast. Multiplanar CT image reconstructions and MIPs were obtained to evaluate the vascular anatomy. Carotid stenosis measurements (when applicable) are obtained utilizing NASCET criteria, using the distal internal carotid diameter as the denominator. CONTRAST:  179mL OMNIPAQUE IOHEXOL 350 MG/ML SOLN COMPARISON:  Plain head CT 0836 hours today, CTA head 04/16/2015 and intracranial MRA 04/04/2015. FINDINGS:  CT HEAD Brain: As described earlier today, ventricle size has enlarged since 2017, and there is now trace subarachnoid hemorrhage suspected layering in both occipital horns (series 6, image 14). No definite transependymal edema. Suspicious hyperdense sulci again noted (a especially left pericallosal sulci on sagittal image 35) compatible with trace subarachnoid hemorrhage. Stable gray-white matter differentiation throughout the brain. No cortically based acute infarct identified. No midline shift. Basilar cisterns remain patent. Calvarium and skull base: Stable, negative. Paranasal sinuses:  Stable in clear. Orbits: No acute orbit or scalp soft tissue finding. CTA NECK Skeleton: No acute osseous abnormality identified. Upper chest: Negative. Other neck: Negative; the glottis is closed. Aortic arch: 3 vessel arch configuration with no arch atherosclerosis. Right carotid system: Proximal right CCA partially obscured by dense right subclavian vein contrast streak artifact. Visible right CCA, along with the right carotid bifurcation and cervical right ICA are negative. Left carotid system: Negative aside from tortuosity. Vertebral arteries: A portion of the proximal right subclavian artery is obscured by dense subclavian vein contrast but the right vertebral artery origin is patent and normal. Some of the right V1 segment is obscured, but otherwise the right vertebral artery is patent with occasional tortuosity to the skull base. No plaque or stenosis. Normal proximal left subclavian artery, left vertebral artery origin. Intermittently tortuous but otherwise normal left vertebral to the skull base. CTA HEAD Posterior circulation: Codominant distal vertebral arteries are patent without stenosis. Normal PICA origins and vertebrobasilar junction. Patent basilar artery without stenosis. Normal SCA and PCA origins. A left posterior communicating artery is present, the right is diminutive or absent as before. Bilateral PCA branches are stable and within normal limits. Anterior circulation: Both ICA siphons are patent without plaque or stenosis. The right siphon appears dominant as before. Patent carotid termini. Dominant left ACA A1, unchanged. Substantially enlarged and now irregular appearing anterior communicating artery aneurysm, up to 6 mm now, only 1-2 mm previously. See series 11, images 191 and 193, series 16, image 20, series 14, image 107, and series 15, image 49. The A2 segments remain patent. There is mild proximal A2 segment irregularity likely vasospasm. Distal ACA branches appears stable and within  normal limits. Left MCA M1 segment and bifurcation are patent without stenosis. Right MCA M1 segment and bifurcation are patent without stenosis. Bilateral MCA branches are stable and within normal limits. Venous sinuses: Patent, the left transverse and sigmoid sinuses appear dominant as before. Anatomic variants: Dominant left ACA A1 and mildly dominant left ICA. Review of the MIP images confirms the above findings IMPRESSION: 1. 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 Trace subarachnoid hemorrhage including trace IVH visible now. Mild proximal ACA vasospasm. Acute ventriculomegaly, although no definite transependymal edema. Note also dominant Left ACA A1. 2. Elsewhere stable and negative CTA head and neck. No atherosclerosis or arterial stenosis identified. Electronically Signed: By: Genevie Ann M.D. On: 07/21/2019 11:12     Assessment & Plan:  Plan  I have discontinued Levie M. Mones's hydrochlorothiazide, predniSONE, and ondansetron. I have also changed her EPINEPHrine. Additionally, I am having her maintain her topiramate, amLODipine, pantoprazole, albuterol, Dulera, and fluticasone.  Meds ordered this encounter  Medications  . pantoprazole (PROTONIX) 40 MG tablet    Sig: Take 1 tablet (40 mg total) by mouth daily.    Dispense:  90 tablet    Refill:  3  . albuterol (VENTOLIN HFA) 108 (90 Base) MCG/ACT inhaler  Sig: Inhale 2 puffs into the lungs every 6 (six) hours as needed for wheezing or shortness of breath.    Dispense:  18 g    Refill:  1  . mometasone-formoterol (DULERA) 100-5 MCG/ACT AERO    Sig: Inhale 2 puffs into the lungs 2 (two) times daily.    Dispense:  13 g    Refill:  5  . fluticasone (FLONASE) 50 MCG/ACT nasal spray    Sig: INSTILL 2 SPRAYS INTO EACH NOSTRIL EVERY DAY    Dispense:  16 g    Refill:  2  . EPINEPHrine 0.3 mg/0.3 mL IJ SOAJ injection    Sig: Inject into  upper thigh for severe allergic reaction    Dispense:  2 each    Refill:  1    Problem List Items Addressed This Visit      Unprioritized   Aneurysm, cerebral, nonruptured    S/p clipping Refer to neuro       Relevant Medications   EPINEPHrine 0.3 mg/0.3 mL IJ SOAJ injection   Dyspepsia   Relevant Medications   pantoprazole (PROTONIX) 40 MG tablet   Essential hypertension    Well controlled, no changes to meds. Encouraged heart healthy diet such as the DASH diet and exercise as tolerated.       Relevant Medications   EPINEPHrine 0.3 mg/0.3 mL IJ SOAJ injection   HTN (hypertension)   Relevant Medications   EPINEPHrine 0.3 mg/0.3 mL IJ SOAJ injection   Other Relevant Orders   Lipid panel   Comprehensive metabolic panel   CBC with Differential/Platelet   Migraine headache without aura    Refer to neuro       Relevant Medications   EPINEPHrine 0.3 mg/0.3 mL IJ SOAJ injection   Moderate persistent asthma with acute exacerbation    Stable Refill inhalers       Relevant Medications   albuterol (VENTOLIN HFA) 108 (90 Base) MCG/ACT inhaler   mometasone-formoterol (DULERA) 100-5 MCG/ACT AERO    Other Visit Diagnoses    Migraine without status migrainosus, not intractable, unspecified migraine type    -  Primary   Relevant Medications   EPINEPHrine 0.3 mg/0.3 mL IJ SOAJ injection   Other Relevant Orders   Ambulatory referral to Neurology   Aneurysm of anterior communicating artery       Relevant Medications   EPINEPHrine 0.3 mg/0.3 mL IJ SOAJ injection   Other Relevant Orders   Ambulatory referral to Neurology   Lipid panel   Comprehensive metabolic panel   CBC with Differential/Platelet   Mild intermittent asthma without complication       Relevant Medications   albuterol (VENTOLIN HFA) 108 (90 Base) MCG/ACT inhaler   mometasone-formoterol (DULERA) 100-5 MCG/ACT AERO   Seasonal allergies       Relevant Medications   fluticasone (FLONASE) 50 MCG/ACT nasal spray        Follow-up: Return in about 6 months (around 07/04/2020), or if symptoms worsen or fail to improve, for annual exam, fasting.  Ann Held, DO

## 2020-01-04 NOTE — Assessment & Plan Note (Signed)
Stable Refill inhalers  

## 2020-01-04 NOTE — Patient Instructions (Signed)
Cerebral Aneurysm  A cerebral aneurysm is a bulge that occurs in the blood vessel inside the brain. An aneurysm is caused when a weakened part of the blood vessel expands. The blood vessel expands due to the constant pressure from the flow of blood through the weakened blood vessel. As the weakened aneurysm expands, the walls of the aneurysm become weaker. Aneurysms are dangerous because they can leak or burst (rupture). When a cerebral aneurysm ruptures, it causes bleeding in the brain (subarachnoid hemorrhage). The blood flow to the area of the brain supplied by the artery is also reduced. This can cause stroke, seizures, or coma. A ruptured cerebral aneurysm is a medical emergency. This can cause permanent brain damage or death. What are the causes? The exact cause of this condition is not known. What increases the risk? This condition is more likely to develop in people who:  Are older. The condition is most common in people between the ages of 56-60.  Are female  Have a family history of aneurysm in two or more direct relatives.  Have certain conditions that are passed along from parent to child (inherited). They include: ? Autosomal dominant polycystic kidney disease. This is a condition in which small, fluid-filled sacs (cysts) develop in the kidney. ? Neurofibromatosis type 1. In this condition, flat spots develop under the skin (pigmentation) and tumors grow along nerves in the skin, brain, and other parts of the body. ? Ehlers-Danlos syndrome. This is a condition in which bad connective tissue causes loose or unstable joints and creates a very soft skin that bruises or tears easily.  Smoke.  Have high blood pressure (hypertension).  Abuse alcohol. What are the signs or symptoms? The signs and symptoms of a cerebral aneurysm that has not leaked or ruptured can depend on its size and rate of growth. A small, unchanging aneurysm generally does not cause symptoms. A larger aneurysm  that is steadily growing can increase pressure on the brain or nerves.  The increased pressure from a cerebral aneurysm that has not leaked or ruptured can cause:  A headache.  Vision problems.  Numbness or weakness in an arm or leg.  Memory problems.  Problems speaking.  Seizures. If an aneurysm leaks or ruptures, it can cause a life-threatening condition, such as stroke. Symptoms may include:  A sudden, severe headache with no known cause. The headache is often described as the worst headache ever experienced.  Stiff neck.  Nausea or vomiting, especially when combined with other symptoms, such as a headache.  Sudden weakness or numbness of the face, arm, or leg, especially on one side of the body.  Sudden trouble walking or difficulty moving the arms or legs.  Double vision.  Sudden trouble seeing in one or both eyes.  Trouble speaking or understanding speech (aphasia).  Trouble swallowing.  Dizziness.  Loss of balance or coordination.  Intolerance to light.  Sudden confusion or loss of consciousness. How is this diagnosed? This condition is diagnosed using certain tests, including:  CT scan.  Computed tomographic angiogram (CTA). This test uses a dye and a scanner to produce images of your blood vessels.  Magnetic resonance angiogram (MRA). This test uses an MRI machine to produce images of your blood vessels.  Digital subtraction angiogram (DSA). This test involves placing a flexible, thin tube (catheter) into the artery in your thigh and guiding it up to the arteries in the brain. A dye is then injected into the area and X-rays are taken to create  images of your blood vessels. How is this treated? Unruptured aneurysm Treatment is complex when an aneurysm is found and it is not causing problems. Treatment is individualized, as each case is different. Many factors must be considered, such as the size and exact location of your aneurysm, your age, your overall  health, and your preferences. Small aneurysms in certain locations of the brain have a very low chance of bleeding or rupturing. These small aneurysms may not be treated.  In some cases, however, treatment may be required. Treatment depends on the size and location of the aneurysm. They may include:  Coiling. During this procedure, a catheter is inserted and advanced through a blood vessel. Once the catheter reaches the aneurysm, tiny coils are used to block blood flow into the aneurysm. This procedure is sometimes done at the same time as a DSA.  Surgical clipping. During surgery, a clip is placed at the base of the aneurysm. The clip prevents blood from continuing to enter the aneurysm.  Flow diversion. This procedure is used to divert blood flow around the aneurysm with a stent that is placed across the opening of an aneurysm. Ruptured aneurysm Immediate emergency surgery or coiling may be needed to help prevent damage to the brain and to reduce the risk of rebleeding. The timing of treatment is an important factor in preventing complications. Successful early treatment of a ruptured aneurysm within the first 3 days of a bleed helps to prevent rebleeding and blood vessel spasm. In some cases, there may be a reason to treat 10-14 days after a rupture. Many factors are considered when making this decision, and each case is handled individually. Follow these instructions at home: If your aneurysm is not treated:  Take over-the-counter and prescription medicines only as told by your health care provider.  Follow a diet suggested by your health care provider. Certain dietary changes may be advised to address high blood pressure (hypertension), such as choosing foods that are low in salt (sodium), saturated fat, trans fat, and cholesterol.  Stay physically active. It is recommended that you get at least 30 minutes of activity on most or all days.  Do not use any products that contain nicotine or  tobacco, such as cigarettes and e-cigarettes. If you need help quitting, ask your health care provider.  Limit alcohol intake to no more than 1 drink a day for nonpregnant women and 2 drinks a day for men. One drink equals 12 oz of beer, 5 oz of wine, or 1 oz of hard liquor.  Do not use street drugs. If you need help quitting, ask your health care provider.  Keep all follow-up visits as told by your health care provider. This is important. This includes any referrals, imaging studies, and laboratory tests. Proper follow-up may prevent an aneurysm rupture or a stroke. Get help right away if:  You have a sudden, severe headache with no known cause. This may include a stiff neck.  You have sudden nausea or vomiting with a severe headache.  You have a seizure.  You have other symptoms of stroke. The acronym BEFAST is an easy way to remember the main warning signs of stroke. ? B = Balance problems. Signs include dizziness, sudden trouble walking, or loss of balance. ? E = Eye problems. This includes trouble seeing or a sudden change in vision. ? F = Face changes. This includes sudden weakness or numbness of the face, or the face or eyelid drooping to one side. ? A =  Arm weakness or numbness. This happens suddenly and usually on one side of the body. ? S = Speech problems. This includes trouble speaking or trouble understanding. ? T = Time. Time to call 911 or seek emergency care. Do not wait to see if symptoms will go away. Make note of the time your symptoms started. These symptoms may represent a serious problem that is an emergency. Do not wait to see if the symptoms will go away. Get medical help right away. Call your local emergency services (911 in the [place] Do not drive yourself to the hospital. Summary  An aneurysm is a bulge in an artery.  Aneurysms are dangerous because they can leak or burst (rupture). When a cerebral aneurysm ruptures, it causes bleeding in the brain.  Treatment  depends on whether the aneurysm is ruptured. A ruptured aneurysm is a medical emergency.  Get help right away if you have symptoms of stroke. The acronym BEFAST is an easy way to remember the main warning signs of stroke. This information is not intended to replace advice given to you by your health care provider. Make sure you discuss any questions you have with your health care provider. Document Revised: 12/02/2017 Document Reviewed: 04/22/2016 Elsevier Patient Education  [place].

## 2020-01-04 NOTE — Assessment & Plan Note (Signed)
S/p clipping Refer to neuro

## 2020-01-04 NOTE — Assessment & Plan Note (Signed)
Well controlled, no changes to meds. Encouraged heart healthy diet such as the DASH diet and exercise as tolerated.  °

## 2020-01-06 ENCOUNTER — Other Ambulatory Visit: Payer: Self-pay | Admitting: Family Medicine

## 2020-01-06 DIAGNOSIS — E785 Hyperlipidemia, unspecified: Secondary | ICD-10-CM

## 2020-01-08 NOTE — Progress Notes (Deleted)
PATIENT: Ruth [name] DOB: 01/11/1961  REASON FOR VISIT: follow up HISTORY FROM: patient  No chief complaint on file.    HISTORY OF PRESENT ILLNESS: Today 01/08/20 [name] is a 59 y.o. female here today for follow up for migraines and history of aneurysm. She was last seen in 06/2019 when she was advised to update MRI and start topiramate. She was also provided amoxicillin and prednisone taper for concerns of pansinusitis. She reported some improvement for 2 days but then neck pain and headache returned. She was unable to update MRI as she was not feeling well. 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 She was transferred to [facility] as there were no beds available at [facility]. She has been followed by [facility] neurosurgery since aneurysm clipping.   HISTORY: (copied from Dr Garth Bigness note on 07/10/2019)  [name] is a 59 yo woman with headaches, AComm aneurysm and white matter changes on MRI.  Update 07/10/2019: She is having about 10-12 migraines a month.  They are located on the right side or both sides.  She gets a pressure sensation in her head.   She has nausea (usually mild) an photophobia and phonophobia.   She takes Motrin or Goody's usually with benefit.    Her BP has done well lately (125/80 today and 140/80 recently).   She is on amlodipine.  She has HCTZ but has not taken recently.     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    If she bends down an gets back up she feels lightheaded for a few seconds.    Update 07/04/2018:(virtual) She feels she is doing well.  She has not had many headaches.   Her BP has done well and she is only on Norvasc 5 mg now.    She does not have any side effects and no lightheadedness.  She notes losing 17 pounds over the last year which is helped her blood pressure.  She also feels  better in general.  She has not had the floaters much in the left eye as she did last year.  Neurologically, gait, strength, sensation and coordination are doing well.  I personally reviewed the MRI reports and CTA reports from 2016 and 2017 and also looked at the images.  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  The white matter changes are much more consistent with chronic microvascular ischemic change than with demyelination.  Her last aneurysm imaging study is 2017 and we discussed the need to repeat sometime this year.  Due to the COVID-19 crisis we will put this off a couple months in the hope that imaging will be safer at that time.  Observations/Objective: She is a well-developed well-nourished woman in no acute distress.  The head is normocephalic and atraumatic.  Visible skin appears normal.  She is alert and fully oriented with fluent speech and good attention, focus and memory.  Extraocular muscles appear normal.  Facial strength and trapezius strength was normal.  Arm strength appeared normal and rapid alternating movements were performed well.  Finger-nose-finger was performed well.  Sensation, leg strength, gait and reflexes could not be evaluated today.  Assessment and Plan: No diagnosis found.  1.   In order to determine if the aneurysm is stable, we will  repeat an MRA in a couple months.  Hopefully this will be safer at that time. 2.   Continue amlodipine 5 mg.  If she loses more weight and blood pressure drops, we could consider stopping this. 3.   She will return to see me in 1 year or sooner if new or worsening symptoms.  Follow Up Instructions: I discussed the assessment and treatment plan with the patient. The patient was provided an opportunity to ask questions and all were answered. The patient agreed with the plan and demonstrated an understanding of the instructions.    The patient was advised to call back or seek an in-person evaluation if the  symptoms worsen or if the condition fails to improve as anticipated.  I provided 20 minutes of non-face-to-face time during this encounter.   ____________________________________ From previous notes Update 02/21/2017:   She feels mostly stable since the last visit though the left eye has more light sensitivity.   Vision is occ blurry out of that eye but not always.    She has had floaters in that eye.    Headaches are doing well.    Recent headaches seemed allergy related and she took Zyrtec.   She is having a HA once a week, usually across her forehead.   Left is usually worse than right.     Moving and bright lights worsen the pain and resting and Zyrtec help.      Her HTN is doing ok.    She needed to switch from valsartan to losartan.     She has not had many more dizzy spells. She does feel her gait is a little off balance at times.  RI and MRA were reviewed. She has some white matter changes, mostly in the subcortical white matter. She also has a small lacune in the left cerebellar hemisphere that appears chronic.   MRA shows a small 2 mm outpouching (left Ant comm. Art.) that could be an enlarged infundibulum or a small aneurysm.   From 02/18/2016:     Abnl MRI and aneurysm: MRI 03/11/2015 showed mild chronic microvascular ischemic changes, mostly subcortical.  MR angiogram 04/04/2015 showed a 2 mm outpouching anterior communicating artery region that could be an aneurysm or an infundibulum.   CT angiogram was then performed showing a 1.5 mm ACA aneurysm with a narrow neck.  HA:   She is only getting rare headaches now.    She has had only one bad headache since starting Diovan.  She took a Goody's and it improved.   She often gets a duller pressure like headache that she feels are due to her sinuses.    The moderate headache was right sided and throbbing with nausea, photophobia.  She generally does not get visual or other aura before the headache. A typical headache will last  about half a day and then resolve.    Advil or goodys will usually knock it out.      HTN:  She is doing much better on Diovan and last BP was 126/78.    Dizziness:   She had one episode of dizziness last month while on a stool.  This one lasted a few minutes only.   Over the past year she has had a couple episodes of dizziness lasting about 2 hours. During that time she feels off balanced. The vertigo was more translational than spinning. There was not lightheadedness.    Vascular risks:   She has HTN, now better controlled.  She does not have diabetes. She does not smoke.   She was never a smoker as she has asthma.   She denies any diagnosis heart problem. She has felt that her heart skips a beat at times.   REVIEW OF SYSTEMS: Out of a complete 14 system review of symptoms, the patient complains only of the following symptoms, and all other reviewed systems are negative.  ALLERGIES: Allergies  Allergen Reactions   Other Anaphylaxis    Nuts    Chlorhexidine Gluconate Itching    HOME MEDICATIONS: Outpatient Medications Prior to Visit  Medication Sig Dispense Refill   albuterol (VENTOLIN HFA) 108 (90 Base) MCG/ACT inhaler Inhale 2 puffs into the lungs every 6 (six) hours as needed for wheezing or shortness of breath. 18 g 1   amLODipine (NORVASC) 5 MG tablet TAKE 1 TABLET(5 MG) BY MOUTH DAILY 90 tablet 1   EPINEPHrine 0.3 mg/0.3 mL IJ SOAJ injection Inject into upper thigh for severe allergic reaction 2 each 1   fluticasone (FLONASE) 50 MCG/ACT nasal spray INSTILL 2 SPRAYS INTO EACH NOSTRIL EVERY DAY 16 g 2   mometasone-formoterol (DULERA) 100-5 MCG/ACT AERO Inhale 2 puffs into the lungs 2 (two) times daily. 13 g 5   pantoprazole (PROTONIX) 40 MG tablet Take 1 tablet (40 mg total) by mouth daily. 90 tablet 3   topiramate (TOPAMAX) 50 MG tablet Take one or two at bedtime 60 tablet 11   No facility-administered medications prior to visit.    PAST MEDICAL HISTORY: Past  Medical History:  Diagnosis Date   Allergic rhinitis, cause unspecified    Asthma    Eczema    Food allergy    Gallbladder problem    Headache    Hypertension     PAST SURGICAL HISTORY: Past Surgical History:  Procedure Laterality Date   CESAREAN SECTION     CESAREAN SECTION Bilateral [id number]    FAMILY HISTORY: Family History  Problem Relation Age of Onset   Asthma Mother    Allergies Mother    Asthma Father    Drug abuse Father    Allergies Father     SOCIAL HISTORY: Social History   Socioeconomic History   Marital status: Single    Spouse name: Not on file   Number of children: Not on file   Years of education: Not on file   Highest education level: Not on file  Occupational History   Occupation: Juvenile Counsler    Comment: guard   Tobacco Use   Smoking status: Never Smoker   Smokeless tobacco: Never Used  Substance and Sexual Activity   Alcohol use: Yes    Alcohol/week: 0.0 standard drinks    Comment: one glass of wine per week/fim   Drug use: No   Sexual activity: Yes    Partners: Male  Other Topics Concern   Not on file  Social History Narrative   Exercise--no   Social Determinants of Health   Financial Resource Strain:    Difficulty of Paying Living Expenses: Not on file  Food Insecurity:    Worried About Charity fundraiser in the Last Year: Not on file   [employer] of Food in the Last Year: Not on file  Transportation Needs:    Lack of Transportation (Medical): Not on file   Lack of Transportation (Non-Medical): Not on file  Physical Activity:    Days of Exercise per Week: Not on file   Minutes of Exercise per Session: Not on file  Stress:  Feeling of Stress : Not on file  Social Connections:    Frequency of Communication with Friends and Family: Not on file   Frequency of Social Gatherings with Friends and Family: Not on file   Attends Religious Services: Not on file   Active Member of Clubs or  Organizations: Not on file   Attends Archivist Meetings: Not on file   Marital Status: Not on file  Intimate Partner Violence:    Fear of Current or Ex-Partner: Not on file   Emotionally Abused: Not on file   Physically Abused: Not on file   Sexually Abused: Not on file      PHYSICAL EXAM  There were no vitals filed for this visit. There is no height or weight on file to calculate BMI.  Generalized: Well developed, in no acute distress  Cardiology: normal rate and rhythm, no murmur noted Respiratory: clear to auscultation bilaterally  Neurological examination  Mentation: Alert oriented to time, place, history taking. Follows all commands speech and language fluent Cranial nerve II-XII: Pupils were equal round reactive to light. Extraocular movements were full, visual field were full on confrontational test. Facial sensation and strength were normal. Uvula tongue midline. Head turning and shoulder shrug  were normal and symmetric. Motor: The motor testing reveals 5 over 5 strength of all 4 extremities. Good symmetric motor tone is noted throughout.  Sensory: Sensory testing is intact to soft touch on all 4 extremities. No evidence of extinction is noted.  Coordination: Cerebellar testing reveals good finger-nose-finger and heel-to-shin bilaterally.  Gait and station: Gait is normal. Tandem gait is normal. Romberg is negative. No drift is seen.  Reflexes: Deep tendon reflexes are symmetric and normal bilaterally.   DIAGNOSTIC DATA (LABS, IMAGING, TESTING) - I reviewed patient records, labs, notes, testing and imaging myself where available.  No flowsheet data found.   Lab Results  Component Value Date   WBC 4.7 01/04/2020   HGB 14.3 01/04/2020   HCT 43.0 01/04/2020   MCV 84.5 01/04/2020   PLT 253 01/04/2020      Component Value Date/Time   NA 144 01/04/2020 1019   NA 144 10/05/2018 1004   K 3.8 01/04/2020 1019   CL 108 01/04/2020 1019   CO2 26 01/04/2020  1019   GLUCOSE 100 (H) 01/04/2020 1019   BUN 16 01/04/2020 1019   BUN 16 10/05/2018 1004   CREATININE 1.01 01/04/2020 1019   CALCIUM 9.8 01/04/2020 1019   PROT 7.9 01/04/2020 1019   PROT 7.2 10/05/2018 1004   ALBUMIN 3.7 07/21/2019 0937   ALBUMIN 4.3 10/05/2018 1004   AST 33 01/04/2020 1019   ALT 40 (H) 01/04/2020 1019   ALKPHOS 107 07/21/2019 0937   BILITOT 0.4 01/04/2020 1019   BILITOT 0.3 10/05/2018 1004   GFRNONAA 55 (L) 07/21/2019 0937   GFRAA >60 07/21/2019 0937   Lab Results  Component Value Date   CHOL 197 01/04/2020   HDL 57 01/04/2020   LDLCALC 123 (H) 01/04/2020   TRIG 77 01/04/2020   CHOLHDL 3.5 01/04/2020   Lab Results  Component Value Date   HGBA1C 5.6 03/05/2019   Lab Results  Component Value Date   VITAMINB12 638 07/21/2017   Lab Results  Component Value Date   TSH 1.720 04/13/2018       ASSESSMENT AND PLAN 59 y.o. year old female  has a past medical history of Allergic rhinitis, cause unspecified, Asthma, Eczema, Food allergy, Gallbladder problem, Headache, and Hypertension. here with ***  No diagnosis found.     No orders of the defined types were placed in this encounter.    No orders of the defined types were placed in this encounter.     I spent 15 minutes with the patient. 50% of this time was spent counseling and educating patient on plan of care and medications.    Debbora Presto, FNP-C 01/08/2020, 4:05 PM [facility] [address], [place], [place] [address] [phone]

## 2020-01-09 ENCOUNTER — Ambulatory Visit: Payer: BC Managed Care – PPO | Admitting: Family Medicine

## 2020-01-09 ENCOUNTER — Encounter: Payer: Self-pay | Admitting: Family Medicine

## 2020-01-09 DIAGNOSIS — G43009 Migraine without aura, not intractable, without status migrainosus: Secondary | ICD-10-CM

## 2020-01-09 DIAGNOSIS — Z8679 Personal history of other diseases of the circulatory system: Secondary | ICD-10-CM

## 2020-01-09 DIAGNOSIS — I609 Nontraumatic subarachnoid hemorrhage, unspecified: Secondary | ICD-10-CM

## 2020-01-22 ENCOUNTER — Encounter: Payer: Self-pay | Admitting: Family Medicine

## 2020-01-22 NOTE — Telephone Encounter (Signed)
Weight loss is a side effect of topamax She should call [facility] back --- they should have called her with that app

## 2020-01-22 NOTE — Telephone Encounter (Signed)
It looks like [facility] had her down for 01/09/20.  She had to have to have been called to have made that appt.    Can you look into her other question about the Topamax.

## 2020-01-28 ENCOUNTER — Encounter: Payer: Self-pay | Admitting: Neurology

## 2020-03-17 ENCOUNTER — Telehealth: Payer: Self-pay

## 2020-03-17 NOTE — Telephone Encounter (Signed)
PA initiated via Covermymeds; KEY: [id number]. PA approved.   This request has been approved using information available on the patient's profile. CaseId:[id number];Status:Approved;Review Type:Prior Auth;Coverage Start Date:02/16/2020;Coverage End Date:03/17/2023;

## 2020-03-18 ENCOUNTER — Other Ambulatory Visit: Payer: Self-pay | Admitting: Family Medicine

## 2020-03-18 ENCOUNTER — Telehealth: Payer: Self-pay | Admitting: Family Medicine

## 2020-03-18 MED ORDER — BREO ELLIPTA 100-25 MCG/INH IN AEPB: 1.0000 | INHALATION_SPRAY | Freq: Every day | RESPIRATORY_TRACT | 5 refills | Status: DC

## 2020-03-18 NOTE — Telephone Encounter (Signed)
Patient called in reference to her inhaler mometasone-formoterol ([place]) 100-5 MCG/ACT AERO [[id number]]   per patient no drug store has the medication, per pharmacy the inhaler is on back order. Patient would like Dr Etter Sjogren to send in another medication similar to the inhaler she current takes. Patient has been out of inhaler for three weeks.  Please Advise   Patient is using [id number] - [place], [place] - [address] AT [facility] [address]  [address], [place] [address]  Phone:  [phone] Fax:  [phone]  DEA #:  [id number]

## 2020-03-18 NOTE — Telephone Encounter (Signed)
Breo sent to pharmacy It is only qd

## 2020-03-18 NOTE — Telephone Encounter (Signed)
Pt called. Notified of inhale sent to pharmacy

## 2020-03-19 ENCOUNTER — Telehealth: Payer: Self-pay

## 2020-03-19 NOTE — Telephone Encounter (Addendum)
PA initiated via Covermymeds; KEY: [id number]. Awaiting determination.

## 2020-03-19 NOTE — Telephone Encounter (Signed)
Astrid,  from [employer] call for prior Authorization phone number [phone]

## 2020-03-20 NOTE — Telephone Encounter (Signed)
PA approved.   This request has been approved using information available on the patient's profile. [id number];HTDSKA:[id number];Review Type:Prior Auth;Coverage Start Date:02/19/2020;Coverage End Date:03/20/2023;

## 2020-04-06 ENCOUNTER — Other Ambulatory Visit: Payer: Self-pay | Admitting: Family Medicine

## 2020-04-07 NOTE — Progress Notes (Signed)
NEUROLOGY CONSULTATION NOTE  LONDON GABHART MRN: [id number] DOB: 1960/11/13  Referring provider: Seabron Spates, DO Primary care provider: Seabron Spates, DO  Reason for consult:  migraines   Subjective:  Ruth [name]. 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  History supplemented by hospital records and prior neurologists' records.    She has longstanding history of headaches since young adulthood, which she says have been attributed to allergies and sinus issues. In 2016, she reported different type of headache.  They were right sided, throbbing/pressure and 8/10 intensity.  At that time, they lasted all day and occurred daily.  It is associated with nausea, photophobia, phonophobia and osmophobia.  She also reported seeing floaters but her eye doctor said it was normal for her age.  She also feels a little dizzy.  She would Advil or Tylenol almost daily, with variable effect.  She denies family history of headache. She then has had episodes of severe dizziness, not associated with headache.  It is not spinning but it is a sense of movement and feeling off-balance.  There is mild nausea but no vomiting.  It occurs spontaneously throughout the day and each episode can last up to 4 hours non-stop.  She noted some bilateral tinnitus.  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  She subsequently established care with another neurologist, Dr. Epimenio Foot, who recommended close follow up.  She was lost to follow up with Dr. Epimenio Foot due to the COVID-19 pandemic until she saw him again in April 2021 and started her on topiramate.  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    Prior to the aneurysm rupture, migraines were infrequent,about once a month, and responded within 1 hour with Tylenol.  Since the surgery, she has had some head pain associated with the surgery but no migraines.  However, she has had unwanted side effects from topiramate, such as blurred vision and appetite suppression, causing 30 lb weight loss.  She would like to discontinue it.  Imaging personally reviewed: 03/11/2015 MRI BRAIN W WO:  1. No acute intracranial abnormality.  2. Mild to moderate for age nonspecific mostly subcortical cerebral white matter signal changes. Solitary small nonspecific focus of T2 hyperintensity in the cerebellum, most resembling a chronic lacune. 04/16/2015 CTA HEAD:  1. 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  2. Otherwise normal CTA circle of Willis without focal stenosis, other aneurysm, or branch vessel occlusion.  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 Mild extent of white matter foci in the subcortical white matter in the left cerebral hemisphere consistent with mild chronic microvascular ischemic changes. None of theseappear to be acute and there is no change when compared to the MRI dated 03/11/2015.  07/21/2019 CT BRAIN WO: 1. There are findings concerning for potential subarachnoid hemorrhage, although this could simply reflect "pseudo subarachnoid hemorrhage" related to interval development of mild hydrocephalus compared to prior study from 2017. Given the patient's documented history of ACA aneurysm, further evaluation with repeat head CTA is recommended at this  time. 07/21/2019 CTA HEAD & NECK:  1. 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  Trace subarachnoid hemorrhage including trace IVH visible now. Mild proximal ACA  vasospasm. Acute ventriculomegaly, although no definite transependymal edema.  Note also dominant Left ACA A1. 2. Elsewhere stable and negative CTA head and neck. No atherosclerosis or arterial stenosis identified.  Current NSAIDS/analgesics:  Tylenol, rarely ibuprofen Current triptans:  none Current ergotamine:  none Current anti-emetic:  none Current muscle relaxants:  none Current Antihypertensive medications:  amlodipine Current Antidepressant medications:  none Current Anticonvulsant medications:  topiramate 50mg  at bedtime Current anti-CGRP:  none Current Vitamins/Herbal/Supplements:  none Current Antihistamines/Decongestants:  Flonase Other therapy:  none Hormone/birth control:  none  Past NSAIDS/analgesics:  none Past abortive triptans:  none Past abortive ergotamine:  none Past muscle relaxants:  none Past anti-emetic:  Zofran ODT 4mg  Past antihypertensive medications:  Metoprolol succinate, losartan, valsartain, HCTZ Past antidepressant medications:  Sertraline 25mg , Wellbutrin Past anticonvulsant medications:  none Past anti-CGRP:  none Past vitamins/Herbal/Supplements:  none Past antihistamines/decongestants:  none Other past therapies:  none  Caffeine:  Sweet tea daily.  No coffee or cola Diet:  Drinks water.  Usually wouldn't skip meals but she has since topiramate Exercise:  Not recently Depression: no ; Anxiety:  yes Other pain:  Localized head pain from surgery Sleep hygiene:  Sleeps okay.  Feels tired Family history of headache or aneurysm:  no      PAST MEDICAL HISTORY: Past Medical History:  Diagnosis Date  . Allergic rhinitis, cause unspecified   . Asthma   . Eczema   . Food allergy   . Gallbladder problem   . Headache   . Hypertension     PAST SURGICAL HISTORY: Past Surgical History:  Procedure Laterality Date  . CESAREAN SECTION    . CESAREAN SECTION Bilateral [id number]    MEDICATIONS: Current Outpatient Medications on File Prior to  Visit  Medication Sig Dispense Refill  . albuterol (VENTOLIN HFA) 108 (90 Base) MCG/ACT inhaler Inhale 2 puffs into the lungs every 6 (six) hours as needed for wheezing or shortness of breath. 18 g 1  . amLODipine (NORVASC) 5 MG tablet TAKE 1 TABLET(5 MG) BY MOUTH DAILY 90 tablet 1  . EPINEPHrine 0.3 mg/0.3 mL IJ SOAJ injection Inject into upper thigh for severe allergic reaction 2 each 1  . fluticasone (FLONASE) 50 MCG/ACT nasal spray INSTILL 2 SPRAYS INTO EACH NOSTRIL EVERY DAY 16 g 2  . fluticasone furoate-vilanterol (BREO ELLIPTA) 100-25 MCG/INH AEPB Inhale 1 puff into the lungs daily. 1 each 5  . pantoprazole (PROTONIX) 40 MG tablet Take 1 tablet (40 mg total) by mouth daily. 90 tablet 3  . topiramate (TOPAMAX) 50 MG tablet Take one or two at bedtime 60 tablet 11   No current facility-administered medications on file prior to visit.    ALLERGIES: Allergies  Allergen Reactions  . Other Anaphylaxis    Nuts   . Chlorhexidine Gluconate Itching    FAMILY HISTORY: Family History  Problem Relation Age of Onset  . Asthma Mother   . Allergies Mother   . Asthma Father   . Drug abuse Father   . Allergies Father     SOCIAL HISTORY: Social History   Socioeconomic History  . Marital status: Single    Spouse name: Not on file  . Number of children: Not on file  . Years of education: Not on file  .  Highest education level: Not on file  Occupational History  . Occupation: Scientist, forensic    Comment: guard   Tobacco Use  . Smoking status: Never Smoker  . Smokeless tobacco: Never Used  Substance and Sexual Activity  . Alcohol use: Yes    Alcohol/week: 0.0 standard drinks    Comment: one glass of wine per week/fim  . Drug use: No  . Sexual activity: Yes    Partners: Male  Other Topics Concern  . Not on file  Social History Narrative   Exercise--no   Social Determinants of Health   Financial Resource Strain: Not on file  Food Insecurity: Not on file  Transportation  Needs: Not on file  Physical Activity: Not on file  Stress: Not on file  Social Connections: Not on file  Intimate Partner Violence: Not on file    Objective:  Blood pressure 124/86, pulse 100, height 5\' 5"  (1.651 m), weight 195 lb 9.6 oz (88.7 kg), last menstrual period 09/19/2015, SpO2 99 %. General: No acute distress.  Patient appears well-groomed.   Head:  Normocephalic/atraumatic Eyes:  fundi examined but not visualized Neck: supple, no paraspinal tenderness, full range of motion Back: No paraspinal tenderness Heart: regular rate and rhythm Lungs: Clear to auscultation bilaterally. Vascular: No carotid bruits. Neurological Exam: Mental status: alert and oriented to person, place, and time, recent and remote memory intact, fund of knowledge intact, attention and concentration intact, speech fluent and not dysarthric, language intact. Cranial nerves: CN I: not tested CN II: pupils equal, round and reactive to light, visual fields intact CN III, IV, VI:  full range of motion, no nystagmus, no ptosis CN V: facial sensation intact. CN VII: upper and lower face symmetric CN VIII: hearing intact CN IX, X: gag intact, uvula midline CN XI: sternocleidomastoid and trapezius muscles intact CN XII: tongue midline Bulk & Tone: normal, no fasciculations. Motor:  muscle strength 5/5 throughout Sensation:  Pinprick, temperature and vibratory sensation intact. Deep Tendon Reflexes:  2+ throughout,  toes downgoing.   Finger to nose testing:  Without dysmetria.   Heel to shin:  Without dysmetria.   Gait:  Normal station and stride.  Romberg negative.  Assessment/Plan:   1.  History of cerebral aneurysm rupture s/p clipping.  Doing well 2.  Migraine without aura, without status migrainosus, not intractable.  As her migraines are infrequent and manageable, she does not need to be on a preventative.  She will discontinue topiramate.  1.  She will discontinue topiramate.  If migraines should  happen to increase in frequency, we can always start an alternative medication. 2. Use Tylenol as needed.  Limit use of pain relievers to no more than 2 days out of week to prevent risk of rebound or medication-overuse headache. 3.  Keep headache diary 4.  Follow up with [facility] neurosurgery as scheduled. 5.  Follow up with me in 6 months.    Thank you for allowing me to take part in the care of this patient.  Metta Clines, DO  CC: Roma Schanz, DO

## 2020-04-08 ENCOUNTER — Ambulatory Visit (INDEPENDENT_AMBULATORY_CARE_PROVIDER_SITE_OTHER): Payer: Managed Care, Other (non HMO) | Admitting: Neurology

## 2020-04-08 ENCOUNTER — Other Ambulatory Visit: Payer: Self-pay

## 2020-04-08 ENCOUNTER — Encounter: Payer: Self-pay | Admitting: Neurology

## 2020-04-08 VITALS — BP 124/86 | HR 100 | Ht 65.0 in | Wt 195.6 lb

## 2020-04-08 DIAGNOSIS — Z8679 Personal history of other diseases of the circulatory system: Secondary | ICD-10-CM | POA: Diagnosis not present

## 2020-04-08 DIAGNOSIS — G43009 Migraine without aura, not intractable, without status migrainosus: Secondary | ICD-10-CM

## 2020-04-08 NOTE — Patient Instructions (Signed)
  1. You may discontinue topiramate. 2. Limit use of pain relievers to no more than 2 days out of the week.  These medications include acetaminophen, NSAIDs (ibuprofen/Advil/Motrin, naproxen/Aleve, triptans (Imitrex/sumatriptan), Excedrin, and narcotics.  This will help reduce risk of rebound headaches. 3. Be aware of common food triggers:  - Caffeine:  coffee, black tea, cola, Mt. Dew  - Chocolate  - Dairy:  aged cheeses (brie, blue, cheddar, gouda, [place], provolone, [place], Swiss, etc), chocolate milk, buttermilk, sour cream, limit eggs and yogurt  - Nuts, peanut butter  - Alcohol  - Cereals/grains:  FRESH breads (fresh bagels, sourdough, doughnuts), yeast productions  - Processed/canned/aged/cured meats (pre-packaged deli meats, hotdogs)  - MSG/glutamate:  soy sauce, flavor enhancer, pickled/preserved/marinated foods  - Sweeteners:  aspartame (Equal, Nutrasweet).  Sugar and Splenda are okay  - Vegetables:  legumes (lima beans, lentils, snow peas, fava beans, pinto peans, peas, garbanzo beans), sauerkraut, onions, olives, pickles  - Fruit:  avocados, bananas, citrus fruit (orange, lemon, grapefruit), mango  - Other:  Frozen meals, macaroni and cheese 4. Routine exercise 5. Stay adequately hydrated (aim for 64 oz water daily) 6. Keep headache diary 7. Maintain proper stress management 8. Maintain proper sleep hygiene 9. Do not skip meals 10. Consider supplements:  magnesium citrate 400mg  daily, riboflavin 400mg  daily, coenzyme Q10 100mg  three times daily. 11. Follow up with neurosurgery as scheduled.

## 2020-04-16 ENCOUNTER — Telehealth: Payer: Self-pay | Admitting: Neurology

## 2020-04-16 NOTE — Telephone Encounter (Signed)
Patient saw Dr Tomi Likens on 04-08-20 and tested positive for COVID this morning. She wants to know what she can take and what she should not take. She has a headache please advise

## 2020-04-16 NOTE — Telephone Encounter (Signed)
Tylenol.

## 2020-04-17 NOTE — Telephone Encounter (Signed)
Called patient and informed her that Dr. Tomi Likens has advised Tylenol is what she can take. Patient verbalized understanding.

## 2020-06-24 ENCOUNTER — Encounter: Payer: Managed Care, Other (non HMO) | Admitting: Family Medicine

## 2020-06-26 LAB — RESULTS CONSOLE HPV: CHL HPV: NEGATIVE

## 2020-06-26 LAB — HM PAP SMEAR: HM Pap smear: NORMAL

## 2020-07-07 ENCOUNTER — Encounter: Payer: Self-pay | Admitting: Family Medicine

## 2020-07-07 ENCOUNTER — Ambulatory Visit (INDEPENDENT_AMBULATORY_CARE_PROVIDER_SITE_OTHER): Payer: Managed Care, Other (non HMO) | Admitting: Family Medicine

## 2020-07-07 ENCOUNTER — Other Ambulatory Visit: Payer: Self-pay

## 2020-07-07 VITALS — BP 126/80 | HR 82 | Temp 98.4°F | Resp 18 | Ht 65.0 in | Wt 193.4 lb

## 2020-07-07 DIAGNOSIS — I10 Essential (primary) hypertension: Secondary | ICD-10-CM | POA: Diagnosis not present

## 2020-07-07 DIAGNOSIS — L309 Dermatitis, unspecified: Secondary | ICD-10-CM | POA: Diagnosis not present

## 2020-07-07 DIAGNOSIS — H1013 Acute atopic conjunctivitis, bilateral: Secondary | ICD-10-CM | POA: Diagnosis not present

## 2020-07-07 DIAGNOSIS — Z0001 Encounter for general adult medical examination with abnormal findings: Secondary | ICD-10-CM | POA: Diagnosis not present

## 2020-07-07 DIAGNOSIS — J4541 Moderate persistent asthma with (acute) exacerbation: Secondary | ICD-10-CM

## 2020-07-07 DIAGNOSIS — Z0000 Encounter for general adult medical examination without abnormal findings: Secondary | ICD-10-CM | POA: Insufficient documentation

## 2020-07-07 DIAGNOSIS — Z6837 Body mass index (BMI) 37.0-37.9, adult: Secondary | ICD-10-CM

## 2020-07-07 DIAGNOSIS — G3281 Cerebellar ataxia in diseases classified elsewhere: Secondary | ICD-10-CM | POA: Diagnosis not present

## 2020-07-07 MED ORDER — AZELASTINE HCL 0.05 % OP SOLN: 1.0000 [drp] | Freq: Two times a day (BID) | OPHTHALMIC | 12 refills | Status: DC

## 2020-07-07 NOTE — Progress Notes (Signed)
Patient ID: Ruth [name], female    DOB: Jul 31, 1960  Age: 60 y.o. MRN: [id number]    Subjective:  Subjective  HPI Ruth [name] presents for a comprehensive physical examination today. She complains of environmental allergies. She states that her allergy has worsen recently due to the pollen. She also complains of bilateral eye itching secondary to environmental allergy. She also complains of eczema on bilateral wrist. She notes that it maybe the result from her wearing her apple watch. She denies any chest pain, SOB, fever, abdominal pain, cough, chills, sore throat, dysuria, urinary incontinence, back pain, HA, or N/V/D at this time. She endorses exercising x3 day a week for one hour as well as, walking x5 days a week.   Review of Systems  Constitutional: Negative for chills, fatigue and fever.  HENT: Negative for ear pain, hearing loss, sinus pressure, sinus pain and sore throat.   Eyes: Positive for itching (secondary to allergies ). Negative for pain.  Respiratory: Negative for cough and shortness of breath.   Cardiovascular: Negative for chest pain, palpitations and leg swelling.  Gastrointestinal: Negative for abdominal pain, blood in stool, constipation, diarrhea, nausea and vomiting.  Genitourinary: Negative for dysuria, frequency, hematuria and urgency.  Musculoskeletal: Negative for back pain.  Skin:       (+) bilateral wrist eczema   Allergic/Immunologic: Positive for environmental allergies.  Neurological: Negative for dizziness, weakness and headaches.    History Past Medical History:  Diagnosis Date  . Allergic rhinitis, cause unspecified   . Asthma   . Eczema   . Food allergy   . Gallbladder problem   . Headache   . Hypertension     She has a past surgical history that includes Cesarean section and Cesarean section (Bilateral, [id number]).   Her family history includes Allergies in her father and mother; Asthma in her father and mother; Drug abuse in her  father.She reports that she has never smoked. She has never used smokeless tobacco. She reports current alcohol use. She reports that she does not use drugs.  Current Outpatient Medications on File Prior to Visit  Medication Sig Dispense Refill  . albuterol (VENTOLIN HFA) 108 (90 Base) MCG/ACT inhaler Inhale 2 puffs into the lungs every 6 (six) hours as needed for wheezing or shortness of breath. 18 g 1  . amLODipine (NORVASC) 5 MG tablet Take 1 tablet (5 mg total) by mouth daily. 90 tablet 1  . EPINEPHrine 0.3 mg/0.3 mL IJ SOAJ injection Inject into upper thigh for severe allergic reaction 2 each 1  . fluticasone (FLONASE) 50 MCG/ACT nasal spray INSTILL 2 SPRAYS INTO EACH NOSTRIL EVERY DAY 16 g 2  . fluticasone furoate-vilanterol (BREO ELLIPTA) 100-25 MCG/INH AEPB Inhale 1 puff into the lungs daily. 1 each 5  . pantoprazole (PROTONIX) 40 MG tablet Take 1 tablet (40 mg total) by mouth daily. 90 tablet 3   No current facility-administered medications on file prior to visit.     Objective:  Objective  Physical Exam Vitals and nursing note reviewed.  Constitutional:      General: She is not in acute distress.    Appearance: Normal appearance. She is well-developed. She is not ill-appearing.  HENT:     Head: Normocephalic and atraumatic.     Right Ear: External ear normal.     Left Ear: External ear normal.     Nose: Nose normal.  Eyes:     Extraocular Movements: Extraocular movements intact.     Pupils: Pupils are  equal, round, and reactive to light.  Cardiovascular:     Rate and Rhythm: Normal rate and regular rhythm.     Pulses: Normal pulses.     Heart sounds: Normal heart sounds. No murmur heard. No friction rub. No gallop.   Pulmonary:     Effort: Pulmonary effort is normal. No respiratory distress.     Breath sounds: Normal breath sounds. No stridor. No wheezing, rhonchi or rales.  Chest:     Chest wall: No tenderness.  Abdominal:     General: Bowel sounds are normal. There  is no distension.     Palpations: Abdomen is soft.     Tenderness: There is no abdominal tenderness. There is no guarding or rebound.     Hernia: No hernia is present.  Musculoskeletal:        General: Normal range of motion.     Cervical back: Normal range of motion and neck supple.  Skin:    General: Skin is warm and dry.     Findings: Rash present.       Neurological:     Mental Status: She is alert and oriented to person, place, and time.  Psychiatric:        Behavior: Behavior normal.        Thought Content: Thought content normal.    BP 126/80 (BP Location: Left Arm, Patient Position: Sitting, Cuff Size: Large)   Pulse 82   Temp 98.4 F (36.9 C)   Resp 18   Ht 5\' 5"  (1.651 m)   Wt 193 lb 6.4 oz (87.7 kg)   LMP 09/19/2015   SpO2 99%   BMI 32.18 kg/m  Wt Readings from Last 3 Encounters:  07/07/20 193 lb 6.4 oz (87.7 kg)  04/08/20 195 lb 9.6 oz (88.7 kg)  01/04/20 205 lb 3.2 oz (93.1 kg)     Lab Results  Component Value Date   WBC 4.7 01/04/2020   HGB 14.3 01/04/2020   HCT 43.0 01/04/2020   PLT 253 01/04/2020   GLUCOSE 100 (H) 01/04/2020   CHOL 197 01/04/2020   TRIG 77 01/04/2020   HDL 57 01/04/2020   LDLCALC 123 (H) 01/04/2020   ALT 40 (H) 01/04/2020   AST 33 01/04/2020   NA 144 01/04/2020   K 3.8 01/04/2020   CL 108 01/04/2020   CREATININE 1.01 01/04/2020   BUN 16 01/04/2020   CO2 26 01/04/2020   TSH 1.720 04/13/2018   HGBA1C 5.6 03/05/2019   MICROALBUR 1.1 07/03/2012    CT Angio Head W or Wo Contrast  Addendum Date: 07/21/2019   ADDENDUM REPORT: 07/21/2019 11:37 ADDENDUM: Critical Value/emergent results were called by telephone at the time of interpretation on 07/21/2019 at 1122 hours to Dr. Gareth Morgan , who verbally acknowledged these results. Electronically Signed   By: Genevie Ann M.D.   On: 07/21/2019 11:37   Result Date: 07/21/2019 CLINICAL DATA:  60 year old female with episode about 1 week ago of severe headache and neck pain after working  in yard. History of small anterior communicating artery aneurysm. Abnormal plain head CT today. EXAM: CT ANGIOGRAPHY HEAD AND NECK TECHNIQUE: Multidetector CT imaging of the head and neck was performed using the standard protocol during bolus administration of intravenous contrast. Multiplanar CT image reconstructions and MIPs were obtained to evaluate the vascular anatomy. Carotid stenosis measurements (when applicable) are obtained utilizing NASCET criteria, using the distal internal carotid diameter as the denominator. CONTRAST:  179mL OMNIPAQUE IOHEXOL 350 MG/ML SOLN COMPARISON:  Plain  head CT 0836 hours today, CTA head 04/16/2015 and intracranial MRA 04/04/2015. FINDINGS: CT HEAD Brain: As described earlier today, ventricle size has enlarged since 2017, and there is now trace subarachnoid hemorrhage suspected layering in both occipital horns (series 6, image 14). No definite transependymal edema. Suspicious hyperdense sulci again noted (a especially left pericallosal sulci on sagittal image 35) compatible with trace subarachnoid hemorrhage. Stable gray-white matter differentiation throughout the brain. No cortically based acute infarct identified. No midline shift. Basilar cisterns remain patent. Calvarium and skull base: Stable, negative. Paranasal sinuses: Stable in clear. Orbits: No acute orbit or scalp soft tissue finding. CTA NECK Skeleton: No acute osseous abnormality identified. Upper chest: Negative. Other neck: Negative; the glottis is closed. Aortic arch: 3 vessel arch configuration with no arch atherosclerosis. Right carotid system: Proximal right CCA partially obscured by dense right subclavian vein contrast streak artifact. Visible right CCA, along with the right carotid bifurcation and cervical right ICA are negative. Left carotid system: Negative aside from tortuosity. Vertebral arteries: A portion of the proximal right subclavian artery is obscured by dense subclavian vein contrast but the right  vertebral artery origin is patent and normal. Some of the right V1 segment is obscured, but otherwise the right vertebral artery is patent with occasional tortuosity to the skull base. No plaque or stenosis. Normal proximal left subclavian artery, left vertebral artery origin. Intermittently tortuous but otherwise normal left vertebral to the skull base. CTA HEAD Posterior circulation: Codominant distal vertebral arteries are patent without stenosis. Normal PICA origins and vertebrobasilar junction. Patent basilar artery without stenosis. Normal SCA and PCA origins. A left posterior communicating artery is present, the right is diminutive or absent as before. Bilateral PCA branches are stable and within normal limits. Anterior circulation: Both ICA siphons are patent without plaque or stenosis. The right siphon appears dominant as before. Patent carotid termini. Dominant left ACA A1, unchanged. Substantially enlarged and now irregular appearing anterior communicating artery aneurysm, up to 6 mm now, only 1-2 mm previously. See series 11, images 191 and 193, series 16, image 20, series 14, image 107, and series 15, image 49. The A2 segments remain patent. There is mild proximal A2 segment irregularity likely vasospasm. Distal ACA branches appears stable and within normal limits. Left MCA M1 segment and bifurcation are patent without stenosis. Right MCA M1 segment and bifurcation are patent without stenosis. Bilateral MCA branches are stable and within normal limits. Venous sinuses: Patent, the left transverse and sigmoid sinuses appear dominant as before. Anatomic variants: Dominant left ACA A1 and mildly dominant left ICA. Review of the MIP images confirms the above findings IMPRESSION: 1. 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 Trace subarachnoid hemorrhage including trace IVH visible now. Mild proximal ACA  vasospasm. Acute ventriculomegaly, although no definite transependymal edema. Note also dominant Left ACA A1. 2. Elsewhere stable and negative CTA head and neck. No atherosclerosis or arterial stenosis identified. Electronically Signed: By: Genevie Ann M.D. On: 07/21/2019 11:12   CT Head Wo Contrast  Result Date: 07/21/2019 CLINICAL DATA:  60 year old female with history of headache. Right-sided neck pain. EXAM: CT HEAD WITHOUT CONTRAST TECHNIQUE: Contiguous axial images were obtained from the base of the skull through the vertex without intravenous contrast. COMPARISON:  Head CT 04/16/2015. FINDINGS: Brain: Subtle areas of hyperattenuation are noted within the left Sylvian fissure (axial image 14 of series 2 and within sulci (sagittal image 40 of series 5), which  could represent a trace amount of subarachnoid hemorrhage, or could be so-called "pseudo subarachnoid hemorrhage". When compared to the prior examination from 2017 there appears to be mild hydrocephalus, as evidenced by increased prominence of the temporal horns of the lateral ventricles bilaterally which were previously barely discernible. No discrete mass. No definite evidence to suggest acute cerebral infarction. Vascular: No hyperdense vessel or unexpected calcification. Skull: Normal. Negative for fracture or focal lesion. Sinuses/Orbits: No acute finding. Other: None. IMPRESSION: 1. There are findings concerning for potential subarachnoid hemorrhage, although this could simply reflect "pseudo subarachnoid hemorrhage" related to interval development of mild hydrocephalus compared to prior study from 2017. Given the patient's documented history of ACA aneurysm, further evaluation with repeat head CTA is recommended at this time. Critical Value/emergent results were called by telephone at the time of interpretation on 07/21/2019 at 9:17 am to provider [facility], who verbally acknowledged these results. Electronically Signed   By: Vinnie Langton  M.D.   On: 07/21/2019 09:19   CT Angio Neck W and/or Wo Contrast  Addendum Date: 07/21/2019   ADDENDUM REPORT: 07/21/2019 11:37 ADDENDUM: Critical Value/emergent results were called by telephone at the time of interpretation on 07/21/2019 at 1122 hours to Dr. Gareth Morgan , who verbally acknowledged these results. Electronically Signed   By: Genevie Ann M.D.   On: 07/21/2019 11:37   Result Date: 07/21/2019 CLINICAL DATA:  60 year old female with episode about 1 week ago of severe headache and neck pain after working in yard. History of small anterior communicating artery aneurysm. Abnormal plain head CT today. EXAM: CT ANGIOGRAPHY HEAD AND NECK TECHNIQUE: Multidetector CT imaging of the head and neck was performed using the standard protocol during bolus administration of intravenous contrast. Multiplanar CT image reconstructions and MIPs were obtained to evaluate the vascular anatomy. Carotid stenosis measurements (when applicable) are obtained utilizing NASCET criteria, using the distal internal carotid diameter as the denominator. CONTRAST:  115mL OMNIPAQUE IOHEXOL 350 MG/ML SOLN COMPARISON:  Plain head CT 0836 hours today, CTA head 04/16/2015 and intracranial MRA 04/04/2015. FINDINGS: CT HEAD Brain: As described earlier today, ventricle size has enlarged since 2017, and there is now trace subarachnoid hemorrhage suspected layering in both occipital horns (series 6, image 14). No definite transependymal edema. Suspicious hyperdense sulci again noted (a especially left pericallosal sulci on sagittal image 35) compatible with trace subarachnoid hemorrhage. Stable gray-white matter differentiation throughout the brain. No cortically based acute infarct identified. No midline shift. Basilar cisterns remain patent. Calvarium and skull base: Stable, negative. Paranasal sinuses: Stable in clear. Orbits: No acute orbit or scalp soft tissue finding. CTA NECK Skeleton: No acute osseous abnormality identified. Upper  chest: Negative. Other neck: Negative; the glottis is closed. Aortic arch: 3 vessel arch configuration with no arch atherosclerosis. Right carotid system: Proximal right CCA partially obscured by dense right subclavian vein contrast streak artifact. Visible right CCA, along with the right carotid bifurcation and cervical right ICA are negative. Left carotid system: Negative aside from tortuosity. Vertebral arteries: A portion of the proximal right subclavian artery is obscured by dense subclavian vein contrast but the right vertebral artery origin is patent and normal. Some of the right V1 segment is obscured, but otherwise the right vertebral artery is patent with occasional tortuosity to the skull base. No plaque or stenosis. Normal proximal left subclavian artery, left vertebral artery origin. Intermittently tortuous but otherwise normal left vertebral to the skull base. CTA HEAD Posterior circulation: Codominant distal vertebral arteries are patent without stenosis. Normal  PICA origins and vertebrobasilar junction. Patent basilar artery without stenosis. Normal SCA and PCA origins. A left posterior communicating artery is present, the right is diminutive or absent as before. Bilateral PCA branches are stable and within normal limits. Anterior circulation: Both ICA siphons are patent without plaque or stenosis. The right siphon appears dominant as before. Patent carotid termini. Dominant left ACA A1, unchanged. Substantially enlarged and now irregular appearing anterior communicating artery aneurysm, up to 6 mm now, only 1-2 mm previously. See series 11, images 191 and 193, series 16, image 20, series 14, image 107, and series 15, image 49. The A2 segments remain patent. There is mild proximal A2 segment irregularity likely vasospasm. Distal ACA branches appears stable and within normal limits. Left MCA M1 segment and bifurcation are patent without stenosis. Right MCA M1 segment and bifurcation are patent without  stenosis. Bilateral MCA branches are stable and within normal limits. Venous sinuses: Patent, the left transverse and sigmoid sinuses appear dominant as before. Anatomic variants: Dominant left ACA A1 and mildly dominant left ICA. Review of the MIP images confirms the above findings IMPRESSION: 1. 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 Trace subarachnoid hemorrhage including trace IVH visible now. Mild proximal ACA vasospasm. Acute ventriculomegaly, although no definite transependymal edema. Note also dominant Left ACA A1. 2. Elsewhere stable and negative CTA head and neck. No atherosclerosis or arterial stenosis identified. Electronically Signed: By: Genevie Ann M.D. On: 07/21/2019 11:12     Assessment & Plan:  Plan    Meds ordered this encounter  Medications  . azelastine (OPTIVAR) 0.05 % ophthalmic solution    Sig: Place 1 drop into both eyes 2 (two) times daily.    Dispense:  6 mL    Refill:  12    Problem List Items Addressed This Visit      Unprioritized   Cerebellar ataxia in diseases classified elsewhere ([place])    stable      Class 2 severe obesity with serious comorbidity and body mass index (BMI) of 37.0 to 37.9 in adult [facility])    Pt doing well with weight loss and exercises daily      Essential hypertension    Well controlled, no changes to meds. Encouraged heart healthy diet such as the DASH diet and exercise as tolerated.       HTN (hypertension)   Relevant Orders   Lipid panel   CBC with Differential/Platelet   TSH   Comprehensive metabolic panel   Moderate persistent asthma with acute exacerbation    stable      Preventative health care - Primary    ghm utd Check labs  Pt wants to hold off on shingrix and covid booster      Relevant Orders   Lipid panel   CBC with Differential/Platelet   TSH   Comprehensive metabolic panel    Other Visit Diagnoses     Allergic conjunctivitis of both eyes       Relevant Medications   azelastine (OPTIVAR) 0.05 % ophthalmic solution   Eczema, unspecified type       Relevant Orders   Ambulatory referral to Dermatology     Mammo: Last completed on 06/26/2020.  Pap Smear: Last completed on 06/26/2020. Follow-up: Return in about 6 months (around 01/06/2021), or if symptoms worsen or fail to improve, for hypertension.   I,Gordon Zheng,acting as a Education administrator for [employer], DO.,have documented all relevant  documentation on the behalf of Ann Held, DO,as directed by  Ann Held, DO while in the presence of Ann Held, [place], DO, have reviewed all documentation for this visit. The documentation on 07/07/20 for the exam, diagnosis, procedures, and orders are all accurate and complete.

## 2020-07-07 NOTE — Assessment & Plan Note (Signed)
stable °

## 2020-07-07 NOTE — Assessment & Plan Note (Signed)
Worsening-- pt requests derm referral

## 2020-07-07 NOTE — Patient Instructions (Signed)
Preventive Care 58-58 Years Old, Female Preventive care refers to lifestyle choices and visits with your health care provider that can promote health and wellness. This includes:  A yearly physical exam. This is also called an annual wellness visit.  Regular dental and eye exams.  Immunizations.  Screening for certain conditions.  Healthy lifestyle choices, such as: ? Eating a healthy diet. ? Getting regular exercise. ? Not using drugs or products that contain nicotine and tobacco. ? Limiting alcohol use. What can I expect for my preventive care visit? Physical exam Your health care provider will check your:  Height and weight. These may be used to calculate your BMI (body mass index). BMI is a measurement that tells if you are at a healthy weight.  Heart rate and blood pressure.  Body temperature.  Skin for abnormal spots. Counseling Your health care provider may ask you questions about your:  Past medical problems.  Family's medical history.  Alcohol, tobacco, and drug use.  Emotional well-being.  Home life and relationship well-being.  Sexual activity.  Diet, exercise, and sleep habits.  Work and work Statistician.  Access to firearms.  Method of birth control.  Menstrual cycle.  Pregnancy history. What immunizations do I need? Vaccines are usually given at various ages, according to a schedule. Your health care provider will recommend vaccines for you based on your age, medical history, and lifestyle or other factors, such as travel or where you work.   What tests do I need? Blood tests  Lipid and cholesterol levels. These may be checked every 5 years, or more often if you are over 58 years old.  Hepatitis C test.  Hepatitis B test. Screening  Lung cancer screening. You may have this screening every year starting at age 58 if you have a 30-pack-year history of smoking and currently smoke or have quit within the past 15 years.  Colorectal cancer  screening. ? All adults should have this screening starting at age 58 and continuing until age 17. ? Your health care provider may recommend screening at age 58 if you are at increased risk. ? You will have tests every 1-10 years, depending on your results and the type of screening test.  Diabetes screening. ? This is done by checking your blood sugar (glucose) after you have not eaten for a while (fasting). ? You may have this done every 1-3 years.  Mammogram. ? This may be done every 1-2 years. ? Talk with your health care provider about when you should start having regular mammograms. This may depend on whether you have a family history of breast cancer.  BRCA-related cancer screening. This may be done if you have a family history of breast, ovarian, tubal, or peritoneal cancers.  Pelvic exam and Pap test. ? This may be done every 3 years starting at age 58. ? Starting at age 11, this may be done every 58 years if you have a Pap test in combination with an HPV test. Other tests  STD (sexually transmitted disease) testing, if you are at risk.  Bone density scan. This is done to screen for osteoporosis. You may have this scan if you are at high risk for osteoporosis. Talk with your health care provider about your test results, treatment options, and if necessary, the need for more tests. Follow these instructions at home: Eating and drinking  Eat a diet that includes fresh fruits and vegetables, whole grains, lean protein, and low-fat dairy products.  Take vitamin and mineral supplements  as recommended by your health care provider.  Do not drink alcohol if: ? Your health care provider tells you not to drink. ? You are pregnant, may be pregnant, or are planning to become pregnant.  If you drink alcohol: ? Limit how much you have to 0-1 drink a day. ? Be aware of how much alcohol is in your drink. In the [place] one drink equals one 12 oz bottle of beer (355 mL), one 5 oz glass of  wine (148 mL), or one 1 oz glass of hard liquor (44 mL).   Lifestyle  Take daily care of your teeth and gums. Brush your teeth every morning and night with fluoride toothpaste. Floss one time each day.  Stay active. Exercise for at least 30 minutes 5 or more days each week.  Do not use any products that contain nicotine or tobacco, such as cigarettes, e-cigarettes, and chewing tobacco. If you need help quitting, ask your health care provider.  Do not use drugs.  If you are sexually active, practice safe sex. Use a condom or other form of protection to prevent STIs (sexually transmitted infections).  If you do not wish to become pregnant, use a form of birth control. If you plan to become pregnant, see your health care provider for a prepregnancy visit.  If told by your health care provider, take low-dose aspirin daily starting at age 58.  Find healthy ways to cope with stress, such as: ? Meditation, yoga, or listening to music. ? Journaling. ? Talking to a trusted person. ? Spending time with friends and family. Safety  Always wear your seat belt while driving or riding in a vehicle.  Do not drive: ? If you have been drinking alcohol. Do not ride with someone who has been drinking. ? When you are tired or distracted. ? While texting.  Wear a helmet and other protective equipment during sports activities.  If you have firearms in your house, make sure you follow all gun safety procedures. What's next?  Visit your health care provider once a year for an annual wellness visit.  Ask your health care provider how often you should have your eyes and teeth checked.  Stay up to date on all vaccines. This information is not intended to replace advice given to you by your health care provider. Make sure you discuss any questions you have with your health care provider. Document Revised: 12/18/2019 Document Reviewed: 11/24/2017 Elsevier Patient Education  2021 Elsevier Inc.  

## 2020-07-07 NOTE — Assessment & Plan Note (Addendum)
ghm utd Check labs  Pt wants to hold off on shingrix and covid booster

## 2020-07-07 NOTE — Assessment & Plan Note (Signed)
Pt doing well with weight loss and exercises daily

## 2020-07-07 NOTE — Assessment & Plan Note (Signed)
Well controlled, no changes to meds. Encouraged heart healthy diet such as the DASH diet and exercise as tolerated.  °

## 2020-07-08 LAB — CBC WITH DIFFERENTIAL/PLATELET
Basophils Absolute: 0.1 10*3/uL (ref 0.0–0.1)
Basophils Relative: 1.2 % (ref 0.0–3.0)
Eosinophils Absolute: 0.1 10*3/uL (ref 0.0–0.7)
Eosinophils Relative: 1.5 % (ref 0.0–5.0)
HCT: 41.1 % (ref 36.0–46.0)
Hemoglobin: 13.5 g/dL (ref 12.0–15.0)
Lymphocytes Relative: 32 % (ref 12.0–46.0)
Lymphs Abs: 1.8 10*3/uL (ref 0.7–4.0)
MCHC: 32.8 g/dL (ref 30.0–36.0)
MCV: 84.9 fl (ref 78.0–100.0)
Monocytes Absolute: 0.3 10*3/uL (ref 0.1–1.0)
Monocytes Relative: 5.8 % (ref 3.0–12.0)
Neutro Abs: 3.3 10*3/uL (ref 1.4–7.7)
Neutrophils Relative %: 59.5 % (ref 43.0–77.0)
Platelets: 233 10*3/uL (ref 150.0–400.0)
RBC: 4.84 Mil/uL (ref 3.87–5.11)
RDW: 14.6 % (ref 11.5–15.5)
WBC: 5.6 10*3/uL (ref 4.0–10.5)

## 2020-07-08 LAB — COMPREHENSIVE METABOLIC PANEL
ALT: 28 U/L (ref 0–35)
AST: 23 U/L (ref 0–37)
Albumin: 4 g/dL (ref 3.5–5.2)
Alkaline Phosphatase: 175 U/L — ABNORMAL HIGH (ref 39–117)
BUN: 15 mg/dL (ref 6–23)
CO2: 27 mEq/L (ref 19–32)
Calcium: 9.8 mg/dL (ref 8.4–10.5)
Chloride: 104 mEq/L (ref 96–112)
Creatinine, Ser: 0.88 mg/dL (ref 0.40–1.20)
GFR: 73 mL/min (ref >60.00)
Glucose, Bld: 78 mg/dL (ref 70–99)
Potassium: 3.9 mEq/L (ref 3.5–5.1)
Sodium: 141 mEq/L (ref 135–145)
Total Bilirubin: 0.5 mg/dL (ref 0.2–1.2)
Total Protein: 8 g/dL (ref 6.0–8.3)

## 2020-07-08 LAB — LIPID PANEL
Cholesterol: 175 mg/dL (ref 0–200)
HDL: 60.1 mg/dL (ref >39.00)
LDL Cholesterol: 98 mg/dL (ref 0–99)
NonHDL: 115.28
Total CHOL/HDL Ratio: 3
Triglycerides: 86 mg/dL (ref 0.0–149.0)
VLDL: 17.2 mg/dL (ref 0.0–40.0)

## 2020-07-08 LAB — TSH: TSH: 1.38 u[IU]/mL (ref 0.35–4.50)

## 2020-07-14 ENCOUNTER — Other Ambulatory Visit: Payer: Self-pay | Admitting: Family Medicine

## 2020-07-14 ENCOUNTER — Encounter: Payer: Self-pay | Admitting: Family Medicine

## 2020-07-14 DIAGNOSIS — R748 Abnormal levels of other serum enzymes: Secondary | ICD-10-CM

## 2020-07-14 NOTE — Telephone Encounter (Signed)
Stop the tylenol and we can rehcheck labs first and if they come down we don't need to do the [place]

## 2020-07-15 ENCOUNTER — Other Ambulatory Visit: Payer: Self-pay | Admitting: Family Medicine

## 2020-07-15 DIAGNOSIS — R748 Abnormal levels of other serum enzymes: Secondary | ICD-10-CM

## 2020-07-15 NOTE — Telephone Encounter (Signed)
2 a day should not be a problem I will check an [place] of the abd

## 2020-07-25 ENCOUNTER — Ambulatory Visit (HOSPITAL_BASED_OUTPATIENT_CLINIC_OR_DEPARTMENT_OTHER): Payer: Managed Care, Other (non HMO)

## 2020-07-26 ENCOUNTER — Other Ambulatory Visit: Payer: Self-pay | Admitting: Neurology

## 2020-08-01 LAB — COLOGUARD: COLOGUARD: NEGATIVE

## 2020-08-01 LAB — EXTERNAL GENERIC LAB PROCEDURE: COLOGUARD: NEGATIVE

## 2020-08-04 ENCOUNTER — Ambulatory Visit (HOSPITAL_BASED_OUTPATIENT_CLINIC_OR_DEPARTMENT_OTHER): Admission: RE | Admit: 2020-08-04 | Payer: Managed Care, Other (non HMO) | Source: Ambulatory Visit

## 2020-08-13 ENCOUNTER — Other Ambulatory Visit: Payer: Self-pay

## 2020-08-13 ENCOUNTER — Ambulatory Visit (HOSPITAL_BASED_OUTPATIENT_CLINIC_OR_DEPARTMENT_OTHER)
Admission: RE | Admit: 2020-08-13 | Discharge: 2020-08-13 | Disposition: A | Discharge status: Home / Self Care | Payer: Managed Care, Other (non HMO) | Source: Ambulatory Visit | Attending: Family Medicine | Admitting: Family Medicine

## 2020-08-13 ENCOUNTER — Other Ambulatory Visit (HOSPITAL_BASED_OUTPATIENT_CLINIC_OR_DEPARTMENT_OTHER): Payer: Self-pay | Admitting: Emergency Medicine

## 2020-08-13 DIAGNOSIS — R748 Abnormal levels of other serum enzymes: Secondary | ICD-10-CM | POA: Diagnosis not present

## 2020-10-12 NOTE — Progress Notes (Signed)
NEUROLOGY FOLLOW UP OFFICE NOTE  Ruth [name] [id number]  Assessment/Plan:   History of cerebral aneurysm rupture s/p clipping Migraine without aura, without status migrainosus, not intractable  1 Check MRA of head to follow up on cerebral aneurysm 2 Follow up in 6 months (sooner if needed)  Subjective:  Pleas Ruth. 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  UPDATE: Discontinued topiramate in January. She has had a mild headache from time to time, infrequent.  No headaches which she would classify as a migraine.  She still reports a persistent right ear pressure since the surgery, sometimes pain.  ENT have found nothing wrong.  She has not followed up with [facility] neurosurgery.  They recommended follow up imaging with me.   Current NSAIDS/analgesics:  Tylenol, rarely ibuprofen Current triptans:  none Current ergotamine:  none Current anti-emetic:  none Current muscle relaxants:  none Current Antihypertensive medications:  amlodipine Current Antidepressant medications:  none Current Anticonvulsant medications:  none Current anti-CGRP:  none Current Vitamins/Herbal/Supplements:  none Current Antihistamines/Decongestants:  Flonase Other therapy:  none Hormone/birth control:  none  Caffeine:  Sweet tea daily.  No coffee or cola Diet:  Drinks water.  Usually wouldn't skip meals but she has since topiramate Exercise:  Not recently Depression: no ; Anxiety:  yes Other pain:  Localized head pain from surgery Sleep hygiene:  Sleeps okay.  Feels tired  HISTORY: She has longstanding history of headaches since young adulthood, which she says have been attributed to allergies and sinus issues. In 2016, she reported different type of headache.  They were right sided, throbbing/pressure and 8/10 intensity.  At that time, they lasted all day and occurred daily.  It is  associated with nausea, photophobia, phonophobia and osmophobia.  She also reported seeing floaters but her eye doctor said it was normal for her age.  She also feels a little dizzy.  She would Advil or Tylenol almost daily, with variable effect.  She denies family history of headache. She then has had episodes of severe dizziness, not associated with headache.  It is not spinning but it is a sense of movement and feeling off-balance.  There is mild nausea but no vomiting.  It occurs spontaneously throughout the day and each episode can last up to 4 hours non-stop.  She noted some bilateral tinnitus.  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  She subsequently established care with another neurologist, Dr. Felecia Shelling, who recommended close follow up.  She was lost to follow up with Dr. Felecia Shelling due to the COVID-19 pandemic until she saw him again in April 2021 and started her on topiramate.  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     Prior to the aneurysm rupture, migraines were infrequent,about once a month, and responded within 1 hour with Tylenol.  Since the surgery, she has had some head pain associated with the surgery but no migraines.  However, she has had unwanted side effects from topiramate, such as blurred vision and appetite suppression, causing 30 lb weight loss.  She would like to discontinue it.   Imaging personally  reviewed: 03/11/2015 MRI BRAIN W WO:  1.  No acute intracranial abnormality.  2. Mild to moderate for age nonspecific mostly subcortical cerebral white matter signal changes. Solitary small nonspecific focus of T2 hyperintensity in the cerebellum, most resembling a chronic lacune. 04/16/2015 CTA HEAD:  1.  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  2. Otherwise normal CTA circle of Willis without focal stenosis, other aneurysm, or branch vessel occlusion.  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 Mild extent of white matter foci in the subcortical white matter in the left cerebral hemisphere consistent with mild chronic microvascular ischemic changes. None of these appear to be acute and there is no change when compared to the MRI dated 03/11/2015.  07/21/2019 CT BRAIN WO: 1. There are findings concerning for potential subarachnoid hemorrhage, although this could simply reflect "pseudo subarachnoid hemorrhage" related to interval development of mild hydrocephalus compared to prior study from 2017. Given the patient's documented history of ACA aneurysm, further evaluation with repeat head CTA is recommended at this time. 07/21/2019 CTA HEAD & NECK:  1. 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  Trace subarachnoid hemorrhage including trace IVH visible now. Mild proximal ACA vasospasm. Acute ventriculomegaly, although no definite transependymal edema.  Note also dominant Left ACA A1. 2. Elsewhere stable and negative CTA head and neck. No atherosclerosis or arterial stenosis identified.    Past NSAIDS/analgesics:  none Past abortive triptans:  none Past abortive ergotamine:  none Past muscle relaxants:  none Past anti-emetic:  Zofran ODT 4mg  Past antihypertensive medications:  Metoprolol succinate, losartan, valsartain, HCTZ Past antidepressant medications:  Sertraline 25mg , Wellbutrin Past anticonvulsant medications:  topiramate 50mg  QHS Past anti-CGRP:  none Past vitamins/Herbal/Supplements:  none Past antihistamines/decongestants:  none Other past therapies:  none    Family history of headache or aneurysm:  no  PAST MEDICAL  HISTORY: Past Medical History:  Diagnosis Date   Allergic rhinitis, cause unspecified    Asthma    Eczema    Food allergy    Gallbladder problem    Headache    Hypertension     MEDICATIONS: Current Outpatient Medications on File Prior to Visit  Medication Sig Dispense Refill   albuterol (VENTOLIN HFA) 108 (90 Base) MCG/ACT inhaler Inhale 2 puffs into the lungs every 6 (six) hours as needed for wheezing or shortness of breath. 18 g 1   amLODipine (NORVASC) 5 MG tablet Take 1 tablet (5 mg total) by mouth daily. 90 tablet 1   azelastine (OPTIVAR) 0.05 % ophthalmic solution Place 1 drop into both eyes 2 (two) times daily. 6 mL 12   EPINEPHrine 0.3 mg/0.3 mL IJ SOAJ injection Inject into upper thigh for severe allergic reaction 2 each 1   fluticasone (FLONASE) 50 MCG/ACT nasal spray INSTILL 2 SPRAYS INTO EACH NOSTRIL EVERY DAY 16 g 2   fluticasone furoate-vilanterol (BREO ELLIPTA) 100-25 MCG/INH AEPB Inhale 1 puff into the lungs daily. 1 each 5   pantoprazole (PROTONIX) 40 MG tablet Take 1 tablet (40 mg total) by mouth daily. 90 tablet 3   No current facility-administered medications on file prior to visit.    ALLERGIES: Allergies  Allergen Reactions   Other Anaphylaxis    Nuts    Chlorhexidine Gluconate Itching    FAMILY HISTORY: Family History  Problem Relation Age of Onset   Asthma Mother    Allergies Mother  Asthma Father    Drug abuse Father    Allergies Father       Objective:  Blood pressure 137/83, pulse 95, height 5\' 5"  (1.651 m), weight 206 lb 4 oz (93.6 kg), last menstrual period 09/19/2015, SpO2 99 %. General: No acute distress.  Patient appears well-groomed.    Metta Clines, DO  CC: Roma Schanz, DO

## 2020-10-14 ENCOUNTER — Encounter: Payer: Self-pay | Admitting: Neurology

## 2020-10-14 ENCOUNTER — Other Ambulatory Visit: Payer: Self-pay

## 2020-10-14 ENCOUNTER — Ambulatory Visit: Payer: Managed Care, Other (non HMO) | Admitting: Neurology

## 2020-10-14 VITALS — BP 137/83 | HR 95 | Ht 65.0 in | Wt 206.2 lb

## 2020-10-14 DIAGNOSIS — G43009 Migraine without aura, not intractable, without status migrainosus: Secondary | ICD-10-CM

## 2020-10-14 DIAGNOSIS — I671 Cerebral aneurysm, nonruptured: Secondary | ICD-10-CM

## 2020-10-14 NOTE — Patient Instructions (Signed)
Will check MRA of head to follow up on aneurysm Follow up 6 months.

## 2020-10-17 ENCOUNTER — Other Ambulatory Visit: Payer: Self-pay | Admitting: Family Medicine

## 2020-10-17 DIAGNOSIS — J302 Other seasonal allergic rhinitis: Secondary | ICD-10-CM

## 2020-10-22 ENCOUNTER — Other Ambulatory Visit: Payer: Managed Care, Other (non HMO)

## 2020-10-24 ENCOUNTER — Ambulatory Visit
Admission: RE | Admit: 2020-10-24 | Discharge: 2020-10-24 | Disposition: A | Discharge status: Home / Self Care | Payer: Managed Care, Other (non HMO) | Source: Ambulatory Visit | Attending: Neurology | Admitting: Neurology

## 2020-10-24 ENCOUNTER — Other Ambulatory Visit: Payer: Self-pay

## 2020-10-24 DIAGNOSIS — I671 Cerebral aneurysm, nonruptured: Secondary | ICD-10-CM

## 2020-10-28 ENCOUNTER — Other Ambulatory Visit: Payer: Self-pay | Admitting: Neurology

## 2020-10-28 DIAGNOSIS — I671 Cerebral aneurysm, nonruptured: Secondary | ICD-10-CM

## 2020-10-28 NOTE — Progress Notes (Unsigned)
Ruth Partridge, DO  10/27/2020 12:41 PM EDT      MRA shows tiny residual aneurysm from the surgery but nothing alarming - I would like to repeat MRA of head in 6 months (should be done prior to follow upon 04/20/2021).     Order MRA of Head as instructed.

## 2020-11-25 ENCOUNTER — Encounter: Payer: Self-pay | Admitting: Family Medicine

## 2020-11-26 MED ORDER — AMLODIPINE BESYLATE 5 MG PO TABS: 5.0000 mg | ORAL_TABLET | Freq: Every day | ORAL | 0 refills | Status: DC

## 2020-12-11 ENCOUNTER — Encounter: Payer: Self-pay | Admitting: Family Medicine

## 2020-12-12 MED ORDER — FLUTICASONE FUROATE-VILANTEROL 100-25 MCG/INH IN AEPB: 1.0000 | INHALATION_SPRAY | Freq: Every day | RESPIRATORY_TRACT | 5 refills | Status: DC

## 2021-01-26 ENCOUNTER — Other Ambulatory Visit: Payer: Self-pay | Admitting: Family Medicine

## 2021-02-23 ENCOUNTER — Ambulatory Visit (INDEPENDENT_AMBULATORY_CARE_PROVIDER_SITE_OTHER): Payer: Managed Care, Other (non HMO) | Admitting: Family Medicine

## 2021-02-23 ENCOUNTER — Other Ambulatory Visit: Payer: Self-pay

## 2021-02-23 ENCOUNTER — Encounter: Payer: Self-pay | Admitting: Family Medicine

## 2021-02-23 VITALS — BP 120/86 | HR 93 | Temp 99.0°F | Resp 18 | Ht 65.0 in | Wt 211.6 lb

## 2021-02-23 DIAGNOSIS — L700 Acne vulgaris: Secondary | ICD-10-CM

## 2021-02-23 DIAGNOSIS — I10 Essential (primary) hypertension: Secondary | ICD-10-CM | POA: Diagnosis not present

## 2021-02-23 DIAGNOSIS — R1013 Epigastric pain: Secondary | ICD-10-CM | POA: Diagnosis not present

## 2021-02-23 DIAGNOSIS — R748 Abnormal levels of other serum enzymes: Secondary | ICD-10-CM | POA: Diagnosis not present

## 2021-02-23 DIAGNOSIS — J45909 Unspecified asthma, uncomplicated: Secondary | ICD-10-CM

## 2021-02-23 DIAGNOSIS — J302 Other seasonal allergic rhinitis: Secondary | ICD-10-CM | POA: Diagnosis not present

## 2021-02-23 DIAGNOSIS — J3089 Other allergic rhinitis: Secondary | ICD-10-CM | POA: Insufficient documentation

## 2021-02-23 DIAGNOSIS — H1013 Acute atopic conjunctivitis, bilateral: Secondary | ICD-10-CM | POA: Diagnosis not present

## 2021-02-23 DIAGNOSIS — J4541 Moderate persistent asthma with (acute) exacerbation: Secondary | ICD-10-CM

## 2021-02-23 LAB — HEPATIC FUNCTION PANEL
ALT: 28 U/L (ref 0–35)
AST: 24 U/L (ref 0–37)
Albumin: 4.1 g/dL (ref 3.5–5.2)
Alkaline Phosphatase: 172 U/L — ABNORMAL HIGH (ref 39–117)
Bilirubin, Direct: 0.1 mg/dL (ref 0.0–0.3)
Total Bilirubin: 0.4 mg/dL (ref 0.2–1.2)
Total Protein: 7.5 g/dL (ref 6.0–8.3)

## 2021-02-23 MED ORDER — PANTOPRAZOLE SODIUM 40 MG PO TBEC: 40.0000 mg | DELAYED_RELEASE_TABLET | Freq: Every day | ORAL | 3 refills | Status: DC

## 2021-02-23 MED ORDER — AMLODIPINE BESYLATE 5 MG PO TABS: ORAL_TABLET | ORAL | 1 refills | Status: DC

## 2021-02-23 MED ORDER — AZELASTINE HCL 0.05 % OP SOLN: 1.0000 [drp] | Freq: Two times a day (BID) | OPHTHALMIC | 12 refills | Status: DC

## 2021-02-23 MED ORDER — CLINDAMYCIN PHOSPHATE 1 % EX LOTN: TOPICAL_LOTION | Freq: Two times a day (BID) | CUTANEOUS | 0 refills | Status: DC

## 2021-02-23 MED ORDER — FLUTICASONE PROPIONATE 50 MCG/ACT NA SUSP: NASAL | 2 refills | Status: DC

## 2021-02-23 NOTE — Assessment & Plan Note (Signed)
Pt having more problems Requesting referral back to pulmonary

## 2021-02-23 NOTE — Assessment & Plan Note (Signed)
Well controlled, no changes to meds. Encouraged heart healthy diet such as the DASH diet and exercise as tolerated.  °

## 2021-02-23 NOTE — Patient Instructions (Addendum)

## 2021-02-23 NOTE — Assessment & Plan Note (Signed)
Refer to allergist

## 2021-02-23 NOTE — Assessment & Plan Note (Signed)
Cleocin lotion Consider derm if no improvement

## 2021-02-23 NOTE — Progress Notes (Signed)
Established Patient Office Visit  Subjective:  Patient ID: Ruth [name], female    DOB: 15-Jul-1960  Age: 60 y.o. MRN: [id number]  CC:  Chief Complaint  Patient presents with   Hypertension   Follow-up    HPI Ruth [name] presents for bp f/u    she also c/o pimple on corners of her mouth that are not resolving.  She used otc tx with no results.  Her allergies are also worsening and she would like a referral back to pulmonary due to worsening allergies and asthma.    Past Medical History:  Diagnosis Date   Allergic rhinitis, cause unspecified    Asthma    Eczema    Food allergy    Gallbladder problem    Headache    Hypertension     Past Surgical History:  Procedure Laterality Date   CESAREAN SECTION     CESAREAN SECTION Bilateral [id number]    Family History  Problem Relation Age of Onset   Asthma Mother    Allergies Mother    Asthma Father    Drug abuse Father    Allergies Father     Social History   Socioeconomic History   Marital status: Single    Spouse name: Not on file   Number of children: Not on file   Years of education: Not on file   Highest education level: Not on file  Occupational History   Occupation: Juvenile Counsler    Comment: guard   Tobacco Use   Smoking status: Never   Smokeless tobacco: Never  Substance and Sexual Activity   Alcohol use: Yes    Alcohol/week: 0.0 standard drinks    Comment: one glass of wine per week/fim   Drug use: No   Sexual activity: Yes    Partners: Male  Other Topics Concern   Not on file  Social History Narrative   Exercise--no   Right handed   Social Determinants of Health   Financial Resource Strain: Not on file  Food Insecurity: Not on file  Transportation Needs: Not on file  Physical Activity: Not on file  Stress: Not on file  Social Connections: Not on file  Intimate Partner Violence: Not on file    Outpatient Medications Prior to Visit  Medication Sig Dispense Refill   albuterol  (VENTOLIN HFA) 108 (90 Base) MCG/ACT inhaler Inhale 2 puffs into the lungs every 6 (six) hours as needed for wheezing or shortness of breath. 18 g 1   EPINEPHrine 0.3 mg/0.3 mL IJ SOAJ injection Inject into upper thigh for severe allergic reaction 2 each 1   fluticasone furoate-vilanterol (BREO ELLIPTA) 100-25 MCG/INH AEPB Inhale 1 puff into the lungs daily. 1 each 5   amLODipine (NORVASC) 5 MG tablet TAKE 1 TABLET(5 MG) BY MOUTH DAILY 30 tablet 0   azelastine (OPTIVAR) 0.05 % ophthalmic solution Place 1 drop into both eyes 2 (two) times daily. 6 mL 12   fluticasone (FLONASE) 50 MCG/ACT nasal spray SHAKE LIQUID AND USE 2 SPRAYS IN EACH NOSTRIL EVERY DAY 16 g 2   pantoprazole (PROTONIX) 40 MG tablet Take 1 tablet (40 mg total) by mouth daily. 90 tablet 3   No facility-administered medications prior to visit.    Allergies  Allergen Reactions   Other Anaphylaxis    Nuts    Chlorhexidine Gluconate Itching    ROS Review of Systems  Constitutional:  Negative for appetite change, diaphoresis, fatigue and unexpected weight change.  Eyes:  Negative for pain,  redness and visual disturbance.  Respiratory:  Negative for cough, chest tightness, shortness of breath and wheezing.   Cardiovascular:  Negative for chest pain, palpitations and leg swelling.  Endocrine: Negative for cold intolerance, heat intolerance, polydipsia, polyphagia and polyuria.  Genitourinary:  Negative for difficulty urinating, dysuria and frequency.  Neurological:  Negative for dizziness, light-headedness, numbness and headaches.     Objective:    Physical Exam Vitals and nursing note reviewed.  Constitutional:      Appearance: She is well-developed.  HENT:     Head: Normocephalic and atraumatic.  Eyes:     Conjunctiva/sclera: Conjunctivae normal.  Neck:     Thyroid: No thyromegaly.     Vascular: No carotid bruit or JVD.  Cardiovascular:     Rate and Rhythm: Normal rate and regular rhythm.     Heart sounds: Normal  heart sounds. No murmur heard. Pulmonary:     Effort: Pulmonary effort is normal. No respiratory distress.     Breath sounds: Normal breath sounds. No wheezing or rales.  Chest:     Chest wall: No tenderness.  Musculoskeletal:     Cervical back: Normal range of motion and neck supple.  Neurological:     Mental Status: She is alert and oriented to person, place, and time.  Psychiatric:        Mood and Affect: Mood normal.        Behavior: Behavior normal.        Thought Content: Thought content normal.        Judgment: Judgment normal.    BP 120/86 (BP Location: Left Arm, Patient Position: Sitting, Cuff Size: Large)   Pulse 93   Temp 99 F (37.2 C) (Oral)   Resp 18   Ht 5\' 5"  (1.651 m)   Wt 211 lb 9.6 oz (96 kg)   LMP 09/19/2015   SpO2 99%   BMI 35.21 kg/m  Wt Readings from Last 3 Encounters:  02/23/21 211 lb 9.6 oz (96 kg)  10/14/20 206 lb 4 oz (93.6 kg)  07/07/20 193 lb 6.4 oz (87.7 kg)     Health Maintenance Due  Topic Date Due   Pneumococcal Vaccine 65-89 Years old (1 - PCV) Never done   Zoster Vaccines- Shingrix (1 of 2) Never done   COVID-19 Vaccine (3 - Booster for Pfizer series) 01/04/2020   Fecal DNA (Cologuard)  04/04/2020    There are no preventive care reminders to display for this patient.  Lab Results  Component Value Date   TSH 1.38 07/07/2020   Lab Results  Component Value Date   WBC 5.6 07/07/2020   HGB 13.5 07/07/2020   HCT 41.1 07/07/2020   MCV 84.9 07/07/2020   PLT 233.0 07/07/2020   Lab Results  Component Value Date   NA 141 07/07/2020   K 3.9 07/07/2020   CO2 27 07/07/2020   GLUCOSE 78 07/07/2020   BUN 15 07/07/2020   CREATININE 0.88 07/07/2020   BILITOT 0.4 02/23/2021   ALKPHOS 172 (H) 02/23/2021   AST 24 02/23/2021   ALT 28 02/23/2021   PROT 7.5 02/23/2021   ALBUMIN 4.1 02/23/2021   CALCIUM 9.8 07/07/2020   ANIONGAP 11 07/21/2019   GFR 73.00 07/07/2020   Lab Results  Component Value Date   CHOL 175 07/07/2020   Lab  Results  Component Value Date   HDL 60.10 07/07/2020   Lab Results  Component Value Date   LDLCALC 98 07/07/2020   Lab Results  Component Value Date  TRIG 86.0 07/07/2020   Lab Results  Component Value Date   CHOLHDL 3 07/07/2020   Lab Results  Component Value Date   HGBA1C 5.6 03/05/2019      Assessment & Plan:   Problem List Items Addressed This Visit       Unprioritized   HTN (hypertension) - Primary   Relevant Medications   amLODipine (NORVASC) 5 MG tablet   Dyspepsia   Relevant Medications   pantoprazole (PROTONIX) 40 MG tablet   Acne vulgaris    Cleocin lotion Consider derm if no improvement       Relevant Medications   clindamycin (CLEOCIN-T) 1 % lotion   Environmental and seasonal allergies    Refer to allergist       Relevant Orders   Ambulatory referral to Pulmonology   Essential hypertension    Well controlled, no changes to meds. Encouraged heart healthy diet such as the DASH diet and exercise as tolerated.       Relevant Medications   amLODipine (NORVASC) 5 MG tablet   Moderate persistent asthma with acute exacerbation    Pt having more problems Requesting referral back to pulmonary      Other Visit Diagnoses     Allergic conjunctivitis of both eyes       Relevant Medications   azelastine (OPTIVAR) 0.05 % ophthalmic solution   Seasonal allergies       Relevant Medications   fluticasone (FLONASE) 50 MCG/ACT nasal spray   Moderate asthma, unspecified whether complicated, unspecified whether persistent       Relevant Orders   Ambulatory referral to Pulmonology   Elevated alkaline phosphatase level           Meds ordered this encounter  Medications   amLODipine (NORVASC) 5 MG tablet    Sig: TAKE 1 TABLET(5 MG) BY MOUTH DAILY    Dispense:  90 tablet    Refill:  1   pantoprazole (PROTONIX) 40 MG tablet    Sig: Take 1 tablet (40 mg total) by mouth daily.    Dispense:  90 tablet    Refill:  3   azelastine (OPTIVAR) 0.05 %  ophthalmic solution    Sig: Place 1 drop into both eyes 2 (two) times daily.    Dispense:  6 mL    Refill:  12   fluticasone (FLONASE) 50 MCG/ACT nasal spray    Sig: SHAKE LIQUID AND USE 2 SPRAYS IN EACH NOSTRIL EVERY DAY    Dispense:  16 g    Refill:  2   clindamycin (CLEOCIN-T) 1 % lotion    Sig: Apply topically 2 (two) times daily.    Dispense:  60 mL    Refill:  0    Follow-up: Return in about 6 months (around 08/23/2021), or if symptoms worsen or fail to improve, for annual exam, fasting.    Ann Held, DO

## 2021-02-25 LAB — ALKALINE PHOSPHATASE, ISOENZYMES
Alkaline Phosphatase: 200 IU/L — ABNORMAL HIGH (ref 44–121)
BONE FRACTION: 24 % (ref 14–68)
INTESTINAL FRAC.: 19 % — ABNORMAL HIGH (ref 0–18)
LIVER FRACTION: 57 % (ref 18–85)

## 2021-02-25 LAB — SPECIMEN STATUS REPORT

## 2021-03-06 ENCOUNTER — Other Ambulatory Visit: Payer: Self-pay

## 2021-03-06 DIAGNOSIS — R748 Abnormal levels of other serum enzymes: Secondary | ICD-10-CM

## 2021-03-12 ENCOUNTER — Other Ambulatory Visit: Payer: Managed Care, Other (non HMO)

## 2021-03-13 ENCOUNTER — Other Ambulatory Visit: Payer: Managed Care, Other (non HMO)

## 2021-03-16 ENCOUNTER — Other Ambulatory Visit: Payer: Managed Care, Other (non HMO)

## 2021-03-18 ENCOUNTER — Other Ambulatory Visit: Payer: Managed Care, Other (non HMO)

## 2021-03-27 ENCOUNTER — Telehealth: Payer: Self-pay | Admitting: Family Medicine

## 2021-03-27 NOTE — Telephone Encounter (Signed)
Pt called and stated that she randomly fainted. Transferred to triage for further advice.

## 2021-03-27 NOTE — Telephone Encounter (Signed)
Called pt to go over symptoms, she is shceduled 1/3 for fainting, she did say she felt a bit dizy and was hard to balance. When she got transferred to triage they told hrr they did not have a licensed nurse that could speak with her in the current stae she is in and did not want to be traiged again. Please advise.

## 2021-03-27 NOTE — Telephone Encounter (Signed)
Noted. Pt called. LVM to see how patient was feeling and recommended UC

## 2021-03-27 NOTE — Telephone Encounter (Signed)
Sorry previous message got exited out before I got to spell check and route* Called pt to go over symptoms, she is scheduled 1/3 for fainting, she did say she felt a bit dizzy and was hard to balance. When she got transferred to triage they told her they did not have a licensed nurse that could speak with her in the current stae she is in and did not want to be traiged again. Please advise.

## 2021-03-27 NOTE — Telephone Encounter (Signed)
Spoke with patient. Pt states she pass out yesterday on the train. Pt believes she passed out because it was hot on the train and she had not eaten anything. Pt states she passed out for about a minute. I asked pt if she has checked her blood pressure recently and she had not. She also stated that she was dizzy again today but she has not eaten anything today yet. Pt states she will go to a pharmacy to have her BP checked. I advised her to have someone else drive and if her blood pressure is high to go to a UC or ED. Pt wanted to know what to do if her blood pressure is normal and I advised to eat something and stay hydrated and to not be alone and if another syncope events occurs to seek emergency care. Pt does have a visit with Mickel Baas on 01/03 when she is back in town.

## 2021-03-31 ENCOUNTER — Other Ambulatory Visit: Payer: Self-pay

## 2021-03-31 ENCOUNTER — Inpatient Hospital Stay: Admission: RE | Admit: 2021-03-31 | Payer: Managed Care, Other (non HMO) | Source: Ambulatory Visit

## 2021-03-31 ENCOUNTER — Ambulatory Visit: Payer: Managed Care, Other (non HMO) | Admitting: Family Medicine

## 2021-03-31 ENCOUNTER — Encounter: Payer: Self-pay | Admitting: Family Medicine

## 2021-03-31 ENCOUNTER — Ambulatory Visit
Admission: RE | Admit: 2021-03-31 | Discharge: 2021-03-31 | Disposition: A | Discharge status: Home / Self Care | Payer: Managed Care, Other (non HMO) | Source: Ambulatory Visit | Attending: Neurology | Admitting: Neurology

## 2021-03-31 ENCOUNTER — Other Ambulatory Visit: Payer: Self-pay | Admitting: Neurology

## 2021-03-31 VITALS — BP 138/90 | HR 89 | Temp 97.5°F | Resp 18 | Ht 65.0 in | Wt 213.0 lb

## 2021-03-31 DIAGNOSIS — I671 Cerebral aneurysm, nonruptured: Secondary | ICD-10-CM

## 2021-03-31 DIAGNOSIS — R42 Dizziness and giddiness: Secondary | ICD-10-CM

## 2021-03-31 DIAGNOSIS — R55 Syncope and collapse: Secondary | ICD-10-CM

## 2021-03-31 DIAGNOSIS — I10 Essential (primary) hypertension: Secondary | ICD-10-CM | POA: Diagnosis not present

## 2021-03-31 LAB — CBC WITH DIFFERENTIAL/PLATELET
Basophils Absolute: 0 10*3/uL (ref 0.0–0.1)
Basophils Relative: 1.2 % (ref 0.0–3.0)
Eosinophils Absolute: 0.2 10*3/uL (ref 0.0–0.7)
Eosinophils Relative: 3.7 % (ref 0.0–5.0)
HCT: 41.6 % (ref 36.0–46.0)
Hemoglobin: 13.7 g/dL (ref 12.0–15.0)
Lymphocytes Relative: 30.9 % (ref 12.0–46.0)
Lymphs Abs: 1.3 10*3/uL (ref 0.7–4.0)
MCHC: 32.9 g/dL (ref 30.0–36.0)
MCV: 85.4 fl (ref 78.0–100.0)
Monocytes Absolute: 0.3 10*3/uL (ref 0.1–1.0)
Monocytes Relative: 7.6 % (ref 3.0–12.0)
Neutro Abs: 2.3 10*3/uL (ref 1.4–7.7)
Neutrophils Relative %: 56.6 % (ref 43.0–77.0)
Platelets: 224 10*3/uL (ref 150.0–400.0)
RBC: 4.87 Mil/uL (ref 3.87–5.11)
RDW: 14.4 % (ref 11.5–15.5)
WBC: 4.1 10*3/uL (ref 4.0–10.5)

## 2021-03-31 LAB — COMPREHENSIVE METABOLIC PANEL
ALT: 24 U/L (ref 0–35)
AST: 19 U/L (ref 0–37)
Albumin: 4.1 g/dL (ref 3.5–5.2)
Alkaline Phosphatase: 177 U/L — ABNORMAL HIGH (ref 39–117)
BUN: 14 mg/dL (ref 6–23)
CO2: 28 mEq/L (ref 19–32)
Calcium: 9.1 mg/dL (ref 8.4–10.5)
Chloride: 104 mEq/L (ref 96–112)
Creatinine, Ser: 0.84 mg/dL (ref 0.40–1.20)
GFR: 76.8 mL/min (ref >60.00)
Glucose, Bld: 88 mg/dL (ref 70–99)
Potassium: 3.9 mEq/L (ref 3.5–5.1)
Sodium: 139 mEq/L (ref 135–145)
Total Bilirubin: 0.4 mg/dL (ref 0.2–1.2)
Total Protein: 7.6 g/dL (ref 6.0–8.3)

## 2021-03-31 LAB — LIPID PANEL
Cholesterol: 184 mg/dL (ref 0–200)
HDL: 61.2 mg/dL (ref >39.00)
LDL Cholesterol: 106 mg/dL — ABNORMAL HIGH (ref 0–99)
NonHDL: 122.87
Total CHOL/HDL Ratio: 3
Triglycerides: 83 mg/dL (ref 0.0–149.0)
VLDL: 16.6 mg/dL (ref 0.0–40.0)

## 2021-03-31 LAB — VITAMIN D 25 HYDROXY (VIT D DEFICIENCY, FRACTURES): VITD: 29.88 ng/mL — ABNORMAL LOW (ref 30.00–100.00)

## 2021-03-31 LAB — TSH: TSH: 1.19 u[IU]/mL (ref 0.35–5.50)

## 2021-03-31 LAB — VITAMIN B12: Vitamin B-12: 469 pg/mL (ref 211–911)

## 2021-03-31 MED ORDER — LOSARTAN POTASSIUM 50 MG PO TABS: 50.0000 mg | ORAL_TABLET | Freq: Every day | ORAL | 2 refills | Status: DC

## 2021-03-31 NOTE — Assessment & Plan Note (Signed)
Poorly controlled will alter medications, encouraged DASH diet, minimize caffeine and obtain adequate sleep. Report concerning symptoms and follow up as directed and as needed 

## 2021-03-31 NOTE — Assessment & Plan Note (Signed)
No more episodes She has f/u with neuro  Check US carotids and echo Pt has MR scheduled per neuro  If occurs again ---  Go to ER / UC

## 2021-03-31 NOTE — Progress Notes (Signed)
Established Patient Office Visit  Subjective:  Patient ID: Ruth [name], female    DOB: 04-06-1960  Age: 61 y.o. MRN: [id number]  CC:  Chief Complaint  Patient presents with   Near Syncope    Pt states passing out on the train while out of down. Pt states checking BP the day after, 148/92. Pt states no more syncope episodes. Pt states no more dizzy spells but states having dizzy spells when she first wakes up. Pt reports no chest pain or palps.    HPI Ruth [name] presents for f/u from syncopal episode while on a train to [facility] from [place].  It was a 40 min ride and it was a packed train and she had to stand the whole trip and it was hot .  She had not eaten that morning and she passed out for 1 min.  Her son was with her and many passengers witnessed it.  She was given some OJ/ water and then she felt fine.  No chest pain or palpitations.   + some dizziness ----  she still has some dizziness in am but no more syncopal episodes      Past Medical History:  Diagnosis Date   Allergic rhinitis, cause unspecified    Asthma    Eczema    Food allergy    Gallbladder problem    Headache    Hypertension     Past Surgical History:  Procedure Laterality Date   CESAREAN SECTION     CESAREAN SECTION Bilateral [id number]    Family History  Problem Relation Age of Onset   Asthma Mother    Allergies Mother    Asthma Father    Drug abuse Father    Allergies Father     Social History   Socioeconomic History   Marital status: Single    Spouse name: Not on file   Number of children: Not on file   Years of education: Not on file   Highest education level: Not on file  Occupational History   Occupation: Juvenile Counsler    Comment: guard   Tobacco Use   Smoking status: Never   Smokeless tobacco: Never  Substance and Sexual Activity   Alcohol use: Yes    Alcohol/week: 0.0 standard drinks    Comment: one glass of wine per week/fim   Drug use: No   Sexual activity: Yes     Partners: Male  Other Topics Concern   Not on file  Social History Narrative   Exercise--no   Right handed   Social Determinants of Health   Financial Resource Strain: Not on file  Food Insecurity: Not on file  Transportation Needs: Not on file  Physical Activity: Not on file  Stress: Not on file  Social Connections: Not on file  Intimate Partner Violence: Not on file    Outpatient Medications Prior to Visit  Medication Sig Dispense Refill   albuterol (VENTOLIN HFA) 108 (90 Base) MCG/ACT inhaler Inhale 2 puffs into the lungs every 6 (six) hours as needed for wheezing or shortness of breath. 18 g 1   amLODipine (NORVASC) 5 MG tablet TAKE 1 TABLET(5 MG) BY MOUTH DAILY 90 tablet 1   azelastine (OPTIVAR) 0.05 % ophthalmic solution Place 1 drop into both eyes 2 (two) times daily. 6 mL 12   clindamycin (CLEOCIN-T) 1 % lotion Apply topically 2 (two) times daily. 60 mL 0   EPINEPHrine 0.3 mg/0.3 mL IJ SOAJ injection Inject into upper thigh for severe  allergic reaction 2 each 1   fluticasone (FLONASE) 50 MCG/ACT nasal spray SHAKE LIQUID AND USE 2 SPRAYS IN EACH NOSTRIL EVERY DAY 16 g 2   fluticasone furoate-vilanterol (BREO ELLIPTA) 100-25 MCG/INH AEPB Inhale 1 puff into the lungs daily. 1 each 5   pantoprazole (PROTONIX) 40 MG tablet Take 1 tablet (40 mg total) by mouth daily. 90 tablet 3   No facility-administered medications prior to visit.    Allergies  Allergen Reactions   Other Anaphylaxis    Nuts    Chlorhexidine Gluconate Itching    ROS Review of Systems  Constitutional:  Negative for fever.  HENT:  Negative for congestion.   Respiratory:  Negative for cough and shortness of breath.   Cardiovascular:  Negative for chest pain, palpitations and leg swelling.  Gastrointestinal:  Negative for abdominal pain, blood in stool, nausea and vomiting.  Genitourinary:  Negative for dysuria and frequency.  Musculoskeletal:  Negative for back pain.  Skin:  Negative for rash.   Allergic/Immunologic: Negative for environmental allergies.  Neurological:  Positive for dizziness and syncope. Negative for seizures, facial asymmetry, speech difficulty, weakness, numbness and headaches.  Psychiatric/Behavioral:  Negative for behavioral problems, confusion, decreased concentration, self-injury and suicidal ideas. The patient is not nervous/anxious.      Objective:    Physical Exam Vitals and nursing note reviewed.  Constitutional:      Appearance: She is well-developed.  HENT:     Head: Normocephalic and atraumatic.  Eyes:     Conjunctiva/sclera: Conjunctivae normal.  Neck:     Thyroid: No thyromegaly.     Vascular: No carotid bruit or JVD.  Cardiovascular:     Rate and Rhythm: Normal rate and regular rhythm.     Heart sounds: Normal heart sounds. No murmur heard. Pulmonary:     Effort: Pulmonary effort is normal. No respiratory distress.     Breath sounds: Normal breath sounds. No wheezing or rales.  Chest:     Chest wall: No tenderness.  Musculoskeletal:     Cervical back: Normal range of motion and neck supple.  Neurological:     General: No focal deficit present.     Mental Status: She is alert and oriented to person, place, and time.   BP 138/90 (BP Location: Right Arm, Patient Position: Sitting, Cuff Size: Large)    Pulse 89    Temp (!) 97.5 F (36.4 C) (Oral)    Resp 18    Ht 5\' 5"  (1.651 m)    Wt 213 lb (96.6 kg)    LMP 09/19/2015    SpO2 99%    BMI 35.45 kg/m  Wt Readings from Last 3 Encounters:  03/31/21 213 lb (96.6 kg)  02/23/21 211 lb 9.6 oz (96 kg)  10/14/20 206 lb 4 oz (93.6 kg)     Health Maintenance Due  Topic Date Due   Pneumococcal Vaccine 42-56 Years old (1 - PCV) Never done   Zoster Vaccines- Shingrix (1 of 2) Never done   COVID-19 Vaccine (3 - Booster for Pfizer series) 01/04/2020   Fecal DNA (Cologuard)  04/04/2020    There are no preventive care reminders to display for this patient.  Lab Results  Component Value Date    TSH 1.38 07/07/2020   Lab Results  Component Value Date   WBC 5.6 07/07/2020   HGB 13.5 07/07/2020   HCT 41.1 07/07/2020   MCV 84.9 07/07/2020   PLT 233.0 07/07/2020   Lab Results  Component Value Date  NA 141 07/07/2020   K 3.9 07/07/2020   CO2 27 07/07/2020   GLUCOSE 78 07/07/2020   BUN 15 07/07/2020   CREATININE 0.88 07/07/2020   BILITOT 0.4 02/23/2021   ALKPHOS 172 (H) 02/23/2021   AST 24 02/23/2021   ALT 28 02/23/2021   PROT 7.5 02/23/2021   ALBUMIN 4.1 02/23/2021   CALCIUM 9.8 07/07/2020   ANIONGAP 11 07/21/2019   GFR 73.00 07/07/2020   Lab Results  Component Value Date   CHOL 175 07/07/2020   Lab Results  Component Value Date   HDL 60.10 07/07/2020   Lab Results  Component Value Date   LDLCALC 98 07/07/2020   Lab Results  Component Value Date   TRIG 86.0 07/07/2020   Lab Results  Component Value Date   CHOLHDL 3 07/07/2020   Lab Results  Component Value Date   HGBA1C 5.6 03/05/2019   Ekg---  sinus ,  no change from previous 06/2017   Assessment & Plan:   Problem List Items Addressed This Visit       Unprioritized   HTN (hypertension)   Relevant Medications   losartan (COZAAR) 50 MG tablet   Other Relevant Orders   TSH   Vitamin B12   VITAMIN D 25 Hydroxy (Vit-D Deficiency, Fractures)   CBC with Differential/Platelet   Comprehensive metabolic panel   Lipid panel   Dizziness   Relevant Orders   TSH   Vitamin B12   VITAMIN D 25 Hydroxy (Vit-D Deficiency, Fractures)   CBC with Differential/Platelet   Comprehensive metabolic panel   Lipid panel   Essential hypertension    Poorly controlled will alter medications, encouraged DASH diet, minimize caffeine and obtain adequate sleep. Report concerning symptoms and follow up as directed and as needed      Relevant Medications   losartan (COZAAR) 50 MG tablet   Syncope - Primary    No more episodes She has f/u with neuro  Check US carotids and echo Pt has MR scheduled per neuro   If occurs again ---  Go to ER / UC      Relevant Orders   EKG 12-Lead (Completed)   ECHOCARDIOGRAM COMPLETE   US Carotid Duplex Bilateral   TSH   Vitamin B12   VITAMIN D 25 Hydroxy (Vit-D Deficiency, Fractures)   CBC with Differential/Platelet   Comprehensive metabolic panel   Lipid panel    Meds ordered this encounter  Medications   losartan (COZAAR) 50 MG tablet    Sig: Take 1 tablet (50 mg total) by mouth daily.    Dispense:  30 tablet    Refill:  2    Follow-up: Return in about 2 weeks (around 04/14/2021) for hypertension.    Ann Held, DO

## 2021-03-31 NOTE — Patient Instructions (Signed)
Syncope, Adult Syncope refers to a condition in which a person temporarily loses consciousness. Syncope may also be called fainting or passing out. It is caused by a sudden decrease in blood flow to the brain. This can happen for a variety of reasons. Most causes of syncope are not dangerous. It can be triggered by things such as needle sticks, seeing blood, pain, or intense emotion. However, syncope can also be a sign of a serious medical problem, such as a heart abnormality. Other causes can include dehydration, migraines, or taking medicines that lower blood pressure. Your health care provider may do tests to find the reason why you are having syncope. If you faint, get medical help right away. Call your local emergency services (911 in the [place] Follow these instructions at home: Pay attention to any changes in your symptoms. Take these actions to stay safe and to help relieve your symptoms: Knowing when you may be about to faint Signs that you may be about to faint include: Feeling dizzy, weak, light-headed, or like the room is spinning. Feeling nauseous. Seeing spots or seeing all white or all black in your field of vision. Having cold, clammy skin or feeling warm and sweaty. Hearing ringing in the ears (tinnitus). If you start to feel like you might faint, sit or lie down right away. If sitting, put your head down between your legs. If lying down, raise (elevate) your feet above the level of your heart. Breathe deeply and steadily. Wait until all the symptoms have passed. Have someone stay with you until you feel stable. Medicines Take over-the-counter and prescription medicines only as told by your health care provider. If you are taking blood pressure or heart medicine, get up slowly and take several minutes to sit and then stand. This can reduce dizziness and decrease the risk of syncope. Lifestyle Do not drive, use machinery, or play sports until your health care provider says it is  okay. Do not drink alcohol. Do not use any products that contain nicotine or tobacco. These products include cigarettes, chewing tobacco, and vaping devices, such as e-cigarettes. If you need help quitting, ask your health care provider. Avoid hot tubs and saunas. General instructions Talk with your health care provider about your symptoms. You may need to have testing to understand the cause of your syncope. Drink enough fluid to keep your urine pale yellow. Avoid prolonged standing. If you must stand for a long time, do movements such as: Moving your legs. Crossing your legs. Flexing and stretching your leg muscles. Squatting. Keep all follow-up visits. This is important. Contact a health care provider if: You have episodes of near fainting. Get help right away if: You faint. You hit your head or are injured after fainting. You have any of these symptoms that may indicate trouble with your heart: Fast or irregular heartbeats (palpitations). Unusual pain in your chest, abdomen, or back. Shortness of breath. You have a seizure. You have a severe headache. You are confused. You have vision problems. You have severe weakness or trouble walking. You are bleeding from your mouth or rectum, or you have black or tarry stool. These symptoms may represent a serious problem that is an emergency. Do not wait to see if your symptoms will go away. Get medical help right away. Call your local emergency services (911 in the [place] Do not drive yourself to the hospital. Summary Syncope refers to a condition in which a person temporarily loses consciousness. Syncope may also be called fainting  or passing out. It is caused by a sudden decrease in blood flow to the brain. Signs that you may be about to faint include dizziness, feeling light-headed, feeling nauseous, sudden vision changes, or cold, clammy skin. Even though most causes of syncope are not dangerous, syncope can be a sign of a serious  medical problem. Get help right away if you faint. If you start to feel like you might faint, sit or lie down right away. If sitting, put your head down between your legs. If lying down, raise (elevate) your feet above the level of your heart. This information is not intended to replace advice given to you by your health care provider. Make sure you discuss any questions you have with your health care provider. Document Revised: 07/24/2020 Document Reviewed: 07/24/2020 Elsevier Patient Education  2022 [employer].

## 2021-04-02 NOTE — Progress Notes (Signed)
Tried calling pt, No answer. VM full.

## 2021-04-03 NOTE — Progress Notes (Signed)
2 nd attempt call pt no answer. LMOVM to call the office back.

## 2021-04-08 NOTE — Progress Notes (Signed)
Mychart message sent to give the office a call back.

## 2021-04-11 ENCOUNTER — Telehealth (HOSPITAL_BASED_OUTPATIENT_CLINIC_OR_DEPARTMENT_OTHER): Payer: Self-pay

## 2021-04-13 ENCOUNTER — Ambulatory Visit: Payer: Managed Care, Other (non HMO) | Admitting: Family Medicine

## 2021-04-13 ENCOUNTER — Encounter: Payer: Self-pay | Admitting: Family Medicine

## 2021-04-13 VITALS — BP 128/85 | HR 105 | Temp 100.4°F | Ht 65.0 in | Wt 208.2 lb

## 2021-04-13 DIAGNOSIS — R509 Fever, unspecified: Secondary | ICD-10-CM | POA: Diagnosis not present

## 2021-04-13 DIAGNOSIS — J101 Influenza due to other identified influenza virus with other respiratory manifestations: Secondary | ICD-10-CM

## 2021-04-13 LAB — POCT INFLUENZA A/B
Influenza A, POC: POSITIVE — AB
Influenza B, POC: NEGATIVE

## 2021-04-13 MED ORDER — OSELTAMIVIR PHOSPHATE 75 MG PO CAPS
75.0000 mg | ORAL_CAPSULE | Freq: Two times a day (BID) | ORAL | 0 refills | Status: AC
Start: 2021-04-13 — End: 2021-04-18

## 2021-04-13 MED ORDER — GUAIFENESIN ER 600 MG PO TB12: 1200.0000 mg | ORAL_TABLET | Freq: Two times a day (BID) | ORAL | 2 refills | Status: DC

## 2021-04-13 NOTE — Patient Instructions (Signed)
Flu A positive.  Tamiflu, Mucinex, Flonase, Claritin or Zyrtec, Tylenol, Ibuprofen Continue supportive measures including rest, hydration, humidifier use, steam showers, warm compresses to sinuses, warm liquids with lemon and honey, and over-the-counter cough, cold, and analgesics as needed.    Please contact office for  follow-up if symptoms do not improve or worsen. Seek emergency care if symptoms become severe.

## 2021-04-13 NOTE — Progress Notes (Signed)
Acute Office Visit  Subjective:    Patient ID: Ruth [name], female    DOB: 10/30/1960, 61 y.o.   MRN: [id number]  Chief Complaint  Patient presents with   Chills        Cough   Sore Throat    With ears feeling hot.      head congestion     Started sat night    Fever     Patient is in today for URI symptoms.   Ruth [name] reports she started feeling poorly Saturday evening, waking up with post-nasal drip, sneezing and coughing. Symptoms have progressed. She is now experiencing sore throat, headache, frontal/maxillary sinus pressure, chills (hasn't checked temperature), body aches, fatigue, malaise, ears "burning" and maybe achy yesterday, but otherwise no ear pain. She denies any chest pain, dyspnea, wheezing, GI/CU symptoms, lethargy. Reports she has poor appetite, but is able to stay well hydrated. Reports multiple coworkers are sick, but they have tested negative for flu and COVID. She had a negative COVID test this morning. So far she has tried using a humidifier, flonase, tylenol, Claritin.     Past Medical History:  Diagnosis Date   Allergic rhinitis, cause unspecified    Asthma    Eczema    Food allergy    Gallbladder problem    Headache    Hypertension     Past Surgical History:  Procedure Laterality Date   CESAREAN SECTION     CESAREAN SECTION Bilateral [id number]    Family History  Problem Relation Age of Onset   Asthma Mother    Allergies Mother    Asthma Father    Drug abuse Father    Allergies Father     Social History   Socioeconomic History   Marital status: Single    Spouse name: Not on file   Number of children: Not on file   Years of education: Not on file   Highest education level: Not on file  Occupational History   Occupation: Juvenile Counsler    Comment: guard   Tobacco Use   Smoking status: Never   Smokeless tobacco: Never  Substance and Sexual Activity   Alcohol use: Yes    Alcohol/week: 0.0 standard drinks    Comment: one  glass of wine per week/fim   Drug use: No   Sexual activity: Yes    Partners: Male  Other Topics Concern   Not on file  Social History Narrative   Exercise--no   Right handed   Social Determinants of Health   Financial Resource Strain: Not on file  Food Insecurity: Not on file  Transportation Needs: Not on file  Physical Activity: Not on file  Stress: Not on file  Social Connections: Not on file  Intimate Partner Violence: Not on file    Outpatient Medications Prior to Visit  Medication Sig Dispense Refill   albuterol (VENTOLIN HFA) 108 (90 Base) MCG/ACT inhaler Inhale 2 puffs into the lungs every 6 (six) hours as needed for wheezing or shortness of breath. 18 g 1   amLODipine (NORVASC) 5 MG tablet TAKE 1 TABLET(5 MG) BY MOUTH DAILY 90 tablet 1   clindamycin (CLEOCIN-T) 1 % lotion Apply topically 2 (two) times daily. 60 mL 0   EPINEPHrine 0.3 mg/0.3 mL IJ SOAJ injection Inject into upper thigh for severe allergic reaction 2 each 1   fluticasone (FLONASE) 50 MCG/ACT nasal spray SHAKE LIQUID AND USE 2 SPRAYS IN EACH NOSTRIL EVERY DAY 16 g 2   fluticasone furoate-vilanterol (  BREO ELLIPTA) 100-25 MCG/INH AEPB Inhale 1 puff into the lungs daily. 1 each 5   pantoprazole (PROTONIX) 40 MG tablet Take 1 tablet (40 mg total) by mouth daily. 90 tablet 3   azelastine (OPTIVAR) 0.05 % ophthalmic solution Place 1 drop into both eyes 2 (two) times daily. (Patient not taking: Reported on 04/13/2021) 6 mL 12   losartan (COZAAR) 50 MG tablet Take 1 tablet (50 mg total) by mouth daily. (Patient not taking: Reported on 04/13/2021) 30 tablet 2   No facility-administered medications prior to visit.    Allergies  Allergen Reactions   Other Anaphylaxis    Nuts    Chlorhexidine Gluconate Itching    Review of Systems All review of systems negative except what is listed in the HPI     Objective:    Physical Exam Vitals reviewed.  Constitutional:      Appearance: She is well-developed.  HENT:      Head: Normocephalic and atraumatic.     Right Ear: Tympanic membrane normal.     Left Ear: Tympanic membrane normal.     Nose: Congestion present.     Mouth/Throat:     Mouth: Mucous membranes are moist.     Pharynx: Oropharynx is clear.  Eyes:     Extraocular Movements: Extraocular movements intact.     Conjunctiva/sclera: Conjunctivae normal.  Cardiovascular:     Rate and Rhythm: Normal rate and regular rhythm.     Pulses: Normal pulses.  Pulmonary:     Effort: Pulmonary effort is normal.     Breath sounds: Normal breath sounds. No wheezing, rhonchi or rales.  Abdominal:     Palpations: Abdomen is soft.  Musculoskeletal:     Cervical back: Normal range of motion and neck supple. No tenderness.  Lymphadenopathy:     Cervical: No cervical adenopathy.  Skin:    General: Skin is warm and dry.     Capillary Refill: Capillary refill takes less than 2 seconds.     Findings: No rash.  Neurological:     General: No focal deficit present.     Mental Status: She is alert and oriented to person, place, and time. Mental status is at baseline.  Psychiatric:        Mood and Affect: Mood normal.        Behavior: Behavior normal.        Thought Content: Thought content normal.        Judgment: Judgment normal.       BP 128/85    Pulse (!) 105    Temp (!) 100.4 F (38 C) (Oral)    Ht 5\' 5"  (1.651 m)    Wt 208 lb 3.2 oz (94.4 kg)    LMP 09/19/2015    SpO2 97%    BMI 34.65 kg/m  Wt Readings from Last 3 Encounters:  04/13/21 208 lb 3.2 oz (94.4 kg)  03/31/21 213 lb (96.6 kg)  02/23/21 211 lb 9.6 oz (96 kg)    Health Maintenance Due  Topic Date Due   Pneumococcal Vaccine 88-30 Years old (1 - PCV) Never done   Zoster Vaccines- Shingrix (1 of 2) Never done   COVID-19 Vaccine (3 - Booster for Pfizer series) 01/04/2020   Fecal DNA (Cologuard)  04/04/2020    There are no preventive care reminders to display for this patient.   Lab Results  Component Value Date   TSH 1.19  03/31/2021   Lab Results  Component Value Date   WBC  4.1 03/31/2021   HGB 13.7 03/31/2021   HCT 41.6 03/31/2021   MCV 85.4 03/31/2021   PLT 224.0 03/31/2021   Lab Results  Component Value Date   NA 139 03/31/2021   K 3.9 03/31/2021   CO2 28 03/31/2021   GLUCOSE 88 03/31/2021   BUN 14 03/31/2021   CREATININE 0.84 03/31/2021   BILITOT 0.4 03/31/2021   ALKPHOS 177 (H) 03/31/2021   AST 19 03/31/2021   ALT 24 03/31/2021   PROT 7.6 03/31/2021   ALBUMIN 4.1 03/31/2021   CALCIUM 9.1 03/31/2021   ANIONGAP 11 07/21/2019   GFR 76.80 03/31/2021   Lab Results  Component Value Date   CHOL 184 03/31/2021   Lab Results  Component Value Date   HDL 61.20 03/31/2021   Lab Results  Component Value Date   LDLCALC 106 (H) 03/31/2021   Lab Results  Component Value Date   TRIG 83.0 03/31/2021   Lab Results  Component Value Date   CHOLHDL 3 03/31/2021   Lab Results  Component Value Date   HGBA1C 5.6 03/05/2019       Assessment & Plan:    1. Fever, unspecified fever cause - POCT Influenza A/B  2. Influenza A Flu A positive Tamiflu, Mucinex, Flonase, Claritin or Zyrtec, Tylenol, Ibuprofen, etc. Continue supportive measures including rest, hydration, humidifier use, steam showers, warm compresses to sinuses, warm liquids with lemon and honey, and over-the-counter cough, cold, and analgesics as needed.   Patient aware of signs/symptoms requiring further/urgent evaluation.   - guaiFENesin (MUCINEX) 600 MG 12 hr tablet; Take 2 tablets (1,200 mg total) by mouth 2 (two) times daily.  Dispense: 30 tablet; Refill: 2 - oseltamivir (TAMIFLU) 75 MG capsule; Take 1 capsule (75 mg total) by mouth 2 (two) times daily for 5 days.  Dispense: 10 capsule; Refill: 0   Please contact office for follow-up if symptoms do not improve or worsen. Seek emergency care if symptoms become severe.   Terrilyn Saver, NP

## 2021-04-14 ENCOUNTER — Other Ambulatory Visit: Payer: Self-pay | Admitting: *Deleted

## 2021-04-14 DIAGNOSIS — R748 Abnormal levels of other serum enzymes: Secondary | ICD-10-CM

## 2021-04-14 DIAGNOSIS — E559 Vitamin D deficiency, unspecified: Secondary | ICD-10-CM

## 2021-04-14 MED ORDER — VITAMIN D (ERGOCALCIFEROL) 1.25 MG (50000 UNIT) PO CAPS: 50000.0000 [IU] | ORAL_CAPSULE | ORAL | 0 refills | Status: DC

## 2021-04-15 ENCOUNTER — Telehealth: Payer: Self-pay | Admitting: Neurology

## 2021-04-15 NOTE — Telephone Encounter (Signed)
Patient is returning a call for results

## 2021-04-15 NOTE — Telephone Encounter (Signed)
Pt advised of her MRA of head.

## 2021-04-20 ENCOUNTER — Encounter: Payer: Self-pay | Admitting: Physician Assistant

## 2021-04-20 ENCOUNTER — Other Ambulatory Visit: Payer: Self-pay

## 2021-04-20 ENCOUNTER — Ambulatory Visit (INDEPENDENT_AMBULATORY_CARE_PROVIDER_SITE_OTHER): Payer: Managed Care, Other (non HMO) | Admitting: Physician Assistant

## 2021-04-20 VITALS — BP 123/72 | HR 90 | Resp 18 | Ht 65.0 in | Wt 208.0 lb

## 2021-04-20 DIAGNOSIS — G43009 Migraine without aura, not intractable, without status migrainosus: Secondary | ICD-10-CM | POA: Diagnosis not present

## 2021-04-20 DIAGNOSIS — Z8679 Personal history of other diseases of the circulatory system: Secondary | ICD-10-CM

## 2021-04-20 DIAGNOSIS — I671 Cerebral aneurysm, nonruptured: Secondary | ICD-10-CM | POA: Diagnosis not present

## 2021-04-20 NOTE — Progress Notes (Signed)
NEUROLOGY FOLLOW UP OFFICE NOTE  ALEESA SWEIGERT [id number]  Assessment/Plan:   History of cerebral aneurysm rupture s/p clipping Migraine without aura, without status migrainosus, not intractable   1 Follow up in 6 months (sooner if needed)  Subjective:  Pleas Patricia. 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 MRA head negative for acute findings. Sequelae of prior surgical clipping of ACA with persistent faint 2 mm remnant is stable and essentially unchanged.       UPDATE: She had the flu on 04/13/21 triggering mild headaches that scared her. She has had a mild headache from time to time, infrequent.  No headaches which she would classify as a migraine.  She still reports a persistent right ear pressure since the surgery, sometimes pain with negative ENT workup.  Tries to stay active.  Current NSAIDS/analgesics:  Tylenol, rarely ibuprofen Current triptans:  none Current ergotamine:  none Current anti-emetic:  none Current muscle relaxants:  none Current Antihypertensive medications:  amlodipine Current Antidepressant medications:  none Current Anticonvulsant medications:  none Current anti-CGRP:  none Current Vitamins/Herbal/Supplements:  none Current Antihistamines/Decongestants:  Flonase Other therapy:  none Hormone/birth control:  none  Caffeine:  No coffee or cola.  Diet:  Drinks water 64 oz a day.  Usually wouldn't skip meals but she has since topiramate Exercise:  treadmill and elliptical  Depression: no ; Anxiety:  yes Other pain:  Localized head pain from surgery Sleep hygiene:  Sleeps okay.  Feels  less tired  HISTORY: She has longstanding history of headaches since young adulthood, which she says have been attributed to allergies and sinus issues. In 2016, she reported different type of headache.  They were right sided, throbbing/pressure and  8/10 intensity.  At that time, they lasted all day and occurred daily.  It is associated with nausea, photophobia, phonophobia and osmophobia.  She also reported seeing floaters but her eye doctor said it was normal for her age.  She also feels a little dizzy.  She would Advil or Tylenol almost daily, with variable effect.  She denies family history of headache. She then has had episodes of severe dizziness, not associated with headache.  It is not spinning but it is a sense of movement and feeling off-balance.  There is mild nausea but no vomiting.  It occurs spontaneously throughout the day and each episode can last up to 4 hours non-stop.  She noted some bilateral tinnitus.  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  She subsequently established care with another neurologist, Dr. Felecia Shelling, who recommended close follow up.  She was lost to follow up with Dr. Felecia Shelling due to the COVID-19 pandemic until she saw him again in April 2021 and started her on topiramate.  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     Prior to the aneurysm rupture, migraines were infrequent,about once a month, and responded within 1 hour with Tylenol.  Since the surgery, she has had some head pain associated with the surgery but no migraines.  However, she has had unwanted side effects from topiramate, such as blurred vision and appetite  suppression, causing 30 lb weight loss.  She would like to discontinue it.   Imaging personally reviewed:  03/11/2015 MRI BRAIN W WO:  1.  No acute intracranial abnormality.  2. Mild to moderate for age nonspecific mostly subcortical cerebral white matter signal changes. Solitary small nonspecific focus of T2 hyperintensity  in the cerebellum, most resembling a chronic lacune. 04/16/2015 CTA HEAD:  1. 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  2. Otherwise normal CTA circle of Willis without focal stenosis, other aneurysm, or branch vessel occlusion.  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 Mild extent of white matter foci in the subcortical white matter in the left cerebral hemisphere consistent with mild chronic microvascular ischemic changes. None of these appear to be acute and there is no change when compared to the MRI dated 03/11/2015.  07/21/2019 CT BRAIN WO: 1. There are findings concerning for potential subarachnoid hemorrhage, although this could simply reflect "pseudo subarachnoid hemorrhage" related to interval development of mild hydrocephalus compared to prior study from 2017. Given the patient's documented history of ACA aneurysm, further evaluation with repeat head CTA is recommended at this time. 07/21/2019 CTA HEAD & NECK:  1. 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  Trace subarachnoid hemorrhage including trace IVH visible now. Mild proximal ACA vasospasm. Acute ventriculomegaly, although no definite transependymal edema.  Note also dominant Left ACA A1. 2. Elsewhere stable and negative CTA head and neck. No atherosclerosis or arterial stenosis identified. 03/31/21 MRA Head  without contrast: Anterior circulation: Examination mildly degraded by motion artifact.  Both internal carotid arteries remain widely patent to the termini without stenosis or other new abnormality. Dominant left A1 remains widely patent. Right A1 hypoplastic and/or absent. Susceptibility  artifact related to surgical clipping of an anterior communicating artery aneurysm again seen. Aneurysm remains largely excluded. Previously identified 2 mm remnant at the aneurysm  base is likely not significantly changed, although is more difficult to see on today's exam (series 10, image 11). No other evidence for recanalization. Anterior cerebral arteries remain widely patent distally. Both M1 segments, MCA bifurcations, and distal MCA branches remain widely patent without abnormality.  Posterior circulation: Visualized vertebral arteries widely patent.Both PICA origins patent and normal. Basilar widely patent to its distal aspect. Superior cerebellar arteries patent bilaterally. Both PCAs remain widely patent and well perfused to their distal aspects. Small left posterior communicating artery noted.   Anatomic variants: Hypoplastic/absent right A1.  Other: No new aneurysm.   Past NSAIDS/analgesics:  none Past abortive triptans:  none Past abortive ergotamine:  none Past muscle relaxants:  none Past anti-emetic:  Zofran ODT 4mg  Past antihypertensive medications:  Metoprolol succinate, losartan, valsartain, HCTZ Past antidepressant medications:  Sertraline 25mg , Wellbutrin Past anticonvulsant medications:  topiramate 50mg  QHS Past anti-CGRP:  none Past vitamins/Herbal/Supplements:  none Past antihistamines/decongestants:  none Other past therapies:  none    Family history of headache or aneurysm:  no  PAST MEDICAL HISTORY: Past Medical History:  Diagnosis Date   Allergic rhinitis, cause unspecified    Asthma    Eczema    Food allergy    Gallbladder problem    Headache    Hypertension     MEDICATIONS: Current Outpatient Medications on File Prior to Visit  Medication Sig Dispense Refill   Vitamin D, Ergocalciferol, (DRISDOL) 1.25 MG (50000 UNIT) CAPS capsule Take 1 capsule (50,000 Units  total) by mouth every 7 (seven) days. 12 capsule 0   albuterol (VENTOLIN HFA) 108 (90 Base) MCG/ACT inhaler Inhale 2 puffs into the lungs every 6 (six) hours as needed for wheezing or shortness of breath. 18 g 1   amLODipine (NORVASC) 5 MG tablet TAKE 1 TABLET(5 MG) BY  MOUTH DAILY 90 tablet 1   azelastine (OPTIVAR) 0.05 % ophthalmic solution Place 1 drop into both eyes 2 (two) times daily. (Patient not taking: Reported on 04/13/2021) 6 mL 12   clindamycin (CLEOCIN-T) 1 % lotion Apply topically 2 (two) times daily. 60 mL 0   EPINEPHrine 0.3 mg/0.3 mL IJ SOAJ injection Inject into upper thigh for severe allergic reaction 2 each 1   fluticasone (FLONASE) 50 MCG/ACT nasal spray SHAKE LIQUID AND USE 2 SPRAYS IN EACH NOSTRIL EVERY DAY 16 g 2   fluticasone furoate-vilanterol (BREO ELLIPTA) 100-25 MCG/INH AEPB Inhale 1 puff into the lungs daily. 1 each 5   guaiFENesin (MUCINEX) 600 MG 12 hr tablet Take 2 tablets (1,200 mg total) by mouth 2 (two) times daily. 30 tablet 2   losartan (COZAAR) 50 MG tablet Take 1 tablet (50 mg total) by mouth daily. (Patient not taking: Reported on 04/13/2021) 30 tablet 2   pantoprazole (PROTONIX) 40 MG tablet Take 1 tablet (40 mg total) by mouth daily. 90 tablet 3   No current facility-administered medications on file prior to visit.    ALLERGIES: Allergies  Allergen Reactions   Other Anaphylaxis    Nuts    Chlorhexidine Gluconate Itching    FAMILY HISTORY: Family History  Problem Relation Age of Onset   Asthma Mother    Allergies Mother    Asthma Father    Drug abuse Father    Allergies Father       BP 123/72    Pulse 90    Resp 18    Ht 5\' 5"  (1.651 m)    Wt 208 lb (94.3 kg)    LMP 09/19/2015    SpO2 99%    BMI 34.61 kg/m    General: No acute distress.  Patient appears well-groomed.   Head:  Normocephalic/atraumatic Eyes:  fundi examined but not visualized Neck: supple, no paraspinal tenderness, full range of motion Back: No paraspinal tenderness Heart: regular rate and rhythm Lungs: Clear to auscultation bilaterally. Vascular: No carotid bruits. Neurological Exam: Mental status: alert and oriented to person, place, and time, recent and remote memory intact, fund of knowledge intact, attention and concentration  intact, speech fluent and not dysarthric, language intact. Cranial nerves: CN I: not tested CN II: pupils equal, round and reactive to light, visual fields intact CN III, IV, VI:  full range of motion, no nystagmus, no ptosis CN V: facial sensation intact. CN VII: upper and lower face symmetric CN VIII: hearing intact CN IX, X: gag intact, uvula midline CN XI: sternocleidomastoid and trapezius muscles intact CN XII: tongue midline Bulk & Tone: normal, no fasciculations. Motor:  muscle strength 5/5 throughout Sensation:  Pinprick, temperature and vibratory sensation intact. Deep Tendon Reflexes:  2+ throughout,  toes downgoing.   Finger to nose testing:  Without dysmetria.   Heel to shin:  Without dysmetria.   Gait:  Normal station and stride.  Romberg negative.      Sharene Butters, PA-C   CC: Roma Schanz, DO

## 2021-04-20 NOTE — Patient Instructions (Signed)
Follow-up 6 months

## 2021-04-29 ENCOUNTER — Other Ambulatory Visit: Payer: Self-pay

## 2021-04-29 ENCOUNTER — Ambulatory Visit (HOSPITAL_BASED_OUTPATIENT_CLINIC_OR_DEPARTMENT_OTHER)
Admission: RE | Admit: 2021-04-29 | Discharge: 2021-04-29 | Disposition: A | Discharge status: Home / Self Care | Payer: Managed Care, Other (non HMO) | Source: Ambulatory Visit | Attending: Family Medicine | Admitting: Family Medicine

## 2021-04-29 DIAGNOSIS — R55 Syncope and collapse: Secondary | ICD-10-CM | POA: Insufficient documentation

## 2021-04-29 LAB — ECHOCARDIOGRAM COMPLETE
AR max vel: 2.05 cm2
AV Area VTI: 2.02 cm2
AV Area mean vel: 1.95 cm2
AV Mean grad: 4 mmHg
AV Peak grad: 7.8 mmHg
Ao pk vel: 1.4 m/s
Area-P 1/2: 3.97 cm2
S' Lateral: 3.4 cm

## 2021-04-29 NOTE — Progress Notes (Signed)
°  Echocardiogram 2D Echocardiogram has been performed.  Merrie Roof F 04/29/2021, 3:49 PM

## 2021-05-09 IMAGING — CT CT ANGIO HEAD
1 of 14 series · 3 of 33 positions shown · IV contrast (Omnipaque)
Comparison: Plain head CT 0079 hours today, CTA head 04/16/2015 and
intracranial MRA 04/04/2015.
COMPARISON: Plain head CT 0079 hours today, CTA head 04/16/2015 and
intracranial MRA 04/04/2015.

Addendum:
CLINICAL DATA: 58-year-old female with episode about 1 week ago of
severe headache and neck pain after working in yard. History of
small anterior communicating artery aneurysm. Abnormal plain head CT
today.

EXAM:
CT ANGIOGRAPHY HEAD AND NECK
TECHNIQUE: Multidetector CT imaging of the head and neck was performed using
the standard protocol during bolus administration of intravenous
contrast. Multiplanar CT image reconstructions and MIPs were
obtained to evaluate the vascular anatomy. Carotid stenosis
measurements (when applicable) are obtained utilizing NASCET
criteria, using the distal internal carotid diameter as the
denominator.
CONTRAST:  100mL OMNIPAQUE IOHEXOL 350 MG/ML SOLN

[Series 12: axial thin · axial · 0.49mm/px · z∈[-393,-61]mm · 3 of 337 slices shown]
[im 1/337  soft-tissue]
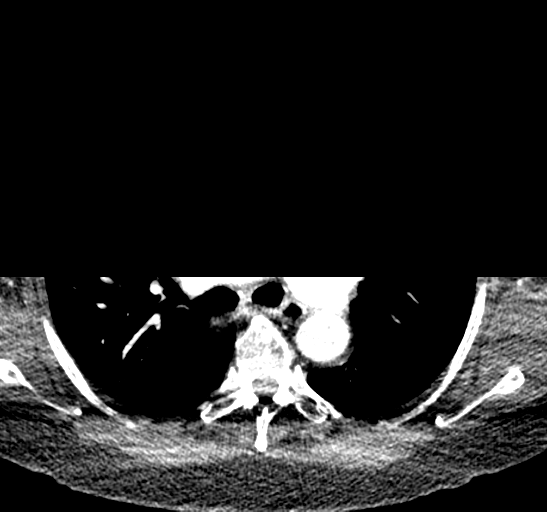
[im 169/337  bone]
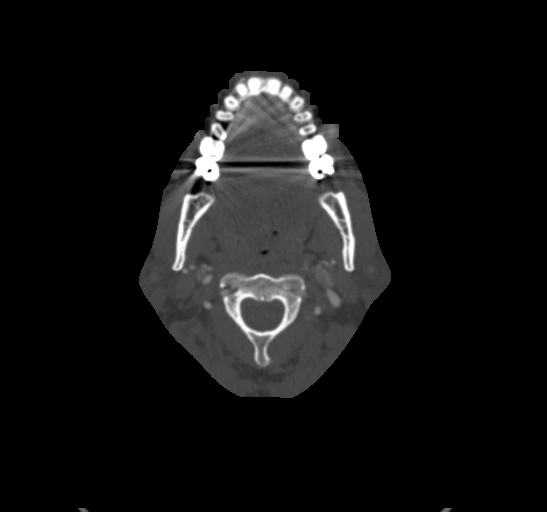
[im 337/337  soft-tissue]
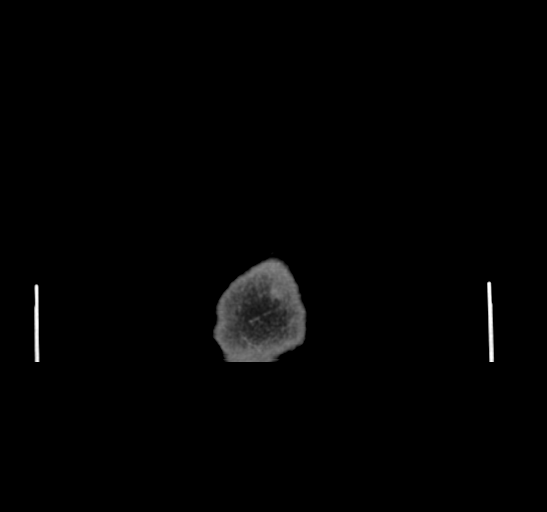

[3 of 33 positions shown; findings below may reference images not displayed]

FINDINGS: CT HEAD

Brain: As described earlier today, ventricle size has enlarged since
6940, and there is now trace subarachnoid hemorrhage suspected
layering in both occipital horns (series 6, image 14). No definite
transependymal edema. Suspicious hyperdense sulci again noted (a
especially left pericallosal sulci on sagittal image 35) compatible
with trace subarachnoid hemorrhage.

Stable gray-white matter differentiation throughout the brain. No
cortically based acute infarct identified. No midline shift. Basilar
cisterns remain patent.

Calvarium and skull base: Stable, negative.

Paranasal sinuses: Stable in clear.

Orbits: No acute orbit or scalp soft tissue finding.

CTA NECK

Skeleton: No acute osseous abnormality identified.

Upper chest: Negative.

Other neck: Negative; the glottis is closed.

Aortic arch: 3 vessel arch configuration with no arch
atherosclerosis.

Right carotid system: Proximal right CCA partially obscured by dense
right subclavian vein contrast streak artifact. Visible right CCA,
along with the right carotid bifurcation and cervical right ICA are
negative.

Left carotid system: Negative aside from tortuosity.

Vertebral arteries:
A portion of the proximal right subclavian artery is obscured by
dense subclavian vein contrast but the right vertebral artery origin
is patent and normal. Some of the right V1 segment is obscured, but
otherwise the right vertebral artery is patent with occasional
tortuosity to the skull base. No plaque or stenosis.

Normal proximal left subclavian artery, left vertebral artery
origin. Intermittently tortuous but otherwise normal left vertebral
to the skull base.

CTA HEAD

Posterior circulation: Codominant distal vertebral arteries are
patent without stenosis. Normal PICA origins and vertebrobasilar
junction. Patent basilar artery without stenosis. Normal SCA and PCA
origins. A left posterior communicating artery is present, the right
is diminutive or absent as before. Bilateral PCA branches are stable
and within normal limits.

Anterior circulation: Both ICA siphons are patent without plaque or
stenosis. The right siphon appears dominant as before. Patent
carotid termini. Dominant left ACA A1, unchanged.

Substantially enlarged and now irregular appearing anterior
communicating artery aneurysm, up to 6 mm now, only 1-2 mm
previously. See series 11, images 191 and 193, series 16, image 20,
series 14, image 107, and series 15, image 49. The A2 segments
remain patent. There is mild proximal A2 segment irregularity likely
vasospasm. Distal ACA branches appears stable and within normal
limits.

Left MCA M1 segment and bifurcation are patent without stenosis.
Right MCA M1 segment and bifurcation are patent without stenosis.
Bilateral MCA branches are stable and within normal limits.

Venous sinuses: Patent, the left transverse and sigmoid sinuses
appear dominant as before.

Anatomic variants: Dominant left ACA A1 and mildly dominant left
ICA.

Review of the MIP images confirms the above findings
IMPRESSION: 1. Constellation most compatible with Ruptured Anterior
Communicating Artery Aneurysm, which has substantially enlarged
since 6940 - now 6 mm.
Trace subarachnoid hemorrhage including trace IVH visible now. Mild
proximal ACA vasospasm. Acute ventriculomegaly, although no definite
transependymal edema.
Note also dominant Left ACA A1.

2. Elsewhere stable and negative CTA head and neck. No
atherosclerosis or arterial stenosis identified.

ADDENDUM:
Critical Value/emergent results were called by telephone at the time
of interpretation on 07/21/2019 at 3344 hours to Dr. ATURO SOM
, who verbally acknowledged these results.

*** End of Addendum ***
FINDINGS: CT HEAD

Brain: As described earlier today, ventricle size has enlarged since
6940, and there is now trace subarachnoid hemorrhage suspected
layering in both occipital horns (series 6, image 14). No definite
transependymal edema. Suspicious hyperdense sulci again noted (a
especially left pericallosal sulci on sagittal image 35) compatible
with trace subarachnoid hemorrhage.

Stable gray-white matter differentiation throughout the brain. No
cortically based acute infarct identified. No midline shift. Basilar
cisterns remain patent.

Calvarium and skull base: Stable, negative.

Paranasal sinuses: Stable in clear.

Orbits: No acute orbit or scalp soft tissue finding.

CTA NECK

Skeleton: No acute osseous abnormality identified.

Upper chest: Negative.

Other neck: Negative; the glottis is closed.

Aortic arch: 3 vessel arch configuration with no arch
atherosclerosis.

Right carotid system: Proximal right CCA partially obscured by dense
right subclavian vein contrast streak artifact. Visible right CCA,
along with the right carotid bifurcation and cervical right ICA are
negative.

Left carotid system: Negative aside from tortuosity.

Vertebral arteries:
A portion of the proximal right subclavian artery is obscured by
dense subclavian vein contrast but the right vertebral artery origin
is patent and normal. Some of the right V1 segment is obscured, but
otherwise the right vertebral artery is patent with occasional
tortuosity to the skull base. No plaque or stenosis.

Normal proximal left subclavian artery, left vertebral artery
origin. Intermittently tortuous but otherwise normal left vertebral
to the skull base.

CTA HEAD

Posterior circulation: Codominant distal vertebral arteries are
patent without stenosis. Normal PICA origins and vertebrobasilar
junction. Patent basilar artery without stenosis. Normal SCA and PCA
origins. A left posterior communicating artery is present, the right
is diminutive or absent as before. Bilateral PCA branches are stable
and within normal limits.

Anterior circulation: Both ICA siphons are patent without plaque or
stenosis. The right siphon appears dominant as before. Patent
carotid termini. Dominant left ACA A1, unchanged.

Substantially enlarged and now irregular appearing anterior
communicating artery aneurysm, up to 6 mm now, only 1-2 mm
previously. See series 11, images 191 and 193, series 16, image 20,
series 14, image 107, and series 15, image 49. The A2 segments
remain patent. There is mild proximal A2 segment irregularity likely
vasospasm. Distal ACA branches appears stable and within normal
limits.

Left MCA M1 segment and bifurcation are patent without stenosis.
Right MCA M1 segment and bifurcation are patent without stenosis.
Bilateral MCA branches are stable and within normal limits.

Venous sinuses: Patent, the left transverse and sigmoid sinuses
appear dominant as before.

Anatomic variants: Dominant left ACA A1 and mildly dominant left
ICA.

Review of the MIP images confirms the above findings
IMPRESSION: 1. Constellation most compatible with Ruptured Anterior
Communicating Artery Aneurysm, which has substantially enlarged
since 6940 - now 6 mm.
Trace subarachnoid hemorrhage including trace IVH visible now. Mild
proximal ACA vasospasm. Acute ventriculomegaly, although no definite
transependymal edema.
Note also dominant Left ACA A1.

2. Elsewhere stable and negative CTA head and neck. No
atherosclerosis or arterial stenosis identified.

## 2021-07-02 LAB — HM MAMMOGRAPHY

## 2021-07-06 ENCOUNTER — Encounter: Payer: Self-pay | Admitting: *Deleted

## 2021-08-02 ENCOUNTER — Other Ambulatory Visit: Payer: Self-pay | Admitting: Family Medicine

## 2021-09-15 ENCOUNTER — Other Ambulatory Visit: Payer: Self-pay | Admitting: Family Medicine

## 2021-09-15 DIAGNOSIS — I10 Essential (primary) hypertension: Secondary | ICD-10-CM

## 2021-10-19 ENCOUNTER — Ambulatory Visit: Payer: Managed Care, Other (non HMO) | Admitting: Family Medicine

## 2021-10-19 ENCOUNTER — Encounter: Payer: Self-pay | Admitting: Family Medicine

## 2021-10-19 VITALS — BP 126/90 | HR 98 | Temp 98.3°F | Resp 18 | Ht 65.0 in | Wt 191.4 lb

## 2021-10-19 DIAGNOSIS — C8589 Other specified types of non-Hodgkin lymphoma, extranodal and solid organ sites: Secondary | ICD-10-CM | POA: Diagnosis not present

## 2021-10-19 DIAGNOSIS — R5383 Other fatigue: Secondary | ICD-10-CM | POA: Diagnosis not present

## 2021-10-19 DIAGNOSIS — I10 Essential (primary) hypertension: Secondary | ICD-10-CM

## 2021-10-19 DIAGNOSIS — E559 Vitamin D deficiency, unspecified: Secondary | ICD-10-CM | POA: Diagnosis not present

## 2021-10-19 DIAGNOSIS — R1013 Epigastric pain: Secondary | ICD-10-CM

## 2021-10-19 DIAGNOSIS — R591 Generalized enlarged lymph nodes: Secondary | ICD-10-CM | POA: Diagnosis not present

## 2021-10-19 DIAGNOSIS — B2709 Gammaherpesviral mononucleosis with other complications: Secondary | ICD-10-CM

## 2021-10-19 LAB — CBC WITH DIFFERENTIAL/PLATELET
Basophils Absolute: 0 10*3/uL (ref 0.0–0.1)
Basophils Relative: 1 % (ref 0.0–3.0)
Eosinophils Absolute: 0.2 10*3/uL (ref 0.0–0.7)
Eosinophils Relative: 4.9 % (ref 0.0–5.0)
HCT: 40.1 % (ref 36.0–46.0)
Hemoglobin: 13.1 g/dL (ref 12.0–15.0)
Lymphocytes Relative: 24.4 % (ref 12.0–46.0)
Lymphs Abs: 1.2 10*3/uL (ref 0.7–4.0)
MCHC: 32.7 g/dL (ref 30.0–36.0)
MCV: 84.9 fl (ref 78.0–100.0)
Monocytes Absolute: 0.4 10*3/uL (ref 0.1–1.0)
Monocytes Relative: 9 % (ref 3.0–12.0)
Neutro Abs: 2.9 10*3/uL (ref 1.4–7.7)
Neutrophils Relative %: 60.7 % (ref 43.0–77.0)
Platelets: 243 10*3/uL (ref 150.0–400.0)
RBC: 4.73 Mil/uL (ref 3.87–5.11)
RDW: 14.7 % (ref 11.5–15.5)
WBC: 4.7 10*3/uL (ref 4.0–10.5)

## 2021-10-19 LAB — MONONUCLEOSIS SCREEN: Mono Screen: NEGATIVE

## 2021-10-19 MED ORDER — VITAMIN D (ERGOCALCIFEROL) 1.25 MG (50000 UNIT) PO CAPS: 50000.0000 [IU] | ORAL_CAPSULE | ORAL | 2 refills | Status: DC

## 2021-10-19 MED ORDER — PANTOPRAZOLE SODIUM 40 MG PO TBEC: 40.0000 mg | DELAYED_RELEASE_TABLET | Freq: Every day | ORAL | 3 refills | Status: DC

## 2021-10-19 MED ORDER — CEPHALEXIN 500 MG PO CAPS: 500.0000 mg | ORAL_CAPSULE | Freq: Two times a day (BID) | ORAL | 0 refills | Status: DC

## 2021-10-19 NOTE — Assessment & Plan Note (Signed)
Check labs  kkeflex x 10 days If no better ----  Consider US/ CT

## 2021-10-19 NOTE — Progress Notes (Signed)
Subjective:   By signing my name below, I, Carylon Perches, attest that this documentation has been prepared under the direction and in the presence of Ann Held DO 10/19/2021   Patient ID: Ruth [name], female    DOB: 05-10-1960, 61 y.o.   MRN: [id number]  Chief Complaint  Patient presents with   Swollen Glands    X2 weeks, pt states having swollen glands and bilateral ear pain. No sore throat    HPI Patient is in today for an office visit  She is requesting a refill of 40 Mg of Pantoprazole and 1.25 Mg of Drisdol.   She complains of swollen glands that she noticed two weeks ago. She has associated symptoms of ear pain and fatigue but she believes that her ear pain symptoms could be due to allergies. She denies of any fever. She states that the size of her glands are decreasing. She has been using nose spray, Zyrtec and Claritin. She is not sure that she's been around some whose had Mononucleosis.   Past Medical History:  Diagnosis Date   Allergic rhinitis, cause unspecified    Asthma    Eczema    Food allergy    Gallbladder problem    Headache    Hypertension     Past Surgical History:  Procedure Laterality Date   CESAREAN SECTION     CESAREAN SECTION Bilateral [id number]    Family History  Problem Relation Age of Onset   Asthma Mother    Allergies Mother    Asthma Father    Drug abuse Father    Allergies Father     Social History   Socioeconomic History   Marital status: Single    Spouse name: Not on file   Number of children: Not on file   Years of education: Not on file   Highest education level: Not on file  Occupational History   Occupation: Juvenile Counsler    Comment: guard   Tobacco Use   Smoking status: Never   Smokeless tobacco: Never  Substance and Sexual Activity   Alcohol use: Yes    Alcohol/week: 0.0 standard drinks of alcohol    Comment: one glass of wine per week/fim   Drug use: No   Sexual activity: Yes    Partners:  Male  Other Topics Concern   Not on file  Social History Narrative   Exercise--no   Right handed   Social Determinants of Health   Financial Resource Strain: Not on file  Food Insecurity: Not on file  Transportation Needs: Not on file  Physical Activity: Not on file  Stress: Not on file  Social Connections: Not on file  Intimate Partner Violence: Not on file    Outpatient Medications Prior to Visit  Medication Sig Dispense Refill   albuterol (VENTOLIN HFA) 108 (90 Base) MCG/ACT inhaler Inhale 2 puffs into the lungs every 6 (six) hours as needed for wheezing or shortness of breath. 18 g 1   amLODipine (NORVASC) 5 MG tablet TAKE 1 TABLET(5 MG) BY MOUTH DAILY 90 tablet 1   clindamycin (CLEOCIN-T) 1 % lotion Apply topically 2 (two) times daily. 60 mL 0   EPINEPHrine 0.3 mg/0.3 mL IJ SOAJ injection Inject into upper thigh for severe allergic reaction 2 each 1   fluticasone (FLONASE) 50 MCG/ACT nasal spray SHAKE LIQUID AND USE 2 SPRAYS IN EACH NOSTRIL EVERY DAY 16 g 2   fluticasone furoate-vilanterol (BREO ELLIPTA) 100-25 MCG/INH AEPB Inhale 1 puff into the  lungs daily. 1 each 5   guaiFENesin (MUCINEX) 600 MG 12 hr tablet Take 2 tablets (1,200 mg total) by mouth 2 (two) times daily. 30 tablet 2   pantoprazole (PROTONIX) 40 MG tablet Take 1 tablet (40 mg total) by mouth daily. 90 tablet 3   Vitamin D, Ergocalciferol, (DRISDOL) 1.25 MG (50000 UNIT) CAPS capsule TAKE 1 CAPSULE BY MOUTH EVERY 7 DAYS 12 capsule 0   azelastine (OPTIVAR) 0.05 % ophthalmic solution Place 1 drop into both eyes 2 (two) times daily. (Patient not taking: Reported on 04/20/2021) 6 mL 12   losartan (COZAAR) 50 MG tablet Take 1 tablet (50 mg total) by mouth daily. 30 tablet 2   No facility-administered medications prior to visit.    Allergies  Allergen Reactions   Other Anaphylaxis    Nuts    Chlorhexidine Gluconate Itching    Review of Systems  Constitutional:  Positive for malaise/fatigue. Negative for fever.   HENT:  Positive for ear pain. Negative for congestion.        (+) Swollen Glands  Eyes:  Negative for blurred vision.  Respiratory:  Negative for cough and shortness of breath.   Cardiovascular:  Negative for chest pain, palpitations and leg swelling.  Gastrointestinal:  Negative for vomiting.  Musculoskeletal:  Negative for back pain.  Skin:  Negative for rash.  Neurological:  Negative for loss of consciousness and headaches.       Objective:    Physical Exam Vitals and nursing note reviewed.  Constitutional:      General: She is not in acute distress.    Appearance: Normal appearance. She is not ill-appearing.  HENT:     Head: Normocephalic and atraumatic.     Right Ear: External ear normal.     Left Ear: External ear normal.  Eyes:     Extraocular Movements: Extraocular movements intact.     Pupils: Pupils are equal, round, and reactive to light.  Neck:     Comments: Two small nodes Right Poster cervical and anterior Lymphadenopathy:     Cervical: Cervical adenopathy present.     Right cervical: Posterior cervical adenopathy (and anterior) present.  Skin:    General: Skin is warm and dry.  Neurological:     Mental Status: She is alert and oriented to person, place, and time.  Psychiatric:        Mood and Affect: Mood normal.        Behavior: Behavior normal.        Judgment: Judgment normal.     BP 126/90 (BP Location: Left Arm, Patient Position: Sitting, Cuff Size: Large)   Pulse 98   Temp 98.3 F (36.8 C) (Oral)   Resp 18   Ht '5\' 5"'$  (1.651 m)   Wt 191 lb 6.4 oz (86.8 kg)   LMP 09/19/2015   SpO2 99%   BMI 31.85 kg/m  Wt Readings from Last 3 Encounters:  10/19/21 191 lb 6.4 oz (86.8 kg)  04/20/21 208 lb (94.3 kg)  04/13/21 208 lb 3.2 oz (94.4 kg)    Diabetic Foot Exam - Simple   No data filed    Lab Results  Component Value Date   WBC 4.1 03/31/2021   HGB 13.7 03/31/2021   HCT 41.6 03/31/2021   PLT 224.0 03/31/2021   GLUCOSE 88 03/31/2021    CHOL 184 03/31/2021   TRIG 83.0 03/31/2021   HDL 61.20 03/31/2021   LDLCALC 106 (H) 03/31/2021   ALT 24 03/31/2021   AST 19  03/31/2021   NA 139 03/31/2021   K 3.9 03/31/2021   CL 104 03/31/2021   CREATININE 0.84 03/31/2021   BUN 14 03/31/2021   CO2 28 03/31/2021   TSH 1.19 03/31/2021   HGBA1C 5.6 03/05/2019   MICROALBUR 1.1 07/03/2012    Lab Results  Component Value Date   TSH 1.19 03/31/2021   Lab Results  Component Value Date   WBC 4.1 03/31/2021   HGB 13.7 03/31/2021   HCT 41.6 03/31/2021   MCV 85.4 03/31/2021   PLT 224.0 03/31/2021   Lab Results  Component Value Date   NA 139 03/31/2021   K 3.9 03/31/2021   CO2 28 03/31/2021   GLUCOSE 88 03/31/2021   BUN 14 03/31/2021   CREATININE 0.84 03/31/2021   BILITOT 0.4 03/31/2021   ALKPHOS 177 (H) 03/31/2021   AST 19 03/31/2021   ALT 24 03/31/2021   PROT 7.6 03/31/2021   ALBUMIN 4.1 03/31/2021   CALCIUM 9.1 03/31/2021   ANIONGAP 11 07/21/2019   GFR 76.80 03/31/2021   Lab Results  Component Value Date   CHOL 184 03/31/2021   Lab Results  Component Value Date   HDL 61.20 03/31/2021   Lab Results  Component Value Date   LDLCALC 106 (H) 03/31/2021   Lab Results  Component Value Date   TRIG 83.0 03/31/2021   Lab Results  Component Value Date   CHOLHDL 3 03/31/2021   Lab Results  Component Value Date   HGBA1C 5.6 03/05/2019       Assessment & Plan:   Problem List Items Addressed This Visit       Unprioritized   Vitamin D deficiency - Primary   Relevant Medications   Vitamin D, Ergocalciferol, (DRISDOL) 1.25 MG (50000 UNIT) CAPS capsule   Lymphadenopathy    Check labs  kkeflex x 10 days If no better ----  Consider US/ CT      Relevant Medications   cephALEXin (KEFLEX) 500 MG capsule   Other Relevant Orders   CBC with Differential/Platelet   Epstein-Barr virus VCA antibody panel   Monospot   Essential hypertension    Well controlled, no changes to meds. Encouraged heart healthy diet  such as the DASH diet and exercise as tolerated.       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    Dispense:  90 tablet    Refill:  3   Vitamin D, Ergocalciferol, (DRISDOL) 1.25 MG (50000 UNIT) CAPS capsule    Sig: Take 1 capsule (50,000 Units total) by mouth every 7 (seven) days.    Dispense:  12 capsule    Refill:  2   cephALEXin (KEFLEX) 500 MG capsule    Sig: Take 1 capsule (500 mg total) by mouth 2 (two) times daily.    Dispense:  20 capsule    Refill:  0    I, Rosalita Chessman  Chase, DO, personally preformed the services described in this documentation.  All medical record entries made by the scribe were at my direction and in my presence.  I have reviewed the chart and discharge instructions (if applicable) and agree that the record reflects my personal performance and is accurate and complete. 10/19/2021   I,Amber Collins,acting as a scribe for [employer], DO.,have documented all relevant documentation on the behalf of Ann Held, DO,as directed by  Ann Held, DO while in the presence of Ann Held, DO.    Ann Held, DO

## 2021-10-19 NOTE — Patient Instructions (Signed)
Lymphadenopathy  Lymphadenopathy means that your lymph glands are swollen or larger than normal. Lymph glands, also called lymph nodes, are collections of tissue that filter excess fluid, bacteria, viruses, and waste from your bloodstream. They are part of your body's disease-fighting system (immune system), which protects your body from germs. There may be different causes of lymphadenopathy, depending on where it is in your body. Some types go away on their own. Lymphadenopathy can occur anywhere that you have lymph glands, including these areas: Neck (cervical lymphadenopathy). Chest (mediastinal lymphadenopathy). Lungs (hilar lymphadenopathy). Underarms (axillary lymphadenopathy). Groin (inguinal lymphadenopathy). When your immune system responds to germs, infection-fighting cells and fluid build up in your lymph glands. This causes some swelling and enlargement. If the lymph nodes do not go back to normal size after you have an infection or disease, your health care provider may do tests. These tests help to monitor your condition and find the reason why the glands are still swollen and enlarged. Follow these instructions at home:  Get plenty of rest. Your health care provider may recommend over-the-counter medicines for pain. Take over-the-counter and prescription medicines only as told by your health care provider. If directed, apply heat to swollen lymph glands as often as told by your health care provider. Use the heat source that your health care provider recommends, such as a moist heat pack or a heating pad. Place a towel between your skin and the heat source. Leave the heat on for 20-30 minutes. Remove the heat if your skin turns bright red. This is especially important if you are unable to feel pain, heat, or cold. You may have a greater risk of getting burned. Check your affected lymph glands every day for changes. Check other lymph gland areas as told by your health care provider.  Check for changes such as: More swelling. Sudden increase in size. Redness or pain. Hardness. Keep all follow-up visits. This is important. Contact a health care provider if you have: Lymph glands that: Are still swollen after 2 weeks. Have suddenly gotten bigger or the swelling spreads. Are red, painful, or hard. Fluid leaking from the skin near an enlarged lymph gland. Problems with breathing. A fever, chills, or night sweats. Fatigue. A sore throat. Pain in your abdomen. Weight loss. Get help right away if you have: Severe pain. Chest pain. Shortness of breath. These symptoms may represent a serious problem that is an emergency. Do not wait to see if the symptoms will go away. Get medical help right away. Call your local emergency services (911 in the [place] Do not drive yourself to the hospital. Summary Lymphadenopathy means that your lymph glands are swollen or larger than normal. Lymph glands, also called lymph nodes, are collections of tissue that filter excess fluid, bacteria, viruses, and waste from the bloodstream. They are part of your body's disease-fighting system (immune system). Lymphadenopathy can occur anywhere that you have lymph glands. If the lymph nodes do not go back to normal size after you have an infection or disease, your health care provider may do tests to monitor your condition and find the reason why the glands are still swollen and enlarged. Check your affected lymph glands every day for changes. Check other lymph gland areas as told by your health care provider. This information is not intended to replace advice given to you by your health care provider. Make sure you discuss any questions you have with your health care provider. Document Revised: 01/09/2020 Document Reviewed: 01/09/2020 Elsevier Patient Education  2023 Elsevier Inc.  

## 2021-10-19 NOTE — Assessment & Plan Note (Signed)
Stable con't protonix

## 2021-10-19 NOTE — Assessment & Plan Note (Signed)
Well controlled, no changes to meds. Encouraged heart healthy diet such as the DASH diet and exercise as tolerated.  °

## 2021-10-20 LAB — EPSTEIN-BARR VIRUS VCA ANTIBODY PANEL
EBV NA IgG: >600 U/mL — ABNORMAL HIGH
EBV VCA IgG: >750 U/mL — ABNORMAL HIGH
EBV VCA IgM: 86.7 U/mL — ABNORMAL HIGH

## 2021-10-21 ENCOUNTER — Encounter: Payer: Self-pay | Admitting: Family Medicine

## 2021-10-21 NOTE — Progress Notes (Unsigned)
NEUROLOGY FOLLOW UP OFFICE NOTE  Ruth [name] [id number]  Assessment/Plan:   Migraine without aura, without status migrainosus, not intractable History of cerebral aneurysm rupture status post clipping   1 Check MRA of head to follow up on cerebral aneurysm 2 Follow up in ***   Subjective:  Ruth Patricia. [name] is a 61 year old right handed female with HTN, migraines and history of SAH secondary to cerebral aneurysm rupture s/p clipping who follows up for migraines.   UPDATE: She has had a mild headache from time to time, infrequent.  No headaches which she would classify as a migraine.   Current NSAIDS/analgesics:  Tylenol, rarely ibuprofen Current triptans:  none Current ergotamine:  none Current anti-emetic:  none Current muscle relaxants:  none Current Antihypertensive medications:  amlodipine Current Antidepressant medications:  none Current Anticonvulsant medications:  none Current anti-CGRP:  none Current Vitamins/Herbal/Supplements:  none Current Antihistamines/Decongestants:  Flonase Other therapy:  none Hormone/birth control:  none   Caffeine:  Sweet tea daily.  No coffee or cola Diet:  Drinks water.  Usually wouldn't skip meals but she has since topiramate Exercise:  Not recently Depression: no ; Anxiety:  yes Other pain:  Localized head pain from surgery Sleep hygiene:  Sleeps okay.  Feels tired   HISTORY: She has longstanding history of headaches since young adulthood, which she says have been attributed to allergies and sinus issues. In 2016, she reported different type of headache.  They were right sided, throbbing/pressure and 8/10 intensity.  At that time, they lasted all day and occurred daily.  It is associated with nausea, photophobia, phonophobia and osmophobia.  She also reported seeing floaters but her eye doctor said it was normal for her age.  She also feels a little dizzy.  She would Advil or Tylenol almost daily, with variable effect.  She denies  family history of headache. She then has had episodes of severe dizziness, not associated with headache.  It is not spinning but it is a sense of movement and feeling off-balance.  There is mild nausea but no vomiting.  It occurs spontaneously throughout the day and each episode can last up to 4 hours non-stop.  She noted some bilateral tinnitus.  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  She subsequently established care with another neurologist, Dr. Felecia Shelling, who recommended close follow up.  She was lost to follow up with Dr. Felecia Shelling due to the COVID-19 pandemic until she saw him again in April 2021 and started her on topiramate.  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     Prior to the aneurysm rupture, migraines were infrequent,about once a month, and responded within 1 hour with Tylenol.  Since the surgery, she has had some head on side of the surgery and persistent right ear pressure.  ENT found nothing wrong.  Repeat MRA of head on 03/31/2021 was stable.   Imaging personally reviewed: 03/11/2015 MRI BRAIN W WO:  1.  No acute intracranial abnormality.  2. Mild to moderate for age nonspecific mostly subcortical cerebral white matter signal changes. Solitary small nonspecific focus of T2 hyperintensity in the cerebellum, most resembling a chronic lacune. 04/16/2015 CTA HEAD:  1. 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  2. Otherwise normal CTA circle of Willis without focal stenosis, other aneurysm, or branch vessel occlusion.  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 Mild extent of white matter foci in the subcortical white matter in the left cerebral  hemisphere consistent with mild chronic microvascular ischemic changes. None of these appear to be acute and there is no change when compared to the MRI dated 03/11/2015.  07/21/2019 CT BRAIN WO: 1. There are findings concerning for potential subarachnoid hemorrhage, although this could simply reflect "pseudo subarachnoid hemorrhage" related to interval development of mild hydrocephalus compared to prior study from 2017. Given the patient's documented history of ACA aneurysm, further evaluation with repeat head CTA is recommended at this time. 07/21/2019 CTA HEAD & NECK:  1. 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  Trace subarachnoid hemorrhage including trace IVH visible now. Mild proximal ACA vasospasm. Acute ventriculomegaly, although no definite transependymal edema.  Note also dominant Left ACA A1. 2. Elsewhere stable and negative CTA head and neck. No atherosclerosis or arterial stenosis identified. 03/31/2021 MRA HEAD:  1. Sequelae of prior surgical clipping of anterior communicating artery aneurysm with persistent faint 2 mm remnant. Appearance is stable and not significantly changed as compared to previous.2. Otherwise stable and negative intracranial MRA.     Past NSAIDS/analgesics:  none Past abortive triptans:  none Past abortive ergotamine:  none Past muscle relaxants:  none Past anti-emetic:  Zofran ODT '4mg'$  Past antihypertensive medications:  Metoprolol succinate, losartan, valsartain, HCTZ Past antidepressant medications:  Sertraline '25mg'$ , Wellbutrin Past anticonvulsant medications:  topiramate '50mg'$  QHS Past anti-CGRP:  none Past vitamins/Herbal/Supplements:  none Past antihistamines/decongestants:  none Other past therapies:  none     Family history of headache or aneurysm:  no  PAST MEDICAL HISTORY: Past Medical History:  Diagnosis Date   Allergic rhinitis, cause unspecified     Asthma    Eczema    Food allergy    Gallbladder problem    Headache    Hypertension     MEDICATIONS: Current Outpatient Medications on File Prior to Visit  Medication Sig Dispense Refill   albuterol (VENTOLIN HFA) 108 (90 Base) MCG/ACT inhaler Inhale 2 puffs into the lungs every 6 (six) hours as needed for wheezing or shortness of breath. 18 g 1   amLODipine (NORVASC) 5 MG tablet TAKE 1 TABLET(5 MG) BY MOUTH DAILY 90 tablet 1   cephALEXin (KEFLEX) 500 MG capsule Take 1 capsule (500 mg total) by mouth 2 (two) times daily. 20 capsule 0   clindamycin (CLEOCIN-T) 1 % lotion Apply topically 2 (two) times daily. 60 mL 0   EPINEPHrine 0.3 mg/0.3 mL IJ SOAJ injection Inject into upper thigh for severe allergic reaction 2 each 1   fluticasone (FLONASE) 50 MCG/ACT nasal spray SHAKE LIQUID AND USE 2 SPRAYS IN EACH NOSTRIL EVERY DAY 16 g 2   fluticasone furoate-vilanterol (BREO ELLIPTA) 100-25 MCG/INH AEPB Inhale 1 puff into the lungs daily. 1 each 5   guaiFENesin (MUCINEX) 600 MG 12 hr tablet Take 2 tablets (1,200 mg total) by mouth 2 (two) times daily. 30 tablet 2   pantoprazole (PROTONIX) 40 MG tablet Take 1 tablet (40 mg total) by mouth daily. 90 tablet 3   Vitamin D, Ergocalciferol, (DRISDOL) 1.25 MG (50000 UNIT) CAPS capsule Take 1 capsule (50,000 Units total) by mouth every 7 (seven) days. 12  capsule 2   No current facility-administered medications on file prior to visit.    ALLERGIES: Allergies  Allergen Reactions   Other Anaphylaxis    Nuts    Chlorhexidine Gluconate Itching    FAMILY HISTORY: Family History  Problem Relation Age of Onset   Asthma Mother    Allergies Mother    Asthma Father    Drug abuse Father    Allergies Father       Objective:  *** General: No acute distress.  Patient appears well-groomed.   Head:  Normocephalic/atraumatic Eyes:  Fundi examined but not visualized Neck: supple, no paraspinal tenderness, full range of motion Heart:  Regular rate  and rhythm Neurological Exam: alert and oriented to person, place, and time.  Speech fluent and not dysarthric, language intact.  CN II-XII intact. Bulk and tone normal, muscle strength 5/5 throughout.  Sensation to light touch intact.  Deep tendon reflexes 2+ throughout, toes downgoing.  Finger to nose testing intact.  Gait normal, Romberg negative.   Metta Clines, DO  CC: Roma Schanz, DO

## 2021-10-22 ENCOUNTER — Encounter: Payer: Self-pay | Admitting: Neurology

## 2021-10-22 ENCOUNTER — Ambulatory Visit: Payer: Managed Care, Other (non HMO) | Admitting: Neurology

## 2021-10-22 VITALS — BP 122/82 | HR 89 | Ht 64.5 in | Wt 192.0 lb

## 2021-10-22 DIAGNOSIS — G43009 Migraine without aura, not intractable, without status migrainosus: Secondary | ICD-10-CM | POA: Diagnosis not present

## 2021-10-22 DIAGNOSIS — I671 Cerebral aneurysm, nonruptured: Secondary | ICD-10-CM | POA: Diagnosis not present

## 2021-10-22 NOTE — Patient Instructions (Signed)
Plan to repeat MRA of head in January.  Follow up right afterwards.

## 2021-10-22 NOTE — Addendum Note (Signed)
Addended by: Renae Gloss on: 10/22/2021 09:49 AM   Modules accepted: Orders

## 2021-11-12 ENCOUNTER — Encounter: Payer: Self-pay | Admitting: Family Medicine

## 2021-11-13 ENCOUNTER — Other Ambulatory Visit: Payer: Self-pay | Admitting: Family Medicine

## 2021-11-13 DIAGNOSIS — T753XXA Motion sickness, initial encounter: Secondary | ICD-10-CM

## 2021-11-13 MED ORDER — SCOPOLAMINE 1 MG/3DAYS TD PT72: 1.0000 | MEDICATED_PATCH | TRANSDERMAL | 12 refills | Status: AC

## 2022-01-07 ENCOUNTER — Telehealth (HOSPITAL_BASED_OUTPATIENT_CLINIC_OR_DEPARTMENT_OTHER): Payer: Self-pay

## 2022-01-07 ENCOUNTER — Ambulatory Visit: Payer: Managed Care, Other (non HMO) | Admitting: Family Medicine

## 2022-01-07 ENCOUNTER — Encounter: Payer: Self-pay | Admitting: Family Medicine

## 2022-01-07 VITALS — BP 110/90 | HR 97 | Temp 98.7°F | Resp 18 | Ht 64.5 in | Wt 186.8 lb

## 2022-01-07 DIAGNOSIS — R591 Generalized enlarged lymph nodes: Secondary | ICD-10-CM | POA: Diagnosis not present

## 2022-01-07 LAB — CBC WITH DIFFERENTIAL/PLATELET
Basophils Absolute: 0 10*3/uL (ref 0.0–0.1)
Basophils Relative: 0.9 % (ref 0.0–3.0)
Eosinophils Absolute: 0.1 10*3/uL (ref 0.0–0.7)
Eosinophils Relative: 3 % (ref 0.0–5.0)
HCT: 39.2 % (ref 36.0–46.0)
Hemoglobin: 13 g/dL (ref 12.0–15.0)
Lymphocytes Relative: 20.6 % (ref 12.0–46.0)
Lymphs Abs: 0.9 10*3/uL (ref 0.7–4.0)
MCHC: 33.3 g/dL (ref 30.0–36.0)
MCV: 84.4 fl (ref 78.0–100.0)
Monocytes Absolute: 0.3 10*3/uL (ref 0.1–1.0)
Monocytes Relative: 6.4 % (ref 3.0–12.0)
Neutro Abs: 3.1 10*3/uL (ref 1.4–7.7)
Neutrophils Relative %: 69.1 % (ref 43.0–77.0)
Platelets: 246 10*3/uL (ref 150.0–400.0)
RBC: 4.65 Mil/uL (ref 3.87–5.11)
RDW: 14.1 % (ref 11.5–15.5)
WBC: 4.6 10*3/uL (ref 4.0–10.5)

## 2022-01-07 LAB — SEDIMENTATION RATE: Sed Rate: 17 mm/hr (ref 0–30)

## 2022-01-07 MED ORDER — CEPHALEXIN 500 MG PO CAPS: 500.0000 mg | ORAL_CAPSULE | Freq: Two times a day (BID) | ORAL | 0 refills | Status: DC

## 2022-01-07 NOTE — Patient Instructions (Signed)
Parotitis  Parotitis is inflammation of one or both of the parotid glands. These glands produce saliva. They are found on each side of the face, below and in front of the earlobes. The saliva that they produce comes out of tiny openings (ducts) inside the cheeks. Parotitis may cause sudden swelling and pain (acute parotitis). It can also cause repeated episodes of swelling and pain or continued swelling that may or may not be painful (chronic parotitis). What are the causes? This condition may be caused by: Infections from bacteria. Infections from viruses, such as mumps or HIV. Blockage (obstruction) of saliva flow through the parotid glands. This can be from a stone, scar tissue, or a tumor. Diseases that cause your body's defense system (immune system) to attack healthy cells in your salivary glands. These are called autoimmune diseases. What increases the risk? You are more likely to develop this condition if: You are 50 years old or older. You do not drink enough fluids (are dehydrated). You drink too much alcohol. You have: A dry mouth. Diabetes. Gout. A long-term illness. You do not take good care of your mouth and teeth (poor oral hygiene). You have had radiation treatments to the head and neck. You take certain medicines. What are the signs or symptoms? Symptoms of this condition depend on the cause. Symptoms may include: Swelling under and in front of the ear. This may get worse after eating. Pain and tenderness over the parotid gland. This may get worse after eating. Redness and warmth of the skin over the parotid gland. Fever or chills. Pus coming from the ducts inside the mouth. Dry mouth. A bad taste in the mouth. How is this diagnosed? This condition may be diagnosed based on: Your medical history. A physical exam. Tests to find the cause of the parotitis. These may include: Doing blood tests to check for an autoimmune disease or infections from a virus. Taking a  fluid sample from the parotid gland and testing it for infection. Injecting the ducts of the parotid gland with a dye and then taking X-rays (sialogram). Having other imaging tests of the gland, such as X-rays, ultrasound, MRI, or CT scan. Checking the opening of the gland for a stone or obstruction. Placing a needle into the gland to remove tissue for a biopsy (fine needle aspiration). How is this treated? Treatment for this condition depends on the cause. Treatment may include: Antibiotic medicine for a bacterial infection. NSAIDs, such as ibuprofen, to treat pain and swelling. Drinking more fluids. Removing a stone or obstruction. Treating an underlying disease that is causing parotitis. Surgery to drain an infection, remove a growth, or remove the whole gland (parotidectomy). Treatment may not be needed if parotid swelling goes away with home care. Follow these instructions at home: Medicines  Take over-the-counter and prescription medicines only as told by your health care provider. If you were prescribed an antibiotic medicine, take it as told by your health care provider. Do not stop taking the antibiotic even if you start to feel better. Managing pain and swelling If directed, apply heat to the affected area as often as told by your health care provider. Use the heat source that your health care provider recommends, such as a moist heat pack or a heating pad. Place a towel between your skin and the heat source. Leave the heat on for 20-30 minutes. Remove the heat if your skin turns bright red. This is especially important if you are unable to feel pain, heat, or cold.   You have a greater risk of getting burned. Gargle with a mixture of salt and water 3-4 times a day or as needed. To make salt water, completely dissolve -1 tsp (3-6 g) of salt in 1 cup (237 mL) of warm water. Gently massage the parotid glands as told by your health care provider. General instructions  Drink enough  fluid to keep your urine pale yellow. Keep your mouth clean and moist. Try sucking on sour candy. This may help to make your mouth less dry by stimulating the flow of saliva. Maintain good oral health. Brush your teeth at least two times a day. Floss your teeth every day. See your dentist regularly. Do not use any products that contain nicotine or tobacco. These products include cigarettes, chewing tobacco, and vaping devices, such as e-cigarettes. If you need help quitting, ask your health care provider. Do not drink alcohol. Keep all follow-up visits. This is important. Contact a health care provider if: You have a fever or chills. You have new symptoms. Your symptoms get worse. Your symptoms do not improve with treatment. Get help right away if: You have difficulty breathing or swallowing because of the swollen gland. These symptoms may represent a serious problem that is an emergency. Do not wait to see if the symptoms will go away. Get medical help right away. Call your local emergency services (911 in the [place] Do not drive yourself to the hospital. Summary Parotitis is inflammation of one or both of the parotid glands. Symptoms include pain and swelling under and in front of the ear. They may also include a fever and a bad taste in your mouth. This condition may be treated with antibiotics, NSAIDs, increasing fluids, or surgery. In some cases, parotitis may go away on its own without treatment. You should drink plenty of fluids, maintain good oral health, and do not use products that contain nicotine or tobacco. This information is not intended to replace advice given to you by your health care provider. Make sure you discuss any questions you have with your health care provider. Document Revised: 07/25/2020 Document Reviewed: 07/25/2020 Elsevier Patient Education  2023 [employer]  

## 2022-01-07 NOTE — Assessment & Plan Note (Signed)
Was better after abx but came back Refill keflex Check ct due to b/l enlargement --- ? Parotid gland Check labs Try sour candy  ent if no better

## 2022-01-07 NOTE — Progress Notes (Addendum)
Subjective:   By signing my name below, I, [name], attest that this documentation has been prepared under the direction and in the presence of Ann Held, DO. 01/07/2022    Patient ID: Ruth [name], female    DOB: February 25, 1961, 61 y.o.   MRN: [id number]  Chief Complaint  Patient presents with   Swollen Gland    Pt states having swollen glands since last visit but states it could be her teeth. Pt has scheduled a dentist appt    HPI Patient is in today for a office visit.   She complains of swollen painful glands in her neck since her last visit. She also complains of tooth pain. She feels sluggish but denies having any fevers. She has an upcomming appointment with her dentist in 2 weeks to manage her tooth pain. She was given anti-biotics during her last visit to manage her symptoms and found her swelling and symptoms improved but they shortly returned after she completed her anti-biotic course.  Her blood pressure is doing well during this visit.  BP Readings from Last 3 Encounters:  01/07/22 (!) 110/90  10/22/21 122/82  10/19/21 126/90   Pulse Readings from Last 3 Encounters:  01/07/22 97  10/22/21 89  10/19/21 98    Past Medical History:  Diagnosis Date   Allergic rhinitis, cause unspecified    Asthma    Eczema    Food allergy    Gallbladder problem    Headache    Hypertension     Past Surgical History:  Procedure Laterality Date   CESAREAN SECTION     CESAREAN SECTION Bilateral [id number]    Family History  Problem Relation Age of Onset   Asthma Mother    Allergies Mother    Asthma Father    Drug abuse Father    Allergies Father     Social History   Socioeconomic History   Marital status: Single    Spouse name: Not on file   Number of children: Not on file   Years of education: Not on file   Highest education level: Not on file  Occupational History   Occupation: Juvenile Counsler    Comment: guard   Tobacco Use   Smoking  status: Never   Smokeless tobacco: Never  Substance and Sexual Activity   Alcohol use: Yes    Alcohol/week: 0.0 standard drinks of alcohol    Comment: one glass of wine per week/fim   Drug use: No   Sexual activity: Yes    Partners: Male  Other Topics Concern   Not on file  Social History Narrative   Exercise--no   Right handed   Caffiene1 cup   Lives alone and a two story home   Social Determinants of Health   Financial Resource Strain: Not on file  Food Insecurity: Not on file  Transportation Needs: Not on file  Physical Activity: Not on file  Stress: Not on file  Social Connections: Not on file  Intimate Partner Violence: Not on file    Outpatient Medications Prior to Visit  Medication Sig Dispense Refill   albuterol (VENTOLIN HFA) 108 (90 Base) MCG/ACT inhaler Inhale 2 puffs into the lungs every 6 (six) hours as needed for wheezing or shortness of breath. 18 g 1   amLODipine (NORVASC) 5 MG tablet TAKE 1 TABLET(5 MG) BY MOUTH DAILY 90 tablet 1   clindamycin (CLEOCIN-T) 1 % lotion Apply topically 2 (two) times daily. 60 mL 0   EPINEPHrine  0.3 mg/0.3 mL IJ SOAJ injection Inject into upper thigh for severe allergic reaction 2 each 1   fluticasone (FLONASE) 50 MCG/ACT nasal spray SHAKE LIQUID AND USE 2 SPRAYS IN EACH NOSTRIL EVERY DAY 16 g 2   pantoprazole (PROTONIX) 40 MG tablet Take 1 tablet (40 mg total) by mouth daily. 90 tablet 3   scopolamine (TRANSDERM-SCOP) 1 MG/3DAYS Place 1 patch (1.5 mg total) onto the skin every 3 (three) days. 10 patch 12   Vitamin D, Ergocalciferol, (DRISDOL) 1.25 MG (50000 UNIT) CAPS capsule Take 1 capsule (50,000 Units total) by mouth every 7 (seven) days. 12 capsule 2   cephALEXin (KEFLEX) 500 MG capsule Take 1 capsule (500 mg total) by mouth 2 (two) times daily. 20 capsule 0   fluticasone furoate-vilanterol (BREO ELLIPTA) 100-25 MCG/INH AEPB Inhale 1 puff into the lungs daily. (Patient not taking: Reported on 10/22/2021) 1 each 5   guaiFENesin  (MUCINEX) 600 MG 12 hr tablet Take 2 tablets (1,200 mg total) by mouth 2 (two) times daily. (Patient not taking: Reported on 10/22/2021) 30 tablet 2   No facility-administered medications prior to visit.    Allergies  Allergen Reactions   Other Anaphylaxis    Nuts    Chlorhexidine Gluconate Itching    Review of Systems  Constitutional:  Positive for malaise/fatigue. Negative for fever.  HENT:  Negative for congestion, ear discharge, ear pain and sore throat.   Eyes:  Negative for blurred vision.  Respiratory:  Negative for cough, sputum production, shortness of breath and stridor.   Cardiovascular:  Negative for chest pain, palpitations and leg swelling.  Gastrointestinal:  Negative for abdominal pain, blood in stool and nausea.  Genitourinary:  Negative for dysuria and frequency.  Musculoskeletal:  Negative for falls.  Skin:  Negative for rash.  Neurological:  Negative for dizziness, loss of consciousness and headaches.  Endo/Heme/Allergies:  Negative for environmental allergies.  Psychiatric/Behavioral:  Negative for depression. The patient is not nervous/anxious.        Objective:    Physical Exam Vitals and nursing note reviewed.  Constitutional:      General: She is not in acute distress.    Appearance: Normal appearance. She is not ill-appearing.  HENT:     Head: Normocephalic and atraumatic.     Right Ear: External ear normal.     Left Ear: External ear normal.  Eyes:     Extraocular Movements: Extraocular movements intact.     Pupils: Pupils are equal, round, and reactive to light.  Neck:     Comments: ? Enlarged parotid gland b/l -- tender  Cardiovascular:     Rate and Rhythm: Normal rate and regular rhythm.     Heart sounds: Normal heart sounds. No murmur heard.    No gallop.  Pulmonary:     Effort: Pulmonary effort is normal. No respiratory distress.     Breath sounds: Normal breath sounds. No wheezing or rales.  Musculoskeletal:     Cervical back:  Tenderness present.  Lymphadenopathy:     Cervical: Cervical adenopathy present.  Skin:    General: Skin is warm and dry.  Neurological:     Mental Status: She is alert and oriented to person, place, and time.  Psychiatric:        Judgment: Judgment normal.     BP (!) 110/90 (BP Location: Left Arm, Patient Position: Sitting, Cuff Size: Large)   Pulse 97   Temp 98.7 F (37.1 C) (Oral)   Resp 18   Ht  5' 4.5" (1.638 m)   Wt 186 lb 12.8 oz (84.7 kg)   LMP 09/19/2015   SpO2 98%   BMI 31.57 kg/m  Wt Readings from Last 3 Encounters:  01/07/22 186 lb 12.8 oz (84.7 kg)  10/22/21 192 lb (87.1 kg)  10/19/21 191 lb 6.4 oz (86.8 kg)    Diabetic Foot Exam - Simple   No data filed    Lab Results  Component Value Date   WBC 4.6 01/07/2022   HGB 13.0 01/07/2022   HCT 39.2 01/07/2022   PLT 246.0 01/07/2022   GLUCOSE 88 03/31/2021   CHOL 184 03/31/2021   TRIG 83.0 03/31/2021   HDL 61.20 03/31/2021   LDLCALC 106 (H) 03/31/2021   ALT 24 03/31/2021   AST 19 03/31/2021   NA 139 03/31/2021   K 3.9 03/31/2021   CL 104 03/31/2021   CREATININE 0.84 03/31/2021   BUN 14 03/31/2021   CO2 28 03/31/2021   TSH 1.19 03/31/2021   HGBA1C 5.6 03/05/2019   MICROALBUR 1.1 07/03/2012    Lab Results  Component Value Date   TSH 1.19 03/31/2021   Lab Results  Component Value Date   WBC 4.6 01/07/2022   HGB 13.0 01/07/2022   HCT 39.2 01/07/2022   MCV 84.4 01/07/2022   PLT 246.0 01/07/2022   Lab Results  Component Value Date   NA 139 03/31/2021   K 3.9 03/31/2021   CO2 28 03/31/2021   GLUCOSE 88 03/31/2021   BUN 14 03/31/2021   CREATININE 0.84 03/31/2021   BILITOT 0.4 03/31/2021   ALKPHOS 177 (H) 03/31/2021   AST 19 03/31/2021   ALT 24 03/31/2021   PROT 7.6 03/31/2021   ALBUMIN 4.1 03/31/2021   CALCIUM 9.1 03/31/2021   ANIONGAP 11 07/21/2019   GFR 76.80 03/31/2021   Lab Results  Component Value Date   CHOL 184 03/31/2021   Lab Results  Component Value Date   HDL 61.20  03/31/2021   Lab Results  Component Value Date   LDLCALC 106 (H) 03/31/2021   Lab Results  Component Value Date   TRIG 83.0 03/31/2021   Lab Results  Component Value Date   CHOLHDL 3 03/31/2021   Lab Results  Component Value Date   HGBA1C 5.6 03/05/2019       Assessment & Plan:   Problem List Items Addressed This Visit       Unprioritized   Lymphadenopathy - Primary    Was better after abx but came back Refill keflex Check ct due to b/l enlargement --- ? Parotid gland Check labs Try sour candy  ent if no better      Relevant Medications   cephALEXin (KEFLEX) 500 MG capsule   Other Relevant Orders   CBC with Differential/Platelet (Completed)   CT SOFT TISSUE NECK W CONTRAST   Mumps Antibody, IgM   Antinuclear Antib (ANA)   Sedimentation rate (Completed)     Meds ordered this encounter  Medications   cephALEXin (KEFLEX) 500 MG capsule    Sig: Take 1 capsule (500 mg total) by mouth 2 (two) times daily.    Dispense:  20 capsule    Refill:  0    I, Ann Held, DO, personally preformed the services described in this documentation.  All medical record entries made by the scribe were at my direction and in my presence.  I have reviewed the chart and discharge instructions (if applicable) and agree that the record reflects my personal performance and is  accurate and complete. 01/07/2022   I,[name],acting as a scribe for Ann Held, DO.,have documented all relevant documentation on the behalf of Ann Held, DO,as directed by  Ann Held, DO while in the presence of Ann Held, DO.   Ann Held, DO

## 2022-01-10 ENCOUNTER — Other Ambulatory Visit: Payer: Self-pay | Admitting: Family

## 2022-01-10 DIAGNOSIS — R768 Other specified abnormal immunological findings in serum: Secondary | ICD-10-CM

## 2022-01-12 LAB — MUMPS ANTIBODY, IGM: Mumps IgM Value: <1:20 {titer}

## 2022-01-12 LAB — ANTI-NUCLEAR AB-TITER (ANA TITER): ANA Titer 1: 1:40 {titer} — ABNORMAL HIGH

## 2022-01-12 LAB — ANA: Anti Nuclear Antibody (ANA): POSITIVE — AB

## 2022-01-19 ENCOUNTER — Ambulatory Visit (INDEPENDENT_AMBULATORY_CARE_PROVIDER_SITE_OTHER): Payer: Managed Care, Other (non HMO) | Admitting: Family Medicine

## 2022-01-19 ENCOUNTER — Encounter: Payer: Self-pay | Admitting: Family Medicine

## 2022-01-19 VITALS — BP 120/90 | HR 111 | Temp 98.5°F | Resp 16 | Ht 64.5 in | Wt 186.8 lb

## 2022-01-19 DIAGNOSIS — J45909 Unspecified asthma, uncomplicated: Secondary | ICD-10-CM

## 2022-01-19 DIAGNOSIS — J4541 Moderate persistent asthma with (acute) exacerbation: Secondary | ICD-10-CM

## 2022-01-19 DIAGNOSIS — Z0000 Encounter for general adult medical examination without abnormal findings: Secondary | ICD-10-CM | POA: Diagnosis not present

## 2022-01-19 DIAGNOSIS — I10 Essential (primary) hypertension: Secondary | ICD-10-CM | POA: Diagnosis not present

## 2022-01-19 DIAGNOSIS — J302 Other seasonal allergic rhinitis: Secondary | ICD-10-CM

## 2022-01-19 MED ORDER — FLUTICASONE FUROATE-VILANTEROL 100-25 MCG/ACT IN AEPB: 1.0000 | INHALATION_SPRAY | Freq: Every day | RESPIRATORY_TRACT | 11 refills | Status: DC

## 2022-01-19 MED ORDER — ALBUTEROL SULFATE HFA 108 (90 BASE) MCG/ACT IN AERS: 2.0000 | INHALATION_SPRAY | Freq: Four times a day (QID) | RESPIRATORY_TRACT | 1 refills | Status: DC | PRN

## 2022-01-19 MED ORDER — AZELASTINE HCL 0.1 % NA SOLN: 1.0000 | Freq: Two times a day (BID) | NASAL | 12 refills | Status: DC

## 2022-01-19 NOTE — Patient Instructions (Signed)
Preventive Care 40-59 Years Old, Female Preventive care refers to lifestyle choices and visits with your health care provider that can promote health and wellness. Preventive care visits are also called wellness exams. What can I expect for my preventive care visit? Counseling Your health care provider may ask you questions about your: Medical history, including: Past medical problems. Family medical history. Pregnancy history. Current health, including: Menstrual cycle. Method of birth control. Emotional well-being. Home life and relationship well-being. Sexual activity and sexual health. Lifestyle, including: Alcohol, nicotine or tobacco, and drug use. Access to firearms. Diet, exercise, and sleep habits. Work and work environment. Sunscreen use. Safety issues such as seatbelt and bike helmet use. Physical exam Your health care provider will check your: Height and weight. These may be used to calculate your BMI (body mass index). BMI is a measurement that tells if you are at a healthy weight. Waist circumference. This measures the distance around your waistline. This measurement also tells if you are at a healthy weight and may help predict your risk of certain diseases, such as type 2 diabetes and high blood pressure. Heart rate and blood pressure. Body temperature. Skin for abnormal spots. What immunizations do I need?  Vaccines are usually given at various ages, according to a schedule. Your health care provider will recommend vaccines for you based on your age, medical history, and lifestyle or other factors, such as travel or where you work. What tests do I need? Screening Your health care provider may recommend screening tests for certain conditions. This may include: Lipid and cholesterol levels. Diabetes screening. This is done by checking your blood sugar (glucose) after you have not eaten for a while (fasting). Pelvic exam and Pap test. Hepatitis B test. Hepatitis C  test. HIV (human immunodeficiency virus) test. STI (sexually transmitted infection) testing, if you are at risk. Lung cancer screening. Colorectal cancer screening. Mammogram. Talk with your health care provider about when you should start having regular mammograms. This may depend on whether you have a family history of breast cancer. BRCA-related cancer screening. This may be done if you have a family history of breast, ovarian, tubal, or peritoneal cancers. Bone density scan. This is done to screen for osteoporosis. Talk with your health care provider about your test results, treatment options, and if necessary, the need for more tests. Follow these instructions at home: Eating and drinking  Eat a diet that includes fresh fruits and vegetables, whole grains, lean protein, and low-fat dairy products. Take vitamin and mineral supplements as recommended by your health care provider. Do not drink alcohol if: Your health care provider tells you not to drink. You are pregnant, may be pregnant, or are planning to become pregnant. If you drink alcohol: Limit how much you have to 0-1 drink a day. Know how much alcohol is in your drink. In the [place] one drink equals one 12 oz bottle of beer (355 mL), one 5 oz glass of wine (148 mL), or one 1 oz glass of hard liquor (44 mL). Lifestyle Brush your teeth every morning and night with fluoride toothpaste. Floss one time each day. Exercise for at least 30 minutes 5 or more days each week. Do not use any products that contain nicotine or tobacco. These products include cigarettes, chewing tobacco, and vaping devices, such as e-cigarettes. If you need help quitting, ask your health care provider. Do not use drugs. If you are sexually active, practice safe sex. Use a condom or other form of protection to   prevent STIs. If you do not wish to become pregnant, use a form of birth control. If you plan to become pregnant, see your health care provider for a  prepregnancy visit. Take aspirin only as told by your health care provider. Make sure that you understand how much to take and what form to take. Work with your health care provider to find out whether it is safe and beneficial for you to take aspirin daily. Find healthy ways to manage stress, such as: Meditation, yoga, or listening to music. Journaling. Talking to a trusted person. Spending time with friends and family. Minimize exposure to UV radiation to reduce your risk of skin cancer. Safety Always wear your seat belt while driving or riding in a vehicle. Do not drive: If you have been drinking alcohol. Do not ride with someone who has been drinking. When you are tired or distracted. While texting. If you have been using any mind-altering substances or drugs. Wear a helmet and other protective equipment during sports activities. If you have firearms in your house, make sure you follow all gun safety procedures. Seek help if you have been physically or sexually abused. What's next? Visit your health care provider once a year for an annual wellness visit. Ask your health care provider how often you should have your eyes and teeth checked. Stay up to date on all vaccines. This information is not intended to replace advice given to you by your health care provider. Make sure you discuss any questions you have with your health care provider. Document Revised: 09/10/2020 Document Reviewed: 09/10/2020 Elsevier Patient Education  [employer].

## 2022-01-19 NOTE — Assessment & Plan Note (Signed)
con't breo and albuterol

## 2022-01-19 NOTE — Assessment & Plan Note (Signed)
Well controlled, no changes to meds. Encouraged heart healthy diet such as the DASH diet and exercise as tolerated.  °

## 2022-01-19 NOTE — Assessment & Plan Note (Signed)
ghm utd Check labs  See avs  

## 2022-01-19 NOTE — Progress Notes (Signed)
Subjective:   By signing my name below, I, Roma Schanz, attest that this documentation has been prepared under the direction and in the presence of Roma Schanz, 01/19/2022.   Patient ID: Ruth [name], female    DOB: [birth year], 61 y.o.   MRN: [id number]  Chief Complaint  Patient presents with   Annual Exam    Pt states fasting     HPI Patient is in today for a comprehensive physical exam.  She denies new moles, itching, chills, fever, hearing loss, sinus pain, congestion, sore throat, cough and hemoptysis, chest pain, palpitations, wheezing, constipation, diarrhea, blood in stool, nausea and vomiting, dysuria, frequency, hematuria, myalgias and joint pain, depression, anxiety.  Asthma Patient is reporting asthma flare, but is due to the changing seasons for the past week. She experiences post nasal drip which is managed with humidifier, Zyrtec and Claritin; Claritin and Zyrtec do not help. Patient reports that she has not used her [facility] and uses her Albuterol inhaler instead.   Cologuard last completed on 04/04/2017. Immunizations- She is not interested in receiving an influenza or shingles vaccine this visit. Pap smear last completed on 06/26/2020. Mammogram last completed on 07/02/2021. Vision - She is UTD on vision care. Dental - She is UTD on dental care.   Health Maintenance Due  Topic Date Due   COVID-19 Vaccine (3 - Pfizer risk series) 12/07/2019    Past Medical History:  Diagnosis Date   Allergic rhinitis, cause unspecified    Asthma    Eczema    Food allergy    Gallbladder problem    Headache    Hypertension     Past Surgical History:  Procedure Laterality Date   CESAREAN SECTION     CESAREAN SECTION Bilateral [id number]    Family History  Problem Relation Age of Onset   Asthma Mother    Allergies Mother    Asthma Father    Drug abuse Father    Allergies Father     Social History   Socioeconomic History   Marital status:  Single    Spouse name: Not on file   Number of children: Not on file   Years of education: Not on file   Highest education level: Not on file  Occupational History   Occupation: Juvenile Counsler    Comment: guard   Tobacco Use   Smoking status: Never   Smokeless tobacco: Never  Substance and Sexual Activity   Alcohol use: Yes    Alcohol/week: 0.0 standard drinks of alcohol    Comment: one glass of wine per week/fim   Drug use: No   Sexual activity: Yes    Partners: Male  Other Topics Concern   Not on file  Social History Narrative   Exercise--no   Right handed   Caffiene1 cup   Lives alone and a two story home   Social Determinants of Health   Financial Resource Strain: Not on file  Food Insecurity: Not on file  Transportation Needs: Not on file  Physical Activity: Not on file  Stress: Not on file  Social Connections: Not on file  Intimate Partner Violence: Not on file    Outpatient Medications Prior to Visit  Medication Sig Dispense Refill   amLODipine (NORVASC) 5 MG tablet TAKE 1 TABLET(5 MG) BY MOUTH DAILY 90 tablet 1   clindamycin (CLEOCIN-T) 1 % lotion Apply topically 2 (two) times daily. 60 mL 0   EPINEPHrine 0.3 mg/0.3 mL IJ SOAJ injection Inject into  upper thigh for severe allergic reaction 2 each 1   fluticasone (FLONASE) 50 MCG/ACT nasal spray SHAKE LIQUID AND USE 2 SPRAYS IN EACH NOSTRIL EVERY DAY 16 g 2   pantoprazole (PROTONIX) 40 MG tablet Take 1 tablet (40 mg total) by mouth daily. 90 tablet 3   scopolamine (TRANSDERM-SCOP) 1 MG/3DAYS Place 1 patch (1.5 mg total) onto the skin every 3 (three) days. 10 patch 12   Vitamin D, Ergocalciferol, (DRISDOL) 1.25 MG (50000 UNIT) CAPS capsule Take 1 capsule (50,000 Units total) by mouth every 7 (seven) days. 12 capsule 2   albuterol (VENTOLIN HFA) 108 (90 Base) MCG/ACT inhaler Inhale 2 puffs into the lungs every 6 (six) hours as needed for wheezing or shortness of breath. 18 g 1   guaiFENesin (MUCINEX) 600 MG 12 hr  tablet Take 2 tablets (1,200 mg total) by mouth 2 (two) times daily. (Patient not taking: Reported on 10/22/2021) 30 tablet 2   cephALEXin (KEFLEX) 500 MG capsule Take 1 capsule (500 mg total) by mouth 2 (two) times daily. (Patient not taking: Reported on 01/19/2022) 20 capsule 0   fluticasone furoate-vilanterol (BREO ELLIPTA) 100-25 MCG/INH AEPB Inhale 1 puff into the lungs daily. (Patient not taking: Reported on 10/22/2021) 1 each 5   No facility-administered medications prior to visit.    Allergies  Allergen Reactions   Other Anaphylaxis    Nuts    Chlorhexidine Gluconate Itching    Review of Systems  Constitutional:  Negative for chills, fever and malaise/fatigue.  HENT:  Negative for congestion, sinus pain and sore throat.   Eyes:  Negative for blurred vision.  Respiratory:  Negative for cough, hemoptysis, shortness of breath and wheezing.   Cardiovascular:  Negative for chest pain, palpitations and leg swelling.  Gastrointestinal:  Negative for abdominal pain, blood in stool, constipation, diarrhea, nausea and vomiting.  Genitourinary:  Negative for dysuria, frequency and hematuria.  Musculoskeletal:  Negative for falls, joint pain and myalgias.  Skin:  Negative for itching and rash.       (-) new moles  Neurological:  Negative for dizziness, loss of consciousness and headaches.  Endo/Heme/Allergies:  Negative for environmental allergies.  Psychiatric/Behavioral:  Negative for depression. The patient is not nervous/anxious.        Objective:    Physical Exam Vitals and nursing note reviewed.  Constitutional:      General: She is not in acute distress.    Appearance: Normal appearance. She is not ill-appearing.  HENT:     Head: Normocephalic and atraumatic.     Right Ear: Tympanic membrane, ear canal and external ear normal.     Left Ear: Tympanic membrane, ear canal and external ear normal.  Eyes:     Extraocular Movements: Extraocular movements intact.     Pupils:  Pupils are equal, round, and reactive to light.  Cardiovascular:     Rate and Rhythm: Normal rate and regular rhythm.     Heart sounds: Normal heart sounds. No murmur heard.    No gallop.  Pulmonary:     Effort: Pulmonary effort is normal. No respiratory distress.     Breath sounds: Normal breath sounds. No wheezing or rales.  Abdominal:     General: Bowel sounds are normal. There is no distension.     Palpations: Abdomen is soft.     Tenderness: There is no abdominal tenderness. There is no guarding.  Skin:    General: Skin is warm and dry.  Neurological:     Mental Status:  She is alert and oriented to person, place, and time.  Psychiatric:        Judgment: Judgment normal.     BP (!) 120/90 (BP Location: Left Arm, Patient Position: Sitting, Cuff Size: Large)   Pulse (!) 111   Temp 98.5 F (36.9 C) (Oral)   Resp 16   Ht 5' 4.5" (1.638 m)   Wt 186 lb 12.8 oz (84.7 kg)   LMP 09/19/2015   SpO2 98%   BMI 31.57 kg/m  Wt Readings from Last 3 Encounters:  01/19/22 186 lb 12.8 oz (84.7 kg)  01/07/22 186 lb 12.8 oz (84.7 kg)  10/22/21 192 lb (87.1 kg)       Assessment & Plan:   Problem List Items Addressed This Visit       Unprioritized   Essential hypertension   Relevant Orders   CBC with Differential/Platelet   Comprehensive metabolic panel   Lipid panel   TSH   Preventative health care - Primary    ghm utd Check labs  See avs       Relevant Orders   CBC with Differential/Platelet   Comprehensive metabolic panel   Lipid panel   TSH   Moderate persistent asthma with acute exacerbation    con't breo and albuterol       Relevant Medications   albuterol (VENTOLIN HFA) 108 (90 Base) MCG/ACT inhaler   fluticasone furoate-vilanterol (BREO ELLIPTA) 100-25 MCG/ACT AEPB   HTN (hypertension)    Well controlled, no changes to meds. Encouraged heart healthy diet such as the DASH diet and exercise as tolerated.       Other Visit Diagnoses     Moderate asthma,  unspecified whether complicated, unspecified whether persistent       Relevant Medications   albuterol (VENTOLIN HFA) 108 (90 Base) MCG/ACT inhaler   fluticasone furoate-vilanterol (BREO ELLIPTA) 100-25 MCG/ACT AEPB   Seasonal allergies       Relevant Medications   azelastine (ASTELIN) 0.1 % nasal spray      Meds ordered this encounter  Medications   albuterol (VENTOLIN HFA) 108 (90 Base) MCG/ACT inhaler    Sig: Inhale 2 puffs into the lungs every 6 (six) hours as needed for wheezing or shortness of breath.    Dispense:  18 g    Refill:  1   fluticasone furoate-vilanterol (BREO ELLIPTA) 100-25 MCG/ACT AEPB    Sig: Inhale 1 puff into the lungs daily.    Dispense:  1 each    Refill:  11   azelastine (ASTELIN) 0.1 % nasal spray    Sig: Place 1 spray into both nostrils 2 (two) times daily. Use in each nostril as directed    Dispense:  30 mL    Refill:  12    I, Roma Schanz, personally preformed the services described in this documentation.  All medical record entries made by the scribe were at my direction and in my presence.  I have reviewed the chart and discharge instructions (if applicable) and agree that the record reflects my personal performance and is accurate and complete. 01/19/2022.   I,Verona Buck,acting as a Education administrator for [employer], DO.,have documented all relevant documentation on the behalf of Ann Held, DO,as directed by  Ann Held, DO while in the presence of Ann Held, DO.     Ann Held, DO

## 2022-01-20 LAB — CBC WITH DIFFERENTIAL/PLATELET
Basophils Absolute: 0.1 10*3/uL (ref 0.0–0.1)
Basophils Relative: 1.2 % (ref 0.0–3.0)
Eosinophils Absolute: 0.3 10*3/uL (ref 0.0–0.7)
Eosinophils Relative: 6 % — ABNORMAL HIGH (ref 0.0–5.0)
HCT: 41 % (ref 36.0–46.0)
Hemoglobin: 13.5 g/dL (ref 12.0–15.0)
Lymphocytes Relative: 29.2 % (ref 12.0–46.0)
Lymphs Abs: 1.6 10*3/uL (ref 0.7–4.0)
MCHC: 32.9 g/dL (ref 30.0–36.0)
MCV: 85.1 fl (ref 78.0–100.0)
Monocytes Absolute: 0.3 10*3/uL (ref 0.1–1.0)
Monocytes Relative: 6 % (ref 3.0–12.0)
Neutro Abs: 3.1 10*3/uL (ref 1.4–7.7)
Neutrophils Relative %: 57.6 % (ref 43.0–77.0)
Platelets: 249 10*3/uL (ref 150.0–400.0)
RBC: 4.82 Mil/uL (ref 3.87–5.11)
RDW: 14.3 % (ref 11.5–15.5)
WBC: 5.4 10*3/uL (ref 4.0–10.5)

## 2022-01-20 LAB — TSH: TSH: 0.96 u[IU]/mL (ref 0.35–5.50)

## 2022-01-20 LAB — LIPID PANEL
Cholesterol: 156 mg/dL (ref 0–200)
HDL: 63.1 mg/dL (ref >39.00)
LDL Cholesterol: 81 mg/dL (ref 0–99)
NonHDL: 93.35
Total CHOL/HDL Ratio: 2
Triglycerides: 60 mg/dL (ref 0.0–149.0)
VLDL: 12 mg/dL (ref 0.0–40.0)

## 2022-01-20 LAB — COMPREHENSIVE METABOLIC PANEL
ALT: 37 U/L — ABNORMAL HIGH (ref 0–35)
AST: 38 U/L — ABNORMAL HIGH (ref 0–37)
Albumin: 4.1 g/dL (ref 3.5–5.2)
Alkaline Phosphatase: 299 U/L — ABNORMAL HIGH (ref 39–117)
BUN: 12 mg/dL (ref 6–23)
CO2: 29 mEq/L (ref 19–32)
Calcium: 10 mg/dL (ref 8.4–10.5)
Chloride: 101 mEq/L (ref 96–112)
Creatinine, Ser: 0.92 mg/dL (ref 0.40–1.20)
GFR: 68.46 mL/min (ref >60.00)
Glucose, Bld: 81 mg/dL (ref 70–99)
Potassium: 3.6 mEq/L (ref 3.5–5.1)
Sodium: 138 mEq/L (ref 135–145)
Total Bilirubin: 0.5 mg/dL (ref 0.2–1.2)
Total Protein: 8.4 g/dL — ABNORMAL HIGH (ref 6.0–8.3)

## 2022-01-21 ENCOUNTER — Other Ambulatory Visit: Payer: Self-pay | Admitting: Family Medicine

## 2022-01-21 DIAGNOSIS — R748 Abnormal levels of other serum enzymes: Secondary | ICD-10-CM

## 2022-01-21 DIAGNOSIS — I10 Essential (primary) hypertension: Secondary | ICD-10-CM

## 2022-01-22 ENCOUNTER — Other Ambulatory Visit: Payer: Managed Care, Other (non HMO)

## 2022-01-26 ENCOUNTER — Other Ambulatory Visit: Payer: Managed Care, Other (non HMO)

## 2022-02-05 ENCOUNTER — Ambulatory Visit (INDEPENDENT_AMBULATORY_CARE_PROVIDER_SITE_OTHER): Payer: Managed Care, Other (non HMO)

## 2022-02-05 DIAGNOSIS — R222 Localized swelling, mass and lump, trunk: Secondary | ICD-10-CM

## 2022-02-05 DIAGNOSIS — R591 Generalized enlarged lymph nodes: Secondary | ICD-10-CM | POA: Diagnosis not present

## 2022-02-05 MED ORDER — IOHEXOL 300 MG/ML  SOLN
100.0000 mL | Freq: Once | INTRAMUSCULAR | Status: AC | PRN
  Administered 2022-02-05: 75 mL via INTRAVENOUS

## 2022-02-09 ENCOUNTER — Other Ambulatory Visit: Payer: Self-pay | Admitting: Family Medicine

## 2022-02-09 DIAGNOSIS — C8591 Non-Hodgkin lymphoma, unspecified, lymph nodes of head, face, and neck: Secondary | ICD-10-CM

## 2022-02-09 MED ORDER — CEPHALEXIN 500 MG PO CAPS: 500.0000 mg | ORAL_CAPSULE | Freq: Two times a day (BID) | ORAL | 0 refills | Status: DC

## 2022-02-12 ENCOUNTER — Other Ambulatory Visit: Payer: Self-pay | Admitting: Family Medicine

## 2022-02-12 ENCOUNTER — Encounter: Payer: Self-pay | Admitting: *Deleted

## 2022-02-12 DIAGNOSIS — I10 Essential (primary) hypertension: Secondary | ICD-10-CM

## 2022-02-12 NOTE — Progress Notes (Signed)
Reached out to [facility] to introduce myself as the office RN Navigator and explain our new patient process. Reviewed the reason for their referral and scheduled their new patient appointment along with labs. Provided address and directions to the office including call back phone number. Reviewed with patient any concerns they may have or any possible barriers to attending their appointment.   Informed patient about my role as a navigator and that I will meet with them prior to their New Patient appointment and more fully discuss what services I can provide. At this time patient has no further questions or needs.    Oncology Nurse Navigator Documentation     02/12/2022   10:15 AM  Oncology Nurse Navigator Flowsheets  Abnormal Finding Date 02/08/2022  Diagnosis Status Additional Work Up  Navigator Follow Up Date: 02/16/2022  Navigator Follow Up Reason: New Patient Appointment  Navigator Location [facility]  Navigator Encounter Type Introductory Phone Call  Patient Visit Type MedOnc  Treatment Phase Abnormal Scans  Barriers/Navigation Needs Coordination of Care;Education  Education Other  Interventions Coordination of Care;Education  Acuity Level 2-Minimal Needs (1-2 Barriers Identified)  Coordination of Care Appts  Education Method Verbal  Time Spent with Patient 30

## 2022-02-16 ENCOUNTER — Other Ambulatory Visit: Payer: Self-pay

## 2022-02-16 ENCOUNTER — Encounter: Payer: Self-pay | Admitting: *Deleted

## 2022-02-16 ENCOUNTER — Inpatient Hospital Stay: Payer: Managed Care, Other (non HMO) | Attending: Hematology & Oncology

## 2022-02-16 ENCOUNTER — Encounter: Payer: Self-pay | Admitting: Hematology & Oncology

## 2022-02-16 ENCOUNTER — Inpatient Hospital Stay: Payer: Managed Care, Other (non HMO) | Admitting: Hematology & Oncology

## 2022-02-16 VITALS — BP 141/47 | HR 90 | Temp 98.2°F | Resp 18 | Ht 64.0 in | Wt 186.0 lb

## 2022-02-16 DIAGNOSIS — R590 Localized enlarged lymph nodes: Secondary | ICD-10-CM | POA: Diagnosis not present

## 2022-02-16 DIAGNOSIS — R591 Generalized enlarged lymph nodes: Secondary | ICD-10-CM

## 2022-02-16 LAB — CMP (CANCER CENTER ONLY)
ALT: 28 U/L (ref 0–44)
AST: 36 U/L (ref 15–41)
Albumin: 4 g/dL (ref 3.5–5.0)
Alkaline Phosphatase: 221 U/L — ABNORMAL HIGH (ref 38–126)
Anion gap: 11 (ref 5–15)
BUN: 13 mg/dL (ref 6–20)
CO2: 23 mmol/L (ref 22–32)
Calcium: 9.4 mg/dL (ref 8.9–10.3)
Chloride: 104 mmol/L (ref 98–111)
Creatinine: 0.93 mg/dL (ref 0.44–1.00)
GFR, Estimated: >60 mL/min (ref >60)
Glucose, Bld: 91 mg/dL (ref 70–99)
Potassium: 3.8 mmol/L (ref 3.5–5.1)
Sodium: 138 mmol/L (ref 135–145)
Total Bilirubin: 0.8 mg/dL (ref 0.3–1.2)
Total Protein: 8.7 g/dL — ABNORMAL HIGH (ref 6.5–8.1)

## 2022-02-16 LAB — CBC WITH DIFFERENTIAL (CANCER CENTER ONLY)
Abs Immature Granulocytes: 0.01 10*3/uL (ref 0.00–0.07)
Basophils Absolute: 0 10*3/uL (ref 0.0–0.1)
Basophils Relative: 1 %
Eosinophils Absolute: 0.1 10*3/uL (ref 0.0–0.5)
Eosinophils Relative: 1 %
HCT: 41.2 % (ref 36.0–46.0)
Hemoglobin: 13.1 g/dL (ref 12.0–15.0)
Immature Granulocytes: 0 %
Lymphocytes Relative: 33 %
Lymphs Abs: 1.7 10*3/uL (ref 0.7–4.0)
MCH: 27.5 pg (ref 26.0–34.0)
MCHC: 31.8 g/dL (ref 30.0–36.0)
MCV: 86.4 fL (ref 80.0–100.0)
Monocytes Absolute: 0.4 10*3/uL (ref 0.1–1.0)
Monocytes Relative: 8 %
Neutro Abs: 2.9 10*3/uL (ref 1.7–7.7)
Neutrophils Relative %: 57 %
Platelet Count: 240 10*3/uL (ref 150–400)
RBC: 4.77 MIL/uL (ref 3.87–5.11)
RDW: 13.9 % (ref 11.5–15.5)
WBC Count: 5.1 10*3/uL (ref 4.0–10.5)
nRBC: 0 % (ref 0.0–0.2)

## 2022-02-16 LAB — HEPATITIS PANEL, ACUTE
HCV Ab: NONREACTIVE
Hep A IgM: NONREACTIVE
Hep B C IgM: NONREACTIVE
Hepatitis B Surface Ag: NONREACTIVE

## 2022-02-16 LAB — LACTATE DEHYDROGENASE: LDH: 163 U/L (ref 98–192)

## 2022-02-16 NOTE — Progress Notes (Signed)
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  Chief Complaint  Patient presents with   New Patient (Initial Visit)  : I have swollen lymph nodes my neck.  HPI: Ruth [name] is a very charming 61 year old Afro-American female.  She is recently from [place].  She comes in with her ex-husband.  I give him a total credit for being with her.  She has been in good health.  She does have gallstones.  However, they are not ready to be taken out.  She has noticed for the past several weeks some swollen lymph nodes in the neck.  They are not painful.  She had no problems swallowing.  There is no hoarseness.  Of note, she does not smoke.  She really does not drink alcohol.  She saw her family doctor-Dr. Carollee Herter.  To no surprise, Dr. Carollee Herter was very thorough with her work-up.  A CT scan of the neck was done.  This done on 02/05/2022.  Surprising, this showed widespread lymphadenopathy throughout all cervical lymph node stations.  She was also noted to have some bilateral axillary adenopathy.  Based on this, Dr. Carollee Herter kindly referred Ruth [name] to the [facility] for evaluation.  There is been no fever.  She has had no weight loss or weight gain.  She has had no swollen lymph nodes elsewhere.  Her last mammogram was done back in April.  She has had no change in bowel or bladder habits.  She really has had no past surgery.  She has had no bleeding.  She has had no leg swelling.  She has had a good appetite.  She is still working.  She is a Education officer, museum.  Overall, I would say performance status is probably ECOG 0.    Past Medical History:  Diagnosis Date   Allergic rhinitis, cause unspecified    Asthma    Eczema    Food allergy    Gallbladder problem    Headache    Hypertension   :   Past Surgical History:  Procedure Laterality Date   CESAREAN SECTION     CESAREAN SECTION Bilateral [id number]   :   Current Outpatient Medications:    tacrolimus (PROTOPIC) 0.03 % ointment, Apply topically., Disp: , Rfl:    albuterol (VENTOLIN HFA) 108 (90 Base) MCG/ACT inhaler, Inhale 2 puffs into the lungs every 6 (six) hours as needed for wheezing or shortness of breath., Disp: 18 g, Rfl: 1   amLODipine (NORVASC) 5 MG tablet, TAKE 1 TABLET(5 MG) BY MOUTH DAILY, Disp: 90 tablet, Rfl: 1   azelastine (ASTELIN) 0.1 % nasal spray, Place 1 spray into both nostrils 2 (two) times daily. Use in each nostril as directed, Disp: 30 mL, Rfl: 12   cephALEXin (KEFLEX) 500 MG capsule, Take 1 capsule (500 mg total) by mouth 2 (two) times daily., Disp: 20 capsule, Rfl: 0   clindamycin (CLEOCIN-T) 1 % lotion, Apply topically 2 (two) times daily., Disp: 60 mL, Rfl: 0   EPINEPHrine 0.3 mg/0.3 mL IJ SOAJ injection, Inject into upper thigh for severe allergic reaction, Disp: 2 each, Rfl: 1   fluticasone (FLONASE) 50 MCG/ACT nasal spray, SHAKE LIQUID AND USE 2 SPRAYS IN EACH NOSTRIL EVERY DAY, Disp: 16 g, Rfl: 2   fluticasone furoate-vilanterol (BREO ELLIPTA) 100-25 MCG/ACT AEPB, Inhale 1 puff into the lungs daily., Disp: 1 each, Rfl: 11   guaiFENesin (MUCINEX) 600 MG 12 hr tablet, Take 2 tablets (1,200 mg total) by mouth  2 (two) times daily. (Patient not taking: Reported on 10/22/2021), Disp: 30 tablet, Rfl: 2   pantoprazole (PROTONIX) 40 MG tablet, Take 1 tablet (40 mg total) by mouth daily., Disp: 90 tablet, Rfl: 3   scopolamine (TRANSDERM-SCOP) 1 MG/3DAYS, Place 1 patch (1.5 mg total) onto the skin every 3 (three) days., Disp: 10 patch, Rfl: 12   Vitamin D, Ergocalciferol, (DRISDOL) 1.25 MG (50000 UNIT) CAPS capsule, Take 1 capsule (50,000 Units total) by mouth every 7 (seven) days., Disp: 12 capsule, Rfl: 2:  :   Allergies  Allergen Reactions   Other Anaphylaxis    Nuts    Chlorhexidine Gluconate Itching  :   Family History  Problem Relation Age of Onset   Asthma Mother    Allergies Mother    Asthma Father     Drug abuse Father    Allergies Father   :   Social History   Socioeconomic History   Marital status: Single    Spouse name: Not on file   Number of children: Not on file   Years of education: Not on file   Highest education level: Not on file  Occupational History   Occupation: Juvenile Counsler    Comment: guard   Tobacco Use   Smoking status: Never   Smokeless tobacco: Never  Substance and Sexual Activity   Alcohol use: Yes    Alcohol/week: 0.0 standard drinks of alcohol    Comment: one glass of wine per week/fim   Drug use: No   Sexual activity: Yes    Partners: Male  Other Topics Concern   Not on file  Social History Narrative   Exercise--no   Right handed   Caffiene1 cup   Lives alone and a two story home   Social Determinants of Health   Financial Resource Strain: Not on file  Food Insecurity: Not on file  Transportation Needs: Not on file  Physical Activity: Not on file  Stress: Not on file  Social Connections: Not on file  Intimate Partner Violence: Not on file  :  Review of Systems  Constitutional: Negative.   HENT: Negative.    Eyes: Negative.   Respiratory: Negative.    Cardiovascular: Negative.   Gastrointestinal: Negative.   Genitourinary: Negative.   Musculoskeletal: Negative.   Skin: Negative.   Neurological: Negative.   Endo/Heme/Allergies: Negative.   Psychiatric/Behavioral: Negative.       Exam: Vital signs show temperature of 98.2.  Pulse 90.  Blood pressure 141/47.  Weight is 186 pounds.  _0 @  Physical Exam Vitals reviewed.  Constitutional:      Comments: Axillary exam shows bilateral axillary adenopathy.  Lymph nodes might be a little more prominent in the right axilla than left.  HENT:     Head: Normocephalic and atraumatic.  Eyes:     Pupils: Pupils are equal, round, and reactive to light.  Neck:     Comments: Neck exam shows bilateral cervical adenopathy.  She has lymph nodes in the posterior cervical anterior  cervical chains.  Most lymph nodes are less than 1 cm.  They are mobile and nontender. Cardiovascular:     Rate and Rhythm: Normal rate and regular rhythm.     Heart sounds: Normal heart sounds.  Pulmonary:     Effort: Pulmonary effort is normal.     Breath sounds: Normal breath sounds.  Abdominal:     General: Bowel sounds are normal.     Palpations: Abdomen is soft.  Musculoskeletal:  General: No tenderness or deformity. Normal range of motion.     Cervical back: Normal range of motion.  Lymphadenopathy:     Cervical: No cervical adenopathy.  Skin:    General: Skin is warm and dry.     Findings: No erythema or rash.  Neurological:     Mental Status: She is alert and oriented to person, place, and time.  Psychiatric:        Behavior: Behavior normal.        Thought Content: Thought content normal.        Judgment: Judgment normal.     Recent Labs    02/16/22 1350  WBC 5.1  HGB 13.1  HCT 41.2  PLT 240    Recent Labs    02/16/22 1350  NA 138  K 3.8  CL 104  CO2 23  GLUCOSE 91  BUN 13  CREATININE 0.93  CALCIUM 9.4    Blood smear review: None  Pathology: Pending    Assessment and Plan: Ms. Mcglade is a very charming 61 year old Afro-American female.  She has significant cervical adenopathy.  I suspect that this can be a low-grade non-Hodgkin's lymphoma just by the appearance of the lymph nodes on the CT scan and by their feel.  Her labs look great.  I think the other possibility that we might be looking at would be a mantle cell lymphoma.  The proverbial bottom line is that we have to get a biopsy.  I will see about making a referral to ENT so we can have an excisional biopsy of a cervical lymph node.  I think we really need to have a whole lymph node removed so that we can have a full evaluation.  She will definitely need a CT scan of her body.  I will order a CT of the chest/abdomen/pelvis.  I suspect that we will find adenopathy elsewhere in her  body.  By her exam, I do not see anything that is imminent for complications.  As such, I think that we can have a little bit of flexibility with our scheduling.  As far as treatment goes, this will really depend upon the lymphoma type.  I had a wonderful time with Ruth [name] and her ex-husband.  They are both incredibly nice.  Ruth [name] is in great shape.  We can clearly be aggressive with her care.  At this point time, I do not think we have to do a PET scan on her.  I do not think we need to do a bone marrow biopsy on her given her normal blood counts.  Once we get the CT scan results back and the biopsy results back, then we will get his hand for back and come up with our game plan for treatment.

## 2022-02-16 NOTE — Progress Notes (Signed)
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 Gave patient "Your Patient Navigator" handout which explains my role, areas in which I am able to help, and all the contact information for myself and the office. Also gave patient MD and Navigator business card. Reviewed with patient the general overview of expected course after initial diagnosis and time frame for all steps to be completed.  New patient packet given to patient which includes: orientation to office and staff; campus directory; education on My Chart and Advance Directives; and patient centered education on lymphoma.   Patient comes in with her husband, but states that she lives on her own. She does state that he is supportive and can be there to help if needed. 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 She feels like her employer will be flexible with scheduling scans and tests. She is extremely anxious.   Patient completed visit with Dr. Ennever.   Will follow up with patient tomorrow once note is dictated and orders placed.   Patient understands all follow up procedures and expectations. They have my number to reach out for any further clarification or additional needs.   Oncology Nurse Navigator Documentation     02/16/2022    2:00 PM  Oncology Nurse Navigator Flowsheets  Navigator Follow Up Date: 02/17/2022  Navigator Follow Up Reason: Appointment Review  Navigator Location [facility]  Referral Date to RadOnc/MedOnc 02/09/2022  Navigator Encounter Type Initial MedOnc  Patient Visit Type MedOnc  Treatment Phase Abnormal Scans  Barriers/Navigation Needs Coordination of Care;Education  Education Pain/ Symptom Management;Preparing for Upcoming Surgery/ Treatment  Interventions Education;Psycho-Social Support  Acuity Level 2-Minimal Needs (1-2 Barriers Identified)  Education  Method Verbal;Written  Support Groups/Services Friends and Family  Time Spent with Patient 30    

## 2022-02-17 ENCOUNTER — Encounter: Payer: Self-pay | Admitting: *Deleted

## 2022-02-17 DIAGNOSIS — R591 Generalized enlarged lymph nodes: Secondary | ICD-10-CM

## 2022-02-17 LAB — BETA 2 MICROGLOBULIN, SERUM: Beta-2 Microglobulin: 2.7 mg/L — ABNORMAL HIGH (ref 0.6–2.4)

## 2022-02-17 LAB — KAPPA/LAMBDA LIGHT CHAINS
Kappa free light chain: 33.7 mg/L — ABNORMAL HIGH (ref 3.3–19.4)
Kappa, lambda light chain ratio: 1.57 (ref 0.26–1.65)
Lambda free light chains: 21.4 mg/L (ref 5.7–26.3)

## 2022-02-17 LAB — URIC ACID: Uric Acid, Serum: 5.7 mg/dL (ref 2.5–7.1)

## 2022-02-17 NOTE — Progress Notes (Signed)
Patient will need a referral to ENT for consideration of excisional lymph node biopsy. Dr Marin Olp will speak to Dr Redmond Baseman who is out of the office until Monday. Referral order placed.   Patient will also need a CT CAP.   Called and left a message for patient regarding the referral to Dr Redmond Baseman. Also left the number for scheduling at imaging for her to call to schedule the CT CAP.  Oncology Nurse Navigator Documentation     02/17/2022    2:15 PM  Oncology Nurse Navigator Flowsheets  Navigator Follow Up Date: 02/23/2022  Navigator Follow Up Reason: Appointment Review  Navigator Location [facility]  Navigator Encounter Type Telephone  Telephone Education;Outgoing Call  Patient Visit Type MedOnc  Treatment Phase Abnormal Scans  Barriers/Navigation Needs Coordination of Care;Education  Education Other  Interventions Education;Referrals  Acuity Level 2-Minimal Needs (1-2 Barriers Identified)  Referrals Other  Education Method Verbal  Support Groups/Services Friends and Family  Time Spent with Patient 15

## 2022-02-18 LAB — IGG, IGA, IGM
IgA: 614 mg/dL — ABNORMAL HIGH (ref 87–352)
IgG (Immunoglobin G), Serum: 2319 mg/dL — ABNORMAL HIGH (ref 586–1602)
IgM (Immunoglobulin M), Srm: 59 mg/dL (ref 26–217)

## 2022-02-24 ENCOUNTER — Encounter: Payer: Self-pay | Admitting: *Deleted

## 2022-02-24 NOTE — Progress Notes (Signed)
Patient is scheduled to see ENT Dr Redmond Baseman on 03/03/2022. CT has still not yet been scheduled as insurance is still pending.   Called patient and left voicemail explaining that insurance was still pending, and we would schedule as soon as we received authorization. I encouraged her to call with any questions or concerns.   Oncology Nurse Navigator Documentation     02/24/2022   12:45 PM  Oncology Nurse Navigator Flowsheets  Navigator Follow Up Date: 03/03/2022  Navigator Follow Up Reason: Review Note  Navigator Location [facility]  Navigator Encounter Type Telephone;Appt/Treatment Plan Review  Telephone Patient Update;Outgoing Call  Patient Visit Type MedOnc  Treatment Phase Abnormal Scans  Barriers/Navigation Needs Coordination of Care;Education  Education Other  Interventions Education  Acuity Level 2-Minimal Needs (1-2 Barriers Identified)  Education Method Verbal  Support Groups/Services Friends and Family  Time Spent with Patient 15

## 2022-02-25 ENCOUNTER — Telehealth (HOSPITAL_BASED_OUTPATIENT_CLINIC_OR_DEPARTMENT_OTHER): Payer: Self-pay

## 2022-02-25 ENCOUNTER — Encounter: Payer: Self-pay | Admitting: *Deleted

## 2022-02-25 NOTE — Progress Notes (Signed)
Patient's CT authorized. Attempted to call to schedule CT. Message left on VM. Will follow for scheduling.   Oncology Nurse Navigator Documentation     02/25/2022    2:00 PM  Oncology Nurse Navigator Flowsheets  Navigator Location [facility]  Navigator Encounter Type Appt/Treatment Plan Review  Telephone Outgoing Call  Patient Visit Type MedOnc  Treatment Phase Abnormal Scans  Barriers/Navigation Needs Coordination of Care;Education  Interventions Coordination of Care  Acuity Level 2-Minimal Needs (1-2 Barriers Identified)  Coordination of Care Radiology  Support Groups/Services Friends and Family  Time Spent with Patient 15

## 2022-02-26 ENCOUNTER — Encounter: Payer: Self-pay | Admitting: *Deleted

## 2022-02-26 NOTE — Progress Notes (Signed)
Patient has still not scheduled her CT scan. Called and left a voicemail requesting that she schedule her CT scan. Will continue follow.   Oncology Nurse Navigator Documentation     02/26/2022    3:00 PM  Oncology Nurse Navigator Flowsheets  Navigator Location [facility]  Navigator Encounter Type Appt/Treatment Plan Review;Telephone  Telephone Outgoing Call  Patient Visit Type MedOnc  Treatment Phase Abnormal Scans  Barriers/Navigation Needs Coordination of Care;Education  Interventions Education  Acuity Level 2-Minimal Needs (1-2 Barriers Identified)  Coordination of Care Radiology  Time Spent with Patient 15

## 2022-03-10 ENCOUNTER — Telehealth: Payer: Self-pay

## 2022-03-10 NOTE — Telephone Encounter (Signed)
CSW attempted to contact patient.  Left vm. 

## 2022-03-11 ENCOUNTER — Encounter: Payer: Self-pay | Admitting: *Deleted

## 2022-03-11 NOTE — Progress Notes (Signed)
Patient scheduled on 04/02/2022 for biopsy.  Oncology Nurse Navigator Documentation     03/11/2022    1:30 PM  Oncology Nurse Navigator Flowsheets  Navigator Follow Up Date: 04/02/2022  Navigator Follow Up Reason: Surgery  Navigator Location [facility]  Navigator Encounter Type Appt/Treatment Plan Review;Telephone  Patient Visit Type MedOnc  Treatment Phase Abnormal Scans  Barriers/Navigation Needs Coordination of Care;Education  Interventions None Required  Acuity Level 2-Minimal Needs (1-2 Barriers Identified)  Support Groups/Services Friends and Family  Time Spent with Patient 15

## 2022-03-26 NOTE — Telephone Encounter (Signed)
Angie from  [facility]  called and states that patient needs to have surgical clearance by our office. She is sch for surgery on 04-02-22 patient was last seen by Tomi Likens on 10-22-21. She has a return visit with Jaffe on 04-28-22.  They are faxing over the clearance form to our office today. I explained to her that Tomi Likens was out of the office next week

## 2022-03-30 NOTE — Telephone Encounter (Signed)
Patient advised of note below.s

## 2022-03-30 NOTE — Telephone Encounter (Signed)
LMOVM for Angie, Dr.Jaffe declined Approval for Clearance for patient to have surgery.     Tried calling patient no answer, Please give the office  a call to discuss trying to come in sooner.

## 2022-04-02 ENCOUNTER — Encounter: Payer: Self-pay | Admitting: *Deleted

## 2022-04-02 NOTE — Progress Notes (Signed)
Patient was to have an excisional biopsy today, however surgical clearance from her neurologist was not obtained. Patient notified on 03/30/2022. She states she will reschedule once she makes an appointment with her neurologist.   Oncology Nurse Navigator Documentation     04/02/2022   11:30 AM  Oncology Nurse Navigator Flowsheets  Navigator Follow Up Date: 04/23/2022  Navigator Follow Up Reason: Appointment Review  Navigator Location [facility]  Navigator Encounter Type Appt/Treatment Plan Review  Patient Visit Type MedOnc  Treatment Phase Abnormal Scans  Barriers/Navigation Needs Coordination of Care;Education  Interventions None Required  Acuity Level 2-Minimal Needs (1-2 Barriers Identified)  Support Groups/Services Friends and Family  Time Spent with Patient 15

## 2022-04-08 NOTE — Progress Notes (Signed)
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 Patrone is cleared for surgery.  Will fax form to ENT 2 Repeat MRA of head scheduled for 1/27.  If stable, would repeat in one year.   Subjective:  Pleas Patricia. 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   UPDATE: No migraines.  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    Current NSAIDS/analgesics:  Tylenol, rarely ibuprofen Current triptans:  none Current ergotamine:  none Current anti-emetic:  none Current muscle relaxants:  none Current Antihypertensive medications:  amlodipine Current Antidepressant medications:  none Current Anticonvulsant medications:  none Current anti-CGRP:  none Current Vitamins/Herbal/Supplements:  none Current Antihistamines/Decongestants:  Flonase Other therapy:  none Hormone/birth control:  none   Caffeine:  Sweet tea daily.  No coffee or cola Diet:  Drinks water.  Usually wouldn't skip meals but she has since topiramate Exercise:  Not recently Depression: no ; Anxiety:  yes Other pain:  Localized head pain from surgery Sleep hygiene:  Sleeps okay.  Feels tired   HISTORY: She has longstanding history of headaches since young adulthood, which she says have been attributed to allergies and sinus issues. In 2016, she reported different type of headache.  They were right sided, throbbing/pressure and 8/10 intensity.  At that time, they lasted all day and occurred daily.  It is associated with nausea, photophobia, phonophobia and osmophobia.  She also reported seeing floaters but her eye doctor said it  was normal for her age.  She also feels a little dizzy.  She would Advil or Tylenol almost daily, with variable effect.  She denies family history of headache. She then has had episodes of severe dizziness, not associated with headache.  It is not spinning but it is a sense of movement and feeling off-balance.  There is mild nausea but no vomiting.  It occurs spontaneously throughout the day and each episode can last up to 4 hours non-stop.  She noted some bilateral tinnitus.  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  She subsequently established care with another neurologist, Dr. Felecia Shelling, who recommended close follow up.  She was lost to follow up with Dr. Felecia Shelling due to the COVID-19 pandemic until she saw him again in April 2021 and started her on topiramate.  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     Prior to the aneurysm rupture, migraines were infrequent,about once a month, and responded within 1 hour with Tylenol.  Since the surgery, she has had some head on side of the surgery and persistent right ear pressure.  ENT found nothing wrong.  Repeat MRA of head on 03/31/2021 was stable.   Imaging personally reviewed: 03/11/2015 MRI BRAIN W WO:  1.  No acute intracranial abnormality.  2. Mild to moderate for age nonspecific mostly subcortical cerebral white matter signal changes. Solitary small nonspecific focus of  T2 hyperintensity in the cerebellum, most resembling a chronic lacune. 04/16/2015 CTA HEAD:  1. 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  2. Otherwise normal CTA circle of Willis without focal stenosis, other aneurysm, or branch vessel occlusion.  3. Hypoplastic right ICA as discussed on the  previous studies 02/23/2017 MRI BRAIN WO:  1. Mild extent of white matter foci in the subcortical white matter in the left cerebral hemisphere consistent with mild chronic microvascular ischemic changes. None of these appear to be acute and there is no change when compared to the MRI dated 03/11/2015.  07/21/2019 CT BRAIN WO: 1. There are findings concerning for potential subarachnoid hemorrhage, although this could simply reflect "pseudo subarachnoid hemorrhage" related to interval development of mild hydrocephalus compared to prior study from 2017. Given the patient's documented history of ACA aneurysm, further evaluation with repeat head CTA is recommended at this time. 07/21/2019 CTA HEAD & NECK:  1. 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  Trace subarachnoid hemorrhage including trace IVH visible now. Mild proximal ACA vasospasm. Acute ventriculomegaly, although no definite transependymal edema.  Note also dominant Left ACA A1. 2. Elsewhere stable and negative CTA head and neck. No atherosclerosis or arterial stenosis identified. 03/31/2021 MRA HEAD:  1. Sequelae of prior surgical clipping of anterior communicating artery aneurysm with persistent faint 2 mm remnant. Appearance is stable and not significantly changed as compared to previous.2. Otherwise stable and negative intracranial MRA.     Past NSAIDS/analgesics:  none Past abortive triptans:  none Past abortive ergotamine:  none Past muscle relaxants:  none Past anti-emetic:  Zofran ODT '4mg'$  Past antihypertensive medications:  Metoprolol succinate, losartan, valsartain, HCTZ Past antidepressant medications:  Sertraline '25mg'$ , Wellbutrin Past anticonvulsant medications:  topiramate '50mg'$  QHS Past anti-CGRP:  none Past vitamins/Herbal/Supplements:  none Past antihistamines/decongestants:  none Other past therapies:  none     Family history of  headache or aneurysm:  no  PAST MEDICAL HISTORY: Past Medical History:  Diagnosis Date   Allergic rhinitis, cause unspecified    Asthma    Eczema    Food allergy    Gallbladder problem    Headache    Hypertension     MEDICATIONS: Current Outpatient Medications on File Prior to Visit  Medication Sig Dispense Refill   albuterol (VENTOLIN HFA) 108 (90 Base) MCG/ACT inhaler Inhale 2 puffs into the lungs every 6 (six) hours as needed for wheezing or shortness of breath. 18 g 1   amLODipine (NORVASC) 5 MG tablet TAKE 1 TABLET(5 MG) BY MOUTH DAILY 90 tablet 1   azelastine (ASTELIN) 0.1 % nasal spray Place 1 spray into both nostrils 2 (two) times daily. Use in each nostril as directed 30 mL 12   cephALEXin (KEFLEX) 500 MG capsule Take 1 capsule (500 mg total) by mouth 2 (two) times daily. 20 capsule 0   clindamycin (CLEOCIN-T) 1 % lotion Apply topically 2 (two) times daily. 60 mL 0   EPINEPHrine 0.3 mg/0.3 mL IJ SOAJ injection Inject into upper thigh for severe allergic reaction 2 each 1   fluticasone (FLONASE) 50 MCG/ACT nasal spray SHAKE LIQUID AND USE 2 SPRAYS IN EACH NOSTRIL EVERY DAY 16 g 2   fluticasone furoate-vilanterol (BREO ELLIPTA) 100-25 MCG/ACT AEPB Inhale 1 puff into the lungs daily. 1 each 11   guaiFENesin (MUCINEX) 600 MG 12 hr tablet Take 2 tablets (1,200 mg total) by mouth 2 (two) times daily. (Patient  not taking: Reported on 10/22/2021) 30 tablet 2   pantoprazole (PROTONIX) 40 MG tablet Take 1 tablet (40 mg total) by mouth daily. 90 tablet 3   scopolamine (TRANSDERM-SCOP) 1 MG/3DAYS Place 1 patch (1.5 mg total) onto the skin every 3 (three) days. 10 patch 12   tacrolimus (PROTOPIC) 0.03 % ointment Apply topically.     Vitamin D, Ergocalciferol, (DRISDOL) 1.25 MG (50000 UNIT) CAPS capsule Take 1 capsule (50,000 Units total) by mouth every 7 (seven) days. 12 capsule 2   No current facility-administered medications on file prior to visit.    ALLERGIES: Allergies  Allergen  Reactions   Other Anaphylaxis    Nuts    Chlorhexidine Gluconate Itching    FAMILY HISTORY: Family History  Problem Relation Age of Onset   Asthma Mother    Allergies Mother    Asthma Father    Drug abuse Father    Allergies Father       Objective:  Blood pressure 136/89, pulse 92, height '5\' 4"'$  (1.626 m), weight 187 lb 6.4 oz (85 kg), last menstrual period 09/19/2015, SpO2 99 %. General: No acute distress.  Patient appears well-groomed.   Head:  Normocephalic/atraumatic Eyes:  Fundi examined but not visualized Neck: supple, no paraspinal tenderness, full range of motion Heart:  Regular rate and rhythm Neurological Exam: alert and oriented to person, place, and time.  Speech fluent and not dysarthric, language intact.  CN II-XII intact. Bulk and tone normal, muscle strength 5/5 throughout.  Sensation to light touch intact.  Deep tendon reflexes 2+ throughout.  Finger to nose testing intact.  Gait normal, Romberg negative.   Metta Clines, DO  CC: Roma Schanz, DO

## 2022-04-09 ENCOUNTER — Encounter: Payer: Self-pay | Admitting: Neurology

## 2022-04-09 ENCOUNTER — Ambulatory Visit: Payer: Managed Care, Other (non HMO) | Admitting: Neurology

## 2022-04-09 VITALS — BP 136/89 | HR 92 | Ht 64.0 in | Wt 187.4 lb

## 2022-04-09 DIAGNOSIS — G43009 Migraine without aura, not intractable, without status migrainosus: Secondary | ICD-10-CM | POA: Diagnosis not present

## 2022-04-09 DIAGNOSIS — I671 Cerebral aneurysm, nonruptured: Secondary | ICD-10-CM | POA: Diagnosis not present

## 2022-04-23 ENCOUNTER — Encounter: Payer: Self-pay | Admitting: *Deleted

## 2022-04-23 NOTE — Progress Notes (Signed)
Called and spoke to Angie at Dr Redmond Baseman office. She stated that patient had excisional biopsy on 04/19/2022. Procedure notes were received, however pathology is still not resulted. Will follow up on Monday for path results.   Oncology Nurse Navigator Documentation     04/23/2022    9:00 AM  Oncology Nurse Navigator Flowsheets  Navigator Follow Up Date: 04/26/2022  Navigator Follow Up Reason: Pathology  Navigator Location [facility]  Navigator Encounter Type Telephone  Telephone Outgoing Call  Patient Visit Type MedOnc  Treatment Phase Abnormal Scans  Barriers/Navigation Needs Coordination of Care;Education  Interventions Coordination of Care  Acuity Level 2-Minimal Needs (1-2 Barriers Identified)  Coordination of Care Other  Support Groups/Services Friends and Family  Time Spent with Patient 30

## 2022-04-24 ENCOUNTER — Other Ambulatory Visit: Payer: Managed Care, Other (non HMO)

## 2022-04-26 ENCOUNTER — Encounter: Payer: Self-pay | Admitting: *Deleted

## 2022-04-26 NOTE — Progress Notes (Signed)
Called and spoke to [facility] at Dr Redmond Baseman office. Pathology received which is negative for malignancy. Report shared with Dr Marin Olp. He will follow up with patient in a few weeks. Message sent to scheduling.   Oncology Nurse Navigator Documentation     04/26/2022   10:45 AM  Oncology Nurse Navigator Flowsheets  Navigation Complete Date: 04/26/2022  Post Navigation: Continue to Follow Patient? No  Reason Not Navigating Patient: No Cancer Diagnosis  Navigator Location [facility]  Navigator Encounter Type Pathology Review;Telephone  Telephone Outgoing Call  Patient Visit Type MedOnc  Treatment Phase Other  Barriers/Navigation Needs No Barriers At This Time  Interventions Coordination of Care  Acuity Level 1-No Barriers  Coordination of Care Appts;Pathology  Support Groups/Services Friends and Family  Time Spent with Patient 30

## 2022-04-28 ENCOUNTER — Ambulatory Visit: Payer: Managed Care, Other (non HMO) | Admitting: Neurology

## 2022-05-11 ENCOUNTER — Ambulatory Visit
Admission: RE | Admit: 2022-05-11 | Discharge: 2022-05-11 | Disposition: A | Discharge status: Home / Self Care | Payer: Managed Care, Other (non HMO) | Source: Ambulatory Visit | Attending: Neurology

## 2022-05-11 DIAGNOSIS — I671 Cerebral aneurysm, nonruptured: Secondary | ICD-10-CM

## 2022-05-11 DIAGNOSIS — G43009 Migraine without aura, not intractable, without status migrainosus: Secondary | ICD-10-CM

## 2022-05-12 ENCOUNTER — Telehealth: Payer: Self-pay | Admitting: Anesthesiology

## 2022-05-12 NOTE — Telephone Encounter (Signed)
Marzetta Board from [facility] left message stating she needs to speak to someone about an MRI report. Call back number is [phone].

## 2022-05-12 NOTE — Telephone Encounter (Signed)
Called patient and let her know results. Patient will let us know if she needs results to be sent anywhere else

## 2022-05-12 NOTE — Telephone Encounter (Signed)
Radiologist wanted to make Dr. Tomi Likens aware of the nodules on impression 2 and the neoplasm. Radiologist is recommending ENT consult

## 2022-05-18 ENCOUNTER — Encounter: Payer: Self-pay | Admitting: Family Medicine

## 2022-05-18 ENCOUNTER — Ambulatory Visit: Payer: Managed Care, Other (non HMO) | Admitting: Family Medicine

## 2022-05-18 VITALS — BP 120/80 | HR 92 | Temp 98.3°F | Resp 18 | Ht 64.0 in | Wt 192.2 lb

## 2022-05-18 DIAGNOSIS — D869 Sarcoidosis, unspecified: Secondary | ICD-10-CM

## 2022-05-18 NOTE — Assessment & Plan Note (Signed)
Refer to rheumatology  We need the records from ent

## 2022-05-18 NOTE — Progress Notes (Addendum)
Subjective:   By signing my name below, I, Ruth [name], attest that this documentation has been prepared under the direction and in the presence of Ann Held, DO. 05/18/2022   Patient ID: Ruth [name], female    DOB: Oct 20, 1960, 62 y.o.   MRN: [id number]  Chief Complaint  Patient presents with   Referral    Pt states she spoke with Dr. Garnette Gunner and he advised to discuss a referral to rheumatology     HPI Patient is in today for a office visit.   She reports her recent biopsy tested negative for cancer. She is requesting a referral to a rheumatologist to test for sarcoidosis.    Past Medical History:  Diagnosis Date   Allergic rhinitis, cause unspecified    Asthma    Eczema    Food allergy    Gallbladder problem    Headache    Hypertension     Past Surgical History:  Procedure Laterality Date   CESAREAN SECTION     CESAREAN SECTION Bilateral [id number]    Family History  Problem Relation Age of Onset   Asthma Mother    Allergies Mother    Asthma Father    Drug abuse Father    Allergies Father     Social History   Socioeconomic History   Marital status: Single    Spouse name: Not on file   Number of children: Not on file   Years of education: Not on file   Highest education level: Not on file  Occupational History   Occupation: Juvenile Counsler    Comment: guard   Tobacco Use   Smoking status: Never   Smokeless tobacco: Never  Substance and Sexual Activity   Alcohol use: Yes    Alcohol/week: 0.0 standard drinks of alcohol    Comment: one glass of wine per week/fim   Drug use: No   Sexual activity: Yes    Partners: Male  Other Topics Concern   Not on file  Social History Narrative   Exercise--no   Right handed   Caffiene1 cup   Lives alone and a two story home   Social Determinants of Health   Financial Resource Strain: Not on file  Food Insecurity: Not on file  Transportation Needs: Not on file  Physical Activity: Not on file   Stress: Not on file  Social Connections: Not on file  Intimate Partner Violence: Not on file    Outpatient Medications Prior to Visit  Medication Sig Dispense Refill   albuterol (VENTOLIN HFA) 108 (90 Base) MCG/ACT inhaler Inhale 2 puffs into the lungs every 6 (six) hours as needed for wheezing or shortness of breath. 18 g 1   amLODipine (NORVASC) 5 MG tablet TAKE 1 TABLET(5 MG) BY MOUTH DAILY 90 tablet 1   azelastine (ASTELIN) 0.1 % nasal spray Place 1 spray into both nostrils 2 (two) times daily. Use in each nostril as directed 30 mL 12   EPINEPHrine 0.3 mg/0.3 mL IJ SOAJ injection Inject into upper thigh for severe allergic reaction 2 each 1   fluticasone (FLONASE) 50 MCG/ACT nasal spray SHAKE LIQUID AND USE 2 SPRAYS IN EACH NOSTRIL EVERY DAY 16 g 2   fluticasone furoate-vilanterol (BREO ELLIPTA) 100-25 MCG/ACT AEPB Inhale 1 puff into the lungs daily. 1 each 11   guaiFENesin (MUCINEX) 600 MG 12 hr tablet Take 2 tablets (1,200 mg total) by mouth 2 (two) times daily. 30 tablet 2   pantoprazole (PROTONIX) 40 MG tablet Take  1 tablet (40 mg total) by mouth daily. 90 tablet 3   scopolamine (TRANSDERM-SCOP) 1 MG/3DAYS Place 1 patch (1.5 mg total) onto the skin every 3 (three) days. 10 patch 12   tacrolimus (PROTOPIC) 0.03 % ointment Apply topically.     Vitamin D, Ergocalciferol, (DRISDOL) 1.25 MG (50000 UNIT) CAPS capsule Take 1 capsule (50,000 Units total) by mouth every 7 (seven) days. 12 capsule 2   No facility-administered medications prior to visit.    Allergies  Allergen Reactions   Other Anaphylaxis    Nuts    Chlorhexidine Gluconate Itching    ROS     Objective:    Physical Exam Constitutional:      General: She is not in acute distress.    Appearance: Normal appearance. She is not ill-appearing.  HENT:     Head: Normocephalic and atraumatic.     Right Ear: External ear normal.     Left Ear: External ear normal.  Eyes:     Extraocular Movements: Extraocular movements  intact.     Pupils: Pupils are equal, round, and reactive to light.  Cardiovascular:     Rate and Rhythm: Normal rate and regular rhythm.     Heart sounds: Normal heart sounds. No murmur heard.    No gallop.  Pulmonary:     Effort: Pulmonary effort is normal. No respiratory distress.     Breath sounds: Normal breath sounds. No wheezing or rales.  Skin:    General: Skin is warm and dry.  Neurological:     Mental Status: She is alert and oriented to person, place, and time.  Psychiatric:        Judgment: Judgment normal.     BP 120/80 (BP Location: Right Arm, Patient Position: Sitting, Cuff Size: Large)   Pulse 92   Temp 98.3 F (36.8 C) (Oral)   Resp 18   Ht 5' 4"$  (1.626 m)   Wt 192 lb 3.2 oz (87.2 kg)   LMP 09/19/2015   SpO2 99%   BMI 32.99 kg/m  Wt Readings from Last 3 Encounters:  05/18/22 192 lb 3.2 oz (87.2 kg)  04/09/22 187 lb 6.4 oz (85 kg)  02/16/22 186 lb (84.4 kg)       Assessment & Plan:  Sarcoidosis Assessment & Plan: Refer to rheumatology  We need the records from ent   Orders: -     Ambulatory referral to Rheumatology    I, Ann Held, DO, personally preformed the services described in this documentation.  All medical record entries made by the scribe were at my direction and in my presence.  I have reviewed the chart and discharge instructions (if applicable) and agree that the record reflects my personal performance and is accurate and complete. 05/18/2022   I,Ruth [name],acting as a scribe for Ann Held, DO.,have documented all relevant documentation on the behalf of Ann Held, DO,as directed by  Ann Held, DO while in the presence of Ann Held, DO.   Ann Held, DO

## 2022-05-18 NOTE — Patient Instructions (Signed)
Sarcoidosis  Sarcoidosis is a disease that can cause inflammation in many areas of the body. It most often affects the lungs (pulmonary sarcoidosis). Sarcoidosis can also affect the lymph nodes, liver, eyes, skin, heart, or any other body tissue. Normally, cells that are part of the body's disease-fighting system (immune system) attack harmful substances in the body, such as germs. This immune system response causes inflammation. After the harmful substance is destroyed, the inflammation goes away. When you have sarcoidosis, your immune system causes inflammation even when there are no harmful substances, and the inflammation does not go away. Sarcoidosis also causes cells from your immune system to form small lumps (granulomas) in the affected area of your body. What are the causes? The exact cause of sarcoidosis is not known.  If you have a family history of this disease (genetic predisposition), the immune system response that leads to inflammation may be triggered by something in your environment, such as: Bacteria or viruses. Metals. Chemicals. Dust. Mold or mildew. What increases the risk? You may be more likely to develop this condition if you: Have a family history of the disease. Are African American. Are of Northern European descent. Are 50-58 years old. Are female. Work as a Airline pilot. Work in an environment where you are exposed to metals, chemicals, mold or mildew, or insecticides. What are the signs or symptoms? Some people with sarcoidosis have no symptoms. Others have very mild symptoms. The symptoms usually depend on the organ that is affected. Sarcoidosis most often affects the lungs, which may lead to symptoms such as: Chest pain. Coughing. Wheezing. Shortness of breath. Other common symptoms include: Night sweats. Fever. Weight loss. Tiredness (fatigue). Swollen lymph nodes. Joint pain. How is this diagnosed? This condition may be diagnosed based on: Your  symptoms and medical history. A physical exam. Imaging tests such as: Chest X-ray. CT scan. MRI. PET scan. Lung function tests. These tests evaluate your breathing and check for problems that may be related to sarcoidosis. A procedure to remove a tissue sample for testing (biopsy). You may have a biopsy of lung tissue if that is where you are having symptoms. You may have tests to check for any complications of the condition. These tests may include: Eye exams. MRI of the heart or brain. Echocardiogram. ECG (electrocardiogram). How is this treated? In some cases, sarcoidosis does not require a specific treatment because it causes no symptoms or only mild symptoms. If your symptoms bother you or are severe, you may be prescribed medicines to reduce inflammation or relieve symptoms. These medicines may include: Prednisone. This is a steroid that reduces inflammation related to sarcoidosis. Hydroxychloroquine. This may be used to treat sarcoidosis that affects the skin, eyes, or brain. Certain medicines that affect the immune system. These can help with sarcoidosis in the joints, eyes, skin, or lungs. Medicines that you breathe in (inhalers). Inhalers can help you breathe if sarcoidosis affects your lungs. Follow these instructions at home:  Do not use any products that contain nicotine or tobacco. These products include cigarettes, chewing tobacco, and vaping devices, such as e-cigarettes. If you need help quitting, ask your health care provider. Avoid secondhand smoke and irritating dust or chemicals. Stay indoors on days when air quality is poor in your area. Return to your normal activities as told by your health care provider. Ask your health care provider what activities are safe for you. Take or use over-the-counter and prescription medicines only as told by your health care provider. Keep all follow-up visits.  This is important. Where to find more information National Heart, Lung,  and [employer] Institute: https://wilson-eaton.com/ Contact a health care provider if: You have vision problems. You have a dry cough that does not go away. You have an irregular heartbeat. You have pain or aches in your joints, hands, or feet. You have an unexplained rash. Get help right away if: You have chest pain. You have trouble breathing. These symptoms may represent a serious problem that is an emergency. Do not wait to see if the symptoms will go away. Get medical help right away. Call your local emergency services (911 in the [place] Do not drive yourself to the hospital. Summary Sarcoidosis is a disease that can cause inflammation in many body areas of the body. It most often affects the lungs (pulmonary sarcoidosis). It can also affect the lymph nodes, liver, eyes, skin, heart, or any other body tissue. When you have sarcoidosis, cells from your immune system form small lumps (granulomas) in the affected area of your body. Sarcoidosis sometimes does not require a specific treatment because it causes no symptoms or only mild symptoms. If your symptoms bother you or are severe, you may be prescribed medicines to reduce inflammation or relieve symptoms. This information is not intended to replace advice given to you by your health care provider. Make sure you discuss any questions you have with your health care provider. Document Revised: 01/15/2020 Document Reviewed: 01/15/2020 Elsevier Patient Education  [employer].

## 2022-05-20 ENCOUNTER — Other Ambulatory Visit: Payer: Managed Care, Other (non HMO)

## 2022-05-20 ENCOUNTER — Ambulatory Visit: Payer: Managed Care, Other (non HMO) | Admitting: Hematology & Oncology

## 2022-06-27 ENCOUNTER — Telehealth: Payer: Managed Care, Other (non HMO) | Admitting: Nurse Practitioner

## 2022-06-27 DIAGNOSIS — J302 Other seasonal allergic rhinitis: Secondary | ICD-10-CM | POA: Diagnosis not present

## 2022-06-27 DIAGNOSIS — J301 Allergic rhinitis due to pollen: Secondary | ICD-10-CM | POA: Diagnosis not present

## 2022-06-27 MED ORDER — FLUTICASONE PROPIONATE 50 MCG/ACT NA SUSP: 2.0000 | Freq: Every day | NASAL | 0 refills | Status: DC

## 2022-06-27 MED ORDER — PREDNISONE 10 MG PO TABS: ORAL_TABLET | ORAL | 0 refills | Status: DC

## 2022-06-27 NOTE — Progress Notes (Signed)
Virtual Visit Consent   Ruth [name], you are scheduled for a virtual visit with a [facility] provider today. Just as with appointments in the office, your consent must be obtained to participate. Your consent will be active for this visit and any virtual visit you may have with one of our providers in the next 365 days. If you have a MyChart account, a copy of this consent can be sent to you electronically.  As this is a virtual visit, video technology does not allow for your provider to perform a traditional examination. This may limit your provider's ability to fully assess your condition. If your provider identifies any concerns that need to be evaluated in person or the need to arrange testing (such as labs, EKG, etc.), we will make arrangements to do so. Although advances in technology are sophisticated, we cannot ensure that it will always work on either your end or our end. If the connection with a video visit is poor, the visit may have to be switched to a telephone visit. With either a video or telephone visit, we are not always able to ensure that we have a secure connection.  By engaging in this virtual visit, you consent to the provision of healthcare and authorize for your insurance to be billed (if applicable) for the services provided during this visit. Depending on your insurance coverage, you may receive a charge related to this service.  I need to obtain your verbal consent now. Are you willing to proceed with your visit today? Ruth [name] has provided verbal consent on 06/27/2022 for a virtual visit (video or telephone). Gildardo Pounds, NP  Date: 06/27/2022 9:07 AM  Virtual Visit via Video Note   I, Gildardo Pounds, connected with  Ruth [name]  ([id number], 1960/08/01) on 06/27/22 at  9:00 AM EDT by a video-enabled telemedicine application and verified that I am speaking with the correct person using two identifiers.  Location: Patient: Virtual Visit Location  Patient: Home Provider: Virtual Visit Location Provider: Home Office   I discussed the limitations of evaluation and management by telemedicine and the availability of in person appointments. The patient expressed understanding and agreed to proceed.    History of Present Illness: Ruth [name] is a 62 y.o. who identifies as a female who was assigned female at birth, and is being seen today for worsening seasonal allergy symptoms.  Patient's symptoms include cough, itchy nose, nasal congestion, postnasal drip, sinus pressure, watery eyes, and hoarseness . These symptoms are perennial with seasonal exacerbation. Current triggers include exposure to pollens. The patient has been suffering from these symptoms for a few days. The patient has tried over the counter medications, prescription antihistamines, and prescription nasal sprays with poor relief of symptoms. Immunotherapy has never been tried. The patient has never had nasal polyps. The patient has a history of asthma. The patient does not suffer from frequent sinopulmonary infections. The patient has not had sinus surgery in the past.   Problems:  Patient Active Problem List   Diagnosis Date Noted   Sarcoidosis 05/18/2022   Lymphadenopathy 10/19/2021   Syncope 03/31/2021   Environmental and seasonal allergies 02/23/2021   Acne vulgaris 02/23/2021   Cerebellar ataxia in diseases classified elsewhere [facility]) 07/07/2020   Preventative health care 07/07/2020   SAH (subarachnoid hemorrhage) ([place]) 07/21/2019   RUQ pain 06/18/2019   Dyspepsia 06/18/2019   Moderate persistent asthma with acute exacerbation 03/05/2019   Vitamin D deficiency 07/03/2018   Depression 07/03/2018  Class 2 severe obesity with serious comorbidity and body mass index (BMI) of 37.0 to 37.9 in adult [facility]) 07/03/2018   Essential hypertension 09/19/2017   Aneurysm, cerebral, nonruptured 05/06/2015   White matter abnormality on MRI of brain 05/06/2015   Migraine  headache without aura 05/06/2015   Dizziness 05/06/2015   Foreign body of ear, left 09/05/2012   HTN (hypertension) 06/12/2012   Allergic rhinitis due to pollen 12/27/2007   Allergic-infective asthma 08/29/2006   ECZEMA 08/29/2006    Allergies:  Allergies  Allergen Reactions   Other Anaphylaxis    Nuts    Chlorhexidine Gluconate Itching   Medications:  Current Outpatient Medications:    predniSONE (DELTASONE) 10 MG tablet, Directions for 6 day taper: Day 1: 2 tablets before breakfast, 1 after both lunch & dinner and 2 at bedtime Day 2: 1 tab before breakfast, 1 after both lunch & dinner and 2 at bedtime Day 3: 1 tab at each meal & 1 at bedtime Day 4: 1 tab at breakfast, 1 at lunch, 1 at bedtime Day 5: 1 tab at breakfast & 1 tab at bedtime Day 6: 1 tab at breakfast, Disp: 21 tablet, Rfl: 0   albuterol (VENTOLIN HFA) 108 (90 Base) MCG/ACT inhaler, Inhale 2 puffs into the lungs every 6 (six) hours as needed for wheezing or shortness of breath., Disp: 18 g, Rfl: 1   amLODipine (NORVASC) 5 MG tablet, TAKE 1 TABLET(5 MG) BY MOUTH DAILY, Disp: 90 tablet, Rfl: 1   azelastine (ASTELIN) 0.1 % nasal spray, Place 1 spray into both nostrils 2 (two) times daily. Use in each nostril as directed, Disp: 30 mL, Rfl: 12   EPINEPHrine 0.3 mg/0.3 mL IJ SOAJ injection, Inject into upper thigh for severe allergic reaction, Disp: 2 each, Rfl: 1   fluticasone (FLONASE) 50 MCG/ACT nasal spray, Place 2 sprays into both nostrils daily. SHAKE LIQUID AND USE 2 SPRAYS IN EACH NOSTRIL EVERY DAY, Disp: 16 g, Rfl: 0   fluticasone furoate-vilanterol (BREO ELLIPTA) 100-25 MCG/ACT AEPB, Inhale 1 puff into the lungs daily., Disp: 1 each, Rfl: 11   guaiFENesin (MUCINEX) 600 MG 12 hr tablet, Take 2 tablets (1,200 mg total) by mouth 2 (two) times daily., Disp: 30 tablet, Rfl: 2   pantoprazole (PROTONIX) 40 MG tablet, Take 1 tablet (40 mg total) by mouth daily., Disp: 90 tablet, Rfl: 3   scopolamine (TRANSDERM-SCOP) 1 MG/3DAYS,  Place 1 patch (1.5 mg total) onto the skin every 3 (three) days., Disp: 10 patch, Rfl: 12   tacrolimus (PROTOPIC) 0.03 % ointment, Apply topically., Disp: , Rfl:    Vitamin D, Ergocalciferol, (DRISDOL) 1.25 MG (50000 UNIT) CAPS capsule, Take 1 capsule (50,000 Units total) by mouth every 7 (seven) days., Disp: 12 capsule, Rfl: 2  Observations/Objective: Patient is well-developed, well-nourished in no acute distress.  Resting comfortably at home.  Head is normocephalic, atraumatic.  No labored breathing.  Speech is clear and coherent with logical content. Vocal hoarseness is present Patient is alert and oriented at baseline.    Assessment and Plan: 1. Seasonal allergies - predniSONE (DELTASONE) 10 MG tablet; Directions for 6 day taper: Day 1: 2 tablets before breakfast, 1 after both lunch & dinner and 2 at bedtime Day 2: 1 tab before breakfast, 1 after both lunch & dinner and 2 at bedtime Day 3: 1 tab at each meal & 1 at bedtime Day 4: 1 tab at breakfast, 1 at lunch, 1 at bedtime Day 5: 1 tab at breakfast & 1 tab  at bedtime Day 6: 1 tab at breakfast  Dispense: 21 tablet; Refill: 0 - fluticasone (FLONASE) 50 MCG/ACT nasal spray; Place 2 sprays into both nostrils daily. SHAKE LIQUID AND USE 2 SPRAYS IN EACH NOSTRIL EVERY DAY  Dispense: 16 g; Refill: 0  2. Seasonal allergic rhinitis due to pollen - predniSONE (DELTASONE) 10 MG tablet; Directions for 6 day taper: Day 1: 2 tablets before breakfast, 1 after both lunch & dinner and 2 at bedtime Day 2: 1 tab before breakfast, 1 after both lunch & dinner and 2 at bedtime Day 3: 1 tab at each meal & 1 at bedtime Day 4: 1 tab at breakfast, 1 at lunch, 1 at bedtime Day 5: 1 tab at breakfast & 1 tab at bedtime Day 6: 1 tab at breakfast  Dispense: 21 tablet; Refill: 0   Follow Up Instructions: I discussed the assessment and treatment plan with the patient. The patient was provided an opportunity to ask questions and all were answered. The patient agreed with  the plan and demonstrated an understanding of the instructions.  A copy of instructions were sent to the patient via MyChart unless otherwise noted below.    The patient was advised to call back or seek an in-person evaluation if the symptoms worsen or if the condition fails to improve as anticipated.  Time:  I spent 12 minutes with the patient via telehealth technology discussing the above problems/concerns.    Gildardo Pounds, NP

## 2022-06-27 NOTE — Patient Instructions (Signed)
Pleas Ruth [name], thank you for joining Gildardo Pounds, NP for today's virtual visit.  While this provider is not your primary care provider (PCP), if your PCP is located in our provider database this encounter information will be shared with them immediately following your visit.   [facility] account gives you access to today's visit and all your visits, tests, and labs performed at [facility] " click here if you don't have a [facility] account or go to [email or web address]  Consent: (Patient) Ruth [name] provided verbal consent for this virtual visit at the beginning of the encounter.  Current Medications:  Current Outpatient Medications:    predniSONE (DELTASONE) 10 MG tablet, Directions for 6 day taper: Day 1: 2 tablets before breakfast, 1 after both lunch & dinner and 2 at bedtime Day 2: 1 tab before breakfast, 1 after both lunch & dinner and 2 at bedtime Day 3: 1 tab at each meal & 1 at bedtime Day 4: 1 tab at breakfast, 1 at lunch, 1 at bedtime Day 5: 1 tab at breakfast & 1 tab at bedtime Day 6: 1 tab at breakfast, Disp: 21 tablet, Rfl: 0   albuterol (VENTOLIN HFA) 108 (90 Base) MCG/ACT inhaler, Inhale 2 puffs into the lungs every 6 (six) hours as needed for wheezing or shortness of breath., Disp: 18 g, Rfl: 1   amLODipine (NORVASC) 5 MG tablet, TAKE 1 TABLET(5 MG) BY MOUTH DAILY, Disp: 90 tablet, Rfl: 1   azelastine (ASTELIN) 0.1 % nasal spray, Place 1 spray into both nostrils 2 (two) times daily. Use in each nostril as directed, Disp: 30 mL, Rfl: 12   EPINEPHrine 0.3 mg/0.3 mL IJ SOAJ injection, Inject into upper thigh for severe allergic reaction, Disp: 2 each, Rfl: 1   fluticasone (FLONASE) 50 MCG/ACT nasal spray, Place 2 sprays into both nostrils daily. SHAKE LIQUID AND USE 2 SPRAYS IN EACH NOSTRIL EVERY DAY, Disp: 16 g, Rfl: 0   fluticasone furoate-vilanterol (BREO ELLIPTA) 100-25 MCG/ACT AEPB, Inhale 1 puff into the lungs daily., Disp: 1  each, Rfl: 11   guaiFENesin (MUCINEX) 600 MG 12 hr tablet, Take 2 tablets (1,200 mg total) by mouth 2 (two) times daily., Disp: 30 tablet, Rfl: 2   pantoprazole (PROTONIX) 40 MG tablet, Take 1 tablet (40 mg total) by mouth daily., Disp: 90 tablet, Rfl: 3   scopolamine (TRANSDERM-SCOP) 1 MG/3DAYS, Place 1 patch (1.5 mg total) onto the skin every 3 (three) days., Disp: 10 patch, Rfl: 12   tacrolimus (PROTOPIC) 0.03 % ointment, Apply topically., Disp: , Rfl:    Vitamin D, Ergocalciferol, (DRISDOL) 1.25 MG (50000 UNIT) CAPS capsule, Take 1 capsule (50,000 Units total) by mouth every 7 (seven) days., Disp: 12 capsule, Rfl: 2   Medications ordered in this encounter:  Meds ordered this encounter  Medications   predniSONE (DELTASONE) 10 MG tablet    Sig: Directions for 6 day taper: Day 1: 2 tablets before breakfast, 1 after both lunch & dinner and 2 at bedtime Day 2: 1 tab before breakfast, 1 after both lunch & dinner and 2 at bedtime Day 3: 1 tab at each meal & 1 at bedtime Day 4: 1 tab at breakfast, 1 at lunch, 1 at bedtime Day 5: 1 tab at breakfast & 1 tab at bedtime Day 6: 1 tab at breakfast    Dispense:  21 tablet    Refill:  0    Order Specific Question:   Supervising Provider  Answer:   Chase Picket [id number]   fluticasone (FLONASE) 50 MCG/ACT nasal spray    Sig: Place 2 sprays into both nostrils daily. SHAKE LIQUID AND USE 2 SPRAYS IN EACH NOSTRIL EVERY DAY    Dispense:  16 g    Refill:  0    Order Specific Question:   Supervising Provider    Answer:   Chase Picket [id number]     *If you need refills on other medications prior to your next appointment, please contact your pharmacy*  Follow-Up: Call back or seek an in-person evaluation if the symptoms worsen or if the condition fails to improve as anticipated.  [facility] [phone]  Other Instructions Continue inhalers as prescribed as well as antihistamine and azelastine nasal spray   If you have been  instructed to have an in-person evaluation today at a local Urgent Care facility, please use the link below. It will take you to a list of all of our available [facility], including address, phone number and hours of operation. Please do not delay care.  [facility]  If you or a family member do not have a primary care provider, use the link below to schedule a visit and establish care. When you choose a [facility] primary care physician or advanced practice provider, you gain a long-term partner in health. Find a Primary Care Provider  Learn more about [facility]'s in-office and virtual care options: [facility]

## 2022-06-29 ENCOUNTER — Encounter: Payer: Self-pay | Admitting: Family Medicine

## 2022-06-29 DIAGNOSIS — J101 Influenza due to other identified influenza virus with other respiratory manifestations: Secondary | ICD-10-CM

## 2022-06-30 MED ORDER — GUAIFENESIN ER 600 MG PO TB12: 1200.0000 mg | ORAL_TABLET | Freq: Two times a day (BID) | ORAL | 2 refills | Status: DC

## 2022-07-08 LAB — HM MAMMOGRAPHY

## 2022-07-22 ENCOUNTER — Encounter: Payer: Self-pay | Admitting: Internal Medicine

## 2022-07-22 ENCOUNTER — Ambulatory Visit: Payer: Managed Care, Other (non HMO) | Attending: Internal Medicine | Admitting: Internal Medicine

## 2022-07-22 VITALS — BP 130/88 | HR 96 | Resp 14 | Ht 64.25 in | Wt 197.0 lb

## 2022-07-22 DIAGNOSIS — R591 Generalized enlarged lymph nodes: Secondary | ICD-10-CM | POA: Diagnosis not present

## 2022-07-22 DIAGNOSIS — D869 Sarcoidosis, unspecified: Secondary | ICD-10-CM | POA: Diagnosis not present

## 2022-07-22 DIAGNOSIS — E559 Vitamin D deficiency, unspecified: Secondary | ICD-10-CM

## 2022-07-22 DIAGNOSIS — J4541 Moderate persistent asthma with (acute) exacerbation: Secondary | ICD-10-CM

## 2022-07-22 DIAGNOSIS — R768 Other specified abnormal immunological findings in serum: Secondary | ICD-10-CM

## 2022-07-22 NOTE — Progress Notes (Signed)
Office Visit Note  Patient: Ruth [name]             Date of Birth: 12/29/1960           MRN: [id number]             PCP: Donato Schultz, DO Referring: Donato Schultz, * Visit Date: 07/22/2022 Occupation: Social worker  Subjective:  New Patient (Initial Visit) (Patient states she has pain in her hips, knees, and hands. Patient states she bottom of her feet feel tender. Patient states her hips and lower back get very stiff.)   History of Present Illness: Ruth [name] is a 62 y.o. female here for evaluation of possible sarcoidosis with cervical lymphadenopathy evaluated with excisional biopsy.  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  Lymph node swelling started in July of last year she did not recall any specific preceding major illness or medical event.  Her initial visit with PCP labs did have positive IgM and IgG for EBV but without known sick contact for infectious mononucleosis.  She was initially treated with Keflex and saw temporary improvement in symptoms but swelling has returned and been present continuously for at least the past 8 months.  Has been associated with some fatigue and had some tooth pain otherwise does not recall major change in symptoms otherwise.  Due to persistent lymphadenopathy she had CT imaging of the neck concerning for lymphoma and saw Dr. Myna Hidalgo with oncology.  Lab testing at the time showed a low positive ANA and she has a persistent elevated alkaline phosphatase attributed to gallbladder disease with multiple stones but minimal symptoms.  Subsequently went for cervical lymph node biopsy with ENT Dr. Jenne Pane this was negative for malignancy and suspicious for sarcoidosis or other reactive or autoimmune inflammatory process. She has chronic persistent moderate asthma on inhaler treatments and intermittently requires  systemic steroids last treatment in March due to exacerbation with 1 week of prednisone.  But has not noticed cough dyspnea chest pain or other respiratory complaints outside of the usual for her asthma and allergies. She has a long history of eczema this was severe as a child with mild persistent symptoms still ongoing.  There are a few areas of permanent residual hypopigmentation.  She had a few skin colored raised nodules along the corners of the mouth and on her back evaluated by dermatology last year with biopsy showing some interstitial granulomatous process. She has chronic joint pain and stiffness most often her legs with trouble from hip and knee pain that is worse trying to get up from the floor.  Occasionally gets pain in her hands and feet but without visible swelling.  She does not take any daily medication for joint pains. She denies any new visual changes eye pain or inflammation.  Does have chronically dry eyes but thinks this may be related to her allergies or antihistamines.  No new hair loss, no photosensitivity, no Raynaud's symptoms.  02/05/22 CT Neck IMPRESSION: Widespread lymphadenopathy throughout all cervical nodal stations, symmetric from right to left. This includes enlargement of intraparotid lymph nodes. No single dominant node or necrotic node. Bilateral axillary lymphadenopathy also present. 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 Possible reactive nodal enlargement to some sort of systemic infection or autoimmune disease is also possible.  Activities of Daily Living:  Patient reports morning stiffness for 30-45 minutes.   Patient Reports nocturnal pain.  Difficulty dressing/grooming: Denies Difficulty climbing stairs: Denies Difficulty getting out of chair: Denies Difficulty using hands for taps, buttons, cutlery, and/or writing: Reports  Review of Systems  Constitutional:  Positive for fatigue.  HENT:  Negative for mouth sores and mouth dryness.   Eyes:   Positive for dryness.  Respiratory:  Negative for shortness of breath.   Cardiovascular:  Negative for chest pain and palpitations.  Gastrointestinal:  Negative for blood in stool, constipation and diarrhea.  Endocrine: Negative for increased urination.  Genitourinary:  Negative for involuntary urination.  Musculoskeletal:  Positive for joint pain, gait problem, joint pain, myalgias, muscle weakness, morning stiffness, muscle tenderness and myalgias. Negative for joint swelling.  Skin:  Negative for color change, rash, hair loss and sensitivity to sunlight.  Allergic/Immunologic: Negative for susceptible to infections.  Neurological:  Positive for dizziness. Negative for headaches.  Hematological:  Positive for swollen glands.  Psychiatric/Behavioral:  Positive for sleep disturbance. Negative for depressed mood. The patient is nervous/anxious.     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/07/2020   Preventative health care 07/07/2020   SAH (subarachnoid hemorrhage) 07/21/2019   RUQ pain 06/18/2019   Dyspepsia 06/18/2019   Moderate persistent asthma with acute exacerbation 03/05/2019   Vitamin D deficiency 07/03/2018   Depression 07/03/2018   Class 2 severe obesity with serious comorbidity and body mass index (BMI) of 37.0 to 37.9 in adult 07/03/2018   Essential hypertension 09/19/2017   Aneurysm, cerebral, nonruptured 05/06/2015   White matter abnormality on MRI of brain 05/06/2015   Migraine headache without aura 05/06/2015   Dizziness 05/06/2015   Foreign body of ear, left 09/05/2012   HTN (hypertension) 06/12/2012   Allergic rhinitis due to pollen 12/27/2007   Allergic-infective asthma 08/29/2006   ECZEMA 08/29/2006    Past Medical History:   Diagnosis Date   Allergic rhinitis, cause unspecified    Asthma    Eczema    Food allergy    Gallbladder problem    Headache    Hypertension     Family History  Problem Relation Age of Onset   Asthma Mother    Allergies Mother    Asthma Father    Drug abuse Father    Allergies Father    Past Surgical History:  Procedure Laterality Date   CESAREAN SECTION     CESAREAN SECTION Bilateral [id number]   Social History   Social History Narrative   Exercise--no   Right handed   Caffiene1 cup   Lives alone and a two story home   Immunization History  Administered Date(s) Administered   PFIZER(Purple Top)SARS-COV-2 Vaccination 10/19/2019, 11/09/2019   Td 05/27/2012     Objective: Vital Signs: BP 130/88 (BP Location: Right Arm, Patient Position: Sitting, Cuff Size: Normal)   Pulse 96   Resp 14   Ht 5' 4.25" (1.632 m)   Wt 197 lb (89.4 kg)   LMP 09/19/2015   BMI 33.55 kg/m    Physical Exam Constitutional:      Appearance: She is obese.  Eyes:     Conjunctiva/sclera: Conjunctivae normal.  Neck:     Comments: Multiple large, firm nontender lymph nodes throughout from preauricular and anterior and posterior cervical chain No axillary lymphadenopathy palpable Cardiovascular:     Rate and Rhythm: Normal rate  and regular rhythm.  Pulmonary:     Effort: Pulmonary effort is normal.     Breath sounds: Normal breath sounds.  Musculoskeletal:     Right lower leg: No edema.     Left lower leg: No edema.  Lymphadenopathy:     Cervical: Cervical adenopathy present.  Skin:    General: Skin is warm and dry.  Neurological:     General: No focal deficit present.     Mental Status: She is alert.     Motor: No weakness.  Psychiatric:        Mood and Affect: Mood normal.      Musculoskeletal Exam:  Shoulders full ROM no tenderness or swelling Elbows full ROM no tenderness or swelling Wrists full ROM no tenderness or swelling Fingers full ROM no tenderness or  swelling Hip normal internal and external rotation without pain, no tenderness to lateral hip palpation Knees full ROM no tenderness or swelling, bilateral patellofemoral crepitus present Ankles full ROM no tenderness or swelling   Investigation: No additional findings.  Imaging: No results found.  Recent Labs: Lab Results  Component Value Date   WBC 5.1 02/16/2022   HGB 13.1 02/16/2022   PLT 240 02/16/2022   NA 138 02/16/2022   K 3.8 02/16/2022   CL 104 02/16/2022   CO2 23 02/16/2022   GLUCOSE 91 02/16/2022   BUN 13 02/16/2022   CREATININE 0.93 02/16/2022   BILITOT 0.8 02/16/2022   ALKPHOS 221 (H) 02/16/2022   AST 36 02/16/2022   ALT 28 02/16/2022   PROT 8.7 (H) 02/16/2022   ALBUMIN 4.0 02/16/2022   CALCIUM 9.4 02/16/2022   GFRAA >60 07/21/2019    Speciality Comments: No specialty comments available.  Procedures:  No procedures performed Allergies: Other and Chlorhexidine gluconate   Assessment / Plan:     Visit Diagnoses: Sarcoidosis  Moderate persistent asthma with acute exacerbation Positive ANA (antinuclear antibody) - Plan: Anti-Smith antibody, Sjogrens syndrome-A extractable nuclear antibody, C3 and C4, Rheumatoid factor, Angiotensin converting enzyme, CANCELED: Angiotensin converting enzyme  Extensive lymphadenopathy in the cervical region otherwise no objective inflammatory findings on exam.  The granulomatous dermatitis lesions she had biopsy by dermatology could represent cutaneous sarcoidosis manifestation.  Does not have apparent pulmonary involvement from any change in symptoms or findings so far although with her chronic moderate asthma could make mild symptoms difficult to identify.  Checking lab workup for evidence of systemic disease as above.  Also checking SSA Smith antibodies and complements though I expect these are more likely normal with the low positive ANA.  Unless unexpected findings we will recommend trial of initial moderate dose prednisone  course with symptom reassessment in 6 to 8 weeks for therapeutic and diagnostic treatment.  Lymphadenopathy  Extensive cervical adenopathy suspicious for sarcoidosis.  The positive EBV IgM for suspected mononucleosis infection last year also a possibility for reactive adenopathy.  However this would be unusual to persist symptoms greater than 8 months duration.  Vitamin D deficiency - Plan: Vitamin D 1,25 dihydroxy, VITAMIN D 25 Hydroxy (Vit-D Deficiency, Fractures)  She currently supplements vitamin D with 5000 unit daily.  Will check vitamin D stores and 125 dihydroxy vitamin D for any evidence of dysregulation with suspected sarcoidosis diagnosis.   Orders: Orders Placed This Encounter  Procedures   Anti-Smith antibody   Sjogrens syndrome-A extractable nuclear antibody   C3 and C4   Rheumatoid factor   Vitamin D 1,25 dihydroxy   VITAMIN D 25 Hydroxy (Vit-D Deficiency, Fractures)  Angiotensin converting enzyme   No orders of the defined types were placed in this encounter.    Follow-Up Instructions: Return in about 6 weeks (around 09/02/2022) for Sarcoidosis GC f/u 6wks.   Fuller Plan, MD  Note - This record has been created using [employer].  Chart creation errors have been sought, but may not always  have been located. Such creation errors do not reflect on  the standard of medical care.

## 2022-07-23 LAB — RHEUMATOID FACTOR: Rheumatoid fact SerPl-aCnc: 10 IU/mL (ref <14)

## 2022-07-23 LAB — ANGIOTENSIN CONVERTING ENZYME: Angiotensin-Converting Enzyme: 148 U/L — ABNORMAL HIGH (ref 9–67)

## 2022-07-25 LAB — VITAMIN D 1,25 DIHYDROXY
Vitamin D 1, 25 (OH)2 Total: 110 pg/mL — ABNORMAL HIGH (ref 18–72)
Vitamin D2 1, 25 (OH)2: 86 pg/mL
Vitamin D3 1, 25 (OH)2: 24 pg/mL

## 2022-07-25 LAB — ANTI-SMITH ANTIBODY: ENA SM Ab Ser-aCnc: <1 AI

## 2022-07-25 LAB — C3 AND C4
C3 Complement: 176 mg/dL (ref 83–193)
C4 Complement: 40 mg/dL (ref 15–57)

## 2022-07-25 LAB — VITAMIN D 25 HYDROXY (VIT D DEFICIENCY, FRACTURES): Vit D, 25-Hydroxy: 69 ng/mL (ref 30–100)

## 2022-07-25 LAB — SJOGRENS SYNDROME-A EXTRACTABLE NUCLEAR ANTIBODY: SSA (Ro) (ENA) Antibody, IgG: <1 AI

## 2022-07-26 MED ORDER — PREDNISONE 20 MG PO TABS
20.0000 mg | ORAL_TABLET | Freq: Every day | ORAL | 0 refills | Status: DC
Start: 2022-07-26 — End: 2022-08-31

## 2022-07-26 NOTE — Progress Notes (Signed)
Lab results look consistent with sarcoidosis. The ACE level is high at 148 this is not specific to sarcoidosis but usually seen from this disease when active. Her activated vitamin D is high at 110 as well this is also an effect of sarcoidosis.  Initial treatment would be with oral steroid medication like we discussed and I will send a 6 week prescription for this we can recheck when we follow up.

## 2022-07-26 NOTE — Addendum Note (Signed)
Addended by: Fuller Plan on: 07/26/2022 08:58 AM   Modules accepted: Orders

## 2022-08-30 ENCOUNTER — Ambulatory Visit: Payer: Managed Care, Other (non HMO) | Admitting: Internal Medicine

## 2022-08-31 ENCOUNTER — Other Ambulatory Visit: Payer: Self-pay | Admitting: Family Medicine

## 2022-08-31 ENCOUNTER — Ambulatory Visit: Payer: Managed Care, Other (non HMO) | Attending: Internal Medicine | Admitting: Internal Medicine

## 2022-08-31 ENCOUNTER — Encounter: Payer: Managed Care, Other (non HMO) | Admitting: Internal Medicine

## 2022-08-31 ENCOUNTER — Encounter: Payer: Self-pay | Admitting: Internal Medicine

## 2022-08-31 VITALS — BP 132/85 | HR 89 | Resp 16 | Ht 65.0 in | Wt 206.0 lb

## 2022-08-31 DIAGNOSIS — R252 Cramp and spasm: Secondary | ICD-10-CM | POA: Diagnosis not present

## 2022-08-31 DIAGNOSIS — E559 Vitamin D deficiency, unspecified: Secondary | ICD-10-CM

## 2022-08-31 DIAGNOSIS — H5210 Myopia, unspecified eye: Secondary | ICD-10-CM | POA: Insufficient documentation

## 2022-08-31 DIAGNOSIS — H5213 Myopia, bilateral: Secondary | ICD-10-CM

## 2022-08-31 DIAGNOSIS — D869 Sarcoidosis, unspecified: Secondary | ICD-10-CM | POA: Diagnosis not present

## 2022-08-31 DIAGNOSIS — I10 Essential (primary) hypertension: Secondary | ICD-10-CM

## 2022-08-31 DIAGNOSIS — Z79899 Other long term (current) drug therapy: Secondary | ICD-10-CM

## 2022-08-31 DIAGNOSIS — R591 Generalized enlarged lymph nodes: Secondary | ICD-10-CM | POA: Diagnosis not present

## 2022-08-31 LAB — CBC WITH DIFFERENTIAL/PLATELET
Hemoglobin: 14 g/dL (ref 11.7–15.5)
Neutrophils Relative %: 86.3 %

## 2022-08-31 MED ORDER — METHOTREXATE SODIUM 2.5 MG PO TABS
15.0000 mg | ORAL_TABLET | ORAL | 0 refills | Status: DC
Start: 2022-08-31 — End: 2022-10-08

## 2022-08-31 MED ORDER — FOLIC ACID 1 MG PO TABS
1.0000 mg | ORAL_TABLET | Freq: Every day | ORAL | 0 refills | Status: DC
Start: 2022-08-31 — End: 2022-10-14

## 2022-08-31 MED ORDER — PREDNISONE 5 MG PO TABS
ORAL_TABLET | ORAL | 0 refills | Status: DC
Start: 2022-08-31 — End: 2022-10-08

## 2022-08-31 NOTE — Progress Notes (Signed)
Office Visit Note  Patient: Ruth [name]             Date of Birth: 08/22/1960           MRN: [id number]             PCP: Donato Schultz, DO Referring: Donato Schultz, * Visit Date: 08/31/2022   Subjective:  Follow-up (Patient states she is having a lot of muscle cramps around her calves. Patient states she is having toe cramping. Patient states last week was the worst.)   History of Present Illness: Ruth [name] is a 62 y.o. female here for follow up for sarcoidosis with fatigue and bulky lymphadenopathy now on prednisone 20 mg daily.  She has seen improvement of the large lymph node swelling.  Has noticed several side effects with unintentional weight gain, blurry vision, and cramping in her calfs and toes.  She has been trying to moderate her diet but definitely still increased appetite and becoming very tired shortly after eating.  Joint pain and stiffness including low back pain is doing great on the steroids.  Previous HPI 07/22/22 Ruth [name] is a 62 y.o. female here for evaluation of possible sarcoidosis with cervical lymphadenopathy evaluated with excisional biopsy.  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  Lymph node swelling started in July of last year she did not recall any specific preceding major illness or medical event.  Her initial visit with PCP labs did have positive IgM and IgG for EBV but without known sick contact for infectious mononucleosis.  She was initially treated with Keflex and saw temporary improvement in symptoms but swelling has returned and been present continuously for at least the past 8 months.  Has been associated with some fatigue and had some tooth pain otherwise does not recall major change in symptoms otherwise.  Due to persistent lymphadenopathy she had CT imaging of the neck concerning for lymphoma  and saw Dr. Myna Hidalgo with oncology.  Lab testing at the time showed a low positive ANA and she has a persistent elevated alkaline phosphatase attributed to gallbladder disease with multiple stones but minimal symptoms.  Subsequently went for cervical lymph node biopsy with ENT Dr. Jenne Pane this was negative for malignancy and suspicious for sarcoidosis or other reactive or autoimmune inflammatory process. She has chronic persistent moderate asthma on inhaler treatments and intermittently requires systemic steroids last treatment in March due to exacerbation with 1 week of prednisone.  But has not noticed cough dyspnea chest pain or other respiratory complaints outside of the usual for her asthma and allergies. She has a long history of eczema this was severe as a child with mild persistent symptoms still ongoing.  There are a few areas of permanent residual hypopigmentation.  She had a few skin colored raised nodules along the corners of the mouth and on her back evaluated by dermatology last year with biopsy showing some interstitial granulomatous process. She has chronic joint pain and stiffness most often her legs with trouble from hip and knee pain that is worse trying to get up from the floor.  Occasionally gets pain in her hands and feet but without visible swelling.  She does not take any daily medication for joint pains. She denies any new visual changes eye pain or inflammation.  Does have chronically dry eyes but thinks this may be related to her allergies or antihistamines.  No new hair loss, no photosensitivity, no Raynaud's symptoms.   02/05/22 CT Neck IMPRESSION: Widespread lymphadenopathy throughout all cervical nodal stations, symmetric from right to left. This includes enlargement of intraparotid lymph nodes. No single dominant node or necrotic node. Bilateral axillary lymphadenopathy also present. 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 Possible reactive nodal enlargement to some sort of  systemic infection or autoimmune disease is also possible.   Review of Systems  Constitutional:  Positive for fatigue.  HENT:  Negative for mouth sores and mouth dryness.   Eyes:  Positive for dryness.  Respiratory:  Negative for shortness of breath.   Cardiovascular:  Negative for chest pain and palpitations.  Gastrointestinal:  Negative for blood in stool, constipation and diarrhea.  Endocrine: Negative for increased urination.  Genitourinary:  Negative for involuntary urination.  Musculoskeletal:  Positive for joint pain, joint pain, myalgias, muscle weakness, morning stiffness, muscle tenderness and myalgias. Negative for gait problem and joint swelling.  Skin:  Negative for color change, rash, hair loss and sensitivity to sunlight.  Allergic/Immunologic: Negative for susceptible to infections.  Neurological:  Positive for headaches. Negative for dizziness.  Hematological:  Positive for swollen glands.  Psychiatric/Behavioral:  Positive for sleep disturbance. Negative for depressed mood. The patient is nervous/anxious.     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   Preventative health care 07/07/2020   SAH (subarachnoid hemorrhage) (HCC) 07/21/2019   RUQ pain 06/18/2019   Dyspepsia 06/18/2019   Moderate persistent asthma with acute exacerbation 03/05/2019   Vitamin D deficiency 07/03/2018   Depression 07/03/2018   Class 2 severe obesity with serious comorbidity and body mass index (BMI) of 37.0 to 37.9 in adult (HCC) 07/03/2018   Essential hypertension 09/19/2017   Aneurysm, cerebral, nonruptured 05/06/2015   White matter abnormality  on MRI of brain 05/06/2015   Migraine headache without aura 05/06/2015   Dizziness 05/06/2015   Foreign body of ear, left 09/05/2012   HTN (hypertension) 06/12/2012   Allergic rhinitis due to pollen 12/27/2007   Allergic-infective asthma 08/29/2006   ECZEMA 08/29/2006    Past Medical History:  Diagnosis Date   Allergic rhinitis, cause unspecified    Asthma    Eczema    Food allergy    Gallbladder problem    Headache    Hypertension     Family History  Problem Relation Age of Onset   Asthma Mother    Allergies Mother    Asthma Father    Drug abuse Father    Allergies Father    Past Surgical History:  Procedure Laterality Date   CESAREAN SECTION Bilateral 16109604   Social History   Social History Narrative   Exercise--no   Right handed   Caffiene1 cup   Lives alone and a two story home   Immunization History  Administered Date(s) Administered   PFIZER(Purple Top)SARS-COV-2 Vaccination 10/19/2019, 11/09/2019   Td 05/27/2012     Objective: Vital Signs: BP 132/85 (BP Location: Left Arm, Patient Position: Sitting, Cuff Size: Normal)   Pulse 89   Resp 16   Ht 5\' 5"  (1.651 m)   Wt 206 lb (93.4 kg)   LMP 09/19/2015   BMI  34.28 kg/m    Physical Exam Constitutional:      Appearance: She is obese.  Eyes:     Conjunctiva/sclera: Conjunctivae normal.  Cardiovascular:     Rate and Rhythm: Normal rate and regular rhythm.  Pulmonary:     Effort: Pulmonary effort is normal.     Breath sounds: Normal breath sounds.  Lymphadenopathy:     Cervical: Cervical adenopathy (Large bilateral cervical adenopathy, nontender, no palpable swelling of major salivary glands) present.  Skin:    General: Skin is warm and dry.  Neurological:     Mental Status: She is alert.  Psychiatric:        Mood and Affect: Mood normal.      Musculoskeletal Exam:  Shoulders full ROM no tenderness or swelling Elbows full ROM no tenderness or swelling Wrists full ROM no tenderness or  swelling Fingers full ROM no tenderness or swelling Knees full ROM no tenderness or swelling Ankles full ROM no tenderness or swelling   Investigation: No additional findings.  Imaging: No results found.  Recent Labs: Lab Results  Component Value Date   WBC 5.1 02/16/2022   HGB 13.1 02/16/2022   PLT 240 02/16/2022   NA 138 02/16/2022   K 3.8 02/16/2022   CL 104 02/16/2022   CO2 23 02/16/2022   GLUCOSE 91 02/16/2022   BUN 13 02/16/2022   CREATININE 0.93 02/16/2022   BILITOT 0.8 02/16/2022   ALKPHOS 221 (H) 02/16/2022   AST 36 02/16/2022   ALT 28 02/16/2022   PROT 8.7 (H) 02/16/2022   ALBUMIN 4.0 02/16/2022   CALCIUM 9.4 02/16/2022   GFRAA >60 07/21/2019    Speciality Comments: No specialty comments available.  Procedures:  No procedures performed Allergies: Other and Chlorhexidine gluconate   Assessment / Plan:     Visit Diagnoses: Sarcoidosis - Plan: Vitamin D 1,25 dihydroxy, Angiotensin converting enzyme, methotrexate (RHEUMATREX) 2.5 MG tablet, folic acid (FOLVITE) 1 MG tablet, predniSONE (DELTASONE) 5 MG tablet  Symptoms appear to be progressing she is getting side effects with the prednisone including weight gain and may be some others.  Will recheck labs for assessing change in disease activity.  Will get chest x-ray for assessing lung activity and baseline for planned methotrexate start.  Labs okay recommend starting methotrexate 15 mg p.o. weekly folic acid 1 mg daily.  Can also start prednisone tapering down to 15 mg daily then 5 mg increments every 2 weeks until maintaining 5 mg or if symptoms increase.  Lymphadenopathy  Bulky lymphadenopathy is partially improved on exam today.  Still has a significant amount palpable in anterior cervical preauricular and supraclavicular areas.  Cramps of lower extremity  Not sure about the mechanism for cramps could be electrolyte related possible calcium or potassium effect with steroids and treating the sarcoidosis.   Checking metabolic panel as above.  If normal would otherwise just recommend watching and symptomatic management.  Myopia of both eyes  Mild vision complaint possible more nearsightedness.  If she is getting significant hyperglycemia with the steroids that would be effective.  Does not sound typical for uveitis or iritis with no visible inflammation no pain or persistent vision change.  If this gets worse would need additional ophthalmology exam to rule out ocular involvement.  High risk medication use - Plan: CBC with Differential/Platelet, COMPLETE METABOLIC PANEL WITH GFR, Thiopurine methyltransferase(tpmt)rbc  Checking CBC and CMP and TPMT for medication monitoring discussing addition of methotrexate as steroid sparing medication.  Discussed risk of the medication including cytopenias,  hepatotoxicity, GI intolerance, or infection risk.  Also considering possible azathioprine as alternative if needed if TPMT appropriate.  Vitamin D deficiency  Rechecking activated vitamin D level that was previously elevated consistent with peripheral conversion in setting of active sarcoidosis.  Orders: Orders Placed This Encounter  Procedures   Vitamin D 1,25 dihydroxy   Angiotensin converting enzyme   CBC with Differential/Platelet   COMPLETE METABOLIC PANEL WITH GFR   Thiopurine methyltransferase(tpmt)rbc   Meds ordered this encounter  Medications   methotrexate (RHEUMATREX) 2.5 MG tablet    Sig: Take 6 tablets (15 mg total) by mouth once a week. Caution:Chemotherapy. Protect from light.    Dispense:  30 tablet    Refill:  0   folic acid (FOLVITE) 1 MG tablet    Sig: Take 1 tablet (1 mg total) by mouth daily.    Dispense:  90 tablet    Refill:  0   predniSONE (DELTASONE) 5 MG tablet    Sig: Take 3 tablets (15 mg total) by mouth daily with breakfast for 14 days, THEN 2 tablets (10 mg total) daily with breakfast for 14 days, THEN 1 tablet (5 mg total) daily with breakfast for 14 days.     Dispense:  84 tablet    Refill:  0     Follow-Up Instructions: Return in about 4 weeks (around 09/28/2022) for Sarcoidosis MTX start/GC taper f/u 72mo.   Fuller Plan, MD  Note - This record has been created using [employer].  Chart creation errors have been sought, but may not always  have been located. Such creation errors do not reflect on  the standard of medical care.

## 2022-08-31 NOTE — Patient Instructions (Signed)
Methotrexate Tablets What is this medication? METHOTREXATE (METH oh TREX ate) treats autoimmune conditions, such as arthritis and psoriasis. It works by decreasing inflammation, which can reduce pain and prevent long-term injury to the joints and skin. It may also be used to treat some types of cancer. It works by slowing down the growth of cancer cells. This medicine may be used for other purposes; ask your health care provider or pharmacist if you have questions. COMMON BRAND NAME(S): Rheumatrex, Trexall What should I tell my care team before I take this medication? They need to know if you have any of these conditions: Dehydration Diabetes Fluid in the stomach area or lungs Frequently drink alcohol Having surgery, including dental surgery High cholesterol Immune system problems Inflammatory bowel disease, such as ulcerative colitis Kidney disease Liver disease Low blood cell levels (white cells, red cells, and platelets) Lung disease Recent or ongoing radiation Recent or upcoming vaccine Stomach ulcers, other stomach or intestine problems An unusual or allergic reaction to methotrexate, other medications, foods, dyes, or preservatives Pregnant or trying to get pregnant Breastfeeding How should I use this medication? Take this medication by mouth with water. Take it as directed on the prescription label. Do not take extra. Keep taking this medication until your care team tells you to stop. Know why you are taking this medication and how you should take it. To treat conditions such as arthritis and psoriasis, this medication is taken ONCE A WEEK as a single dose or divided into 3 smaller doses taken 12 hours apart (do not take more than 3 doses 12 hours apart each week). This medication is NEVER taken daily to treat conditions other than cancer. Taking this medication more often than directed can cause serious side effects, even death. Talk to your care team about why you are taking this  medication, how often you will take it, and what your dose is. Ask your care team to put the reason you take this medication on the prescription. If you take this medication ONCE A WEEK, choose a day of the week before you start. Ask your pharmacist to include the day of the week on the label. Avoid "Monday", which could be misread as "Morning". Handling this medication may be harmful. Talk to your care team about how to handle this medication. Special instructions may apply. Talk to your care team about the use of this medication in children. While it may be prescribed for selected conditions, precautions do apply. Overdosage: If you think you have taken too much of this medicine contact a poison control center or emergency room at once. NOTE: This medicine is only for you. Do not share this medicine with others. What if I miss a dose? If you miss a dose, talk with your care team. Do not take double or extra doses. What may interact with this medication? Do not take this medication with any of the following: Acitretin Live virus vaccines Probenecid This medication may also interact with the following: Alcohol Aspirin and aspirin-like medications Certain antibiotics, such as penicillin, neomycin, sulfamethoxazole; trimethoprim Certain medications for stomach problems, such as lansoprazole, omeprazole, pantoprazole Clozapine Cyclosporine Dapsone Folic acid Foscarnet NSAIDs, medications for pain and inflammation, such as ibuprofen or naproxen Phenytoin Pyrimethamine Steroid medications, such as prednisone or cortisone Tacrolimus Theophylline This list may not describe all possible interactions. Give your health care provider a list of all the medicines, herbs, non-prescription drugs, or dietary supplements you use. Also tell them if you smoke, drink alcohol, or use   illegal drugs. Some items may interact with your medicine. What should I watch for while using this medication? Visit your  care team for regular checks on your progress. It may be some time before you see the benefit from this medication. You may need blood work done while you are taking this medication. If your care team has also prescribed folic acid, they may instruct you to skip your folic acid dose on the day you take methotrexate. This medication can make you more sensitive to the sun. Keep out of the sun. If you cannot avoid being in the sun, wear protective clothing and sunscreen. Do not use sun lamps, tanning beds, or tanning booths. Check with your care team if you have severe diarrhea, nausea, and vomiting, or if you sweat a lot. The loss of too much body fluid may make it dangerous for you to take this medication. This medication may increase your risk of getting an infection. Call your care team for advice if you get a fever, chills, sore throat, or other symptoms of a cold or flu. Do not treat yourself. Try to avoid being around people who are sick. Talk to your care team about your risk of cancer. You may be more at risk for certain types of cancers if you take this medication. Talk to your care team if you or your partner may be pregnant. Serious birth defects can occur if you take this medication during pregnancy and for 6 months after the last dose. You will need a negative pregnancy test before starting this medication. Contraception is recommended while taking this medication and for 6 months after the last dose. Your care team can help you find the option that works for you. If your partner can get pregnant, use a condom during sex while taking this medication and for 3 months after the last dose. Do not breastfeed while taking this medication and for 1 week after the last dose. This medication may cause infertility. Talk to your care team if you are concerned about your fertility. What side effects may I notice from receiving this medication? Side effects that you should report to your care team as soon  as possible: Allergic reactions--skin rash, itching, hives, swelling of the face, lips, tongue, or throat Dry cough, shortness of breath or trouble breathing Infection--fever, chills, cough, sore throat, wounds that don't heal, pain or trouble when passing urine, general feeling of discomfort or being unwell Kidney injury--decrease in the amount of urine, swelling of the ankles, hands, or feet Liver injury--right upper belly pain, loss of appetite, nausea, light-colored stool, dark yellow or brown urine, yellowing skin or eyes, unusual weakness or fatigue Low red blood cell level--unusual weakness or fatigue, dizziness, headache, trouble breathing Pain, tingling, or numbness in the hands or feet, muscle weakness, change in vision, confusion or trouble speaking, loss of balance or coordination, trouble walking, seizures Redness, blistering, peeling, or loosening of the skin, including inside the mouth Stomach bleeding--bloody or black, tar-like stools, vomiting blood or brown material that looks like coffee grounds Stomach pain that is severe, does not away, or gets worse Unusual bruising or bleeding Side effects that usually do not require medical attention (report these to your care team if they continue or are bothersome): Diarrhea Dizziness Hair loss Nausea Pain, redness, or swelling with sores inside the mouth or throat Skin reactions on sun-exposed areas Vomiting This list may not describe all possible side effects. Call your doctor for medical advice about side effects. You   may report side effects to FDA at 1-800-FDA-1088. Where should I keep my medication? Keep out of the reach of children and pets. Store at room temperature between 20 and 25 degrees C (68 and 77 degrees F). Protect from light. Keep the container tightly closed. Get rid of any unused medication after the expiration date. To get rid of medications that are no longer needed or have expired: Take the medication to a  medication take-back program. Check with your pharmacy or law enforcement to find a location. If you cannot return the medication, ask your pharmacist or care team how to get rid of this medication safely. NOTE: This sheet is a summary. It may not cover all possible information. If you have questions about this medicine, talk to your doctor, pharmacist, or health care provider.  2024 Elsevier/Gold Standard (2022-05-23 00:00:00)  

## 2022-09-01 ENCOUNTER — Other Ambulatory Visit: Payer: Self-pay | Admitting: *Deleted

## 2022-09-01 ENCOUNTER — Telehealth: Payer: Self-pay | Admitting: Internal Medicine

## 2022-09-01 DIAGNOSIS — Z79899 Other long term (current) drug therapy: Secondary | ICD-10-CM

## 2022-09-01 DIAGNOSIS — D869 Sarcoidosis, unspecified: Secondary | ICD-10-CM

## 2022-09-01 LAB — COMPLETE METABOLIC PANEL WITH GFR
ALT: 23 U/L (ref 6–29)
Alkaline phosphatase (APISO): 175 U/L — ABNORMAL HIGH (ref 37–153)
Potassium: 3.9 mmol/L (ref 3.5–5.3)

## 2022-09-01 LAB — CBC WITH DIFFERENTIAL/PLATELET
Absolute Monocytes: 98 cells/uL — ABNORMAL LOW (ref 200–950)
HCT: 41.8 % (ref 35.0–45.0)
MCHC: 33.5 g/dL (ref 32.0–36.0)
MCV: 85.1 fL (ref 80.0–100.0)

## 2022-09-01 LAB — ANGIOTENSIN CONVERTING ENZYME: Angiotensin-Converting Enzyme: 70 U/L — ABNORMAL HIGH (ref 9–67)

## 2022-09-01 NOTE — Telephone Encounter (Signed)
Pt is calling in stating that she is not able to get her xray done due to they are not able to see the xray orders.  Pt is needing clarification about the new medication that she is to start.  Pt is needing something faxed to the eye doctor about exactly what Dr. Dimple Casey is looking for an she has an appointment on 09/02/2022 at 3:30 at [facility] on [address] [facility] in [place].  Pt would like to have a call back.

## 2022-09-01 NOTE — Telephone Encounter (Signed)
I called patient, per Dr Dimple Casey, Chest x-ray ordered, records faxed to [facility] in [facility].

## 2022-09-03 ENCOUNTER — Ambulatory Visit (HOSPITAL_BASED_OUTPATIENT_CLINIC_OR_DEPARTMENT_OTHER)
Admission: RE | Admit: 2022-09-03 | Discharge: 2022-09-03 | Disposition: A | Discharge status: Home / Self Care | Payer: Managed Care, Other (non HMO) | Source: Ambulatory Visit | Attending: Internal Medicine

## 2022-09-03 DIAGNOSIS — Z79899 Other long term (current) drug therapy: Secondary | ICD-10-CM | POA: Insufficient documentation

## 2022-09-03 DIAGNOSIS — D869 Sarcoidosis, unspecified: Secondary | ICD-10-CM | POA: Insufficient documentation

## 2022-09-04 LAB — COMPLETE METABOLIC PANEL WITH GFR
Sodium: 139 mmol/L (ref 135–146)
Total Protein: 8.1 g/dL (ref 6.1–8.1)
eGFR: 79 mL/min/{1.73_m2} (ref >=60)

## 2022-09-04 LAB — CBC WITH DIFFERENTIAL/PLATELET
MCH: 28.5 pg (ref 27.0–33.0)
Neutro Abs: 6473 cells/uL (ref 1500–7800)
RBC: 4.91 10*6/uL (ref 3.80–5.10)
WBC: 7.5 10*3/uL (ref 3.8–10.8)

## 2022-09-04 LAB — VITAMIN D 1,25 DIHYDROXY
Vitamin D 1, 25 (OH)2 Total: 96 pg/mL — ABNORMAL HIGH (ref 18–72)
Vitamin D3 1, 25 (OH)2: 33 pg/mL

## 2022-09-06 ENCOUNTER — Telehealth: Payer: Self-pay

## 2022-09-06 NOTE — Telephone Encounter (Signed)
Patient advised per Dr. Dimple Casey That is fine to delay starting methotrexate for a week. Some people do experience fatigue or GI symptoms when starting this although many do not. Dr. Dimple Casey reviewed her labs in associated result note from last visit, numbers are partially improved so far with the steroids. Patient verbalized understanding.

## 2022-09-06 NOTE — Telephone Encounter (Signed)
Patient states she was supposed to start the methotrexate this past weekend but she has had a family emergency and is at the hospital with her daughter. Patient states she has read the side effects and does not want to start the methotrexate while she is at the hospital trying to help her daughter. Patient inquires if she can start the methotrexate this coming weekend. Patient call back number is [phone]. Please advise.

## 2022-09-06 NOTE — Progress Notes (Signed)
The high vitamin D level improved from 110 down to 96. ACE level improved from 148 to 70 that is almost normal now. Total protein improved from 8.7 to 8.1. Overall this is a lot better although not all the way down to normal.  Her monocytes went down to 98 and eosinophils to 0 this is due to the prednisone.  I see no problem for her starting methotrexate and can start gradually decreasing the prednisone.

## 2022-09-06 NOTE — Telephone Encounter (Signed)
That is fine to delay starting methotrexate for a week. Some people do experience fatigue or GI symptoms when starting this although many do not. I reviewed her labs in associated result note from last visit, numbers are partially improved so far with the steroids.

## 2022-09-08 LAB — CBC WITH DIFFERENTIAL/PLATELET
Basophils Absolute: 23 cells/uL (ref 0–200)
Basophils Relative: 0.3 %
Eosinophils Absolute: 0 cells/uL — ABNORMAL LOW (ref 15–500)
Eosinophils Relative: 0 %
Lymphs Abs: 908 cells/uL (ref 850–3900)
MPV: 10.1 fL (ref 7.5–12.5)
Monocytes Relative: 1.3 %
Platelets: 274 10*3/uL (ref 140–400)
RDW: 13.7 % (ref 11.0–15.0)
Total Lymphocyte: 12.1 %

## 2022-09-08 LAB — COMPLETE METABOLIC PANEL WITH GFR
AG Ratio: 1 (calc) (ref 1.0–2.5)
AST: 17 U/L (ref 10–35)
Albumin: 4.1 g/dL (ref 3.6–5.1)
BUN: 16 mg/dL (ref 7–25)
CO2: 27 mmol/L (ref 20–32)
Calcium: 9.6 mg/dL (ref 8.6–10.4)
Chloride: 103 mmol/L (ref 98–110)
Creat: 0.85 mg/dL (ref 0.50–1.03)
Globulin: 4 g/dL (calc) — ABNORMAL HIGH (ref 1.9–3.7)
Glucose, Bld: 97 mg/dL (ref 65–99)
Total Bilirubin: 0.4 mg/dL (ref 0.2–1.2)

## 2022-09-08 LAB — THIOPURINE METHYLTRANSFERASE (TPMT), RBC: Thiopurine Methyltransferase, RBC: 13 nmol/hr/mL RBC

## 2022-09-08 LAB — VITAMIN D 1,25 DIHYDROXY: Vitamin D2 1, 25 (OH)2: 63 pg/mL

## 2022-10-07 ENCOUNTER — Ambulatory Visit: Payer: Managed Care, Other (non HMO) | Attending: Internal Medicine | Admitting: Internal Medicine

## 2022-10-07 ENCOUNTER — Encounter: Payer: Self-pay | Admitting: Internal Medicine

## 2022-10-07 VITALS — BP 115/76 | HR 81 | Resp 15 | Ht 64.5 in | Wt 210.0 lb

## 2022-10-07 DIAGNOSIS — D869 Sarcoidosis, unspecified: Secondary | ICD-10-CM | POA: Diagnosis not present

## 2022-10-07 DIAGNOSIS — E559 Vitamin D deficiency, unspecified: Secondary | ICD-10-CM

## 2022-10-07 DIAGNOSIS — R591 Generalized enlarged lymph nodes: Secondary | ICD-10-CM | POA: Diagnosis not present

## 2022-10-07 DIAGNOSIS — Z79899 Other long term (current) drug therapy: Secondary | ICD-10-CM | POA: Diagnosis not present

## 2022-10-07 NOTE — Progress Notes (Signed)
Office Visit Note  Patient: Ruth [name]             Date of Birth: 1960-08-17           MRN: [id number]             PCP: Donato Schultz, DO Referring: Donato Schultz, * Visit Date: 10/07/2022   Subjective:  Follow-up (Doing good)   History of Present Illness: Ruth [name] is a 62 y.o. female here for follow up for sarcoidosis with bulky cervical lymphadenopathy now on methotrexate 15 mg p.o. weekly folic acid 1 mg daily and tapered down off prednisone after last visit.  She is only started the methotrexate in the past 2 weeks was initially delaying this as she was at the hospital supporting her daughter who was admitted with major pregnancy complications.  X-ray obtained did not suggest any mediastinal lymphadenopathy.  Symptoms overall pretty stable still with bulky swallowing throughout the neck but not particularly painful.  Previous HPI 08/31/22 Ruth [name] is a 62 y.o. female here for follow up for sarcoidosis with fatigue and bulky lymphadenopathy now on prednisone 20 mg daily.  She has seen improvement of the large lymph node swelling.  Has noticed several side effects with unintentional weight gain, blurry vision, and cramping in her calfs and toes.  She has been trying to moderate her diet but definitely still increased appetite and becoming very tired shortly after eating.  Joint pain and stiffness including low back pain is doing great on the steroids.   Previous HPI 07/22/22 Ruth [name] is a 62 y.o. female here for evaluation of possible sarcoidosis with cervical lymphadenopathy evaluated with excisional biopsy.  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  Lymph node swelling started in July of last year she did not recall any specific preceding major illness or medical event.  Her initial visit with PCP labs did have positive  IgM and IgG for EBV but without known sick contact for infectious mononucleosis.  She was initially treated with Keflex and saw temporary improvement in symptoms but swelling has returned and been present continuously for at least the past 8 months.  Has been associated with some fatigue and had some tooth pain otherwise does not recall major change in symptoms otherwise.  Due to persistent lymphadenopathy she had CT imaging of the neck concerning for lymphoma and saw Dr. Myna Hidalgo with oncology.  Lab testing at the time showed a low positive ANA and she has a persistent elevated alkaline phosphatase attributed to gallbladder disease with multiple stones but minimal symptoms.  Subsequently went for cervical lymph node biopsy with ENT Dr. Jenne Pane this was negative for malignancy and suspicious for sarcoidosis or other reactive or autoimmune inflammatory process. She has chronic persistent moderate asthma on inhaler treatments and intermittently requires systemic steroids last treatment in March due to exacerbation with 1 week of prednisone.  But has not noticed cough dyspnea chest pain or other respiratory complaints outside of the usual for her asthma and allergies. She has a long history of eczema this was severe as a child with mild persistent symptoms still ongoing.  There are a few areas of permanent residual hypopigmentation.  She had a few skin colored raised nodules along the corners of the mouth and on her back evaluated by dermatology last year with biopsy showing some interstitial granulomatous process. She has chronic joint  pain and stiffness most often her legs with trouble from hip and knee pain that is worse trying to get up from the floor.  Occasionally gets pain in her hands and feet but without visible swelling.  She does not take any daily medication for joint pains. She denies any new visual changes eye pain or inflammation.  Does have chronically dry eyes but thinks this may be related to her  allergies or antihistamines.  No new hair loss, no photosensitivity, no Raynaud's symptoms.   02/05/22 CT Neck IMPRESSION: Widespread lymphadenopathy throughout all cervical nodal stations, symmetric from right to left. This includes enlargement of intraparotid lymph nodes. No single dominant node or necrotic node. Bilateral axillary lymphadenopathy also present. 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 Possible reactive nodal enlargement to some sort of systemic infection or autoimmune disease is also possible.   Review of Systems  Constitutional:  Positive for fatigue.  HENT:  Positive for mouth dryness. Negative for mouth sores.   Eyes:  Positive for dryness.  Respiratory:  Negative for shortness of breath.   Cardiovascular:  Negative for chest pain and palpitations.  Gastrointestinal:  Negative for blood in stool, constipation and diarrhea.  Endocrine: Negative for increased urination.  Genitourinary:  Negative for involuntary urination.  Musculoskeletal:  Positive for joint pain, gait problem, joint pain, myalgias, muscle weakness, morning stiffness and myalgias. Negative for joint swelling and muscle tenderness.  Skin:  Negative for color change, rash, hair loss and sensitivity to sunlight.  Allergic/Immunologic: Negative for susceptible to infections.  Neurological:  Negative for dizziness and headaches.  Hematological:  Positive for swollen glands.  Psychiatric/Behavioral:  Positive for sleep disturbance. Negative for depressed mood. The patient is nervous/anxious.     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   Preventative health  care 07/07/2020   SAH (subarachnoid hemorrhage) (HCC) 07/21/2019   RUQ pain 06/18/2019   Dyspepsia 06/18/2019   Moderate persistent asthma with acute exacerbation 03/05/2019   Vitamin D deficiency 07/03/2018   Depression 07/03/2018   Class 2 severe obesity with serious comorbidity and body mass index (BMI) of 37.0 to 37.9 in adult [facility]) 07/03/2018   Essential hypertension 09/19/2017   Aneurysm, cerebral, nonruptured 05/06/2015   White matter abnormality on MRI of brain 05/06/2015   Migraine headache without aura 05/06/2015   Dizziness 05/06/2015   Foreign body of ear, left 09/05/2012   HTN (hypertension) 06/12/2012   Allergic rhinitis due to pollen 12/27/2007   Allergic-infective asthma 08/29/2006   ECZEMA 08/29/2006    Past Medical History:  Diagnosis Date   Allergic rhinitis, cause unspecified    Asthma    Eczema    Food allergy    Gallbladder problem    Headache    Hypertension     Family History  Problem Relation Age of Onset   Asthma Mother    Allergies Mother    Asthma Father    Drug abuse Father    Allergies Father    Past Surgical History:  Procedure Laterality Date   BRAIN SURGERY     CESAREAN SECTION Bilateral [id number]   Social History   Social History Narrative   Exercise--no  Right handed   Caffiene1 cup   Lives alone and a two story home   Immunization History  Administered Date(s) Administered   PFIZER(Purple Top)SARS-COV-2 Vaccination 10/19/2019, 11/09/2019   Td 05/27/2012     Objective: Vital Signs: BP 115/76 (BP Location: Right Arm, Patient Position: Sitting, Cuff Size: Large)   Pulse 81   Resp 15   Ht 5' 4.5" (1.638 m)   Wt 210 lb (95.3 kg)   LMP 09/19/2015   BMI 35.49 kg/m    Physical Exam Constitutional:      Appearance: She is obese.  Eyes:     Conjunctiva/sclera: Conjunctivae normal.  Neck:     Comments: Large swollen lymph nodes nontender bilateral submandibular, anterior cervical, and right  supraclavicular Cardiovascular:     Rate and Rhythm: Normal rate and regular rhythm.  Pulmonary:     Effort: Pulmonary effort is normal.     Breath sounds: Normal breath sounds.  Lymphadenopathy:     Cervical: Cervical adenopathy present.  Skin:    General: Skin is warm and dry.  Neurological:     Mental Status: She is alert.  Psychiatric:        Mood and Affect: Mood normal.      Musculoskeletal Exam:  Shoulders full ROM no tenderness or swelling Elbows full ROM no tenderness or swelling Wrists full ROM no tenderness or swelling Fingers full ROM no tenderness or swelling Knees full ROM no tenderness or swelling   Investigation: No additional findings.  Imaging: No results found.  Recent Labs: Lab Results  Component Value Date   WBC 3.8 10/07/2022   HGB 13.6 10/07/2022   PLT 252 10/07/2022   NA 141 10/07/2022   K 3.8 10/07/2022   CL 106 10/07/2022   CO2 28 10/07/2022   GLUCOSE 85 10/07/2022   BUN 11 10/07/2022   CREATININE 0.93 10/07/2022   BILITOT 0.4 10/07/2022   ALKPHOS 221 (H) 02/16/2022   AST 21 10/07/2022   ALT 20 10/07/2022   PROT 7.6 10/07/2022   ALBUMIN 4.0 02/16/2022   CALCIUM 9.2 10/07/2022   GFRAA >60 07/21/2019    Speciality Comments: No specialty comments available.  Procedures:  No procedures performed Allergies: Other and Chlorhexidine gluconate   Assessment / Plan:     Visit Diagnoses: Sarcoidosis - Plan: Angiotensin converting enzyme, predniSONE (DELTASONE) 10 MG tablet, methotrexate (RHEUMATREX) 2.5 MG tablet  Clinically appears about the same as before.  With only starting methotrexate so recently I expect will need to go back on at least low-dose steroids for now to control inflammation but will check labs today for monitoring.  ACE appears to be a good biomarker for disease activity so far.  If elevated can try titration of methotrexate to 20 mg p.o. weekly continue folic acid 1 mg daily.  If worse would also resume prednisone at 10  mg once daily before more gradual tapering.  Lymphadenopathy  High risk medication use - Plan: CBC with Differential/Platelet, COMPLETE METABOLIC PANEL WITH GFR  Checking CBC and CMP for medication monitoring after new start of methotrexate.  Did not report any particular major side effect since starting medication.  Has been getting side effect of weight gain and swelling from the prednisone.  Orders: Orders Placed This Encounter  Procedures   Angiotensin converting enzyme   CBC with Differential/Platelet   COMPLETE METABOLIC PANEL WITH GFR   Meds ordered this encounter  Medications   predniSONE (DELTASONE) 10 MG tablet    Sig: Take 1 tablet (10  mg total) by mouth daily with breakfast.    Dispense:  60 tablet    Refill:  0   methotrexate (RHEUMATREX) 2.5 MG tablet    Sig: Take 8 tablets (20 mg total) by mouth once a week. Caution:Chemotherapy. Protect from light.    Dispense:  72 tablet    Refill:  0     Follow-Up Instructions: Return in about 10 weeks (around 12/16/2022) for Sarcoidosis on MTX/GC f/u ~10wks.   Fuller Plan, MD  Note - This record has been created using [employer].  Chart creation errors have been sought, but may not always  have been located. Such creation errors do not reflect on  the standard of medical care.

## 2022-10-08 LAB — COMPLETE METABOLIC PANEL WITH GFR
AG Ratio: 1.1 (calc) (ref 1.0–2.5)
ALT: 20 U/L (ref 6–29)
AST: 21 U/L (ref 10–35)
Albumin: 3.9 g/dL (ref 3.6–5.1)
Alkaline phosphatase (APISO): 193 U/L — ABNORMAL HIGH (ref 37–153)
BUN: 11 mg/dL (ref 7–25)
CO2: 28 mmol/L (ref 20–32)
Calcium: 9.2 mg/dL (ref 8.6–10.4)
Chloride: 106 mmol/L (ref 98–110)
Creat: 0.93 mg/dL (ref 0.50–1.03)
Globulin: 3.7 g/dL (calc) (ref 1.9–3.7)
Glucose, Bld: 85 mg/dL (ref 65–99)
Potassium: 3.8 mmol/L (ref 3.5–5.3)
Sodium: 141 mmol/L (ref 135–146)
Total Bilirubin: 0.4 mg/dL (ref 0.2–1.2)
Total Protein: 7.6 g/dL (ref 6.1–8.1)
eGFR: 71 mL/min/{1.73_m2} (ref >=60)

## 2022-10-08 LAB — CBC WITH DIFFERENTIAL/PLATELET
Absolute Monocytes: 281 cells/uL (ref 200–950)
Basophils Absolute: 42 cells/uL (ref 0–200)
Basophils Relative: 1.1 %
Eosinophils Absolute: 110 cells/uL (ref 15–500)
Eosinophils Relative: 2.9 %
HCT: 41.8 % (ref 35.0–45.0)
Hemoglobin: 13.6 g/dL (ref 11.7–15.5)
Lymphs Abs: 825 cells/uL — ABNORMAL LOW (ref 850–3900)
MCH: 27.7 pg (ref 27.0–33.0)
MCHC: 32.5 g/dL (ref 32.0–36.0)
MCV: 85.1 fL (ref 80.0–100.0)
MPV: 10.2 fL (ref 7.5–12.5)
Monocytes Relative: 7.4 %
Neutro Abs: 2542 cells/uL (ref 1500–7800)
Neutrophils Relative %: 66.9 %
Platelets: 252 10*3/uL (ref 140–400)
RBC: 4.91 10*6/uL (ref 3.80–5.10)
RDW: 13.7 % (ref 11.0–15.0)
Total Lymphocyte: 21.7 %
WBC: 3.8 10*3/uL (ref 3.8–10.8)

## 2022-10-08 LAB — ANGIOTENSIN CONVERTING ENZYME: Angiotensin-Converting Enzyme: 106 U/L — ABNORMAL HIGH (ref 9–67)

## 2022-10-08 MED ORDER — PREDNISONE 10 MG PO TABS
10.0000 mg | ORAL_TABLET | Freq: Every day | ORAL | 0 refills | Status: DC
Start: 2022-10-08 — End: 2022-12-16

## 2022-10-08 MED ORDER — METHOTREXATE SODIUM 2.5 MG PO TABS
20.0000 mg | ORAL_TABLET | ORAL | 0 refills | Status: DC
Start: 2022-10-08 — End: 2022-10-14

## 2022-10-08 NOTE — Progress Notes (Signed)
Her blood count and liver function are fine no problem from methotrexate. Her ACE level increased from 70 to 106 and alkaline phosphatase from 175 to 193. This indicates worsened disease activity. I recommend she increase the methotrexate to 20 mg weekly (8 tablets). She should also resume the prednisone 10 mg daily.  I am sending new prescriptions for both.

## 2022-10-13 ENCOUNTER — Telehealth: Payer: Self-pay | Admitting: *Deleted

## 2022-10-13 DIAGNOSIS — D869 Sarcoidosis, unspecified: Secondary | ICD-10-CM

## 2022-10-13 NOTE — Telephone Encounter (Signed)
Patient contacted the office stating she was started on MTX about a month ago. Patient states she has been feeling fatigued and having dizziness. Patient states she has been feeling very run down and sleeping more than normal. Patient states this did not start until after she started MTX. Please advise.

## 2022-10-14 ENCOUNTER — Encounter: Payer: Self-pay | Admitting: Family Medicine

## 2022-10-14 ENCOUNTER — Encounter: Payer: Self-pay | Admitting: Internal Medicine

## 2022-10-14 DIAGNOSIS — D869 Sarcoidosis, unspecified: Secondary | ICD-10-CM

## 2022-10-14 MED ORDER — AZATHIOPRINE 50 MG PO TABS
100.0000 mg | ORAL_TABLET | Freq: Every day | ORAL | 1 refills | Status: DC
Start: 2022-10-14 — End: 2022-12-16

## 2022-10-14 NOTE — Telephone Encounter (Signed)
I spoke with Ruth [name] she is having significant intolerance of the methotrexate with dizziness and severe fatigue lasting for 3 to 4 days with each treatment.  With this side effect I do not think switching to injectable form is likely to improve it.  We discussed alternate treatment options recommend switching to azathioprine I previously screened for TB MT enzyme function test in June that was normal.  Will start recommend titration up to 100 mg daily.

## 2022-10-14 NOTE — Addendum Note (Signed)
Addended by: Fuller Plan on: 10/14/2022 12:59 PM   Modules accepted: Orders

## 2022-10-14 NOTE — Telephone Encounter (Signed)
Patient contacted the office again. Patient is awaiting a response. Patient states she is not going to take her next dose of medication.

## 2022-11-01 ENCOUNTER — Encounter: Payer: Self-pay | Admitting: Family Medicine

## 2022-11-04 NOTE — Telephone Encounter (Signed)
Pt called back to follow up from previous message. She stated [facility] still has not heard back from Dr. Laury Axon. Please follow up & advise pt.

## 2022-11-29 ENCOUNTER — Other Ambulatory Visit: Payer: Self-pay | Admitting: Family Medicine

## 2022-11-29 DIAGNOSIS — I10 Essential (primary) hypertension: Secondary | ICD-10-CM

## 2022-12-03 NOTE — Progress Notes (Signed)
Office Visit Note  Patient: Ruth [name]             Date of Birth: July 07, 1960           MRN: [id number]             PCP: Donato Schultz, DO Referring: Donato Schultz, * Visit Date: 12/16/2022   Subjective:  Follow-up   History of Present Illness: Ruth [name] is a 62 y.o. female here for follow up for sarcoidosis with bulky cervical lymphadenopathy on prednisone 10 mg daily.  She developed severe side effects from methotrexate after last visit and stopped taking the medication.  She was experiencing dizziness and severe fatigue lasting for 3 to 4 days at a time after each dose.  When she stopped the methotrexate after a week she felt back to her normal state.  We discussed starting azathioprine as an alternate steroid sparing DMARD but she did not begin this with concerns about the medication after her bad experience of methotrexate.  Has been taking the prednisone 10 mg daily and feels that the large swollen nodules in her neck have continued to improve.  No new chest pain shortness of breath or coughing.  She does have a large amount of unintentional weight gain from the prolonged steroids estimates about 37 pounds.  Previous HPI 10/07/2022 Ruth [name] is a 62 y.o. female here for follow up for sarcoidosis with bulky cervical lymphadenopathy now on methotrexate 15 mg p.o. weekly folic acid 1 mg daily and tapered down off prednisone after last visit.  She is only started the methotrexate in the past 2 weeks was initially delaying this as she was at the hospital supporting her daughter who was admitted with major pregnancy complications.  X-ray obtained did not suggest any mediastinal lymphadenopathy.  Symptoms overall pretty stable still with bulky swallowing throughout the neck but not particularly painful.   Previous HPI 08/31/22 Ruth [name] is a 62 y.o. female here for follow up for sarcoidosis with fatigue and bulky lymphadenopathy now on prednisone 20 mg  daily.  She has seen improvement of the large lymph node swelling.  Has noticed several side effects with unintentional weight gain, blurry vision, and cramping in her calfs and toes.  She has been trying to moderate her diet but definitely still increased appetite and becoming very tired shortly after eating.  Joint pain and stiffness including low back pain is doing great on the steroids.   Previous HPI 07/22/22 Ruth [name] is a 62 y.o. female here for evaluation of possible sarcoidosis with cervical lymphadenopathy evaluated with excisional biopsy.  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  Lymph node swelling started in July of last year she did not recall any specific preceding major illness or medical event.  Her initial visit with PCP labs did have positive IgM and IgG for EBV but without known sick contact for infectious mononucleosis.  She was initially treated with Keflex and saw temporary improvement in symptoms but swelling has returned and been present continuously for at least the past 8 months.  Has been associated with some fatigue and had some tooth pain otherwise does not recall major change in symptoms otherwise.  Due to persistent lymphadenopathy she had CT imaging of the neck concerning for lymphoma and saw Dr. Myna Hidalgo with oncology.  Lab testing at the time showed a low positive ANA  and she has a persistent elevated alkaline phosphatase attributed to gallbladder disease with multiple stones but minimal symptoms.  Subsequently went for cervical lymph node biopsy with ENT Dr. Jenne Pane this was negative for malignancy and suspicious for sarcoidosis or other reactive or autoimmune inflammatory process. She has chronic persistent moderate asthma on inhaler treatments and intermittently requires systemic steroids last treatment in March due to exacerbation with 1 week  of prednisone.  But has not noticed cough dyspnea chest pain or other respiratory complaints outside of the usual for her asthma and allergies. She has a long history of eczema this was severe as a child with mild persistent symptoms still ongoing.  There are a few areas of permanent residual hypopigmentation.  She had a few skin colored raised nodules along the corners of the mouth and on her back evaluated by dermatology last year with biopsy showing some interstitial granulomatous process. She has chronic joint pain and stiffness most often her legs with trouble from hip and knee pain that is worse trying to get up from the floor.  Occasionally gets pain in her hands and feet but without visible swelling.  She does not take any daily medication for joint pains. She denies any new visual changes eye pain or inflammation.  Does have chronically dry eyes but thinks this may be related to her allergies or antihistamines.  No new hair loss, no photosensitivity, no Raynaud's symptoms.   02/05/22 CT Neck IMPRESSION: Widespread lymphadenopathy throughout all cervical nodal stations, symmetric from right to left. This includes enlargement of intraparotid lymph nodes. No single dominant node or necrotic node. Bilateral axillary lymphadenopathy also present. 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 Possible reactive nodal enlargement to some sort of systemic infection or autoimmune disease is also possible.   Review of Systems  Constitutional:  Negative for fatigue.  HENT:  Negative for mouth sores and mouth dryness.   Eyes:  Positive for dryness.  Respiratory:  Negative for shortness of breath.   Cardiovascular:  Negative for chest pain and palpitations.  Gastrointestinal:  Negative for blood in stool, constipation and diarrhea.  Endocrine: Negative for increased urination.  Genitourinary:  Negative for involuntary urination.  Musculoskeletal:  Positive for joint pain, gait problem, joint pain, joint  swelling, myalgias, muscle weakness, morning stiffness and myalgias. Negative for muscle tenderness.  Skin:  Negative for color change, rash, hair loss and sensitivity to sunlight.  Allergic/Immunologic: Negative for susceptible to infections.  Neurological:  Positive for dizziness and headaches.  Hematological:  Negative for swollen glands.  Psychiatric/Behavioral:  Positive for sleep disturbance. Negative for depressed mood. The patient is nervous/anxious.     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   Preventative health care 07/07/2020   SAH (subarachnoid hemorrhage) (HCC) 07/21/2019   RUQ pain 06/18/2019   Dyspepsia 06/18/2019   Moderate persistent asthma with acute exacerbation 03/05/2019   Vitamin D deficiency 07/03/2018   Depression 07/03/2018   Class 2 severe obesity with serious comorbidity and body mass index (BMI) of 37.0 to 37.9 in adult [facility]) 07/03/2018   Essential hypertension 09/19/2017   Aneurysm, cerebral, nonruptured 05/06/2015   White matter abnormality on MRI of brain  05/06/2015   Migraine headache without aura 05/06/2015   Dizziness 05/06/2015   Foreign body of ear, left 09/05/2012   HTN (hypertension) 06/12/2012   Allergic rhinitis due to pollen 12/27/2007   Allergic-infective asthma 08/29/2006   ECZEMA 08/29/2006    Past Medical History:  Diagnosis Date   Allergic rhinitis, cause unspecified    Asthma    Eczema    Food allergy    Gallbladder problem    Headache    Hypertension     Family History  Problem Relation Age of Onset   Asthma Mother    Allergies Mother    Asthma Father    Drug abuse Father     Allergies Father    Past Surgical History:  Procedure Laterality Date   BRAIN SURGERY     CESAREAN SECTION Bilateral [id number]   Social History   Social History Narrative   Exercise--no   Right handed   Caffiene1 cup   Lives alone and a two story home   Immunization History  Administered Date(s) Administered   PFIZER(Purple Top)SARS-COV-2 Vaccination 10/19/2019, 11/09/2019   Td 05/27/2012     Objective: Vital Signs: BP 136/82 (BP Location: Left Arm, Patient Position: Sitting, Cuff Size: Normal)   Pulse 80   Resp 14   Ht 5\' 4"  (1.626 m)   Wt 225 lb (102.1 kg)   LMP 09/19/2015   BMI 38.62 kg/m    Physical Exam Constitutional:      Appearance: She is obese.  Eyes:     Conjunctiva/sclera: Conjunctivae normal.  Cardiovascular:     Rate and Rhythm: Normal rate and regular rhythm.  Pulmonary:     Effort: Pulmonary effort is normal.     Breath sounds: Normal breath sounds.  Musculoskeletal:     Right lower leg: No edema.     Left lower leg: No edema.  Lymphadenopathy:     Cervical: No cervical adenopathy (Bulky preauricular and cervical adenopathy on both sides, nontender).  Skin:    General: Skin is warm and dry.     Findings: No rash.  Neurological:     Mental Status: She is alert.  Psychiatric:        Mood and Affect: Mood normal.      Musculoskeletal Exam:  Shoulders full ROM no tenderness or swelling Elbows full ROM no tenderness or swelling Wrists full ROM no tenderness or swelling Fingers full ROM no tenderness or swelling Knees full ROM no tenderness or swelling Ankles full ROM no tenderness or swelling   Investigation: No additional findings.  Imaging: No results found.  Recent Labs: Lab Results  Component Value Date   WBC 3.8 10/07/2022   HGB 13.6 10/07/2022   PLT 252 10/07/2022   NA 141 10/07/2022   K 3.8 10/07/2022   CL 106 10/07/2022   CO2 28 10/07/2022   GLUCOSE 85 10/07/2022   BUN 11 10/07/2022   CREATININE 0.93 10/07/2022    BILITOT 0.4 10/07/2022   ALKPHOS 221 (H) 02/16/2022   AST 21 10/07/2022   ALT 20 10/07/2022   PROT 7.6 10/07/2022   ALBUMIN 4.0 02/16/2022   CALCIUM 9.2 10/07/2022   GFRAA >60 07/21/2019    Speciality Comments: No specialty comments available.  Procedures:  No procedures performed Allergies: Other and Chlorhexidine gluconate   Assessment / Plan:     Visit Diagnoses: Sarcoidosis - Plan: predniSONE (DELTASONE) 5 MG tablet, Angiotensin converting enzyme  Biopsy confirmed sarcoidosis characterized by bulky adenopathy.  No radiographic pulmonary involvement and no  new symptoms over the interval.  She continues having a partial improvement in swelling on the steroids.  Will recheck a's level which has corresponded with activity pretty well.  Assuming this has come back down on the steroids we will begin gradual tapering by a decrease down to 7.5 and continue reduction by 2.5 mg daily each month.  With previous rise in levels after steroid cessation will benefit with steroid sparing DMARD plan to start azathioprine with titrate up to 100 mg daily.  High risk medication use -. Methotrexate discontinued by Dr. Dimple Casey on 10/08/2022. Azathioprine 100 mg daily.  Previous blood count metabolic panel were fine and not on a new medication so far.  Previously checked TPMT enzyme function test 13 within normal range.  I do not believe the severe fatigue after dosing and dizziness would be a common side effect risk.  Reviewed risk of azathioprine including GI intolerance, cytopenias, hepatotoxicity, infections, malignancy with long-term use.  Long term (current) use of systemic steroids .  Working well for her but excessive unintentional weight gain.  Beginning tapering as above.   Orders: Orders Placed This Encounter  Procedures   Angiotensin converting enzyme   Meds ordered this encounter  Medications   predniSONE (DELTASONE) 5 MG tablet    Sig: Take 1.5 tablets (7.5 mg total) by mouth daily with  breakfast for 30 days, THEN 1 tablet (5 mg total) daily with breakfast for 30 days, THEN 0.5 tablets (2.5 mg total) daily with breakfast.    Dispense:  90 tablet    Refill:  0     Follow-Up Instructions: Return in about 2 months (around 02/15/2023) for Sarcoidosis GC/?AZA f/u 2mos.   Fuller Plan, MD  Note - This record has been created using [employer].  Chart creation errors have been sought, but may not always  have been located. Such creation errors do not reflect on  the standard of medical care.

## 2022-12-16 ENCOUNTER — Encounter: Payer: Self-pay | Admitting: Internal Medicine

## 2022-12-16 ENCOUNTER — Ambulatory Visit: Payer: Managed Care, Other (non HMO) | Attending: Internal Medicine | Admitting: Internal Medicine

## 2022-12-16 VITALS — BP 136/82 | HR 80 | Resp 14 | Ht 64.0 in | Wt 225.0 lb

## 2022-12-16 DIAGNOSIS — Z7952 Long term (current) use of systemic steroids: Secondary | ICD-10-CM

## 2022-12-16 DIAGNOSIS — Z79899 Other long term (current) drug therapy: Secondary | ICD-10-CM

## 2022-12-16 DIAGNOSIS — D869 Sarcoidosis, unspecified: Secondary | ICD-10-CM | POA: Diagnosis not present

## 2022-12-16 DIAGNOSIS — R591 Generalized enlarged lymph nodes: Secondary | ICD-10-CM | POA: Diagnosis not present

## 2022-12-16 MED ORDER — PREDNISONE 5 MG PO TABS
ORAL_TABLET | ORAL | 0 refills | Status: DC
Start: 2022-12-16 — End: 2023-02-16

## 2022-12-16 NOTE — Patient Instructions (Signed)
Azathioprine Tablets What is this medication? AZATHIOPRINE (ay za THYE oh preen) prevents the body from rejecting an organ transplant. It works by lowering the body's immune system response. This helps the body accept the donor organ. It may also be used to treat rheumatoid arthritis. This medicine may be used for other purposes; ask your health care provider or pharmacist if you have questions. COMMON BRAND NAME(S): Azasan, Imuran What should I tell my care team before I take this medication? They need to know if you have any of these conditions: Infection Kidney disease Liver disease An unusual or allergic reaction to azathioprine, lactose, other medications, foods, dyes, or preservatives Pregnant or trying to get pregnant Breastfeeding How should I use this medication? Take this medication by mouth with a full glass of water. Take it as directed on the prescription label at the same time every day. Keep taking it unless your care team tells you to stop. Keep taking it even if you think you are better. Talk to your care team about the use of this medication in children. Special care may be needed. Overdosage: If you think you have taken too much of this medicine contact a poison control center or emergency room at once. NOTE: This medicine is only for you. Do not share this medicine with others. What if I miss a dose? If you miss a dose, take it as soon as you can. If it is almost time for your next dose, take only that dose. Do not take double or extra doses. What may interact with this medication? Do not take this medication with any of the following: Febuxostat Mercaptopurine This medication may also interact with the following: Allopurinol Aminosalicylates, such as sulfasalazine, mesalamine, balsalazide, and olsalazine Leflunomide Medications called ACE inhibitors, such as benazepril, captopril, enalapril, fosinopril, quinapril, lisinopril, ramipril, and  trandolapril Mycophenolate Sulfamethoxazole; trimethoprim Vaccines Warfarin This list may not describe all possible interactions. Give your health care provider a list of all the medicines, herbs, non-prescription drugs, or dietary supplements you use. Also tell them if you smoke, drink alcohol, or use illegal drugs. Some items may interact with your medicine. What should I watch for while using this medication? Visit your care team for regular checks on your progress. You may need blood work done while you are taking this medication. This medication may increase your risk of getting an infection. Call your care team for advice if you get a fever, chills, sore throat, or other symptoms of a cold or flu. Do not treat yourself. Try to avoid being around people who are sick. Talk to your care team about your risk of cancer. You may be more at risk for certain types of cancer if you take this medication. Talk to your care team if you may be pregnant. This medication can cause serious birth defects if taken during pregnancy. This medication may cause infertility. Talk to your care team if you are concerned about your fertility. What side effects may I notice from receiving this medication? Side effects that you should report to your care team as soon as possible: Allergic reactions--skin rash, itching, hives, swelling of the face, lips, tongue, or throat Change in your skin, such as a new growth, a sore that doesn't heal, or a change in a mole Dizziness, loss of balance or coordination, confusion or trouble speaking Infection--fever, chills, cough, sore throat, wounds that don't heal, pain or trouble when passing urine, general feeling of discomfort or being unwell Low red blood cell level--unusual  weakness or fatigue, dizziness, headache, trouble breathing Unusual bruising or bleeding Side effects that usually do not require medical attention (report to your care team if they continue or are  bothersome): Diarrhea Fatigue Nausea Vomiting This list may not describe all possible side effects. Call your doctor for medical advice about side effects. You may report side effects to FDA at 1-800-FDA-1088. Where should I keep my medication? Keep out of the reach of children and pets. Store at room temperature between 15 and 25 degrees C (59 and 77 degrees F). Protect from light. Get rid of any unused medication after the expiration date. To get rid of medications that are no longer needed or have expired: Take the medication to a medication take-back program. Check with your pharmacy or law enforcement to find a location. If you cannot return the medication, check the label or package insert to see if the medication should be thrown out in the garbage or flushed down the toilet. If you are not sure, ask your care team. If it is safe to put it in the trash, empty the medication out of the container. Mix the medication with cat litter, dirt, coffee grounds, or other unwanted substance. Seal the mixture in a bag or container. Put it in the trash. NOTE: This sheet is a summary. It may not cover all possible information. If you have questions about this medicine, talk to your doctor, pharmacist, or health care provider.  2024 Elsevier/Gold Standard (2021-08-18 00:00:00)

## 2022-12-17 ENCOUNTER — Telehealth: Payer: Self-pay | Admitting: *Deleted

## 2022-12-17 LAB — ANGIOTENSIN CONVERTING ENZYME: Angiotensin-Converting Enzyme: 94 U/L — ABNORMAL HIGH (ref 9–67)

## 2022-12-17 MED ORDER — AZATHIOPRINE 50 MG PO TABS: ORAL_TABLET | ORAL | 1 refills | Status: DC

## 2022-12-17 NOTE — Telephone Encounter (Signed)
Patient called, were you going to send in RX to [place], [place] - Azathioprine?  Note 12/16/2022 With previous rise in levels after steroid cessation will benefit with steroid sparing DMARD plan to start azathioprine with titrate up to 100 mg daily.   High risk medication use -. Methotrexate discontinued by Dr. Dimple Casey on 10/08/2022. Azathioprine 100 mg daily

## 2022-12-17 NOTE — Addendum Note (Signed)
Addended by: Fuller Plan on: 12/17/2022 04:55 PM   Modules accepted: Orders

## 2022-12-17 NOTE — Telephone Encounter (Signed)
I called patient

## 2022-12-17 NOTE — Telephone Encounter (Signed)
Sent Rx to [facility] today./

## 2022-12-30 DIAGNOSIS — Z0289 Encounter for other administrative examinations: Secondary | ICD-10-CM

## 2023-01-27 ENCOUNTER — Ambulatory Visit (INDEPENDENT_AMBULATORY_CARE_PROVIDER_SITE_OTHER): Payer: Self-pay | Admitting: Family Medicine

## 2023-01-29 ENCOUNTER — Other Ambulatory Visit: Payer: Self-pay | Admitting: Family Medicine

## 2023-01-29 DIAGNOSIS — R1013 Epigastric pain: Secondary | ICD-10-CM

## 2023-02-02 NOTE — Progress Notes (Signed)
Office Visit Note  Patient: Ruth [name]             Date of Birth: 11-07-1960           MRN: [id number]             PCP: Donato Schultz, DO Referring: Donato Schultz, * Visit Date: 02/16/2023   Subjective:  Follow-up (Patient states the azathioprine has up her stomach a little bit. Patient states it has affected her appetite as well. Patient states she has had more acid reflux. )   History of Present Illness: Ruth [name] is a 62 y.o. female here for follow up for sarcoidosis with bulky cervical lymphadenopathy now on azathioprine 100 mg daily.  She tapered off of prednisone completely since our last visit.  Since stopping the medication noticed increased in joint stiffness and achiness in multiple areas.  Not with any visible swelling.  Seems worse first thing in the morning and getting up after she sits for more than a few minutes at a time.  She is noticing nausea and decreased appetite from the azathioprine also noticed a darker color to her urine.  No new skin rashes.  Still has ongoing lymph node swelling but decreased size from before.  Previous HPI 12/16/2022 [facility]. Ruth [name] is a 62 y.o. female here for follow up for sarcoidosis with bulky cervical lymphadenopathy on prednisone 10 mg daily.  She developed severe side effects from methotrexate after last visit and stopped taking the medication.  She was experiencing dizziness and severe fatigue lasting for 3 to 4 days at a time after each dose.  When she stopped the methotrexate after a week she felt back to her normal state.  We discussed starting azathioprine as an alternate steroid sparing DMARD but she did not begin this with concerns about the medication after her bad experience of methotrexate.  Has been taking the prednisone 10 mg daily and feels that the large swollen nodules in her neck have continued to improve.  No new chest pain shortness of breath or coughing.  She does have a large amount of  unintentional weight gain from the prolonged steroids estimates about 37 pounds.   Previous HPI 10/07/2022 Ruth [name] is a 62 y.o. female here for follow up for sarcoidosis with bulky cervical lymphadenopathy now on methotrexate 15 mg p.o. weekly folic acid 1 mg daily and tapered down off prednisone after last visit.  She is only started the methotrexate in the past 2 weeks was initially delaying this as she was at the hospital supporting her daughter who was admitted with major pregnancy complications.  X-ray obtained did not suggest any mediastinal lymphadenopathy.  Symptoms overall pretty stable still with bulky swallowing throughout the neck but not particularly painful.   Previous HPI 08/31/22 Ruth [name] is a 62 y.o. female here for follow up for sarcoidosis with fatigue and bulky lymphadenopathy now on prednisone 20 mg daily.  She has seen improvement of the large lymph node swelling.  Has noticed several side effects with unintentional weight gain, blurry vision, and cramping in her calfs and toes.  She has been trying to moderate her diet but definitely still increased appetite and becoming very tired shortly after eating.  Joint pain and stiffness including low back pain is doing great on the steroids.   Previous HPI 07/22/22 K[facility] [name] is a 62 y.o. female here for evaluation of possible sarcoidosis with cervical lymphadenopathy evaluated with excisional  biopsy.  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  Lymph node swelling started in July of last year she did not recall any specific preceding major illness or medical event.  Her initial visit with PCP labs did have positive IgM and IgG for EBV but without known sick contact for infectious mononucleosis.  She was initially treated with Keflex and saw temporary improvement in symptoms but swelling has  returned and been present continuously for at least the past 8 months.  Has been associated with some fatigue and had some tooth pain otherwise does not recall major change in symptoms otherwise.  Due to persistent lymphadenopathy she had CT imaging of the neck concerning for lymphoma and saw Dr. Myna Hidalgo with oncology.  Lab testing at the time showed a low positive ANA and she has a persistent elevated alkaline phosphatase attributed to gallbladder disease with multiple stones but minimal symptoms.  Subsequently went for cervical lymph node biopsy with ENT Dr. Jenne Pane this was negative for malignancy and suspicious for sarcoidosis or other reactive or autoimmune inflammatory process. She has chronic persistent moderate asthma on inhaler treatments and intermittently requires systemic steroids last treatment in March due to exacerbation with 1 week of prednisone.  But has not noticed cough dyspnea chest pain or other respiratory complaints outside of the usual for her asthma and allergies. She has a long history of eczema this was severe as a child with mild persistent symptoms still ongoing.  There are a few areas of permanent residual hypopigmentation.  She had a few skin colored raised nodules along the corners of the mouth and on her back evaluated by dermatology last year with biopsy showing some interstitial granulomatous process. She has chronic joint pain and stiffness most often her legs with trouble from hip and knee pain that is worse trying to get up from the floor.  Occasionally gets pain in her hands and feet but without visible swelling.  She does not take any daily medication for joint pains. She denies any new visual changes eye pain or inflammation.  Does have chronically dry eyes but thinks this may be related to her allergies or antihistamines.  No new hair loss, no photosensitivity, no Raynaud's symptoms.   02/05/22 CT Neck IMPRESSION: Widespread lymphadenopathy throughout all cervical  nodal stations, symmetric from right to left. This includes enlargement of intraparotid lymph nodes. No single dominant node or necrotic node. Bilateral axillary lymphadenopathy also present. 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 Possible reactive nodal enlargement to some sort of systemic infection or autoimmune disease is also possible.   Review of Systems  Constitutional:  Positive for fatigue.  HENT:  Negative for mouth sores and mouth dryness.   Eyes:  Positive for dryness.  Respiratory:  Negative for shortness of breath.   Cardiovascular:  Negative for chest pain and palpitations.  Gastrointestinal:  Positive for constipation. Negative for blood in stool and diarrhea.  Endocrine: Negative for increased urination.  Genitourinary:  Negative for involuntary urination.  Musculoskeletal:  Positive for joint pain, gait problem, joint pain, myalgias, muscle weakness, morning stiffness, muscle tenderness and myalgias. Negative for joint swelling.  Skin:  Negative for color change, rash, hair loss and sensitivity to sunlight.  Allergic/Immunologic: Negative for susceptible to infections.  Neurological:  Negative for dizziness and headaches.  Hematological:  Positive for swollen glands.  Psychiatric/Behavioral:  Negative for depressed mood and sleep disturbance. The patient is nervous/anxious.  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   Preventative health care 07/07/2020   SAH (subarachnoid hemorrhage) (HCC) 07/21/2019   RUQ pain 06/18/2019   Dyspepsia 06/18/2019   Moderate persistent asthma with acute exacerbation 03/05/2019   Vitamin  D deficiency 07/03/2018   Depression 07/03/2018   Class 2 severe obesity with serious comorbidity and body mass index (BMI) of 37.0 to 37.9 in adult (HCC) 07/03/2018   Essential hypertension 09/19/2017   Aneurysm, cerebral, nonruptured 05/06/2015   White matter abnormality on MRI of brain 05/06/2015   Migraine headache without aura 05/06/2015   Dizziness 05/06/2015   Foreign body of ear, left 09/05/2012   HTN (hypertension) 06/12/2012   Allergic rhinitis due to pollen 12/27/2007   Allergic-infective asthma 08/29/2006   ECZEMA 08/29/2006    Past Medical History:  Diagnosis Date   Allergic rhinitis, cause unspecified    Asthma    Eczema    Food allergy    Gallbladder problem    Headache    Hypertension     Family History  Problem Relation Age of Onset   Asthma Mother    Allergies Mother    Asthma Father    Drug abuse Father    Allergies Father    Past Surgical History:  Procedure Laterality Date   BRAIN SURGERY     CESAREAN SECTION Bilateral [id number]   Social History   Social History Narrative   Exercise--no   Right handed   Caffiene1 cup   Lives alone and a two story home   Immunization History  Administered Date(s) Administered   PFIZER(Purple Top)SARS-COV-2 Vaccination 10/19/2019, 11/09/2019   Td 05/27/2012     Objective: Vital Signs: BP 131/83 (BP Location: Left Arm, Patient Position: Sitting, Cuff Size: Normal)   Pulse 90   Resp 14   Ht 5\' 4"  (1.626 m)   Wt 228 lb (103.4 kg)   LMP 09/19/2015   BMI 39.14 kg/m    Physical Exam Constitutional:      Appearance: She is obese.  Eyes:     Conjunctiva/sclera: Conjunctivae normal.  Neck:     Comments: Multiple large submandibular and cervical lymph nodes without tenderness to pressure No axillary or supraclavicular nodes palpable Cardiovascular:     Rate and Rhythm: Normal rate and regular rhythm.  Pulmonary:     Effort: Pulmonary effort is normal.     Breath sounds: Normal breath sounds.   Musculoskeletal:     Right lower leg: No edema.     Left lower leg: No edema.  Lymphadenopathy:     Cervical: Cervical adenopathy present.  Skin:    General: Skin is warm and dry.     Findings: No rash.  Neurological:     Mental Status: She is alert.  Psychiatric:        Mood and Affect: Mood normal.      Musculoskeletal Exam:  Shoulders full ROM no tenderness or swelling Elbows full ROM no tenderness or swelling Wrists full ROM no tenderness or swelling Fingers full ROM no tenderness or swelling Knees full ROM no tenderness or swelling Ankles full ROM no tenderness or swelling   Investigation: No additional findings.  Imaging: No results found.  Recent Labs: Lab Results  Component Value Date   WBC 3.8 10/07/2022   HGB 13.6 10/07/2022   PLT 252 10/07/2022   NA 141 10/07/2022   K 3.8 10/07/2022   CL 106 10/07/2022   CO2 28 10/07/2022   GLUCOSE 85 10/07/2022   BUN 11 10/07/2022   CREATININE 0.93 10/07/2022   BILITOT 0.4 10/07/2022   ALKPHOS 221 (H) 02/16/2022   AST 21 10/07/2022   ALT 20 10/07/2022   PROT 7.6 10/07/2022   ALBUMIN 4.0 02/16/2022   CALCIUM 9.2 10/07/2022   GFRAA >60 07/21/2019    Speciality Comments: No specialty comments available.  Procedures:  No procedures performed Allergies: Other and Chlorhexidine gluconate   Assessment / Plan:     Visit Diagnoses: Sarcoidosis - Biopsy confirmed sarcoidosis characterized by bulky adenopathy. - Plan: azaTHIOprine (IMURAN) 50 MG tablet, ondansetron (ZOFRAN) 4 MG tablet, Vitamin D 1,25 dihydroxy, Angiotensin converting enzyme  No new pulmonary symptoms but still has persistent bulky cervical lymphadenopathy.  No obvious active skin rashes.  Increased joint pain in multiple areas but not definitely inflammatory.  She tapered off the prednisone earlier than expected but will recheck for disease activity including activated vitamin D and ACE level.  Recommend trial of addition Zofran 4 mg along with her  azathioprine to improve nausea.  If still not improved can try decreasing down to 75 mg daily for better tolerance.  High risk medication use - Azathioprine 100 mg daily. - Plan: CBC with Differential/Platelet, COMPLETE METABOLIC PANEL WITH GFR  Checking CBC and CMP for medication monitoring on azathioprine 100 mg daily.  May noticeable side effect is GI.  No serious interval infection.  Long term (current) use of systemic steroids - steroids we will begin gradual tapering by a decrease down to 7.5 and continue reduction by 2.5 mg daily each month.  Vitamin D deficiency - Plan: VITAMIN D 25 Hydroxy (Vit-D Deficiency, Fractures)  She has been on very high-dose vitamin D supplement levels well in the normal range.  With high activated vitamin D due to peripheral conversion.  Rechecking levels today for disease activity and see if her supplement should be changed to normal daily maintenance dose.  Arthralgia, unspecified joint - Plan: ibuprofen (ADVIL) 800 MG tablet  Discussed use of high-dose ibuprofen but once daily is probably fine with normal baseline renal function.  Sent prescription for 100 mg tablet.  If resuming low-dose steroids discussed stopping NSAIDs at that time. Also discussed possibility to do left knee steroid injections for osteoarthritis which have been beneficial for her in the past.  Orders: Orders Placed This Encounter  Procedures   Vitamin D 1,25 dihydroxy   VITAMIN D 25 Hydroxy (Vit-D Deficiency, Fractures)   Angiotensin converting enzyme   CBC with Differential/Platelet   COMPLETE METABOLIC PANEL WITH GFR   Meds ordered this encounter  Medications   azaTHIOprine (IMURAN) 50 MG tablet    Sig: Take 2 tablets (100 mg total) by mouth daily.    Dispense:  180 tablet    Refill:  0   ondansetron (ZOFRAN) 4 MG tablet    Sig: Take 1 tablet (4 mg total) by mouth daily as needed for nausea or vomiting.    Dispense:  30 tablet    Refill:  0   ibuprofen (ADVIL) 800 MG  tablet    Sig: Take 1 tablet (800 mg total) by mouth daily as needed.    Dispense:  90 tablet    Refill:  0     Follow-Up Instructions: Return in about 3 months (around 05/19/2023) for Sarcoidosis on AZA/?GC f/u 3mos.   Fuller Plan, MD  Note - This record has been created using [employer].  Chart creation errors have been sought, but may not always  have been located. Such creation errors do not reflect on  the standard of medical care.

## 2023-02-08 ENCOUNTER — Encounter: Payer: Self-pay | Admitting: Family Medicine

## 2023-02-08 DIAGNOSIS — E559 Vitamin D deficiency, unspecified: Secondary | ICD-10-CM

## 2023-02-08 MED ORDER — VITAMIN D (ERGOCALCIFEROL) 1.25 MG (50000 UNIT) PO CAPS: 50000.0000 [IU] | ORAL_CAPSULE | ORAL | 2 refills | Status: DC

## 2023-02-10 ENCOUNTER — Ambulatory Visit (INDEPENDENT_AMBULATORY_CARE_PROVIDER_SITE_OTHER): Payer: Self-pay | Admitting: Family Medicine

## 2023-02-16 ENCOUNTER — Ambulatory Visit: Payer: Managed Care, Other (non HMO) | Attending: Internal Medicine | Admitting: Internal Medicine

## 2023-02-16 ENCOUNTER — Encounter: Payer: Self-pay | Admitting: Internal Medicine

## 2023-02-16 ENCOUNTER — Encounter: Payer: Self-pay | Admitting: Family Medicine

## 2023-02-16 VITALS — BP 131/83 | HR 90 | Resp 14 | Ht 64.0 in | Wt 228.0 lb

## 2023-02-16 DIAGNOSIS — Z79899 Other long term (current) drug therapy: Secondary | ICD-10-CM | POA: Diagnosis not present

## 2023-02-16 DIAGNOSIS — Z7952 Long term (current) use of systemic steroids: Secondary | ICD-10-CM | POA: Diagnosis not present

## 2023-02-16 DIAGNOSIS — D869 Sarcoidosis, unspecified: Secondary | ICD-10-CM | POA: Diagnosis not present

## 2023-02-16 DIAGNOSIS — E559 Vitamin D deficiency, unspecified: Secondary | ICD-10-CM

## 2023-02-16 DIAGNOSIS — M2550 Pain in unspecified joint: Secondary | ICD-10-CM

## 2023-02-16 DIAGNOSIS — J4541 Moderate persistent asthma with (acute) exacerbation: Secondary | ICD-10-CM

## 2023-02-16 MED ORDER — ONDANSETRON HCL 4 MG PO TABS
4.0000 mg | ORAL_TABLET | Freq: Every day | ORAL | 0 refills | Status: AC | PRN
Start: 2023-02-16 — End: ?

## 2023-02-16 MED ORDER — IBUPROFEN 800 MG PO TABS
800.0000 mg | ORAL_TABLET | Freq: Every day | ORAL | 0 refills | Status: AC | PRN
Start: 2023-02-16 — End: ?

## 2023-02-16 MED ORDER — AZATHIOPRINE 50 MG PO TABS
100.0000 mg | ORAL_TABLET | Freq: Every day | ORAL | 0 refills | Status: DC
Start: 2023-02-16 — End: 2023-03-07

## 2023-02-17 MED ORDER — FLUTICASONE FUROATE-VILANTEROL 100-25 MCG/ACT IN AEPB: 1.0000 | INHALATION_SPRAY | Freq: Every day | RESPIRATORY_TRACT | 2 refills | Status: DC

## 2023-02-21 ENCOUNTER — Telehealth: Payer: Self-pay

## 2023-02-21 LAB — COMPLETE METABOLIC PANEL WITH GFR
AG Ratio: 1.1 (calc) (ref 1.0–2.5)
ALT: 15 U/L (ref 6–29)
AST: 25 U/L (ref 10–35)
Albumin: 4.2 g/dL (ref 3.6–5.1)
Alkaline phosphatase (APISO): 195 U/L — ABNORMAL HIGH (ref 37–153)
BUN: 12 mg/dL (ref 7–25)
CO2: 27 mmol/L (ref 20–32)
Calcium: 9.6 mg/dL (ref 8.6–10.4)
Chloride: 105 mmol/L (ref 98–110)
Creat: 0.95 mg/dL (ref 0.50–1.03)
Globulin: 3.7 g/dL (ref 1.9–3.7)
Glucose, Bld: 93 mg/dL (ref 65–99)
Potassium: 3.9 mmol/L (ref 3.5–5.3)
Sodium: 141 mmol/L (ref 135–146)
Total Bilirubin: 0.4 mg/dL (ref 0.2–1.2)
Total Protein: 7.9 g/dL (ref 6.1–8.1)
eGFR: 69 mL/min/{1.73_m2} (ref >=60)

## 2023-02-21 LAB — CBC WITH DIFFERENTIAL/PLATELET
Absolute Lymphocytes: 772 {cells}/uL — ABNORMAL LOW (ref 850–3900)
Absolute Monocytes: 292 {cells}/uL (ref 200–950)
Basophils Absolute: 60 {cells}/uL (ref 0–200)
Basophils Relative: 1.5 %
Eosinophils Absolute: 192 {cells}/uL (ref 15–500)
Eosinophils Relative: 4.8 %
HCT: 41.2 % (ref 35.0–45.0)
Hemoglobin: 13.2 g/dL (ref 11.7–15.5)
MCH: 27.5 pg (ref 27.0–33.0)
MCHC: 32 g/dL (ref 32.0–36.0)
MCV: 85.8 fL (ref 80.0–100.0)
MPV: 10.4 fL (ref 7.5–12.5)
Monocytes Relative: 7.3 %
Neutro Abs: 2684 {cells}/uL (ref 1500–7800)
Neutrophils Relative %: 67.1 %
Platelets: 280 10*3/uL (ref 140–400)
RBC: 4.8 10*6/uL (ref 3.80–5.10)
RDW: 13 % (ref 11.0–15.0)
Total Lymphocyte: 19.3 %
WBC: 4 10*3/uL (ref 3.8–10.8)

## 2023-02-21 LAB — VITAMIN D 1,25 DIHYDROXY
Vitamin D 1, 25 (OH)2 Total: 115 pg/mL — ABNORMAL HIGH (ref 18–72)
Vitamin D2 1, 25 (OH)2: 76 pg/mL
Vitamin D3 1, 25 (OH)2: 39 pg/mL

## 2023-02-21 LAB — VITAMIN D 25 HYDROXY (VIT D DEFICIENCY, FRACTURES): Vit D, 25-Hydroxy: 53 ng/mL (ref 30–100)

## 2023-02-21 LAB — ANGIOTENSIN CONVERTING ENZYME: Angiotensin-Converting Enzyme: 156 U/L — ABNORMAL HIGH (ref 9–67)

## 2023-02-21 NOTE — Telephone Encounter (Signed)
Patient called and would like to know if she needs to go back on prednisone based on recent labs from 02/16/2023 and if the dose of imuran could be adjusted as well based on conversation at the appointment? Please advise. Thanks!

## 2023-02-22 ENCOUNTER — Ambulatory Visit (INDEPENDENT_AMBULATORY_CARE_PROVIDER_SITE_OTHER): Payer: Managed Care, Other (non HMO) | Admitting: Family Medicine

## 2023-02-22 ENCOUNTER — Encounter (INDEPENDENT_AMBULATORY_CARE_PROVIDER_SITE_OTHER): Payer: Self-pay | Admitting: Family Medicine

## 2023-02-22 VITALS — BP 142/82 | HR 81 | Temp 98.6°F | Ht 65.0 in | Wt 221.0 lb

## 2023-02-22 DIAGNOSIS — Z6836 Body mass index (BMI) 36.0-36.9, adult: Secondary | ICD-10-CM

## 2023-02-22 DIAGNOSIS — F419 Anxiety disorder, unspecified: Secondary | ICD-10-CM

## 2023-02-22 DIAGNOSIS — J45909 Unspecified asthma, uncomplicated: Secondary | ICD-10-CM | POA: Diagnosis not present

## 2023-02-22 DIAGNOSIS — R5383 Other fatigue: Secondary | ICD-10-CM | POA: Diagnosis not present

## 2023-02-22 DIAGNOSIS — I10 Essential (primary) hypertension: Secondary | ICD-10-CM | POA: Diagnosis not present

## 2023-02-22 DIAGNOSIS — Z1331 Encounter for screening for depression: Secondary | ICD-10-CM | POA: Diagnosis not present

## 2023-02-22 DIAGNOSIS — E559 Vitamin D deficiency, unspecified: Secondary | ICD-10-CM

## 2023-02-22 DIAGNOSIS — R0602 Shortness of breath: Secondary | ICD-10-CM | POA: Diagnosis not present

## 2023-02-22 DIAGNOSIS — E669 Obesity, unspecified: Secondary | ICD-10-CM

## 2023-02-22 NOTE — Progress Notes (Signed)
Carlye Grippe, D.O.  ABFM, ABOM Specializing in Clinical Bariatric Medicine Office located at: [address] [place], [place]  [address]   Bariatric Medicine Visit  Dear Zola Button, Grayling Congress, *   Thank you for referring RAIVYN BARBOT to our clinic today for evaluation.  We performed a consultation to discuss her options for treatment and educate the patient on her disease state.  The following note includes my evaluation and treatment recommendations.   Please do not hesitate to reach out to me directly if you have any further concerns.   Assessment and Plan:   FOR THE DISEASE OF OBESITY: BMI 36.0-36.9,adult Generalized obesity-starting bmi 02/22/23  36.78 Assessment & Plan:  Recommended Dietary Goals Angeliqua is currently in the action stage of change. As such, her goal is to continue weight management plan.  She has agreed to: follow the Category 2 plan - 1200 kcal per day   Behavioral Intervention We discussed the following Behavioral Modification Strategies today: begin to work on maintaining a reduced calorie state, getting the recommended amount of protein, incorporating whole foods, making healthy choices, staying well hydrated and practicing mindfulness when eating, and exploring long life meal prep.   Additional resources provided today: handout on CAT 2 meal plan  Evidence-based interventions for health behavior change were utilized today including the discussion of self monitoring techniques, problem-solving barriers and SMART goal setting techniques.   Regarding patient's less desirable eating habits and patterns, we employed the technique of small changes.   Pt will specifically work on: n/a   Recommended Physical Activity Goals Breezy has been advised to work up to 150 minutes of moderate intensity aerobic activity a week and strengthening exercises 2-3 times per week for cardiovascular health, weight loss maintenance and preservation of muscle mass.    She has agreed to : Continue current level of physical activity    Pharmacotherapy We both agreed to : begin with nutritional and behavioral strategies   FOR ASSOCIATED CONDITIONS ADDRESSED TODAY:  Other Fatigue Assessment & Plan: Merecedes does feel that her weight is causing her energy to be lower than it should be. Fatigue may be related to obesity, depression or many other causes. she does not appear to have any red flag symptoms and this appears to most likely be related to her current lifestyle habits and dietary intake.  Labs will be ordered and reviewed with her at their next office visit in two weeks.  Epworth sleepiness scale is 11 and does not appear to be within normal limits. Gearline admits to some daytime somnolence and admits to sometimes waking up still tired. Avian generally gets 7 hours of sleep per night, and states that she has generally restful sleep. Snoring is present. Apneic episodes is not present.   Educated pt on the potential detrimental health effects of poorly controlled sleep apnea. She agrees to discuss her ESS results further with PCP at 02/28/23 OV.   ECG: Performed and reviewed/ interpreted independently.  Normal sinus rhythm, rate 83 bpm; reassuring without any acute abnormalities, will continue to monitor for symptoms   Orders: -     Folate -     Hemoglobin A1c -     Insulin, random -     Lipid Panel With LDL/HDL Ratio -     T4, free -     TSH -     Vitamin B12 -     EKG 12-Lead   Shortness of breath on exertion Assessment & Plan:  Tanaia does feel that she gets out of breath more easily than she used to when she exercises and seems to be worsening over time with weight gain.  This has gotten worse recently. Raini denies shortness of breath at rest or orthopnea. Lorilyn's shortness of breath appears to be obesity related and exercise induced, as they do not appear to have any "red flag" symptoms/ concerns today.  Also, this condition appears to be  related to a state of poor cardiovascular conditioning   Obtain labs today and will be reviewed with her at their next office visit in two weeks.  Indirect Calorimeter completed today to help guide our dietary regimen. It shows a VO2 of 276 and a REE of 1901.  Her calculated basal metabolic rate is 9604 thus her measured basal metabolic rate is better than expected.  Patient agreed to work on weight loss at this time.  As Joletta progresses through our weight loss program, we will gradually increase exercise as tolerated to treat her current condition.   If Gordana follows our recommendations and loses 5-10% of their weight without improvement of her shortness of breath or if at any time, symptoms become more concerning, they agree to urgently follow up with their PCP/ specialist for further consideration/ evaluation.   Tifini verbalizes agreement with this plan.    Depression screen [Z13.31] Assessment & Plan: Ruth [name] had a positive depression screening of 10. She denies SI/HI. Depression is commonly associated with obesity and often results in emotional eating behaviors.    We will monitor this closely and work on CBT to help improve the non-hunger eating patterns. She will be referred to bariatric psychology.     Anxious mood - emotional eating Assessment & Plan: Pt has never been on a mood medication. She endorses feeling anxious lately due to the loss of her close friend of 35 years. Additionally, she reports not eating when stressed. However when she is happy or bored, she tends to eat.   Pt is agreeable to meeting with Dr.Barker; referral made today. Reminded patient of the importance of following their prudent nutrition plan and how food can affect mood as well to support emotional wellbeing. Will begin to monitor condition.    Vitamin D deficiency Assessment & Plan: Most recent vitamin D:  Lab Results  Component Value Date   VD25OH 53 02/16/2023   VD25OH 69 07/22/2022   VD25OH 29.88  (L) 03/31/2021   She is currently on ERGO 50,000 units once a week per her Rheumatologist Dr.Rice. She will continue with high dose vitamin D and start weight loss efforts.    Essential hypertension Assessment & Plan: Last 3 blood pressure readings in our office are as follows: BP Readings from Last 3 Encounters:  02/22/23 (!) 142/82  02/16/23 131/83  12/16/22 136/82   Pt with hx of cerebral aneurysm in 202 -  has been on Amlodipine 5 mg daily since then. Her blood pressure is above target today - she feels that this could be secondary to being nervous about her appointment today.   Continue with antihypertensive at current dose. Encouraged pt to check blood pressure 2-3 times a week at home. Begin Prudent nutritional plan and low sodium diet, advance exercise as tolerated.     Moderate asthma, unspecified whether complicated, unspecified whether persistent Assessment & Plan: Condition treated by PCP. Pt states that her asthma is secondary to allergies. She feels that symptoms are controlled on Azelastine, Breo Ellipta, & Albuterol.   She will continue  with treatment per PCP. F/up with them as directed. Begin low inflammatory meal plan.     FOLLOW UP:   Follow up in 2 weeks. She was informed of the importance of frequent follow up visits to maximize her success with intensive lifestyle modifications for her multiple health conditions.  Shaquia Cooperman Newstrom is aware that we will review all of her lab results at our next visit.  She is aware that if anything is critical/ life threatening with the results, we will be contacting her via MyChart prior to the office visit to discuss management.    Chief Complaint:   OBESITY AYEZA DOBBYN (MR# [id number]) is a pleasant 62 y.o. female who presents for evaluation and treatment of obesity and related comorbidities. Current BMI is Body mass index is 36.78 kg/m. Pati Darrah Tokarz has been struggling with her weight for many years and has been  unsuccessful in either losing weight, maintaining weight loss, or reaching her healthy weight goal.  Caragh Walstrom Coulibaly is currently in the action stage of change and ready to dedicate time achieving and maintaining a healthier weight. Kymesha Achorn Barkalow is interested in becoming our patient and working on intensive lifestyle modifications including (but not limited to) diet and exercise for weight loss.  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 Patient is single and lives with her 3 y.o daughter.   She was previously in our program from 2019-2020.    Desires to lose 40 lbs in 6 months to mainly alleviate knee pain.   Eats outside the home 7 days a week.  Daughter does the grocery shopping at home and cooks.   Craves sweets, burgers, fries, fried chicken - especially after dinner.   Snacks on ice-cream, chocolate, and potatoe chips.   Skips breakfast and dinner sometimes.   Drinks sweet tea with sugar.   Worst food habit: eating outside the home regularly.   Pt has upcoming trip to [place].   Subjective:   This is the patient's first visit at [facility].  The patient's NEW PATIENT PACKET that they filled out prior to today's office visit was reviewed at length and information from that paperwork was included within the following office visit note.    Included in the packet: current and past health history, medications, allergies, ROS, gynecologic history (women only), surgical history, family history, social history, weight history, weight loss surgery history (for those that have had weight loss surgery), nutritional evaluation, mood and food questionnaire along with a depression screening (PHQ9) on all patients, an Epworth questionnaire, sleep habits questionnaire, patient life and health improvement goals questionnaire. These will all be scanned into the patient's chart under the "media" tab.   Review of Systems: Please refer to  new patient packet scanned into media. Pertinent positives were addressed with patient today.  Reviewed by clinician on day of visit: allergies, medications, problem list, medical history, surgical history, family history, social history, and previous encounter notes.  During the visit, I independently reviewed the patient's EKG, bioimpedance scale results, and indirect calorimeter results. I used this information to tailor a meal plan for the patient that will help Giulietta M Nell to lose weight and will improve her obesity-related conditions going forward.  I performed a medically necessary appropriate examination and/or evaluation. I discussed the assessment and treatment plan with the patient. The patient was provided an opportunity to ask questions and all were answered. The patient agreed with the plan and  demonstrated an understanding of the instructions. Labs were ordered today (unless patient declined them) and will be reviewed with the patient at our next visit unless more critical results need to be addressed immediately. Clinical information was updated and documented in the EMR.   Objective:   PHYSICAL EXAM: Blood pressure (!) 142/82, pulse 81, temperature 98.6 F (37 C), height 5\' 5"  (1.651 m), weight 221 lb (100.2 kg), last menstrual period 09/19/2015, SpO2 100%. Body mass index is 36.78 kg/m.  General: Well Developed, well nourished, and in no acute distress.  HEENT: Normocephalic, atraumatic; EOMI, sclerae are anicteric. Skin: Warm and dry, good turgor Chest:  Normal excursion, shape, no gross ABN Respiratory: No conversational dyspnea; speaking in full sentences NeuroM-Sk:  Normal gross ROM * 4 extremities  Psych: A and O *3, insight adequate, mood- full   Anthropometric Measurements Height: 5\' 5"  (1.651 m) Weight: 221 lb (100.2 kg) BMI (Calculated): 36.78 Weight at Last Visit: na Weight Lost Since Last Visit: na Weight Gained Since Last Visit: na Starting Weight:  221lb Total Weight Loss (lbs): 0 lb (0 kg) Peak Weight: 230lb Waist Measurement : 43 inches   Body Composition  Body Fat %: 45.1 % Fat Mass (lbs): 100 lbs Muscle Mass (lbs): 115.6 lbs Total Body Water (lbs): 79 lbs Visceral Fat Rating : 13   Other Clinical Data RMR: 1901 Fasting: yes Labs: yes Today's Visit #: 1 Starting Date: 02/22/23 Comments: first visit   DIAGNOSTIC DATA REVIEWED:  BMET    Component Value Date/Time   NA 141 02/16/2023 0833   NA 144 10/05/2018 1004   K 3.9 02/16/2023 0833   CL 105 02/16/2023 0833   CO2 27 02/16/2023 0833   GLUCOSE 93 02/16/2023 0833   BUN 12 02/16/2023 0833   BUN 16 10/05/2018 1004   CREATININE 0.95 02/16/2023 0833   CALCIUM 9.6 02/16/2023 0833   GFRNONAA >60 02/16/2022 1350   GFRAA >60 07/21/2019 0937   Lab Results  Component Value Date   HGBA1C 5.6 03/05/2019   HGBA1C 5.7 (H) 07/21/2017   Lab Results  Component Value Date   INSULIN 9.5 10/05/2018   INSULIN 15.0 07/21/2017   Lab Results  Component Value Date   TSH 0.96 01/19/2022   CBC    Component Value Date/Time   WBC 4.0 02/16/2023 0833   RBC 4.80 02/16/2023 0833   HGB 13.2 02/16/2023 0833   HGB 13.1 02/16/2022 1350   HGB 14.2 04/13/2018 1001   HCT 41.2 02/16/2023 0833   HCT 43.1 04/13/2018 1001   PLT 280 02/16/2023 0833   PLT 240 02/16/2022 1350   PLT 235 04/13/2018 1001   MCV 85.8 02/16/2023 0833   MCV 85 04/13/2018 1001   MCH 27.5 02/16/2023 0833   MCHC 32.0 02/16/2023 0833   RDW 13.0 02/16/2023 0833   RDW 13.6 04/13/2018 1001   Iron Studies No results found for: "IRON", "TIBC", "FERRITIN", "IRONPCTSAT" Lipid Panel     Component Value Date/Time   CHOL 156 01/19/2022 1402   CHOL 174 10/05/2018 1004   TRIG 60.0 01/19/2022 1402   HDL 63.10 01/19/2022 1402   HDL 61 10/05/2018 1004   CHOLHDL 2 01/19/2022 1402   VLDL 12.0 01/19/2022 1402   LDLCALC 81 01/19/2022 1402   LDLCALC 123 (H) 01/04/2020 1019   Hepatic Function Panel     Component  Value Date/Time   PROT 7.9 02/16/2023 0833   PROT 7.2 10/05/2018 1004   ALBUMIN 4.0 02/16/2022 1350   ALBUMIN 4.3 10/05/2018  1004   AST 25 02/16/2023 0833   AST 36 02/16/2022 1350   ALT 15 02/16/2023 0833   ALT 28 02/16/2022 1350   ALKPHOS 221 (H) 02/16/2022 1350   BILITOT 0.4 02/16/2023 0833   BILITOT 0.8 02/16/2022 1350   BILIDIR 0.1 02/23/2021 0927   IBILI 0.3 08/24/2013 1633      Component Value Date/Time   TSH 0.96 01/19/2022 1402   Nutritional Lab Results  Component Value Date   VD25OH 53 02/16/2023   VD25OH 69 07/22/2022   VD25OH 29.88 (L) 03/31/2021    Attestation Statements:   I, Special Puri, acting as a Stage manager for Thomasene Lot, DO., have compiled all relevant documentation for today's office visit on behalf of Thomasene Lot, DO, while in the presence of [employer], DO.  Reviewed by clinician on day of visit: allergies, medications, problem list, medical history, surgical history, family history, social history, and previous encounter notes pertinent to patient's obesity diagnosis. I have spent 52 minutes in the care of the patient today including: preparing to see patient (e.g. review and interpretation of tests, old notes ), obtaining and/or reviewing separately obtained history, performing a medically appropriate examination or evaluation, counseling and educating the patient, ordering medications, test or procedures, documenting clinical information in the electronic or other health care record, and independently interpreting results and communicating results to the patient, family, or caregiver   I have reviewed the above documentation for accuracy and completeness, and I agree with the above. Carlye Grippe, D.O.  The 21st Century Cures Act was signed into law in 2016 which includes the topic of electronic health records.  This provides immediate access to information in MyChart.  This includes consultation notes, operative notes, office  notes, lab results and pathology reports.  If you have any questions about what you read please let us know at your next visit so we can discuss your concerns and take corrective action if need be.  We are right here with you.

## 2023-02-23 LAB — LIPID PANEL WITH LDL/HDL RATIO
Cholesterol, Total: 177 mg/dL (ref 100–199)
HDL: 66 mg/dL (ref >39)
LDL Chol Calc (NIH): 97 mg/dL (ref 0–99)
LDL/HDL Ratio: 1.5 {ratio} (ref 0.0–3.2)
Triglycerides: 73 mg/dL (ref 0–149)
VLDL Cholesterol Cal: 14 mg/dL (ref 5–40)

## 2023-02-23 LAB — VITAMIN B12: Vitamin B-12: 744 pg/mL (ref 232–1245)

## 2023-02-23 LAB — HEMOGLOBIN A1C
Est. average glucose Bld gHb Est-mCnc: 120 mg/dL
Hgb A1c MFr Bld: 5.8 % — ABNORMAL HIGH (ref 4.8–5.6)

## 2023-02-23 LAB — TSH: TSH: 1.31 u[IU]/mL (ref 0.450–4.500)

## 2023-02-23 LAB — INSULIN, RANDOM: INSULIN: 8.4 u[IU]/mL (ref 2.6–24.9)

## 2023-02-23 LAB — T4, FREE: Free T4: 1.4 ng/dL (ref 0.82–1.77)

## 2023-02-23 LAB — FOLATE: Folate: 13.9 ng/mL (ref >3.0)

## 2023-02-28 ENCOUNTER — Encounter: Payer: Self-pay | Admitting: Family Medicine

## 2023-02-28 ENCOUNTER — Ambulatory Visit: Payer: Managed Care, Other (non HMO) | Admitting: Family Medicine

## 2023-02-28 DIAGNOSIS — R1013 Epigastric pain: Secondary | ICD-10-CM

## 2023-02-28 DIAGNOSIS — I10 Essential (primary) hypertension: Secondary | ICD-10-CM

## 2023-02-28 DIAGNOSIS — E559 Vitamin D deficiency, unspecified: Secondary | ICD-10-CM

## 2023-02-28 DIAGNOSIS — J302 Other seasonal allergic rhinitis: Secondary | ICD-10-CM | POA: Diagnosis not present

## 2023-02-28 DIAGNOSIS — Z0000 Encounter for general adult medical examination without abnormal findings: Secondary | ICD-10-CM | POA: Diagnosis not present

## 2023-02-28 DIAGNOSIS — J4541 Moderate persistent asthma with (acute) exacerbation: Secondary | ICD-10-CM | POA: Diagnosis not present

## 2023-02-28 MED ORDER — AZELASTINE HCL 0.1 % NA SOLN: 1.0000 | Freq: Two times a day (BID) | NASAL | 12 refills | Status: AC

## 2023-02-28 MED ORDER — AMLODIPINE BESYLATE 5 MG PO TABS: ORAL_TABLET | ORAL | 0 refills | Status: DC

## 2023-02-28 MED ORDER — PANTOPRAZOLE SODIUM 40 MG PO TBEC: 40.0000 mg | DELAYED_RELEASE_TABLET | Freq: Every day | ORAL | 3 refills | Status: AC

## 2023-02-28 MED ORDER — FLUTICASONE FUROATE-VILANTEROL 100-25 MCG/ACT IN AEPB: 1.0000 | INHALATION_SPRAY | Freq: Every day | RESPIRATORY_TRACT | 2 refills | Status: DC

## 2023-02-28 MED ORDER — ALBUTEROL SULFATE HFA 108 (90 BASE) MCG/ACT IN AERS: 2.0000 | INHALATION_SPRAY | Freq: Four times a day (QID) | RESPIRATORY_TRACT | 1 refills | Status: AC | PRN

## 2023-02-28 MED ORDER — VITAMIN D (ERGOCALCIFEROL) 1.25 MG (50000 UNIT) PO CAPS: 50000.0000 [IU] | ORAL_CAPSULE | ORAL | 2 refills | Status: DC

## 2023-02-28 NOTE — Patient Instructions (Signed)
Preventive Care 40-60 Years Old, Female Preventive care refers to lifestyle choices and visits with your health care provider that can promote health and wellness. Preventive care visits are also called wellness exams. What can I expect for my preventive care visit? Counseling Your health care provider may ask you questions about your: Medical history, including: Past medical problems. Family medical history. Pregnancy history. Current health, including: Menstrual cycle. Method of birth control. Emotional well-being. Home life and relationship well-being. Sexual activity and sexual health. Lifestyle, including: Alcohol, nicotine or tobacco, and drug use. Access to firearms. Diet, exercise, and sleep habits. Work and work environment. Sunscreen use. Safety issues such as seatbelt and bike helmet use. Physical exam Your health care provider will check your: Height and weight. These may be used to calculate your BMI (body mass index). BMI is a measurement that tells if you are at a healthy weight. Waist circumference. This measures the distance around your waistline. This measurement also tells if you are at a healthy weight and may help predict your risk of certain diseases, such as type 2 diabetes and high blood pressure. Heart rate and blood pressure. Body temperature. Skin for abnormal spots. What immunizations do I need?  Vaccines are usually given at various ages, according to a schedule. Your health care provider will recommend vaccines for you based on your age, medical history, and lifestyle or other factors, such as travel or where you work. What tests do I need? Screening Your health care provider may recommend screening tests for certain conditions. This may include: Lipid and cholesterol levels. Diabetes screening. This is done by checking your blood sugar (glucose) after you have not eaten for a while (fasting). Pelvic exam and Pap test. Hepatitis B test. Hepatitis C  test. HIV (human immunodeficiency virus) test. STI (sexually transmitted infection) testing, if you are at risk. Lung cancer screening. Colorectal cancer screening. Mammogram. Talk with your health care provider about when you should start having regular mammograms. This may depend on whether you have a family history of breast cancer. BRCA-related cancer screening. This may be done if you have a family history of breast, ovarian, tubal, or peritoneal cancers. Bone density scan. This is done to screen for osteoporosis. Talk with your health care provider about your test results, treatment options, and if necessary, the need for more tests. Follow these instructions at home: Eating and drinking  Eat a diet that includes fresh fruits and vegetables, whole grains, lean protein, and low-fat dairy products. Take vitamin and mineral supplements as recommended by your health care provider. Do not drink alcohol if: Your health care provider tells you not to drink. You are pregnant, may be pregnant, or are planning to become pregnant. If you drink alcohol: Limit how much you have to 0-1 drink a day. Know how much alcohol is in your drink. In the [place] one drink equals one 12 oz bottle of beer (355 mL), one 5 oz glass of wine (148 mL), or one 1 oz glass of hard liquor (44 mL). Lifestyle Brush your teeth every morning and night with fluoride toothpaste. Floss one time each day. Exercise for at least 30 minutes 5 or more days each week. Do not use any products that contain nicotine or tobacco. These products include cigarettes, chewing tobacco, and vaping devices, such as e-cigarettes. If you need help quitting, ask your health care provider. Do not use drugs. If you are sexually active, practice safe sex. Use a condom or other form of protection to   prevent STIs. If you do not wish to become pregnant, use a form of birth control. If you plan to become pregnant, see your health care provider for a  prepregnancy visit. Take aspirin only as told by your health care provider. Make sure that you understand how much to take and what form to take. Work with your health care provider to find out whether it is safe and beneficial for you to take aspirin daily. Find healthy ways to manage stress, such as: Meditation, yoga, or listening to music. Journaling. Talking to a trusted person. Spending time with friends and family. Minimize exposure to UV radiation to reduce your risk of skin cancer. Safety Always wear your seat belt while driving or riding in a vehicle. Do not drive: If you have been drinking alcohol. Do not ride with someone who has been drinking. When you are tired or distracted. While texting. If you have been using any mind-altering substances or drugs. Wear a helmet and other protective equipment during sports activities. If you have firearms in your house, make sure you follow all gun safety procedures. Seek help if you have been physically or sexually abused. What's next? Visit your health care provider once a year for an annual wellness visit. Ask your health care provider how often you should have your eyes and teeth checked. Stay up to date on all vaccines. This information is not intended to replace advice given to you by your health care provider. Make sure you discuss any questions you have with your health care provider. Document Revised: 09/10/2020 Document Reviewed: 09/10/2020 Elsevier Patient Education  2024 [employer]  

## 2023-02-28 NOTE — Progress Notes (Signed)
Established Patient Office Visit  Subjective   Patient ID: Ruth [name], female    DOB: 1960-09-06  Age: 62 y.o. MRN: [id number]  Chief Complaint  Patient presents with   Annual Exam    Pt states not fasting     HPI Discussed the use of AI scribe software for clinical note transcription with the patient, who gave verbal consent to proceed.  History of Present Illness   The patient, with a known diagnosis of sarcoidosis, presents with ongoing joint aches attributed to the condition. She reports no changes in her asthma symptoms and is currently on a daily regimen of Breo. She also takes Pantoprazole daily for stomach issues, which she reports as being well-managed with the medication.  The patient recently returned to a healthy weight management program due to weight gain associated with prednisone use. She expresses dissatisfaction with her current rheumatologist, citing issues with follow-through and frequent absences. She is currently seeking a new rheumatologist and is on a cancellation list for an appointment in November 2025.  The patient's most recent labs were conducted at a different facility and are reported to be satisfactory. She is due for a mammogram in April and a Pap smear in March. She declined a flu shot and shingles vaccine due to her current health status. Her next Cologuard test is due next year.  The patient is currently managing her sarcoidosis symptoms but is seeking better care and management strategies due to dissatisfaction with her current rheumatologist. She is proactive in her care, often initiating calls to her healthcare providers to follow up on labs and medication changes.         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/07/2020   Preventative health care 07/07/2020   SAH (subarachnoid hemorrhage) (HCC) 07/21/2019   RUQ pain 06/18/2019   Dyspepsia 06/18/2019   Moderate persistent asthma with acute exacerbation 03/05/2019   Vitamin D deficiency 07/03/2018   Depression 07/03/2018   Class 2 severe obesity with serious comorbidity and body mass index (BMI) of 37.0 to 37.9 in adult [facility]) 07/03/2018   Essential hypertension 09/19/2017   Aneurysm, cerebral, nonruptured 05/06/2015   White matter abnormality on MRI of brain 05/06/2015   Migraine headache without aura 05/06/2015   Dizziness 05/06/2015   Foreign body of ear, left 09/05/2012   HTN (hypertension) 06/12/2012   Allergic rhinitis due to pollen 12/27/2007   Allergic-infective asthma 08/29/2006   ECZEMA 08/29/2006   Past Medical History:  Diagnosis Date   Allergic rhinitis, cause unspecified    Asthma    Constipation    Eczema    Food allergy    Gallbladder problem    Headache    Hypertension    Joint pain    Vitamin D deficiency    Past Surgical History:  Procedure Laterality Date   BRAIN SURGERY     CESAREAN SECTION Bilateral [id number]   Social History   Tobacco Use   Smoking status: Never    Passive exposure: Never   Smokeless tobacco: Never  Vaping Use   Vaping status: Never Used  Substance Use Topics   Alcohol use: Yes    Comment: ocassionally   Drug  use: No   Social History   Socioeconomic History   Marital status: Single    Spouse name: Not on file   Number of children: Not on file   Years of education: Not on file   Highest education level: Not on file  Occupational History   Occupation: Juvenile Counsler    Comment: guard   Tobacco Use   Smoking status: Never    Passive exposure: Never   Smokeless tobacco: Never  Vaping Use   Vaping status: Never Used  Substance and Sexual Activity   Alcohol use: Yes     Comment: ocassionally   Drug use: No   Sexual activity: Yes    Partners: Male  Other Topics Concern   Not on file  Social History Narrative   Exercise--no   Right handed   Caffiene1 cup   Lives alone and a two story home   Social Determinants of Health   Financial Resource Strain: Not on file  Food Insecurity: Not on file  Transportation Needs: Not on file  Physical Activity: Not on file  Stress: Not on file  Social Connections: Not on file  Intimate Partner Violence: Not on file   Family Status  Relation Name Status   Mother  Alive   Father  Deceased   Sister  Alive   Sister  Alive   Brother  Alive  No partnership data on file   Family History  Problem Relation Age of Onset   Asthma Mother    Allergies Mother    Asthma Father    Drug abuse Father    Allergies Father    Allergies  Allergen Reactions   Other Anaphylaxis    Nuts    Chlorhexidine Gluconate Itching      ROS    Objective:     BP (!) 128/98 (BP Location: Left Arm, Patient Position: Sitting, Cuff Size: Large)   Pulse (!) 101   Temp 98.6 F (37 C) (Oral)   Resp 18   Ht 5\' 5"  (1.651 m)   Wt 222 lb (100.7 kg)   LMP 09/19/2015   SpO2 99%   BMI 36.94 kg/m  BP Readings from Last 3 Encounters:  02/28/23 (!) 128/98  02/22/23 (!) 142/82  02/16/23 131/83   Wt Readings from Last 3 Encounters:  02/28/23 222 lb (100.7 kg)  02/22/23 221 lb (100.2 kg)  02/16/23 228 lb (103.4 kg)   SpO2 Readings from Last 3 Encounters:  02/28/23 99%  02/22/23 100%  05/18/22 99%      Physical Exam Vitals and nursing note reviewed.  Constitutional:      General: She is not in acute distress.    Appearance: Normal appearance. She is well-developed.  HENT:     Head: Normocephalic and atraumatic.     Right Ear: Tympanic membrane, ear canal and external ear normal. There is no impacted cerumen.     Left Ear: Tympanic membrane, ear canal and external ear normal. There is no impacted cerumen.     Nose: Nose  normal.     Mouth/Throat:     Mouth: Mucous membranes are moist.     Pharynx: Oropharynx is clear. No oropharyngeal exudate or posterior oropharyngeal erythema.  Eyes:     General: No scleral icterus.       Right eye: No discharge.        Left eye: No discharge.     Conjunctiva/sclera: Conjunctivae normal.     Pupils: Pupils are equal, round, and reactive to  light.  Neck:     Thyroid: No thyromegaly or thyroid tenderness.     Vascular: No JVD.  Cardiovascular:     Rate and Rhythm: Normal rate and regular rhythm.     Heart sounds: Normal heart sounds. No murmur heard. Pulmonary:     Effort: Pulmonary effort is normal. No respiratory distress.     Breath sounds: Normal breath sounds.  Abdominal:     General: Bowel sounds are normal. There is no distension.     Palpations: Abdomen is soft. There is no mass.     Tenderness: There is no abdominal tenderness. There is no guarding or rebound.  Genitourinary:    Vagina: Normal.  Musculoskeletal:        General: Normal range of motion.     Cervical back: Normal range of motion and neck supple.     Right lower leg: No edema.     Left lower leg: No edema.  Lymphadenopathy:     Cervical: No cervical adenopathy.  Skin:    General: Skin is warm and dry.     Findings: No erythema or rash.  Neurological:     Mental Status: She is alert and oriented to person, place, and time.     Cranial Nerves: No cranial nerve deficit.     Deep Tendon Reflexes: Reflexes are normal and symmetric.  Psychiatric:        Mood and Affect: Mood normal.        Behavior: Behavior normal.        Thought Content: Thought content normal.        Judgment: Judgment normal.      No results found for any visits on 02/28/23.  Last CBC Lab Results  Component Value Date   WBC 4.0 02/16/2023   HGB 13.2 02/16/2023   HCT 41.2 02/16/2023   MCV 85.8 02/16/2023   MCH 27.5 02/16/2023   RDW 13.0 02/16/2023   PLT 280 02/16/2023   Last metabolic panel Lab Results   Component Value Date   GLUCOSE 93 02/16/2023   NA 141 02/16/2023   K 3.9 02/16/2023   CL 105 02/16/2023   CO2 27 02/16/2023   BUN 12 02/16/2023   CREATININE 0.95 02/16/2023   EGFR 69 02/16/2023   CALCIUM 9.6 02/16/2023   PROT 7.9 02/16/2023   ALBUMIN 4.0 02/16/2022   LABGLOB 2.9 10/05/2018   AGRATIO 1.5 10/05/2018   BILITOT 0.4 02/16/2023   ALKPHOS 221 (H) 02/16/2022   AST 25 02/16/2023   ALT 15 02/16/2023   ANIONGAP 11 02/16/2022   Last lipids Lab Results  Component Value Date   CHOL 177 02/22/2023   HDL 66 02/22/2023   LDLCALC 97 02/22/2023   TRIG 73 02/22/2023   CHOLHDL 2 01/19/2022   Last hemoglobin A1c Lab Results  Component Value Date   HGBA1C 5.8 (H) 02/22/2023   Last thyroid functions Lab Results  Component Value Date   TSH 1.310 02/22/2023   T3TOTAL 105 04/13/2018   Last vitamin D Lab Results  Component Value Date   VD25OH 53 02/16/2023   Last vitamin B12 and Folate Lab Results  Component Value Date   VITAMINB12 744 02/22/2023   FOLATE 13.9 02/22/2023      The 10-year ASCVD risk score (Arnett DK, et al., 2019) is: 5.1%    Assessment & Plan:   Problem List Items Addressed This Visit       Unprioritized   Vitamin D deficiency   Relevant Medications  Vitamin D, Ergocalciferol, (DRISDOL) 1.25 MG (50000 UNIT) CAPS capsule   Dyspepsia   Relevant Medications   pantoprazole (PROTONIX) 40 MG tablet   Moderate persistent asthma with acute exacerbation   Relevant Medications   albuterol (VENTOLIN HFA) 108 (90 Base) MCG/ACT inhaler   fluticasone furoate-vilanterol (BREO ELLIPTA) 100-25 MCG/ACT AEPB   HTN (hypertension)   Relevant Medications   amLODipine (NORVASC) 5 MG tablet   Other Visit Diagnoses     Seasonal allergies       Relevant Medications   azelastine (ASTELIN) 0.1 % nasal spray     Assessment and Plan    Sarcoidosis   She expresses dissatisfaction with her current rheumatologist, citing poor follow-up and communication,  which has led to delays in medication adjustments and treatment uncertainty. She has an appointment with a new rheumatologist in [facility] scheduled for November 2025 but is on a cancellation list and is seeking a referral to a rheumatologist in [place]. We will refer her to a rheumatologist in [place].  Asthma   Her asthma is well-controlled with the current medication regimen, reporting good control with Breo. We will refill Breo as needed and continue the current asthma management plan.  Gastroesophageal Reflux Disease (GERD)   Her GERD is well-managed on the current medication regimen. We will refill pantoprazole as needed.  General Health Maintenance   Routine health maintenance was discussed. She declined the shingles vaccine due to current health issues. A mammogram and Pap smear are due in the coming months, with a Cologuard screening due next year. We will schedule a mammogram in April and a Pap smear in March. Cologuard screening is planned for next year.  Follow-up   She should follow up with primary care as needed and will be referred to a new rheumatologist in [place].        Return in about 6 months (around 08/29/2023), or if symptoms worsen or fail to improve.    Donato Schultz, DO

## 2023-02-28 NOTE — Telephone Encounter (Signed)
Patient states she is travelling for a week. Patient is going to [place]. Patient will not be accessible by phone. Patient states she was trying to get a response regarding her labs prior to leaving. Patient states she is leaving tomorrow.

## 2023-03-05 ENCOUNTER — Other Ambulatory Visit: Payer: Self-pay | Admitting: Family Medicine

## 2023-03-05 DIAGNOSIS — I10 Essential (primary) hypertension: Secondary | ICD-10-CM

## 2023-03-06 ENCOUNTER — Encounter: Payer: Self-pay | Admitting: Internal Medicine

## 2023-03-07 MED ORDER — AZATHIOPRINE 50 MG PO TABS: 100.0000 mg | ORAL_TABLET | Freq: Every day | ORAL | 0 refills | Status: DC

## 2023-03-07 MED ORDER — PREDNISONE 10 MG PO TABS: 10.0000 mg | ORAL_TABLET | Freq: Every day | ORAL | 1 refills | Status: DC

## 2023-03-07 NOTE — Progress Notes (Signed)
Activated vitamin D is high at 115 and ACE level is high at 156 these indicate active sarcoidosis.  So the azathioprine by itself is not controlling disease as well as she was doing on the 10 mg prednisone. Lymphocyte count is slightly low at 772 probably effect of the azathioprine but total white count is okay.

## 2023-03-07 NOTE — Addendum Note (Signed)
Addended by: Fuller Plan on: 03/07/2023 11:55 AM   Modules accepted: Orders

## 2023-03-08 ENCOUNTER — Ambulatory Visit (INDEPENDENT_AMBULATORY_CARE_PROVIDER_SITE_OTHER): Payer: Managed Care, Other (non HMO) | Admitting: Family Medicine

## 2023-03-08 ENCOUNTER — Encounter (INDEPENDENT_AMBULATORY_CARE_PROVIDER_SITE_OTHER): Payer: Self-pay | Admitting: Family Medicine

## 2023-03-08 ENCOUNTER — Other Ambulatory Visit: Payer: Self-pay | Admitting: Pharmacist

## 2023-03-08 ENCOUNTER — Telehealth: Payer: Self-pay | Admitting: Pharmacist

## 2023-03-08 VITALS — BP 136/84 | HR 84 | Temp 98.3°F | Ht 65.0 in | Wt 220.0 lb

## 2023-03-08 DIAGNOSIS — R0602 Shortness of breath: Secondary | ICD-10-CM | POA: Diagnosis not present

## 2023-03-08 DIAGNOSIS — D869 Sarcoidosis, unspecified: Secondary | ICD-10-CM | POA: Diagnosis not present

## 2023-03-08 DIAGNOSIS — E785 Hyperlipidemia, unspecified: Secondary | ICD-10-CM

## 2023-03-08 DIAGNOSIS — Z6836 Body mass index (BMI) 36.0-36.9, adult: Secondary | ICD-10-CM

## 2023-03-08 DIAGNOSIS — E559 Vitamin D deficiency, unspecified: Secondary | ICD-10-CM

## 2023-03-08 DIAGNOSIS — R7303 Prediabetes: Secondary | ICD-10-CM

## 2023-03-08 DIAGNOSIS — E669 Obesity, unspecified: Secondary | ICD-10-CM

## 2023-03-08 DIAGNOSIS — Z79899 Other long term (current) drug therapy: Secondary | ICD-10-CM

## 2023-03-08 DIAGNOSIS — E7800 Pure hypercholesterolemia, unspecified: Secondary | ICD-10-CM

## 2023-03-08 NOTE — Assessment & Plan Note (Signed)

## 2023-03-08 NOTE — Assessment & Plan Note (Signed)
The 10-year ASCVD risk score (Arnett DK, et al., 2019) is: 6.1%   Values used to calculate the score:     Age: 62 years     Sex: Female     Is Non-Hispanic African American: Yes     Diabetic: No     Tobacco smoker: No     Systolic Blood Pressure: 136 mmHg     Is BP treated: Yes     HDL Cholesterol: 66 mg/dL     Total Cholesterol: 177 mg/dL

## 2023-03-08 NOTE — Progress Notes (Signed)
Therapy plan placed for Remicade [phone]) for [facility] to start benefits investigation  Diagnosis: sarcoidosis  Provider: Dr. Sheliah Hatch  Dose: 3mg /kg at Week 0, Week 2, Week 6 then every 8 weeks thereafter  Last Clinic Visit: 02/16/2023 Next Clinic Visit: 05/20/2023  Pertinent Labs: hepatitis A/B/C negative on 02/16/2022   Chesley Mires, PharmD, MPH, BCPS, CPP Clinical Pharmacist (Rheumatology and Pulmonology)  Fuller Plan, MD  Murrell Redden, RPH-CPP Please start process for infliximab infusion for her sarcoidosis- intolerance of methotrexate and lack of response to azathioprine. Thanks.

## 2023-03-08 NOTE — Progress Notes (Signed)
SUBJECTIVE:  Chief Complaint: Obesity  Interim History: Patient is planning a trip to [facility] for her birthday.  She just got back from [place] last week and the weather wasn't the best.  She celebrated Thanksgiving but doesn't feel like she over ate.  She thinks she may have drank more sugary alcoholic beverages when away.  She will be in [place] just for the weekend.  She is back on prednisone now.  This is her first follow up- her initial appointment was with Dr. Sharee Holster.  Santa is here to discuss her progress with her obesity treatment plan. She is on the Category 2 Plan and states she is following her eating plan approximately 50 % of the time. She states she is danicing and walking.  OBJECTIVE: Visit Diagnoses: Problem List Items Addressed This Visit       Other   Vitamin D deficiency   Sarcoidosis   Shortness of breath on exertion - Primary    Patient had indirect calorimetry done last appointment right before Thanksgiving.  RMR done at that time was 1901 and prior IC from 07/21/17 was 1678.  There was significant improvement in RMR.  She was placed on Category 2 but is voicing she is quite hungry.  Will increase to Category 3.      HLD (hyperlipidemia)    The 10-year ASCVD risk score (Arnett DK, et al., 2019) is: 6.1%   Values used to calculate the score:     Age: 42 years     Sex: Female     Is Non-Hispanic African American: Yes     Diabetic: No     Tobacco smoker: No     Systolic Blood Pressure: 136 mmHg     Is BP treated: Yes     HDL Cholesterol: 66 mg/dL     Total Cholesterol: 177 mg/dL       Prediabetes    Pathophysiology of progression through insulin resistance to prediabetes and diabetes was discussed at length today.  Patient to continue to monitor and be in control of total intake of snack calories which may be simple carbohydrates but should be consumed only after the patient has taken in all the nutrition for the day.  Macronutrient identification,  classification and daily intake ratios were discussed.  Plan to repeat labs in 3 months to monitor both hemoglobin A1c and insulin levels.  No medications at this time as patient is not having significant hunger or cravings that would make following meal plan more difficult.         Other Visit Diagnoses     Generalized obesity-starting bmi 02/22/23  36.78       BMI 36.0-36.9,adult           Vitals Temp: 98.3 F (36.8 C) BP: 136/84 Pulse Rate: 84 SpO2: 100 %   Anthropometric Measurements Height: 5\' 5"  (1.651 m) Weight: 220 lb (99.8 kg) BMI (Calculated): 36.61 Weight at Last Visit: 221 lb Weight Lost Since Last Visit: 1 Weight Gained Since Last Visit: 0 Starting Weight: 221 lb   Body Composition  Body Fat %: 45.1 % Fat Mass (lbs): 99.4 lbs Muscle Mass (lbs): 115 lbs Total Body Water (lbs): 80.8 lbs Visceral Fat Rating : 13   Other Clinical Data Today's Visit #: 2 Starting Date: 02/22/23     ASSESSMENT AND PLAN:  Diet: Kiriaki is currently in the action stage of change. As such, her goal is to continue with weight loss efforts. She has agreed to Category 3  Plan.  Exercise: Janylah has been instructed that some exercise is better than none for weight loss and overall health benefits.   Behavior Modification:  We discussed the following Behavioral Modification Strategies today: increasing lean protein intake, increasing vegetables, meal planning and cooking strategies, travel eating strategies, and holiday eating strategies.   No follow-ups on file.Marland Kitchen She was informed of the importance of frequent follow up visits to maximize her success with intensive lifestyle modifications for her multiple health conditions.  Attestation Statements:   Reviewed by clinician on day of visit: allergies, medications, problem list, medical history, surgical history, family history, social history, and previous encounter notes.   Reuben Likes, MD

## 2023-03-08 NOTE — Telephone Encounter (Addendum)
Patient will need TB gold drawn prior to initiating infliximab infusions. Please call patient. Future order is placed.  I placed referral to [facility] for her infusions  Hepatitis A/B/C panel negative on 02/16/2022   Chesley Mires, PharmD, MPH, BCPS, CPP Clinical Pharmacist (Rheumatology and Pulmonology)

## 2023-03-08 NOTE — Telephone Encounter (Signed)
Error

## 2023-03-08 NOTE — Assessment & Plan Note (Signed)
Patient had indirect calorimetry done last appointment right before Thanksgiving.  RMR done at that time was 1901 and prior IC from 07/21/17 was 1678.  There was significant improvement in RMR.  She was placed on Category 2 but is voicing she is quite hungry.  Will increase to Category 3.

## 2023-03-08 NOTE — Telephone Encounter (Signed)
Attempted to contact the patient and left a message advising the patient to get a TB Gold lab drawn for her infusions and advised the patient of our lab hours here at the office.

## 2023-03-08 NOTE — Telephone Encounter (Signed)
-----   Message from Jamesetta [facility] sent at 03/07/2023 11:58 AM EST ----- Regarding: Infliximab Please start process for infliximab infusion for her sarcoidosis- intolerance of methotrexate and lack of response to azathioprine. Thanks.

## 2023-03-09 ENCOUNTER — Other Ambulatory Visit: Payer: Self-pay

## 2023-03-09 MED ORDER — BREO ELLIPTA 100-25 MCG/ACT IN AEPB: 1.0000 | INHALATION_SPRAY | Freq: Every day | RESPIRATORY_TRACT | 2 refills | Status: DC

## 2023-03-17 ENCOUNTER — Telehealth: Payer: Self-pay

## 2023-03-17 ENCOUNTER — Other Ambulatory Visit: Payer: Self-pay | Admitting: Pharmacist

## 2023-03-17 NOTE — Progress Notes (Signed)
Plan discontinued as Insurance prefers [place]. Therapy plan placed for Avsola in separate orders only encounter

## 2023-03-17 NOTE — Progress Notes (Signed)
Therapy [name] placed for Avsola [phone])   Diagnosis: sarcoidosis   Provider: Dr. Sheliah Hatch   Dose: 3mg /kg at Week 0, Week 2, Week 6 then every 8 weeks thereafter   Last Clinic Visit: 02/16/2023 Next Clinic Visit: 05/20/2023   Pertinent Labs: hepatitis A/B/C negative on 02/16/2022    Ruth [name], PharmD, MPH, BCPS, CPP Clinical Pharmacist (Rheumatology and Pulmonology)   Ruth Plan, MD  Ruth [name], RPH-CPP Please start process for infliximab infusion for her sarcoidosis- intolerance of methotrexate and lack of response to azathioprine. Thanks.

## 2023-03-17 NOTE — Telephone Encounter (Signed)
Dr. Dimple Casey and Emi Belfast, please see denial.  Auth Submission: DENIED Site of care: Site of care: [facility] Payer: [employer] Medication & CPT/J Code(s) submitted: Remicade (Infliximab) J1745 Route of submission (phone, fax, portal): phone Phone # [phone] Case # [id number]  Authorization has been DENIED because Inflectra and Avsola are preferred meds.

## 2023-03-18 NOTE — Telephone Encounter (Signed)
Another MyChart message sent to patient regarding TB gold

## 2023-03-21 ENCOUNTER — Other Ambulatory Visit: Payer: Self-pay | Admitting: *Deleted

## 2023-03-21 DIAGNOSIS — D869 Sarcoidosis, unspecified: Secondary | ICD-10-CM

## 2023-03-21 DIAGNOSIS — Z79899 Other long term (current) drug therapy: Secondary | ICD-10-CM

## 2023-03-24 ENCOUNTER — Telehealth: Payer: Self-pay | Admitting: Pharmacy Technician

## 2023-03-24 LAB — QUANTIFERON-TB GOLD PLUS
Mitogen-NIL: 7.45 [IU]/mL
NIL: 0.08 [IU]/mL
QuantiFERON-TB Gold Plus: NEGATIVE
TB1-NIL: 0.02 [IU]/mL
TB2-NIL: 0.02 [IU]/mL

## 2023-03-24 NOTE — Telephone Encounter (Addendum)
 Devki, Fyi note:  Patient will be scheduled as soon as possible.  Auth Submission: APPROVED Site of care: Site of care: [facility] Payer: [employer] Medication & CPT/J Code(s) submitted: Avsola (infliximab-axxq) [phone] Route of submission (phone, fax, portal):  Phone #  Fax # Auth type: Buy/Bill PB Units/visits requested: 300MG  Arthor Captain Q8WKS Reference number: [id number] Approval from: 03/24/23 to 03/22/24  Co-pay card: Approved  Co-Pay Card Details Full Name [facility] Co-Pay Member ID [id number] Group Number [id number] BIN [id number] PCN [id number]

## 2023-03-25 ENCOUNTER — Other Ambulatory Visit: Payer: Self-pay

## 2023-03-25 ENCOUNTER — Encounter: Payer: Self-pay | Admitting: Neurology

## 2023-03-25 DIAGNOSIS — I671 Cerebral aneurysm, nonruptured: Secondary | ICD-10-CM

## 2023-03-25 NOTE — Progress Notes (Signed)
TB gold negative. Okay to move forward with Avsola infusion  Chesley Mires, PharmD, MPH, BCPS, CPP Clinical Pharmacist (Rheumatology and Pulmonology)

## 2023-03-25 NOTE — Progress Notes (Signed)
Per Dr.Jaffe, I would like to check another MRA of head to follow up on aneurysm.

## 2023-03-28 ENCOUNTER — Encounter: Payer: Self-pay | Admitting: Internal Medicine

## 2023-03-29 ENCOUNTER — Inpatient Hospital Stay
Admission: RE | Admit: 2023-03-29 | Discharge: 2023-03-29 | Discharge status: Home / Self Care | Payer: Managed Care, Other (non HMO) | Source: Ambulatory Visit | Attending: Neurology

## 2023-03-29 DIAGNOSIS — I671 Cerebral aneurysm, nonruptured: Secondary | ICD-10-CM

## 2023-04-04 ENCOUNTER — Ambulatory Visit (INDEPENDENT_AMBULATORY_CARE_PROVIDER_SITE_OTHER): Payer: Managed Care, Other (non HMO) | Admitting: Adult Health

## 2023-04-04 ENCOUNTER — Encounter (INDEPENDENT_AMBULATORY_CARE_PROVIDER_SITE_OTHER): Payer: Self-pay | Admitting: Adult Health

## 2023-04-04 VITALS — BP 116/79 | HR 72 | Temp 98.1°F | Ht 65.0 in | Wt 215.0 lb

## 2023-04-04 DIAGNOSIS — Z6835 Body mass index (BMI) 35.0-35.9, adult: Secondary | ICD-10-CM

## 2023-04-04 DIAGNOSIS — R7303 Prediabetes: Secondary | ICD-10-CM | POA: Diagnosis not present

## 2023-04-04 DIAGNOSIS — E559 Vitamin D deficiency, unspecified: Secondary | ICD-10-CM

## 2023-04-04 DIAGNOSIS — D869 Sarcoidosis, unspecified: Secondary | ICD-10-CM | POA: Diagnosis not present

## 2023-04-04 DIAGNOSIS — E669 Obesity, unspecified: Secondary | ICD-10-CM

## 2023-04-04 DIAGNOSIS — Z6836 Body mass index (BMI) 36.0-36.9, adult: Secondary | ICD-10-CM

## 2023-04-04 NOTE — Progress Notes (Signed)
 WEIGHT SUMMARY AND BIOMETRICS  Vitals Temp: 98.1 F (36.7 C) BP: 116/79 Pulse Rate: 72 SpO2: 100 %   Anthropometric Measurements Height: 5' 5 (1.651 m) Weight: 215 lb (97.5 kg) BMI (Calculated): 35.78 Weight at Last Visit: 220lb Weight Lost Since Last Visit: 5lb Weight Gained Since Last Visit: 0 Starting Weight: 221lb Total Weight Loss (lbs): 6 lb (2.722 kg) Peak Weight: 230lb   Body Composition  Body Fat %: 42.9 % Fat Mass (lbs): 92.2 lbs Muscle Mass (lbs): 116.6 lbs Total Body Water (lbs): 76.2 lbs Visceral Fat Rating : 12   Other Clinical Data Fasting: no Labs: no Today's Visit #: 3 Starting Date: 02/22/23    Chief Complaint:   OBESITY Ruth [name] is here to discuss her progress with her obesity treatment plan. She is on the the Category 2 Plan and states she is following her eating plan approximately 30 % of the time. She states she is exercising NEAT Activities.   Interim History:  Ruth [name] is RESTART to [facility] She stopped regular OVs 10/05/2018 Re-established with [facility] on 02/22/2023  Reviewed Bioimpedance results with pt: Muscle Mass: +1.6 lbs Adipose Mass: - 7.2 lbs  She and her friend Donzell travelled to [place] in early Dec. Upon their return home- Donzell was reporting respiratory difficulties and suddenly passed away 03-25-2023  She reports strong local support system- friends/family  She recently started juicing and is wondering how she can incorporate into her eating plan. Answer- use snack calories for daily juice serving  Subjective:   1. Sarcoidosis Currently on ondansetron  (ZOFRAN ) 4 MG tablet  predniSONE  (DELTASONE ) 10 MG tablet  azaTHIOprine  (IMURAN ) 50 MG tablet   She is followed by Dr. Jeannetta  She will start infusion therapy this week- 04/06/2023 She will receive half dose and if tolerating well, will receive full dose 04/20/2023  Infusions will occur Q8W She will stop Imuran  and Deltasone  once infusion therapy begins  2.  Vitamin D  deficiency PCP managed Ergocalciferol - denies N/V/Muscle Weakness  Latest Reference Range & Units 07/22/22 09:35 02/16/23 08:33  Vitamin D , 25-Hydroxy 30 - 100 ng/mL 69 53   3. Prediabetes Lab Results  Component Value Date   HGBA1C 5.8 (H) 02/22/2023   HGBA1C 5.6 03/05/2019   HGBA1C 5.7 (H) 10/05/2018    She hopes to stop daily Prednisone  this month She is not currently on any antidiabetic medicaitons  Assessment/Plan:   1. Sarcoidosis F/u with Dr. Jeannetta as directed  2. Vitamin D  deficiency (Primary) Continue weekly Ergocalciferol  per PCP  3. Prediabetes Cat 2 Meal Plan Increase regular cardiovascular exercise  4. BMI 36.0-36.9,adult, Current BMI 35.78  Ruth [name] is currently in the action stage of change. As such, her goal is to continue with weight loss efforts. She has agreed to the Category 2 Plan.   Exercise goals: All adults should avoid inactivity. Some physical activity is better than none, and adults who participate in any amount of physical activity gain some health benefits. Adults should also include muscle-strengthening activities that involve all major muscle groups on 2 or more days a week.  Behavioral modification strategies: increasing lean protein intake, decreasing simple carbohydrates, increasing vegetables, increasing water intake, decreasing eating out, no skipping meals, meal planning and cooking strategies, keeping healthy foods in the home, and planning for success.  Ruth [name] has agreed to follow-up with our clinic in 4 weeks. She was informed of the importance of frequent follow-up visits to maximize her success with intensive lifestyle modifications for her multiple health conditions.  Objective:   Blood pressure 116/79, pulse 72, temperature 98.1 F (36.7 C), height 5' 5 (1.651 m), weight 215 lb (97.5 kg), last menstrual period 09/19/2015, SpO2 100%. Body mass index is 35.78 kg/m.  General: Cooperative, alert, well developed, in no acute  distress. HEENT: Conjunctivae and lids unremarkable. Cardiovascular: Regular rhythm.  Lungs: Normal work of breathing. Neurologic: No focal deficits.   Lab Results  Component Value Date   CREATININE 0.95 02/16/2023   BUN 12 02/16/2023   NA 141 02/16/2023   K 3.9 02/16/2023   CL 105 02/16/2023   CO2 27 02/16/2023   Lab Results  Component Value Date   ALT 15 02/16/2023   AST 25 02/16/2023   ALKPHOS 221 (H) 02/16/2022   BILITOT 0.4 02/16/2023   Lab Results  Component Value Date   HGBA1C 5.8 (H) 02/22/2023   HGBA1C 5.6 03/05/2019   HGBA1C 5.7 (H) 10/05/2018   HGBA1C 5.5 04/13/2018   HGBA1C 5.7 (H) 11/02/2017   Lab Results  Component Value Date   INSULIN  8.4 02/22/2023   INSULIN  9.5 10/05/2018   INSULIN  12.8 04/13/2018   INSULIN  6.2 11/02/2017   INSULIN  15.0 07/21/2017   Lab Results  Component Value Date   TSH 1.310 02/22/2023   Lab Results  Component Value Date   CHOL 177 02/22/2023   HDL 66 02/22/2023   LDLCALC 97 02/22/2023   TRIG 73 02/22/2023   CHOLHDL 2 01/19/2022   Lab Results  Component Value Date   VD25OH 53 02/16/2023   VD25OH 69 07/22/2022   VD25OH 29.88 (L) 03/31/2021   Lab Results  Component Value Date   WBC 4.0 02/16/2023   HGB 13.2 02/16/2023   HCT 41.2 02/16/2023   MCV 85.8 02/16/2023   PLT 280 02/16/2023   No results found for: IRON, TIBC, FERRITIN  Attestation Statements:   Reviewed by clinician on day of visit: allergies, medications, problem list, medical history, surgical history, family history, social history, and previous encounter notes.  I have reviewed the above documentation for accuracy and completeness, and I agree with the above. -  Anvi Mangal d. Rosena Bartle, NP-C

## 2023-04-05 ENCOUNTER — Telehealth: Payer: Self-pay | Admitting: Internal Medicine

## 2023-04-05 NOTE — Telephone Encounter (Signed)
 I called patient, patient verbalized understanding.

## 2023-04-05 NOTE — Telephone Encounter (Signed)
 There will be a 40 mg dose of solumedrol (a steroid) administered prior to the Avsola  to minimize the risk of any possible reaction since this is a new medication for her. So she does not need to take her oral prednisone  dose that day. She should continue the Imuran  as usual.

## 2023-04-05 NOTE — Telephone Encounter (Signed)
 Patient called stating she is scheduled for her Avsola infusion tomorrow, 04/06/23 and is asking if she needs to hold her Imuran and Prednsione medications.

## 2023-04-06 ENCOUNTER — Ambulatory Visit: Payer: Managed Care, Other (non HMO)

## 2023-04-06 ENCOUNTER — Other Ambulatory Visit: Payer: Managed Care, Other (non HMO)

## 2023-04-06 VITALS — BP 119/81 | HR 89 | Temp 98.1°F | Resp 18 | Ht 65.0 in | Wt 217.6 lb

## 2023-04-06 DIAGNOSIS — D869 Sarcoidosis, unspecified: Secondary | ICD-10-CM | POA: Diagnosis not present

## 2023-04-06 LAB — COMPREHENSIVE METABOLIC PANEL
ALT: 15 U/L (ref 0–35)
AST: 20 U/L (ref 0–37)
Albumin: 4.2 g/dL (ref 3.5–5.2)
Alkaline Phosphatase: 144 U/L — ABNORMAL HIGH (ref 39–117)
BUN: 19 mg/dL (ref 6–23)
CO2: 25 meq/L (ref 19–32)
Calcium: 9.4 mg/dL (ref 8.4–10.5)
Chloride: 104 meq/L (ref 96–112)
Creatinine, Ser: 0.87 mg/dL (ref 0.40–1.20)
GFR: 72.6 mL/min (ref >60.00)
Glucose, Bld: 79 mg/dL (ref 70–99)
Potassium: 3.8 meq/L (ref 3.5–5.1)
Sodium: 138 meq/L (ref 135–145)
Total Bilirubin: 0.5 mg/dL (ref 0.2–1.2)
Total Protein: 8.2 g/dL (ref 6.0–8.3)

## 2023-04-06 LAB — CBC WITH DIFFERENTIAL/PLATELET
Basophils Absolute: 0.1 10*3/uL (ref 0.0–0.1)
Basophils Relative: 1.1 % (ref 0.0–3.0)
Eosinophils Absolute: 0.1 10*3/uL (ref 0.0–0.7)
Eosinophils Relative: 2.2 % (ref 0.0–5.0)
HCT: 41.5 % (ref 36.0–46.0)
Hemoglobin: 13.7 g/dL (ref 12.0–15.0)
Lymphocytes Relative: 15.2 % (ref 12.0–46.0)
Lymphs Abs: 0.9 10*3/uL (ref 0.7–4.0)
MCHC: 33 g/dL (ref 30.0–36.0)
MCV: 85 fL (ref 78.0–100.0)
Monocytes Absolute: 0.4 10*3/uL (ref 0.1–1.0)
Monocytes Relative: 7.3 % (ref 3.0–12.0)
Neutro Abs: 4.5 10*3/uL (ref 1.4–7.7)
Neutrophils Relative %: 74.2 % (ref 43.0–77.0)
Platelets: 295 10*3/uL (ref 150.0–400.0)
RBC: 4.88 Mil/uL (ref 3.87–5.11)
RDW: 15.2 % (ref 11.5–15.5)
WBC: 6.1 10*3/uL (ref 4.0–10.5)

## 2023-04-06 MED ORDER — SODIUM CHLORIDE 0.9 % IV SOLN
3.0000 mg/kg | Freq: Once | INTRAVENOUS | Status: AC
  Administered 2023-04-06: 300 mg via INTRAVENOUS
  Filled 2023-04-06: qty 30

## 2023-04-06 MED ORDER — METHYLPREDNISOLONE SODIUM SUCC 40 MG IJ SOLR
40.0000 mg | Freq: Once | INTRAMUSCULAR | Status: AC
  Administered 2023-04-06: 40 mg via INTRAVENOUS
  Filled 2023-04-06: qty 1

## 2023-04-06 MED ORDER — ACETAMINOPHEN 325 MG PO TABS
650.0000 mg | ORAL_TABLET | Freq: Once | ORAL | Status: AC
  Administered 2023-04-06: 650 mg via ORAL
  Filled 2023-04-06: qty 2

## 2023-04-06 MED ORDER — DIPHENHYDRAMINE HCL 25 MG PO CAPS
25.0000 mg | ORAL_CAPSULE | Freq: Once | ORAL | Status: AC
  Administered 2023-04-06: 25 mg via ORAL
  Filled 2023-04-06: qty 1

## 2023-04-06 NOTE — Progress Notes (Signed)
 Diagnosis: Sarcoidosis  Provider:  Lonna Coder MD  Procedure: IV Infusion  IV Type: Peripheral, IV Location: R Forearm  Avsola  (infliximab -axxq), Dose: 300 mg  Infusion Start Time: 1400  Infusion Stop Time: 1609  Post Infusion IV Care: Observation period completed and Peripheral IV Discontinued  Discharge: Condition: Good, Destination: Home . AVS Provided  Performed by:  Jacky Hartung, RN

## 2023-04-14 ENCOUNTER — Encounter: Payer: Self-pay | Admitting: Internal Medicine

## 2023-04-14 ENCOUNTER — Encounter: Payer: Self-pay | Admitting: Family Medicine

## 2023-04-20 ENCOUNTER — Ambulatory Visit: Payer: Managed Care, Other (non HMO)

## 2023-04-20 VITALS — BP 120/81 | HR 81 | Temp 97.7°F | Resp 18 | Ht 64.5 in | Wt 214.8 lb

## 2023-04-20 DIAGNOSIS — D869 Sarcoidosis, unspecified: Secondary | ICD-10-CM | POA: Diagnosis not present

## 2023-04-20 MED ORDER — INFLIXIMAB-AXXQ 100 MG IV SOLR
3.0000 mg/kg | Freq: Once | INTRAVENOUS | Status: AC
  Administered 2023-04-20: 300 mg via INTRAVENOUS
  Filled 2023-04-20: qty 30

## 2023-04-20 MED ORDER — DIPHENHYDRAMINE HCL 25 MG PO CAPS
25.0000 mg | ORAL_CAPSULE | Freq: Once | ORAL | Status: AC
  Administered 2023-04-20: 25 mg via ORAL
  Filled 2023-04-20: qty 1

## 2023-04-20 MED ORDER — METHYLPREDNISOLONE SODIUM SUCC 40 MG IJ SOLR
40.0000 mg | Freq: Once | INTRAMUSCULAR | Status: AC
  Administered 2023-04-20: 40 mg via INTRAVENOUS
  Filled 2023-04-20: qty 1

## 2023-04-20 MED ORDER — ACETAMINOPHEN 325 MG PO TABS
650.0000 mg | ORAL_TABLET | Freq: Once | ORAL | Status: AC
  Administered 2023-04-20: 650 mg via ORAL
  Filled 2023-04-20: qty 2

## 2023-04-20 NOTE — Progress Notes (Signed)
Diagnosis: Sarcoidosis  Provider:  Chilton Greathouse MD  Procedure: Injection  IV Type: Peripheral, IV Location: R Forearm  Avsola (infliximab-axxq), Dose: 300 mg  Infusion Start Time: 0912  Infusion Stop Time: 1132  Post Infusion IV Care: Peripheral IV Discontinued  Discharge: Condition: Good, Destination: Home . AVS Declined  Performed by:  Garnette Czech, RN

## 2023-05-02 ENCOUNTER — Ambulatory Visit (INDEPENDENT_AMBULATORY_CARE_PROVIDER_SITE_OTHER): Payer: Managed Care, Other (non HMO) | Admitting: Adult Health

## 2023-05-02 ENCOUNTER — Encounter (INDEPENDENT_AMBULATORY_CARE_PROVIDER_SITE_OTHER): Payer: Self-pay | Admitting: Adult Health

## 2023-05-02 VITALS — BP 117/80 | HR 76 | Temp 97.5°F | Ht 65.0 in | Wt 207.0 lb

## 2023-05-02 DIAGNOSIS — R7303 Prediabetes: Secondary | ICD-10-CM | POA: Diagnosis not present

## 2023-05-02 DIAGNOSIS — E669 Obesity, unspecified: Secondary | ICD-10-CM | POA: Diagnosis not present

## 2023-05-02 DIAGNOSIS — Z6834 Body mass index (BMI) 34.0-34.9, adult: Secondary | ICD-10-CM | POA: Diagnosis not present

## 2023-05-02 DIAGNOSIS — E559 Vitamin D deficiency, unspecified: Secondary | ICD-10-CM

## 2023-05-02 DIAGNOSIS — Z6836 Body mass index (BMI) 36.0-36.9, adult: Secondary | ICD-10-CM

## 2023-05-02 NOTE — Progress Notes (Signed)
WEIGHT SUMMARY AND BIOMETRICS  Vitals Temp: (!) 97.5 F (36.4 C) BP: 117/80 Pulse Rate: 76 SpO2: 98 %   Anthropometric Measurements Height: 5\' 5"  (1.651 m) Weight: 207 lb (93.9 kg) BMI (Calculated): 34.45 Weight at Last Visit: 215lb Weight Lost Since Last Visit: 8lb Weight Gained Since Last Visit: 0 Starting Weight: 221lb Total Weight Loss (lbs): 14 lb (6.35 kg) Peak Weight: 230lb   Body Composition  Body Fat %: 43.9 % Fat Mass (lbs): 91.2 lbs Muscle Mass (lbs): 110.6 lbs Total Body Water (lbs): 75.6 lbs Visceral Fat Rating : 12   Other Clinical Data Fasting: yes Labs: no Today's Visit #: 4 Starting Date: 02/22/23    Chief Complaint:   OBESITY Ruth [name] is here to discuss her progress with her obesity treatment plan. She is on the the Category 2 Plan and states she is following her eating plan approximately 50 % of the time.  She states she is exercising minimal walking 30 minutes 3 times per week.   Interim History:  Over the month Jan 2025- She dramatically reduced eating out- only had two meals out of her home in the last 4 weeks. She has drastically reduced process food intake.  She provided the following food recall typical of a day: Breakfast: Eggs- either boiled of fried. Lunch: Either roasted [place] or tuna pouch Dinner: often skips  She feels that dinner is the meal that is the most challenging to eat on plan. She feels that her dinner options are too restrictive.  Subjective:   1. Prediabetes Lab Results  Component Value Date   HGBA1C 5.8 (H) 02/22/2023   HGBA1C 5.6 03/05/2019   HGBA1C 5.7 (H) 10/05/2018     Latest Reference Range & Units 02/22/23 09:44  INSULIN 2.6 - 24.9 uIU/mL 8.4   She endorses stable appetite  She is not currently on any antidiabetic therapy  2. Vitamin D deficiency  Latest Reference Range & Units 07/22/22 09:35 08/31/22 13:26 02/16/23 08:33  Vitamin D 1, 25 (OH) Total 18 - 72 pg/mL 110 (H) 96 (H) 115 (H)   (H): Data is abnormally high  She endorses stable energy levels. She is on weekly Ergocalciferol- denies N/V/Muscle Weakness  Assessment/Plan:   1. Prediabetes Continue to increase daily protein and limit simple CHO/sugar  2. Vitamin D deficiency (Primary) Continue weekly Ergocalciferol- does not require refill today  3. BMI 36.0-36.9,adult, Current BMI 34.45 Ruth [name]: 400-500 cal/ 35g+ protein/day  Ruth [name] is currently in the action stage of change. As such, her goal is to continue with weight loss efforts. She has agreed to the Category 2 Plan.   Exercise goals: All adults should avoid inactivity. Some physical activity is better than none, and adults who participate in any amount of physical activity gain some health benefits. Adults should also include muscle-strengthening activities that involve all major muscle groups on 2 or more days a week.  Behavioral modification strategies: increasing lean protein intake, decreasing simple carbohydrates, increasing vegetables, increasing water intake, decreasing liquid calories, no skipping meals, meal planning and cooking strategies, keeping healthy foods in the home, ways to avoid boredom eating, and planning for success.  Ruth [name] has agreed to follow-up with our clinic in 4 weeks. She was informed of the importance of frequent follow-up visits to maximize her success with intensive lifestyle modifications for her multiple health conditions.   Objective:   Blood pressure 117/80, pulse 76, temperature (!) 97.5 F (36.4 C), height 5\' 5"  (1.651 m), weight 207 lb (93.9  kg), last menstrual period 09/19/2015, SpO2 98%. Body mass index is 34.45 kg/m.  General: Cooperative, alert, well developed, in no acute distress. HEENT: Conjunctivae and lids unremarkable. Cardiovascular: Regular rhythm.  Lungs: Normal work of breathing. Neurologic: No focal deficits.   Lab Results  Component Value Date   CREATININE 0.87 04/06/2023   BUN 19 04/06/2023    NA 138 04/06/2023   K 3.8 04/06/2023   CL 104 04/06/2023   CO2 25 04/06/2023   Lab Results  Component Value Date   ALT 15 04/06/2023   AST 20 04/06/2023   ALKPHOS 144 (H) 04/06/2023   BILITOT 0.5 04/06/2023   Lab Results  Component Value Date   HGBA1C 5.8 (H) 02/22/2023   HGBA1C 5.6 03/05/2019   HGBA1C 5.7 (H) 10/05/2018   HGBA1C 5.5 04/13/2018   HGBA1C 5.7 (H) 11/02/2017   Lab Results  Component Value Date   INSULIN 8.4 02/22/2023   INSULIN 9.5 10/05/2018   INSULIN 12.8 04/13/2018   INSULIN 6.2 11/02/2017   INSULIN 15.0 07/21/2017   Lab Results  Component Value Date   TSH 1.310 02/22/2023   Lab Results  Component Value Date   CHOL 177 02/22/2023   HDL 66 02/22/2023   LDLCALC 97 02/22/2023   TRIG 73 02/22/2023   CHOLHDL 2 01/19/2022   Lab Results  Component Value Date   VD25OH 53 02/16/2023   VD25OH 69 07/22/2022   VD25OH 29.88 (L) 03/31/2021   Lab Results  Component Value Date   WBC 6.1 04/06/2023   HGB 13.7 04/06/2023   HCT 41.5 04/06/2023   MCV 85.0 04/06/2023   PLT 295.0 04/06/2023   No results found for: "IRON", "TIBC", "FERRITIN"  Attestation Statements:   Reviewed by clinician on day of visit: allergies, medications, problem list, medical history, surgical history, family history, social history, and previous encounter notes.  Time spent on visit including pre-visit chart review and post-visit care and charting was 18 minutes.   I have reviewed the above documentation for accuracy and completeness, and I agree with the above. -  Tayjah Lobdell d. Semira Stoltzfus, NP-C

## 2023-05-06 NOTE — Progress Notes (Signed)
 Office Visit Note  Patient: Ruth [name]             Date of Birth: 1960/11/04           MRN: [id number]             PCP: Donato Schultz, DO Referring: Donato Schultz, * Visit Date: 05/20/2023   Subjective:  Follow-up   Discussed the use of AI scribe software for clinical note transcription with the patient, who gave verbal consent to proceed.  History of Present Illness   Ruth [name] is a 63 y.o. female here for follow up for sarcoidosis with bulky cervical lymphadenopathy now on Avsola infusions 3 mg/kg started January 9th and azathioprine 100 mg daily.    She is currently undergoing treatment for sarcoidosis with Evzola infusions, which began in January. She is in the loading stage, receiving infusions every four to eight weeks. Her ACE levels, previously elevated due to sarcoidosis, decreased slightly with steroid use but increased again when steroids were reduced. She takes Imuran to prevent antibody formation against the infusion medication, experiencing constipation as a side effect. No significant side effects from the infusions are noted, except for initial sluggishness that resolves by the next day.  She experiences joint pain, particularly in her hands, which is less severe than during her last visit. Morning stiffness improves throughout the day. No radiating pain or pain in her feet and ankles is reported.  No new rashes or swelling, except for a recurring rash on her buttocks, which she associates with sarcoidosis.  No recent illnesses except for sinus issues and post-nasal drip. Overall, she feels better with less joint pain and increased energy levels. She also made a recent switch from a prescription vitamin D to over-the-counter vitamin D3.     Previous HPI 02/16/2023 Ruth [name] is a 63 y.o. female here for follow up for sarcoidosis with bulky cervical lymphadenopathy now on azathioprine 100 mg daily.  She tapered off of prednisone  completely since our last visit.  Since stopping the medication noticed increased in joint stiffness and achiness in multiple areas.  Not with any visible swelling.  Seems worse first thing in the morning and getting up after she sits for more than a few minutes at a time.  She is noticing nausea and decreased appetite from the azathioprine also noticed a darker color to her urine.  No new skin rashes.  Still has ongoing lymph node swelling but decreased size from before.   Previous HPI 12/16/2022 Ruth [name] is a 63 y.o. female here for follow up for sarcoidosis with bulky cervical lymphadenopathy on prednisone 10 mg daily.  She developed severe side effects from methotrexate after last visit and stopped taking the medication.  She was experiencing dizziness and severe fatigue lasting for 3 to 4 days at a time after each dose.  When she stopped the methotrexate after a week she felt back to her normal state.  We discussed starting azathioprine as an alternate steroid sparing DMARD but she did not begin this with concerns about the medication after her bad experience of methotrexate.  Has been taking the prednisone 10 mg daily and feels that the large swollen nodules in her neck have continued to improve.  No new chest pain shortness of breath or coughing.  She does have a large amount of unintentional weight gain from the prolonged steroids estimates about 37 pounds.   Previous HPI 10/07/2022 Ruth [name]  is a 63 y.o. female here for follow up for sarcoidosis with bulky cervical lymphadenopathy now on methotrexate 15 mg p.o. weekly folic acid 1 mg daily and tapered down off prednisone after last visit.  She is only started the methotrexate in the past 2 weeks was initially delaying this as she was at the hospital supporting her daughter who was admitted with major pregnancy complications.  X-ray obtained did not suggest any mediastinal lymphadenopathy.  Symptoms overall pretty stable still with  bulky swallowing throughout the neck but not particularly painful.   Previous HPI 08/31/22 Ruth [name] is a 63 y.o. female here for follow up for sarcoidosis with fatigue and bulky lymphadenopathy now on prednisone 20 mg daily.  She has seen improvement of the large lymph node swelling.  Has noticed several side effects with unintentional weight gain, blurry vision, and cramping in her calfs and toes.  She has been trying to moderate her diet but definitely still increased appetite and becoming very tired shortly after eating.  Joint pain and stiffness including low back pain is doing great on the steroids.   Previous HPI 07/22/22 Ruth [name] is a 63 y.o. female here for evaluation of possible sarcoidosis with cervical lymphadenopathy evaluated with excisional biopsy.  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  Lymph node swelling started in July of last year she did not recall any specific preceding major illness or medical event.  Her initial visit with PCP labs did have positive IgM and IgG for EBV but without known sick contact for infectious mononucleosis.  She was initially treated with Keflex and saw temporary improvement in symptoms but swelling has returned and been present continuously for at least the past 8 months.  Has been associated with some fatigue and had some tooth pain otherwise does not recall major change in symptoms otherwise.  Due to persistent lymphadenopathy she had CT imaging of the neck concerning for lymphoma and saw Dr. Myna Hidalgo with oncology.  Lab testing at the time showed a low positive ANA and she has a persistent elevated alkaline phosphatase attributed to gallbladder disease with multiple stones but minimal symptoms.  Subsequently went for cervical lymph node biopsy with ENT Dr. Jenne Pane this was negative for malignancy and suspicious for  sarcoidosis or other reactive or autoimmune inflammatory process. She has chronic persistent moderate asthma on inhaler treatments and intermittently requires systemic steroids last treatment in March due to exacerbation with 1 week of prednisone.  But has not noticed cough dyspnea chest pain or other respiratory complaints outside of the usual for her asthma and allergies. She has a long history of eczema this was severe as a child with mild persistent symptoms still ongoing.  There are a few areas of permanent residual hypopigmentation.  She had a few skin colored raised nodules along the corners of the mouth and on her back evaluated by dermatology last year with biopsy showing some interstitial granulomatous process. She has chronic joint pain and stiffness most often her legs with trouble from hip and knee pain that is worse trying to get up from the floor.  Occasionally gets pain in her hands and feet but without visible swelling.  She does not take any daily medication for joint pains. She denies any new visual changes eye pain or inflammation.  Does have chronically dry eyes but thinks this may be related to her allergies or antihistamines.  No new  hair loss, no photosensitivity, no Raynaud's symptoms.   02/05/22 CT Neck IMPRESSION: Widespread lymphadenopathy throughout all cervical nodal stations, symmetric from right to left. This includes enlargement of intraparotid lymph nodes. No single dominant node or necrotic node. Bilateral axillary lymphadenopathy also present. 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 Possible reactive nodal enlargement to some sort of systemic infection or autoimmune disease is also possible.   Review of Systems  Constitutional:  Negative for fatigue.  HENT:  Negative for mouth sores and mouth dryness.   Eyes:  Positive for dryness.  Respiratory:  Negative for shortness of breath.   Cardiovascular:  Negative for chest pain and palpitations.  Gastrointestinal:   Positive for constipation. Negative for blood in stool and diarrhea.  Endocrine: Negative for increased urination.  Genitourinary:  Negative for involuntary urination.  Musculoskeletal:  Positive for joint pain, gait problem, joint pain, myalgias, morning stiffness and myalgias. Negative for joint swelling, muscle weakness and muscle tenderness.  Skin:  Negative for color change, rash, hair loss and sensitivity to sunlight.  Allergic/Immunologic: Negative for susceptible to infections.  Neurological:  Negative for dizziness and headaches.  Hematological:  Negative for swollen glands.  Psychiatric/Behavioral:  Negative for depressed mood and sleep disturbance. The patient is nervous/anxious.     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   Preventative health care 07/07/2020   SAH (subarachnoid hemorrhage) (HCC) 07/21/2019   RUQ pain 06/18/2019   Dyspepsia 06/18/2019   Moderate persistent asthma with acute exacerbation 03/05/2019   Vitamin D deficiency 07/03/2018   Depression 07/03/2018   Class 2 severe obesity with serious comorbidity and body mass index (BMI) of 37.0 to 37.9 in adult (HCC) 07/03/2018   Essential hypertension 09/19/2017   Aneurysm, cerebral, nonruptured 05/06/2015   White matter abnormality on MRI of brain 05/06/2015   Migraine headache without aura 05/06/2015   Dizziness 05/06/2015   Foreign body of ear, left 09/05/2012   HTN (hypertension) 06/12/2012   Allergic rhinitis due to pollen 12/27/2007   Allergic-infective  asthma 08/29/2006   ECZEMA 08/29/2006    Past Medical History:  Diagnosis Date   Allergic rhinitis, cause unspecified    Asthma    Constipation    Eczema    Food allergy    Gallbladder problem    Headache    Hypertension    Joint pain    Vitamin D deficiency     Family History  Problem Relation Age of Onset   Asthma Mother    Allergies Mother    Asthma Father    Drug abuse Father    Allergies Father    Past Surgical History:  Procedure Laterality Date   BRAIN SURGERY     CESAREAN SECTION Bilateral [id number]   Social History   Social History Narrative   Exercise--no   Right handed   Caffiene1 cup   Lives alone and a two story home   Immunization History  Administered Date(s) Administered   PFIZER(Purple Top)SARS-COV-2 Vaccination 10/19/2019, 11/09/2019   Td 05/27/2012     Objective: Vital Signs: BP 124/81 (BP Location: Left  Arm, Patient Position: Sitting, Cuff Size: Large)   Pulse 86   Resp 14   Ht 5' 4.5" (1.638 m)   Wt 212 lb (96.2 kg)   LMP 09/19/2015   BMI 35.83 kg/m    Physical Exam Constitutional:      Appearance: She is obese.  Eyes:     Conjunctiva/sclera: Conjunctivae normal.  Cardiovascular:     Rate and Rhythm: Normal rate and regular rhythm.  Pulmonary:     Effort: Pulmonary effort is normal.     Breath sounds: Normal breath sounds.  Musculoskeletal:     Right lower leg: No edema.     Left lower leg: No edema.  Lymphadenopathy:     Cervical: No cervical adenopathy.  Skin:    General: Skin is warm and dry.  Neurological:     Mental Status: She is alert.  Psychiatric:        Mood and Affect: Mood normal.      Musculoskeletal Exam:  Shoulders full ROM no tenderness or swelling Elbows full ROM no tenderness or swelling Wrists full ROM no tenderness or swelling Fingers full ROM no tenderness or swelling Midline low back tenderness to pressure Hip normal internal and external rotation without pain, no tenderness to lateral hip  palpation Knees full ROM no tenderness or swelling Ankles full ROM no tenderness or swelling   Investigation: No additional findings.  Imaging: No results found.  Recent Labs: Lab Results  Component Value Date   WBC 6.1 04/06/2023   HGB 13.7 04/06/2023   PLT 295.0 04/06/2023   NA 138 04/06/2023   K 3.8 04/06/2023   CL 104 04/06/2023   CO2 25 04/06/2023   GLUCOSE 79 04/06/2023   BUN 19 04/06/2023   CREATININE 0.87 04/06/2023   BILITOT 0.5 04/06/2023   ALKPHOS 144 (H) 04/06/2023   AST 20 04/06/2023   ALT 15 04/06/2023   PROT 8.2 04/06/2023   ALBUMIN 4.2 04/06/2023   CALCIUM 9.4 04/06/2023   GFRAA >60 07/21/2019   QFTBGOLDPLUS NEGATIVE 03/21/2023    Speciality Comments: No specialty comments available.  Procedures:  No procedures performed Allergies: Other and Chlorhexidine gluconate   Assessment / Plan:     Visit Diagnoses: Sarcoidosis - Plan: azaTHIOprine (IMURAN) 50 MG tablet, Angiotensin converting enzyme Symptoms of rashes, joint pain, bulky cervical adenopathy.  Improvement in joint pain and energy levels since starting Avsola infusions. ACE levels previously elevated. -Continue Avsola infusions currently in loading dose series at 3 mg/kg, with potential for dose adjustment based on response. -Reduce Imuran to 50 mg daily for constipation side effect -Check ACE levels today to monitor disease activity.  High risk medication use - Azathioprine 100 mg daily. - Plan: CBC with Differential/Platelet, COMPLETE METABOLIC PANEL WITH GFR Tolerating medications okay besides some constipation with Imuran.  No serious interval infections.  No infusion reactions reported. -Checking CBC and CMP for medication monitoring on continued Imuran and on Avsola infusion  Weight Management Patient has been successful in weight management efforts.  Will try and avoid repeating additional systemic steroids if possible.   Orders: Orders Placed This Encounter  Procedures    Angiotensin converting enzyme   CBC with Differential/Platelet   COMPLETE METABOLIC PANEL WITH GFR   Meds ordered this encounter  Medications   azaTHIOprine (IMURAN) 50 MG tablet    Sig: Take 1 tablet (50 mg total) by mouth daily.    Dispense:  90 tablet    Refill:  0     Follow-Up Instructions:  Return in about 3 months (around 08/17/2023) for Sarcoidosis on INF/AZA f/u 3mos.   Fuller Plan, MD  Note - This record has been created using [employer].  Chart creation errors have been sought, but may not always  have been located. Such creation errors do not reflect on  the standard of medical care.

## 2023-05-16 ENCOUNTER — Encounter (INDEPENDENT_AMBULATORY_CARE_PROVIDER_SITE_OTHER): Payer: Managed Care, Other (non HMO) | Admitting: Psychology

## 2023-05-16 ENCOUNTER — Telehealth (INDEPENDENT_AMBULATORY_CARE_PROVIDER_SITE_OTHER): Payer: Self-pay | Admitting: Psychology

## 2023-05-16 NOTE — Progress Notes (Signed)
 Entered in error

## 2023-05-16 NOTE — Telephone Encounter (Signed)
  Office: [phone]  /  Fax: [phone]  Date of Encounter: May 16, 2023  Time of Encounter: 9:01am Duration of Encounter: ~2.5 minute(s) Provider: Lawerance Cruel, PsyD  CONTENT: Ruth [name] presented for today's appointment via MyChart Video Visit and her identity was verified. She explained she was at work and trying to "multi-task." Ruth [name] was receptive to rescheduling. No evidence or endorsement of safety concerns. All questions/concerns addressed.   PLAN: Ruth [name] is scheduled for an appointment on June 06, 2023 at [facility] via MyChart Video Visit.

## 2023-05-20 ENCOUNTER — Ambulatory Visit: Payer: Managed Care, Other (non HMO) | Attending: Internal Medicine | Admitting: Internal Medicine

## 2023-05-20 ENCOUNTER — Encounter: Payer: Self-pay | Admitting: Internal Medicine

## 2023-05-20 VITALS — BP 124/81 | HR 86 | Resp 14 | Ht 64.5 in | Wt 212.0 lb

## 2023-05-20 DIAGNOSIS — E559 Vitamin D deficiency, unspecified: Secondary | ICD-10-CM

## 2023-05-20 DIAGNOSIS — Z79899 Other long term (current) drug therapy: Secondary | ICD-10-CM | POA: Diagnosis not present

## 2023-05-20 DIAGNOSIS — M2550 Pain in unspecified joint: Secondary | ICD-10-CM

## 2023-05-20 DIAGNOSIS — Z7952 Long term (current) use of systemic steroids: Secondary | ICD-10-CM | POA: Diagnosis not present

## 2023-05-20 DIAGNOSIS — D869 Sarcoidosis, unspecified: Secondary | ICD-10-CM

## 2023-05-20 MED ORDER — AZATHIOPRINE 50 MG PO TABS: 50.0000 mg | ORAL_TABLET | Freq: Every day | ORAL | 0 refills | Status: DC

## 2023-05-22 LAB — COMPLETE METABOLIC PANEL WITH GFR
AG Ratio: 1.2 (calc) (ref 1.0–2.5)
ALT: 10 U/L (ref 6–29)
AST: 15 U/L (ref 10–35)
Albumin: 4.2 g/dL (ref 3.6–5.1)
Alkaline phosphatase (APISO): 135 U/L (ref 37–153)
BUN: 15 mg/dL (ref 7–25)
CO2: 26 mmol/L (ref 20–32)
Calcium: 9.5 mg/dL (ref 8.6–10.4)
Chloride: 107 mmol/L (ref 98–110)
Creat: 0.96 mg/dL (ref 0.50–1.05)
Globulin: 3.4 g/dL (ref 1.9–3.7)
Glucose, Bld: 88 mg/dL (ref 65–99)
Potassium: 3.9 mmol/L (ref 3.5–5.3)
Sodium: 142 mmol/L (ref 135–146)
Total Bilirubin: 0.4 mg/dL (ref 0.2–1.2)
Total Protein: 7.6 g/dL (ref 6.1–8.1)
eGFR: 68 mL/min/{1.73_m2} (ref >=60)

## 2023-05-22 LAB — CBC WITH DIFFERENTIAL/PLATELET
Absolute Lymphocytes: 857 {cells}/uL (ref 850–3900)
Absolute Monocytes: 374 {cells}/uL (ref 200–950)
Basophils Absolute: 50 {cells}/uL (ref 0–200)
Basophils Relative: 1.4 %
Eosinophils Absolute: 162 {cells}/uL (ref 15–500)
Eosinophils Relative: 4.5 %
HCT: 42 % (ref 35.0–45.0)
Hemoglobin: 13.6 g/dL (ref 11.7–15.5)
MCH: 28 pg (ref 27.0–33.0)
MCHC: 32.4 g/dL (ref 32.0–36.0)
MCV: 86.4 fL (ref 80.0–100.0)
MPV: 11 fL (ref 7.5–12.5)
Monocytes Relative: 10.4 %
Neutro Abs: 2156 {cells}/uL (ref 1500–7800)
Neutrophils Relative %: 59.9 %
Platelets: 237 10*3/uL (ref 140–400)
RBC: 4.86 10*6/uL (ref 3.80–5.10)
RDW: 14.5 % (ref 11.0–15.0)
Total Lymphocyte: 23.8 %
WBC: 3.6 10*3/uL — ABNORMAL LOW (ref 3.8–10.8)

## 2023-05-22 LAB — ANGIOTENSIN CONVERTING ENZYME: Angiotensin-Converting Enzyme: 80 U/L — ABNORMAL HIGH (ref 9–67)

## 2023-05-23 ENCOUNTER — Ambulatory Visit (INDEPENDENT_AMBULATORY_CARE_PROVIDER_SITE_OTHER): Payer: Managed Care, Other (non HMO)

## 2023-05-23 VITALS — BP 105/65 | HR 75 | Temp 97.9°F | Resp 16 | Ht 64.5 in | Wt 209.4 lb

## 2023-05-23 DIAGNOSIS — D869 Sarcoidosis, unspecified: Secondary | ICD-10-CM

## 2023-05-23 MED ORDER — METHYLPREDNISOLONE SODIUM SUCC 40 MG IJ SOLR
40.0000 mg | Freq: Once | INTRAMUSCULAR | Status: AC
  Administered 2023-05-23: 40 mg via INTRAVENOUS
  Filled 2023-05-23: qty 1

## 2023-05-23 MED ORDER — DIPHENHYDRAMINE HCL 25 MG PO CAPS
25.0000 mg | ORAL_CAPSULE | Freq: Once | ORAL | Status: AC
  Administered 2023-05-23: 25 mg via ORAL
  Filled 2023-05-23: qty 1

## 2023-05-23 MED ORDER — ACETAMINOPHEN 325 MG PO TABS
650.0000 mg | ORAL_TABLET | Freq: Once | ORAL | Status: AC
  Administered 2023-05-23: 650 mg via ORAL
  Filled 2023-05-23: qty 2

## 2023-05-23 MED ORDER — SODIUM CHLORIDE 0.9 % IV SOLN
3.0000 mg/kg | Freq: Once | INTRAVENOUS | Status: AC
  Administered 2023-05-23: 300 mg via INTRAVENOUS
  Filled 2023-05-23: qty 30

## 2023-05-23 NOTE — Progress Notes (Signed)
 Diagnosis: Sarcoidosis  Provider:  Chilton Greathouse MD  Procedure: IV Infusion  IV Type: Peripheral, IV Location: L Forearm  Avsola (infliximab-axxq), Dose: 300 mg  Infusion Start Time: 0900  Infusion Stop Time: 1125  Post Infusion IV Care: Peripheral IV Discontinued  Discharge: Condition: Good, Destination: Home . AVS Declined  Performed by:  Garnette Czech, RN

## 2023-05-31 ENCOUNTER — Ambulatory Visit (INDEPENDENT_AMBULATORY_CARE_PROVIDER_SITE_OTHER): Payer: Managed Care, Other (non HMO) | Admitting: Family Medicine

## 2023-05-31 ENCOUNTER — Encounter (INDEPENDENT_AMBULATORY_CARE_PROVIDER_SITE_OTHER): Payer: Self-pay | Admitting: Family Medicine

## 2023-05-31 VITALS — BP 124/74 | HR 102 | Temp 98.2°F | Ht 65.0 in | Wt 203.0 lb

## 2023-05-31 DIAGNOSIS — I10 Essential (primary) hypertension: Secondary | ICD-10-CM

## 2023-05-31 DIAGNOSIS — E669 Obesity, unspecified: Secondary | ICD-10-CM | POA: Diagnosis not present

## 2023-05-31 DIAGNOSIS — K219 Gastro-esophageal reflux disease without esophagitis: Secondary | ICD-10-CM

## 2023-05-31 DIAGNOSIS — Z6833 Body mass index (BMI) 33.0-33.9, adult: Secondary | ICD-10-CM

## 2023-05-31 NOTE — Progress Notes (Signed)
 Office: [phone]  /  Fax: [phone]  WEIGHT SUMMARY AND BIOMETRICS  Anthropometric Measurements Height: 5\' 5"  (1.651 m) Weight: 203 lb (92.1 kg) BMI (Calculated): 33.78 Weight at Last Visit: 207 lb Weight Lost Since Last Visit: 4 lb Weight Gained Since Last Visit: 0 lb Starting Weight: 221 lb Total Weight Loss (lbs): 18 lb (8.165 kg) Peak Weight: 230 lb   Body Composition  Body Fat %: 42.5 % Fat Mass (lbs): 86.2 lbs Muscle Mass (lbs): 110.8 lbs Total Body Water (lbs): 77.4 lbs Visceral Fat Rating : 12   Other Clinical Data Fasting: no Labs: no Today's Visit #: 5 Starting Date: 02/22/23    Chief Complaint: OBESITY   History of Present Illness   Ruth [name] is a 63 year old female with obesity and hypertension who presents for obesity treatment and progress monitoring.  Over the past three weeks, she has lost four pounds. She follows her category two eating plan about thirty percent of the time and occasionally engages in dancing for sixty minutes as exercise. The past month has been emotionally challenging due to work-related stress, impacting her eating habits, though she has maintained some dietary control. Protein intake remains a challenge, but she is making efforts to improve it. Her hunger is reasonably well controlled, an improvement from her last visit when she felt hungry all the time.  She has a history of hypertension, currently controlled with a blood pressure reading of 124/74 mmHg. She is on a low dose of amlodipine for hypertension management. She is working on diet, exercise, and weight loss with the goal of potentially reducing or discontinuing her medication.  She has a history of acid reflux for which she was prescribed pantoprazole. However, she has not taken this medication since starting her weight loss program and reports that her symptoms have improved, particularly at night, likely due to dietary changes and weight loss.           PHYSICAL EXAM:  Blood pressure 124/74, pulse (!) 102, temperature 98.2 F (36.8 C), height 5\' 5"  (1.651 m), weight 203 lb (92.1 kg), last menstrual period 09/19/2015, SpO2 98%. Body mass index is 33.78 kg/m.  DIAGNOSTIC DATA REVIEWED:  BMET    Component Value Date/Time   NA 142 05/20/2023 0818   NA 144 10/05/2018 1004   K 3.9 05/20/2023 0818   CL 107 05/20/2023 0818   CO2 26 05/20/2023 0818   GLUCOSE 88 05/20/2023 0818   BUN 15 05/20/2023 0818   BUN 16 10/05/2018 1004   CREATININE 0.96 05/20/2023 0818   CALCIUM 9.5 05/20/2023 0818   GFRNONAA >60 02/16/2022 1350   GFRAA >60 07/21/2019 0937   Lab Results  Component Value Date   HGBA1C 5.8 (H) 02/22/2023   HGBA1C 5.7 (H) 07/21/2017   Lab Results  Component Value Date   INSULIN 8.4 02/22/2023   INSULIN 15.0 07/21/2017   Lab Results  Component Value Date   TSH 1.310 02/22/2023   CBC    Component Value Date/Time   WBC 3.6 (L) 05/20/2023 0818   RBC 4.86 05/20/2023 0818   HGB 13.6 05/20/2023 0818   HGB 13.1 02/16/2022 1350   HGB 14.2 04/13/2018 1001   HCT 42.0 05/20/2023 0818   HCT 43.1 04/13/2018 1001   PLT 237 05/20/2023 0818   PLT 240 02/16/2022 1350   PLT 235 04/13/2018 1001   MCV 86.4 05/20/2023 0818   MCV 85 04/13/2018 1001   MCH 28.0 05/20/2023 0818   MCHC 32.4  05/20/2023 0818   RDW 14.5 05/20/2023 0818   RDW 13.6 04/13/2018 1001   Iron Studies No results found for: "IRON", "TIBC", "FERRITIN", "IRONPCTSAT" Lipid Panel     Component Value Date/Time   CHOL 177 02/22/2023 0944   TRIG 73 02/22/2023 0944   HDL 66 02/22/2023 0944   CHOLHDL 2 01/19/2022 1402   VLDL 12.0 01/19/2022 1402   LDLCALC 97 02/22/2023 0944   LDLCALC 123 (H) 01/04/2020 1019   Hepatic Function Panel     Component Value Date/Time   PROT 7.6 05/20/2023 0818   PROT 7.2 10/05/2018 1004   ALBUMIN 4.2 04/06/2023 1332   ALBUMIN 4.3 10/05/2018 1004   AST 15 05/20/2023 0818   AST 36 02/16/2022 1350   ALT 10 05/20/2023 0818    ALT 28 02/16/2022 1350   ALKPHOS 144 (H) 04/06/2023 1332   BILITOT 0.4 05/20/2023 0818   BILITOT 0.8 02/16/2022 1350   BILIDIR 0.1 02/23/2021 0927   IBILI 0.3 08/24/2013 1633      Component Value Date/Time   TSH 1.310 02/22/2023 0944   Nutritional Lab Results  Component Value Date   VD25OH 53 02/16/2023   VD25OH 69 07/22/2022   VD25OH 29.88 (L) 03/31/2021     Assessment and Plan    Obesity Follow-up on obesity treatment. Lost four pounds in the last three weeks. Adheres to category two eating plan 30% of the time. Exercises by dancing for 60 minutes occasionally. Hunger is well controlled, but protein intake remains a challenge. Emphasized maintaining a dinner calorie range of 400-550 and a minimum of 35 grams of protein to prevent metabolic slowdown. Provided recipe ideas to prevent dietary monotony. - Continue category two eating plan for breakfast, lunch, and snacks. - Set dinner calorie goal between 400-550 calories. - Set dinner protein goal at a minimum of 35 grams. - Provide recipe ideas and handouts for meal planning. - Encourage feedback on recipes.  Hypertension Hypertension well controlled with a blood pressure reading of 124/74 mmHg. On a low dose of amlodipine. Weight loss and dietary changes have contributed to improved blood pressure control. Discussed potential to lower dose or discontinue amlodipine if weight loss continues and blood pressure remains controlled. - Continue current dose of amlodipine. - Monitor blood pressure regularly. - Reassess the need for amlodipine if weight loss continues and blood pressure remains controlled.  Gastroesophageal Reflux Disease (GERD) GERD symptoms have improved with weight loss and dietary changes. Has not taken pantoprazole since starting the weight loss program but keeps it on hand for as-needed use. - Use pantoprazole as needed.  General Health Maintenance Discussed the importance of regular physical activity and  healthy eating habits to improve overall health. - Encourage regular physical activity. - Provide anticipatory guidance on healthy eating habits.  Follow-up - Schedule next follow-up appointment in four weeks.        She was informed of the importance of frequent follow up visits to maximize her success with intensive lifestyle modifications for her multiple health conditions.    Quillian Quince, MD

## 2023-06-06 ENCOUNTER — Telehealth (INDEPENDENT_AMBULATORY_CARE_PROVIDER_SITE_OTHER): Payer: Managed Care, Other (non HMO) | Admitting: Psychology

## 2023-06-21 ENCOUNTER — Telehealth (INDEPENDENT_AMBULATORY_CARE_PROVIDER_SITE_OTHER): Admitting: Psychology

## 2023-06-21 DIAGNOSIS — F5089 Other specified eating disorder: Secondary | ICD-10-CM | POA: Diagnosis not present
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
## 2023-06-24 ENCOUNTER — Ambulatory Visit: Admitting: Nurse Practitioner

## 2023-06-24 ENCOUNTER — Ambulatory Visit: Payer: Self-pay

## 2023-06-24 VITALS — BP 116/88 | HR 81 | Temp 98.1°F | Ht 65.0 in | Wt 206.2 lb

## 2023-06-24 DIAGNOSIS — J301 Allergic rhinitis due to pollen: Secondary | ICD-10-CM | POA: Diagnosis not present

## 2023-06-24 MED ORDER — PREDNISONE 20 MG PO TABS: 40.0000 mg | ORAL_TABLET | Freq: Every day | ORAL | 0 refills | Status: DC

## 2023-06-24 NOTE — Patient Instructions (Signed)
 It was great to see you!  Start prednisone 2 tablets daily in the morning with food  Keep taking the claritin and flonase   Drink plenty of fluids   Let's follow-up if your symptoms worsen or don't improve.   Take care,  Rodman Pickle, NP

## 2023-06-24 NOTE — Telephone Encounter (Signed)
 Copied from CRM [id number]. Topic: Clinical - Medical Advice >> Jun 24, 2023  1:12 PM [place] E wrote: Reason for CRM: Patient calling in see if there is any antibiotic for a sore throat and runny nose, patient denied having a fever (Symptoms started on Monday 3/24). Agent tried to get patient in for acute care visit, but could not populate any times for today, patient did not want to go through the weekend without it.

## 2023-06-24 NOTE — Telephone Encounter (Signed)
 This RN attempted return call to patient. No answer. LVM. (Attempt #1)

## 2023-06-24 NOTE — Progress Notes (Signed)
 Acute Office Visit  Subjective:     Patient ID: Ruth [name], female    DOB: 1960-11-15, 63 y.o.   MRN: [id number]  Chief Complaint  Patient presents with   Sore Throat    Since Monday, sinus drainage, no fever, no headache    HPI Patient is in today for sore throat, raspy voice, post nasal drip.   Discussed the use of AI scribe software for clinical note transcription with the patient, who gave verbal consent to proceed.  History of Present Illness   The patient, with a history of allergies and asthma, presents with a five-day history of symptoms consistent with an allergy flare. She reports a raspy voice, postnasal drip, itching throat, and cough. She also notes mild ear pain and pressure. She denies fevers and significant headaches. Her throat is sore. She has been managing her symptoms with daily Claritin and Flonase. She notes that this pattern of symptoms occurs annually, and last year, she was treated with prednisone, which was effective.      ROS See pertinent positives and negatives per HPI.     Objective:    BP 116/88 (BP Location: Left Arm, Patient Position: Sitting, Cuff Size: Normal)   Pulse 81   Temp 98.1 F (36.7 C) (Oral)   Ht 5\' 5"  (1.651 m)   Wt 206 lb 3.2 oz (93.5 kg)   LMP 09/19/2015   SpO2 98%   BMI 34.31 kg/m    Physical Exam Vitals and nursing note reviewed.  Constitutional:      General: She is not in acute distress.    Appearance: Normal appearance.  HENT:     Head: Normocephalic.     Right Ear: Tympanic membrane, ear canal and external ear normal.     Left Ear: Tympanic membrane, ear canal and external ear normal.     Mouth/Throat:     Mouth: Mucous membranes are moist.     Pharynx: Posterior oropharyngeal erythema present.  Eyes:     Conjunctiva/sclera: Conjunctivae normal.  Cardiovascular:     Rate and Rhythm: Normal rate and regular rhythm.     Pulses: Normal pulses.     Heart sounds: Normal heart sounds.  Pulmonary:      Effort: Pulmonary effort is normal.     Breath sounds: Normal breath sounds.  Musculoskeletal:     Cervical back: Normal range of motion and neck supple. No tenderness.  Lymphadenopathy:     Cervical: No cervical adenopathy.  Skin:    General: Skin is warm.  Neurological:     General: No focal deficit present.     Mental Status: She is alert and oriented to person, place, and time.  Psychiatric:        Mood and Affect: Mood normal.        Behavior: Behavior normal.        Thought Content: Thought content normal.        Judgment: Judgment normal.      Assessment & Plan:   Problem List Items Addressed This Visit       Respiratory   Allergic rhinitis due to pollen - Primary   Symptoms align with allergic rhinitis and laryngitis, likely worsened by seasonal allergies. Start prednisone 40mg  once daily for 5 days in the morning with food. Continue Claritin and Flonase as prescribed. Increase fluid intake, including hot tea and honey. Rest her voice over the weekend.        Meds ordered this encounter  Medications  predniSONE (DELTASONE) 20 MG tablet    Sig: Take 2 tablets (40 mg total) by mouth daily with breakfast.    Dispense:  10 tablet    Refill:  0    Return if symptoms worsen or fail to improve.  Gerre Scull, NP

## 2023-06-24 NOTE — Telephone Encounter (Signed)
 Chief Complaint: Sore throat, runny nose Symptoms: see above Frequency: since Monday Pertinent Negatives: Patient denies fever, pus on tonsils Disposition: [] ED /[] Urgent Care (no appt availability in office) / [x] Appointment(In office/virtual)/ []  [facility]/ [] Home Care/ [] Refused Recommended Disposition /[] [facility]/ []  Follow-up with PCP Additional Notes: Patient called in requesting appt for today for evaluation of sore throat and runny nose. Patient states she deals with this yearly and when it gets to this level, she typically requires an antibiotic to get rid of it. Patient denies fever at this time. Appt made for this afternoon for further evaluation.   Reason for Disposition  [1] Sore throat with cough/cold symptoms AND [2] present > 5 days  Answer Assessment - Initial Assessment Questions 1. ONSET: "When did the throat start hurting?" (Hours or days ago)      Monday 2. SEVERITY: "How bad is the sore throat?" (Scale 1-10; mild, moderate or severe)   - MILD (1-3):  Doesn't interfere with eating or normal activities.   - MODERATE (4-7): Interferes with eating some solids and normal activities.   - SEVERE (8-10):  Excruciating pain, interferes with most normal activities.   - SEVERE WITH DYSPHAGIA (10): Can't swallow liquids, drooling.     4 3. STREP EXPOSURE: "Has there been any exposure to strep within the past week?" If Yes, ask: "What type of contact occurred?"      No 4.  VIRAL SYMPTOMS: "Are there any symptoms of a cold, such as a runny nose, cough, hoarse voice or red eyes?"      Runny nose, hoarse voice 5. FEVER: "Do you have a fever?" If Yes, ask: "What is your temperature, how was it measured, and when did it start?"     No 6. PUS ON THE TONSILS: "Is there pus on the tonsils in the back of your throat?"     No 7. OTHER SYMPTOMS: "Do you have any other symptoms?" (e.g., difficulty breathing, headache, rash)     Runny nose,  hoarseness  Protocols used: Sore Throat-A-AH

## 2023-06-24 NOTE — Assessment & Plan Note (Signed)
 Symptoms align with allergic rhinitis and laryngitis, likely worsened by seasonal allergies. Start prednisone 40mg  once daily for 5 days in the morning with food. Continue Claritin and Flonase as prescribed. Increase fluid intake, including hot tea and honey. Rest her voice over the weekend.

## 2023-07-05 ENCOUNTER — Telehealth (INDEPENDENT_AMBULATORY_CARE_PROVIDER_SITE_OTHER): Admitting: Psychology

## 2023-07-05 ENCOUNTER — Encounter (INDEPENDENT_AMBULATORY_CARE_PROVIDER_SITE_OTHER): Payer: Self-pay | Admitting: Family Medicine

## 2023-07-05 ENCOUNTER — Ambulatory Visit (INDEPENDENT_AMBULATORY_CARE_PROVIDER_SITE_OTHER): Admitting: Family Medicine

## 2023-07-05 VITALS — BP 113/78 | HR 91 | Temp 98.0°F | Ht 65.0 in | Wt 201.0 lb

## 2023-07-05 DIAGNOSIS — J301 Allergic rhinitis due to pollen: Secondary | ICD-10-CM

## 2023-07-05 DIAGNOSIS — F5089 Other specified eating disorder: Secondary | ICD-10-CM | POA: Diagnosis not present

## 2023-07-05 DIAGNOSIS — I10 Essential (primary) hypertension: Secondary | ICD-10-CM | POA: Diagnosis not present

## 2023-07-05 DIAGNOSIS — E669 Obesity, unspecified: Secondary | ICD-10-CM

## 2023-07-05 DIAGNOSIS — Z6833 Body mass index (BMI) 33.0-33.9, adult: Secondary | ICD-10-CM | POA: Diagnosis not present

## 2023-07-05 NOTE — Progress Notes (Signed)
  Office: [phone]  /  Fax: [phone]    Date: July 05, 2023  Appointment Start Time: 3:04pm Duration: 20 minutes Provider: Lawerance Cruel, Psy.D. Type of Session: Individual Therapy  Location of Patient: Parked in car at [employer] (private location) Location of Provider: Provider's Home (private office) Type of Contact: Telepsychological Visit via MyChart Video Visit  Session Content: Ruth [name] is a 63 y.o. female presenting for a follow-up appointment to address the previously established treatment goal of increasing coping skills.Today's appointment was a telepsychological visit. Ruth [name] provided verbal consent for today's telepsychological appointment and she is aware she is responsible for securing confidentiality on her end of the session. Prior to proceeding with today's appointment, Ruth [name]'s physical location at the time of this appointment was obtained as well a phone number she could be reached at in the event of technical difficulties. Ruth [name] and this provider participated in today's telepsychological service.   This provider conducted a brief check-in. Ruth [name] shared about engaging in "social eating." Further explored. Psychoeducation regarding triggers for emotional eating was provided. Ruth [name] was provided a handout, and encouraged to utilize the handout between now and the next appointment to increase awareness of triggers and frequency. Ruth [name] agreed. This provider also discussed behavioral strategies for specific triggers, such as placing the utensil down when conversing to avoid mindless eating. Ruth [name] provided verbal consent during today's appointment for this provider to send a handout about triggers via e-mail. Overall, Ruth [name] was receptive to today's appointment as evidenced by openness to sharing, responsiveness to feedback, and willingness to explore triggers for emotional eating.  Mental Status Examination:  Appearance: neat Behavior: appropriate to circumstances Mood:  neutral Affect: mood congruent Speech: WNL Eye Contact: appropriate Psychomotor Activity: WNL Gait: unable to assess Thought Process: linear, logical, and goal directed and no evidence or endorsement of suicidal, homicidal, and self-harm ideation, plan and intent  Thought Content/Perception: no hallucinations, delusions, bizarre thinking or behavior endorsed or observed Orientation: AAOx4 Memory/Concentration: intact Insight: fair Judgment: fair  Interventions:  Conducted a brief chart review Provided empathic reflections and validation Provided positive reinforcement Employed supportive psychotherapy interventions to facilitate reduced distress and to improve coping skills with identified stressors Psychoeducation provided regarding triggers for emotional eating behaviors Engaged in thought challenging   DSM-5 Diagnosis(es): F50.89 Other Specified Feeding or Eating Disorder, Emotional Eating Behaviors  Treatment Goal & Progress: During the initial appointment with this provider, the following treatment goal was established: increase coping skills. Progress is limited, as Ruth [name] has just begun treatment with this provider; however, she is receptive to the interaction and interventions and rapport is being established.   Plan: The next appointment is scheduled for 07/26/2023 at 11am, which will be via MyChart Video Visit. The next session will focus on working towards the established treatment goal. Ruth [name] will continue with her primary therapist.    Lawerance Cruel, PsyD

## 2023-07-05 NOTE — Progress Notes (Signed)
 Office: [phone]  /  Fax: [phone]  WEIGHT SUMMARY AND BIOMETRICS  Anthropometric Measurements Height: 5\' 5"  (1.651 m) Weight: 201 lb (91.2 kg) BMI (Calculated): 33.45 Weight at Last Visit: 203 lb Weight Lost Since Last Visit: 2 lb Weight Gained Since Last Visit: 0 Starting Weight: 221 lb Total Weight Loss (lbs): 20 lb (9.072 kg) Peak Weight: 230 lb   Body Composition  Body Fat %: 42.7 % Fat Mass (lbs): 86 lbs Muscle Mass (lbs): 109.4 lbs Total Body Water (lbs): 74.8 lbs Visceral Fat Rating : 12   Other Clinical Data Fasting: no Labs: no Today's Visit #: 6 Starting Date: 02/22/23    Chief Complaint: OBESITY   History of Present Illness Ruth [name] is a 63 year old female who presents for a follow-up on her obesity treatment plan and progress.  She follows her prescribed eating plan about ten percent of the time and engages in a variety of exercises for about sixty minutes twice a week. She has lost two pounds in the last month since her last visit.  She faces challenges with eating out frequently due to social events, especially during the spring and summer months. She finds it difficult to make the best food choices when dining out and needs guidance in this area. She is conscious of her protein intake but finds it challenging to meet her goals without meal prepping, which she did less of this past month.  She has increased her physical activity by joining a dance class, which she attends for three hours on Saturdays, and continues to use the elliptical twice a week for an hour each session. She enjoys the dance class as it does not feel like exercise to her.  She was on prednisone for a week due to severe allergies and laryngitis, which was a new experience for her as she usually manages her allergies with Claritin and Flonase. This was the first time she had laryngitis without a respiratory infection.  She is currently on a small dose of amlodipine  for blood pressure management and reports no issues with dizziness or lightheadedness, although she occasionally experiences these symptoms due to a past brain surgery. She feels more energetic and better overall since losing weight.      PHYSICAL EXAM:  Blood pressure 113/78, pulse 91, temperature 98 F (36.7 C), height 5\' 5"  (1.651 m), weight 201 lb (91.2 kg), last menstrual period 09/19/2015, SpO2 99%. Body mass index is 33.45 kg/m.  DIAGNOSTIC DATA REVIEWED:  BMET    Component Value Date/Time   NA 142 05/20/2023 0818   NA 144 10/05/2018 1004   K 3.9 05/20/2023 0818   CL 107 05/20/2023 0818   CO2 26 05/20/2023 0818   GLUCOSE 88 05/20/2023 0818   BUN 15 05/20/2023 0818   BUN 16 10/05/2018 1004   CREATININE 0.96 05/20/2023 0818   CALCIUM 9.5 05/20/2023 0818   GFRNONAA >60 02/16/2022 1350   GFRAA >60 07/21/2019 0937   Lab Results  Component Value Date   HGBA1C 5.8 (H) 02/22/2023   HGBA1C 5.7 (H) 07/21/2017   Lab Results  Component Value Date   INSULIN 8.4 02/22/2023   INSULIN 15.0 07/21/2017   Lab Results  Component Value Date   TSH 1.310 02/22/2023   CBC    Component Value Date/Time   WBC 3.6 (L) 05/20/2023 0818   RBC 4.86 05/20/2023 0818   HGB 13.6 05/20/2023 0818   HGB 13.1 02/16/2022 1350   HGB 14.2 04/13/2018 1001  HCT 42.0 05/20/2023 0818   HCT 43.1 04/13/2018 1001   PLT 237 05/20/2023 0818   PLT 240 02/16/2022 1350   PLT 235 04/13/2018 1001   MCV 86.4 05/20/2023 0818   MCV 85 04/13/2018 1001   MCH 28.0 05/20/2023 0818   MCHC 32.4 05/20/2023 0818   RDW 14.5 05/20/2023 0818   RDW 13.6 04/13/2018 1001   Iron Studies No results found for: "IRON", "TIBC", "FERRITIN", "IRONPCTSAT" Lipid Panel     Component Value Date/Time   CHOL 177 02/22/2023 0944   TRIG 73 02/22/2023 0944   HDL 66 02/22/2023 0944   CHOLHDL 2 01/19/2022 1402   VLDL 12.0 01/19/2022 1402   LDLCALC 97 02/22/2023 0944   LDLCALC 123 (H) 01/04/2020 1019   Hepatic Function Panel      Component Value Date/Time   PROT 7.6 05/20/2023 0818   PROT 7.2 10/05/2018 1004   ALBUMIN 4.2 04/06/2023 1332   ALBUMIN 4.3 10/05/2018 1004   AST 15 05/20/2023 0818   AST 36 02/16/2022 1350   ALT 10 05/20/2023 0818   ALT 28 02/16/2022 1350   ALKPHOS 144 (H) 04/06/2023 1332   BILITOT 0.4 05/20/2023 0818   BILITOT 0.8 02/16/2022 1350   BILIDIR 0.1 02/23/2021 0927   IBILI 0.3 08/24/2013 1633      Component Value Date/Time   TSH 1.310 02/22/2023 0944   Nutritional Lab Results  Component Value Date   VD25OH 53 02/16/2023   VD25OH 69 07/22/2022   VD25OH 29.88 (L) 03/31/2021     Assessment and Plan Assessment & Plan Obesity She adheres to her category two prescribed eating plan approximately ten percent of the time and engages in exercise, including dance classes, for about sixty minutes twice weekly. She has lost two pounds since her last visit. Challenges include frequent dining out during spring and summer, impacting healthy food choices. She recognizes the importance of protein intake and meal prepping in maintaining dietary goals. - Continue current exercise routine, including dance class and elliptical workouts. - Encourage meal prepping to improve adherence to dietary goals. - Provide guidance on making healthier food choices when dining out, focusing on simple meals with fewer ingredients and avoiding high-calorie sauces and dressings. - Schedule follow-up appointment in four weeks to assess progress.  Hypertension Her blood pressure is well-controlled at 113/78 mmHg with a low dose of amlodipine. She reports no symptoms of lightheadedness or dizziness, although she occasionally experiences these symptoms due to prior brain surgery. She feels better and more energetic after losing weight. - Continue current dose of amlodipine.  Allergic Rhinitis She experienced severe allergies and laryngitis, necessitating a five-day course of prednisone. Regular use of Claritin and  Flonase effectively manages her allergies and prevents respiratory infections. She understands the importance of consistent use of allergy medications, especially during high pollen seasons. - Continue daily use of Claritin and Flonase to manage allergies. - Advise on the importance of consistent use of allergy medications, especially during high pollen seasons.      She was informed of the importance of frequent follow up visits to maximize her success with intensive lifestyle modifications for her multiple health conditions.    Quillian Quince, MD

## 2023-07-11 ENCOUNTER — Encounter: Payer: Self-pay | Admitting: Family Medicine

## 2023-07-11 LAB — HM MAMMOGRAPHY

## 2023-07-11 MED ORDER — FLUTICASONE PROPIONATE 50 MCG/ACT NA SUSP: 2.0000 | Freq: Every day | NASAL | 6 refills | Status: AC

## 2023-07-18 ENCOUNTER — Encounter: Payer: Self-pay | Admitting: Family Medicine

## 2023-07-18 ENCOUNTER — Other Ambulatory Visit: Payer: Self-pay

## 2023-07-18 ENCOUNTER — Ambulatory Visit (INDEPENDENT_AMBULATORY_CARE_PROVIDER_SITE_OTHER): Payer: Managed Care, Other (non HMO)

## 2023-07-18 VITALS — BP 122/80 | HR 77 | Temp 98.5°F | Resp 18 | Ht 65.0 in | Wt 206.4 lb

## 2023-07-18 DIAGNOSIS — D869 Sarcoidosis, unspecified: Secondary | ICD-10-CM | POA: Diagnosis not present

## 2023-07-18 DIAGNOSIS — Z1211 Encounter for screening for malignant neoplasm of colon: Secondary | ICD-10-CM

## 2023-07-18 LAB — COMPREHENSIVE METABOLIC PANEL WITH GFR
AG Ratio: 1.2 (calc) (ref 1.0–2.5)
ALT: 15 U/L (ref 6–29)
AST: 17 U/L (ref 10–35)
Albumin: 3.8 g/dL (ref 3.6–5.1)
Alkaline phosphatase (APISO): 148 U/L (ref 37–153)
BUN: 13 mg/dL (ref 7–25)
CO2: 25 mmol/L (ref 20–32)
Calcium: 8.9 mg/dL (ref 8.6–10.4)
Chloride: 109 mmol/L (ref 98–110)
Creat: 0.83 mg/dL (ref 0.50–1.05)
Globulin: 3.1 g/dL (ref 1.9–3.7)
Glucose, Bld: 89 mg/dL (ref 65–99)
Potassium: 3.7 mmol/L (ref 3.5–5.3)
Sodium: 142 mmol/L (ref 135–146)
Total Bilirubin: 0.4 mg/dL (ref 0.2–1.2)
Total Protein: 6.9 g/dL (ref 6.1–8.1)
eGFR: 81 mL/min/{1.73_m2} (ref >=60)

## 2023-07-18 LAB — CBC WITH DIFFERENTIAL/PLATELET
Absolute Lymphocytes: 914 {cells}/uL (ref 850–3900)
Absolute Monocytes: 386 {cells}/uL (ref 200–950)
Basophils Absolute: 50 {cells}/uL (ref 0–200)
Basophils Relative: 1.5 %
Eosinophils Absolute: 228 {cells}/uL (ref 15–500)
Eosinophils Relative: 6.9 %
HCT: 39.2 % (ref 35.0–45.0)
Hemoglobin: 12.6 g/dL (ref 11.7–15.5)
MCH: 28 pg (ref 27.0–33.0)
MCHC: 32.1 g/dL (ref 32.0–36.0)
MCV: 87.1 fL (ref 80.0–100.0)
MPV: 10.8 fL (ref 7.5–12.5)
Monocytes Relative: 11.7 %
Neutro Abs: 1723 {cells}/uL (ref 1500–7800)
Neutrophils Relative %: 52.2 %
Platelets: 206 10*3/uL (ref 140–400)
RBC: 4.5 10*6/uL (ref 3.80–5.10)
RDW: 13.8 % (ref 11.0–15.0)
Total Lymphocyte: 27.7 %
WBC: 3.3 10*3/uL — ABNORMAL LOW (ref 3.8–10.8)

## 2023-07-18 MED ORDER — ACETAMINOPHEN 325 MG PO TABS
650.0000 mg | ORAL_TABLET | Freq: Once | ORAL | Status: AC
  Administered 2023-07-18: 650 mg via ORAL
  Filled 2023-07-18: qty 2

## 2023-07-18 MED ORDER — DIPHENHYDRAMINE HCL 25 MG PO CAPS
25.0000 mg | ORAL_CAPSULE | Freq: Once | ORAL | Status: AC
  Administered 2023-07-18: 25 mg via ORAL
  Filled 2023-07-18: qty 1

## 2023-07-18 MED ORDER — METHYLPREDNISOLONE SODIUM SUCC 40 MG IJ SOLR
40.0000 mg | Freq: Once | INTRAMUSCULAR | Status: AC
  Administered 2023-07-18: 40 mg via INTRAVENOUS
  Filled 2023-07-18: qty 1

## 2023-07-18 MED ORDER — SODIUM CHLORIDE 0.9 % IV SOLN
3.0000 mg/kg | Freq: Once | INTRAVENOUS | Status: AC
  Administered 2023-07-18: 300 mg via INTRAVENOUS
  Filled 2023-07-18: qty 30

## 2023-07-18 NOTE — Progress Notes (Signed)
 Diagnosis: Sarcoidosis  Provider:  Praveen Mannam MD  Procedure: IV Infusion  IV Type: Peripheral, IV Location: R Antecubital  Avsola  (infliximab -axxq), Dose: 300 mg  Infusion Start Time: 0858  Infusion Stop Time: 1116  Post Infusion IV Care: Peripheral IV Discontinued  Discharge: Condition: Good, Destination: Home . AVS Declined  Performed by:  Yelitza Reach, RN

## 2023-07-26 ENCOUNTER — Telehealth (INDEPENDENT_AMBULATORY_CARE_PROVIDER_SITE_OTHER): Admitting: Psychology

## 2023-08-01 LAB — COLOGUARD: COLOGUARD: NEGATIVE

## 2023-08-02 ENCOUNTER — Ambulatory Visit (INDEPENDENT_AMBULATORY_CARE_PROVIDER_SITE_OTHER): Admitting: Family Medicine

## 2023-08-02 ENCOUNTER — Encounter (INDEPENDENT_AMBULATORY_CARE_PROVIDER_SITE_OTHER): Payer: Self-pay | Admitting: Family Medicine

## 2023-08-02 VITALS — BP 118/84 | HR 72 | Temp 98.2°F | Ht 65.0 in | Wt 199.0 lb

## 2023-08-02 DIAGNOSIS — Z6833 Body mass index (BMI) 33.0-33.9, adult: Secondary | ICD-10-CM

## 2023-08-02 DIAGNOSIS — I10 Essential (primary) hypertension: Secondary | ICD-10-CM

## 2023-08-02 DIAGNOSIS — E669 Obesity, unspecified: Secondary | ICD-10-CM

## 2023-08-02 DIAGNOSIS — D869 Sarcoidosis, unspecified: Secondary | ICD-10-CM | POA: Diagnosis not present

## 2023-08-02 NOTE — Progress Notes (Signed)
 Office: [phone]  /  Fax: [phone]  WEIGHT SUMMARY AND BIOMETRICS  Anthropometric Measurements Height: 5\' 5"  (1.651 m) Weight: 199 lb (90.3 kg) BMI (Calculated): 33.12 Weight at Last Visit: 201 lb Weight Lost Since Last Visit: 2 lb Weight Gained Since Last Visit: 0 Starting Weight: 221 lb Total Weight Loss (lbs): 22 lb (9.979 kg) Peak Weight: 230 lb   Body Composition  Body Fat %: 42.6 % Fat Mass (lbs): 85 lbs Muscle Mass (lbs): 108.8 lbs Total Body Water (lbs): 76 lbs Visceral Fat Rating : 12   Other Clinical Data Fasting: No Labs: No Today's Visit #: 7 Starting Date: 02/22/23    Chief Complaint: OBESITY    History of Present Illness Ruth [name] is a 63 year old female with obesity and hypertension who presents for obesity treatment and progress assessment.  She follows her prescribed category two eating plan about ten percent of the time. Despite this, she has lost two pounds in the last month, attributing her weight loss to increased physical activity, including using her elliptical, dancing, and walking for forty-five to sixty minutes three times per week. She recently joined a dance group, rehearsing for two hours on Mondays, and occasionally attends the gym for elliptical workouts and cycling classes. She notes that social eating, especially during summer, is a challenge, and she struggles with food preparation and social invitations involving food.  She has a history of hypertension and is currently taking Norvasc  5 mg daily. Her blood pressure is well-controlled at 118/84 mmHg. She attributes her well-controlled blood pressure to her medication, diet, exercise, and weight loss.  She also has a history of sarcoidosis and has been receiving infusions once every eight weeks since January, which have helped her condition. She previously experienced weight gain due to steroid use, which was necessary for her sarcoidosis management, but she is no longer  on prednisone .      PHYSICAL EXAM:  Blood pressure 118/84, pulse 72, temperature 98.2 F (36.8 C), height 5\' 5"  (1.651 m), weight 199 lb (90.3 kg), last menstrual period 09/19/2015, SpO2 96%. Body mass index is 33.12 kg/m.  DIAGNOSTIC DATA REVIEWED:  BMET    Component Value Date/Time   NA 142 07/18/2023 0813   NA 144 10/05/2018 1004   K 3.7 07/18/2023 0813   CL 109 07/18/2023 0813   CO2 25 07/18/2023 0813   GLUCOSE 89 07/18/2023 0813   BUN 13 07/18/2023 0813   BUN 16 10/05/2018 1004   CREATININE 0.83 07/18/2023 0813   CALCIUM 8.9 07/18/2023 0813   GFRNONAA >60 02/16/2022 1350   GFRAA >60 07/21/2019 0937   Lab Results  Component Value Date   HGBA1C 5.8 (H) 02/22/2023   HGBA1C 5.7 (H) 07/21/2017   Lab Results  Component Value Date   INSULIN  8.4 02/22/2023   INSULIN  15.0 07/21/2017   Lab Results  Component Value Date   TSH 1.310 02/22/2023   CBC    Component Value Date/Time   WBC 3.3 (L) 07/18/2023 0813   RBC 4.50 07/18/2023 0813   HGB 12.6 07/18/2023 0813   HGB 13.1 02/16/2022 1350   HGB 14.2 04/13/2018 1001   HCT 39.2 07/18/2023 0813   HCT 43.1 04/13/2018 1001   PLT 206 07/18/2023 0813   PLT 240 02/16/2022 1350   PLT 235 04/13/2018 1001   MCV 87.1 07/18/2023 0813   MCV 85 04/13/2018 1001   MCH 28.0 07/18/2023 0813   MCHC 32.1 07/18/2023 0813   RDW 13.8 07/18/2023 0813  RDW 13.6 04/13/2018 1001   Iron Studies No results found for: "IRON", "TIBC", "FERRITIN", "IRONPCTSAT" Lipid Panel     Component Value Date/Time   CHOL 177 02/22/2023 0944   TRIG 73 02/22/2023 0944   HDL 66 02/22/2023 0944   CHOLHDL 2 01/19/2022 1402   VLDL 12.0 01/19/2022 1402   LDLCALC 97 02/22/2023 0944   LDLCALC 123 (H) 01/04/2020 1019   Hepatic Function Panel     Component Value Date/Time   PROT 6.9 07/18/2023 0813   PROT 7.2 10/05/2018 1004   ALBUMIN 4.2 04/06/2023 1332   ALBUMIN 4.3 10/05/2018 1004   AST 17 07/18/2023 0813   AST 36 02/16/2022 1350   ALT 15  07/18/2023 0813   ALT 28 02/16/2022 1350   ALKPHOS 144 (H) 04/06/2023 1332   BILITOT 0.4 07/18/2023 0813   BILITOT 0.8 02/16/2022 1350   BILIDIR 0.1 02/23/2021 0927   IBILI 0.3 08/24/2013 1633      Component Value Date/Time   TSH 1.310 02/22/2023 0944   Nutritional Lab Results  Component Value Date   VD25OH 53 02/16/2023   VD25OH 69 07/22/2022   VD25OH 29.88 (L) 03/31/2021     Assessment and Plan Assessment & Plan Sarcoidosis Sarcoidosis is managed with infusions every 8 weeks. She reports no significant side effects. Previous weight gain was attributed to steroid use, which has been discontinued. Infusions are preferred due to weight neutrality. - Continue infusion therapy every 8 weeks -Will work on adding in more strengthening exercises after infusion is completed.  Hypertension Hypertension is well controlled with current medication regimen and lifestyle changes. Blood pressure is 118/84 mmHg. - Continue Norvasc  5 mg daily - Consider discontinuing Norvasc  in the future if blood pressure remains well controlled with lifestyle changes  Obesity Obesity management is ongoing with a 2-pound weight loss in the last month. She engages in regular physical activity, including using an elliptical, dancing, and walking. Challenges include adherence to the eating plan and social eating during summer months. Strategies to manage social eating and maintain dietary goals were discussed. - Continue Category 2 eating plan - Increase physical activity with dance group, gym workouts, and variety of exercises - Implement social eating strategies, such as discussing health goals with friends and protein loading before social events - Provide recipes to prevent food boredom and support dietary goals - Follow up in 4 weeks      She was informed of the importance of frequent follow up visits to maximize her success with intensive lifestyle modifications for her multiple health conditions.     Jasmine Mesi, MD

## 2023-08-15 NOTE — Progress Notes (Unsigned)
 Office Visit Note  Patient: Ruth [name]             Date of Birth: January 28, 1961           MRN: [id number]             PCP: Estill Hemming, DO Referring: Estill Hemming, * Visit Date: 08/26/2023   Subjective:  No chief complaint on file.   History of Present Illness: Ruth [name] is a 63 y.o. female here for follow up for sarcoidosis with bulky cervical lymphadenopathy now on Avsola  infusions 3 mg/kg started January 9th and azathioprine  50 mg daily.    Previous HPI 05/20/2023 Ruth [name] is a 63 y.o. female here for follow up for sarcoidosis with bulky cervical lymphadenopathy now on Avsola  infusions 3 mg/kg started January 9th and azathioprine  100 mg daily.     She is currently undergoing treatment for sarcoidosis with Evzola infusions, which began in January. She is in the loading stage, receiving infusions every four to eight weeks. Her ACE levels, previously elevated due to sarcoidosis, decreased slightly with steroid use but increased again when steroids were reduced. She takes Imuran  to prevent antibody formation against the infusion medication, experiencing constipation as a side effect. No significant side effects from the infusions are noted, except for initial sluggishness that resolves by the next day.   She experiences joint pain, particularly in her hands, which is less severe than during her last visit. Morning stiffness improves throughout the day. No radiating pain or pain in her feet and ankles is reported.   No new rashes or swelling, except for a recurring rash on her buttocks, which she associates with sarcoidosis.   No recent illnesses except for sinus issues and post-nasal drip. Overall, she feels better with less joint pain and increased energy levels. She also made a recent switch from a prescription vitamin D  to over-the-counter vitamin D3.        Previous HPI 02/16/2023 Ruth [name] is a 63 y.o. female here for follow up for  sarcoidosis with bulky cervical lymphadenopathy now on azathioprine  100 mg daily.  She tapered off of prednisone  completely since our last visit.  Since stopping the medication noticed increased in joint stiffness and achiness in multiple areas.  Not with any visible swelling.  Seems worse first thing in the morning and getting up after she sits for more than a few minutes at a time.  She is noticing nausea and decreased appetite from the azathioprine  also noticed a darker color to her urine.  No new skin rashes.  Still has ongoing lymph node swelling but decreased size from before.   Previous HPI 12/16/2022 Ruth [name] is a 63 y.o. female here for follow up for sarcoidosis with bulky cervical lymphadenopathy on prednisone  10 mg daily.  She developed severe side effects from methotrexate  after last visit and stopped taking the medication.  She was experiencing dizziness and severe fatigue lasting for 3 to 4 days at a time after each dose.  When she stopped the methotrexate  after a week she felt back to her normal state.  We discussed starting azathioprine  as an alternate steroid sparing DMARD but she did not begin this with concerns about the medication after her bad experience of methotrexate .  Has been taking the prednisone  10 mg daily and feels that the large swollen nodules in her neck have continued to improve.  No new chest pain shortness of breath or coughing.  She does have a large amount of unintentional weight gain from the prolonged steroids estimates about 37 pounds.   Previous HPI 10/07/2022 Ruth [name] is a 63 y.o. female here for follow up for sarcoidosis with bulky cervical lymphadenopathy now on methotrexate  15 mg p.o. weekly folic acid  1 mg daily and tapered down off prednisone  after last visit.  She is only started the methotrexate  in the past 2 weeks was initially delaying this as she was at the hospital supporting her daughter who was admitted with major pregnancy complications.   X-ray obtained did not suggest any mediastinal lymphadenopathy.  Symptoms overall pretty stable still with bulky swallowing throughout the neck but not particularly painful.   Previous HPI 08/31/22 Ruth [name] is a 62 y.o. female here for follow up for sarcoidosis with fatigue and bulky lymphadenopathy now on prednisone  20 mg daily.  She has seen improvement of the large lymph node swelling.  Has noticed several side effects with unintentional weight gain, blurry vision, and cramping in her calfs and toes.  She has been trying to moderate her diet but definitely still increased appetite and becoming very tired shortly after eating.  Joint pain and stiffness including low back pain is doing great on the steroids.   Previous HPI 07/22/22 Ruth [name] is a 63 y.o. female here for evaluation of possible sarcoidosis with cervical lymphadenopathy evaluated with excisional biopsy.  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  Lymph node swelling started in July of last year she did not recall any specific preceding major illness or medical event.  Her initial visit with PCP labs did have positive IgM and IgG for EBV but without known sick contact for infectious mononucleosis.  She was initially treated with Keflex  and saw temporary improvement in symptoms but swelling has returned and been present continuously for at least the past 8 months.  Has been associated with some fatigue and had some tooth pain otherwise does not recall major change in symptoms otherwise.  Due to persistent lymphadenopathy she had CT imaging of the neck concerning for lymphoma and saw Dr. Maria Shiner with oncology.  Lab testing at the time showed a low positive ANA and she has a persistent elevated alkaline phosphatase attributed to gallbladder disease with multiple stones but minimal symptoms.  Subsequently went  for cervical lymph node biopsy with ENT Dr. Tellis Feathers this was negative for malignancy and suspicious for sarcoidosis or other reactive or autoimmune inflammatory process. She has chronic persistent moderate asthma on inhaler treatments and intermittently requires systemic steroids last treatment in March due to exacerbation with 1 week of prednisone .  But has not noticed cough dyspnea chest pain or other respiratory complaints outside of the usual for her asthma and allergies. She has a long history of eczema this was severe as a child with mild persistent symptoms still ongoing.  There are a few areas of permanent residual hypopigmentation.  She had a few skin colored raised nodules along the corners of the mouth and on her back evaluated by dermatology last year with biopsy showing some interstitial granulomatous process. She has chronic joint pain and stiffness most often her legs with trouble from hip and knee pain that is worse trying to get up from the floor.  Occasionally gets pain in her hands and feet but without visible swelling.  She does not take any daily medication for joint pains. She denies any new  visual changes eye pain or inflammation.  Does have chronically dry eyes but thinks this may be related to her allergies or antihistamines.  No new hair loss, no photosensitivity, no Raynaud's symptoms.   02/05/22 CT Neck IMPRESSION: Widespread lymphadenopathy throughout all cervical nodal stations, symmetric from right to left. This includes enlargement of intraparotid lymph nodes. No single dominant node or necrotic node. Bilateral axillary lymphadenopathy also present. 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 Possible reactive nodal enlargement to some sort of systemic infection or autoimmune disease is also possible.   No Rheumatology ROS completed.   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   Preventative health care 07/07/2020   SAH (subarachnoid hemorrhage) (HCC) 07/21/2019   RUQ pain 06/18/2019   Dyspepsia 06/18/2019   Moderate persistent asthma with acute exacerbation 03/05/2019   Vitamin D  deficiency 07/03/2018   Depression 07/03/2018   Class 2 severe obesity with serious comorbidity and body mass index (BMI) of 37.0 to 37.9 in adult [facility]) 07/03/2018   Essential hypertension 09/19/2017   Aneurysm, cerebral, nonruptured 05/06/2015   White matter abnormality on MRI of brain 05/06/2015   Migraine headache without aura 05/06/2015   Dizziness 05/06/2015   Foreign body of ear, left 09/05/2012   HTN (hypertension) 06/12/2012   Allergic rhinitis due to pollen 12/27/2007   Allergic-infective asthma 08/29/2006   ECZEMA 08/29/2006    Past Medical History:  Diagnosis Date   Allergic rhinitis, cause unspecified    Asthma    Constipation    Eczema    Food allergy     Gallbladder problem    Headache    Hypertension    Joint pain    Vitamin D  deficiency     Family History  Problem Relation Age of Onset   Asthma Mother    Allergies Mother    Asthma Father    Drug abuse Father    Allergies Father    Past Surgical History:  Procedure Laterality Date   BRAIN SURGERY     CESAREAN SECTION Bilateral [id number]   Social History   Social History Narrative   Exercise--no   Right handed   Caffiene1 cup   Lives alone and a two story home   Immunization History  Administered Date(s) Administered   PFIZER(Purple  Top)SARS-COV-2 Vaccination 10/19/2019, 11/09/2019   Td 05/27/2012     Objective: Vital Signs: LMP 09/19/2015    Physical Exam   Musculoskeletal Exam: ***  CDAI Exam: CDAI Score: -- Patient Global: --; Provider Global: -- Swollen: --; Tender: -- Joint Exam 08/26/2023   No joint exam has been documented for this visit   There is currently no information documented on the homunculus. Go to the Rheumatology activity and complete the homunculus joint exam.  Investigation: No additional findings.  Imaging: No results found.  Recent Labs: Lab Results  Component Value Date   WBC 3.3 (L) 07/18/2023   HGB 12.6 07/18/2023   PLT 206 07/18/2023   NA 142 07/18/2023   K 3.7  07/18/2023   CL 109 07/18/2023   CO2 25 07/18/2023   GLUCOSE 89 07/18/2023   BUN 13 07/18/2023   CREATININE 0.83 07/18/2023   BILITOT 0.4 07/18/2023   ALKPHOS 144 (H) 04/06/2023   AST 17 07/18/2023   ALT 15 07/18/2023   PROT 6.9 07/18/2023   ALBUMIN 4.2 04/06/2023   CALCIUM 8.9 07/18/2023   GFRAA >60 07/21/2019   QFTBGOLDPLUS NEGATIVE 03/21/2023    Speciality Comments: No specialty comments available.  Procedures:  No procedures performed Allergies: Other and Chlorhexidine gluconate   Assessment / Plan:     Visit Diagnoses: No diagnosis found.  ***  Orders: No orders of the defined types were placed in this encounter.  No orders of the defined types were placed in this encounter.    Follow-Up Instructions: No follow-ups on file.   Glena Landau, RT  Note - This record has been created using [employer].  Chart creation errors have been sought, but may not always  have been located. Such creation errors do not reflect on  the standard of medical care.

## 2023-08-26 ENCOUNTER — Encounter: Payer: Self-pay | Admitting: Internal Medicine

## 2023-08-26 ENCOUNTER — Ambulatory Visit: Payer: Managed Care, Other (non HMO) | Attending: Internal Medicine | Admitting: Internal Medicine

## 2023-08-26 VITALS — BP 115/78 | HR 78 | Resp 14 | Ht 64.0 in | Wt 204.0 lb

## 2023-08-26 DIAGNOSIS — D869 Sarcoidosis, unspecified: Secondary | ICD-10-CM | POA: Diagnosis not present

## 2023-08-26 DIAGNOSIS — E559 Vitamin D deficiency, unspecified: Secondary | ICD-10-CM | POA: Diagnosis not present

## 2023-08-26 DIAGNOSIS — Z79899 Other long term (current) drug therapy: Secondary | ICD-10-CM

## 2023-08-26 MED ORDER — AZATHIOPRINE 50 MG PO TABS: 50.0000 mg | ORAL_TABLET | Freq: Every day | ORAL | 0 refills | Status: DC

## 2023-09-01 LAB — CBC WITH DIFFERENTIAL/PLATELET
Absolute Lymphocytes: 1092 {cells}/uL (ref 850–3900)
Absolute Monocytes: 374 {cells}/uL (ref 200–950)
Basophils Absolute: 50 {cells}/uL (ref 0–200)
Basophils Relative: 1.2 %
Eosinophils Absolute: 172 {cells}/uL (ref 15–500)
Eosinophils Relative: 4.1 %
HCT: 43.4 % (ref 35.0–45.0)
Hemoglobin: 13.6 g/dL (ref 11.7–15.5)
MCH: 27.8 pg (ref 27.0–33.0)
MCHC: 31.3 g/dL — ABNORMAL LOW (ref 32.0–36.0)
MCV: 88.8 fL (ref 80.0–100.0)
MPV: 11 fL (ref 7.5–12.5)
Monocytes Relative: 8.9 %
Neutro Abs: 2512 {cells}/uL (ref 1500–7800)
Neutrophils Relative %: 59.8 %
Platelets: 228 10*3/uL (ref 140–400)
RBC: 4.89 10*6/uL (ref 3.80–5.10)
RDW: 13.6 % (ref 11.0–15.0)
Total Lymphocyte: 26 %
WBC: 4.2 10*3/uL (ref 3.8–10.8)

## 2023-09-01 LAB — ANGIOTENSIN CONVERTING ENZYME: Angiotensin-Converting Enzyme: 69 U/L — ABNORMAL HIGH (ref 9–67)

## 2023-09-01 LAB — VITAMIN D 1,25 DIHYDROXY
Vitamin D 1, 25 (OH)2 Total: 42 pg/mL (ref 18–72)
Vitamin D2 1, 25 (OH)2: <8 pg/mL
Vitamin D3 1, 25 (OH)2: 42 pg/mL

## 2023-09-12 ENCOUNTER — Encounter: Payer: Self-pay | Admitting: Family Medicine

## 2023-09-12 ENCOUNTER — Ambulatory Visit

## 2023-09-12 ENCOUNTER — Other Ambulatory Visit: Payer: Self-pay

## 2023-09-12 VITALS — BP 126/84 | HR 81 | Temp 97.8°F | Resp 16 | Ht 64.0 in | Wt 204.0 lb

## 2023-09-12 DIAGNOSIS — D869 Sarcoidosis, unspecified: Secondary | ICD-10-CM

## 2023-09-12 MED ORDER — BREO ELLIPTA 100-25 MCG/ACT IN AEPB: 1.0000 | INHALATION_SPRAY | Freq: Every day | RESPIRATORY_TRACT | 2 refills | Status: DC

## 2023-09-12 MED ORDER — METHYLPREDNISOLONE SODIUM SUCC 40 MG IJ SOLR
40.0000 mg | Freq: Once | INTRAMUSCULAR | Status: AC
  Administered 2023-09-12: 40 mg via INTRAVENOUS
  Filled 2023-09-12: qty 1

## 2023-09-12 MED ORDER — DIPHENHYDRAMINE HCL 25 MG PO CAPS
25.0000 mg | ORAL_CAPSULE | Freq: Once | ORAL | Status: AC
  Administered 2023-09-12: 25 mg via ORAL
  Filled 2023-09-12: qty 1

## 2023-09-12 MED ORDER — SODIUM CHLORIDE 0.9 % IV SOLN
3.0000 mg/kg | Freq: Once | INTRAVENOUS | Status: AC
  Administered 2023-09-12: 300 mg via INTRAVENOUS
  Filled 2023-09-12: qty 30

## 2023-09-12 MED ORDER — ACETAMINOPHEN 325 MG PO TABS
650.0000 mg | ORAL_TABLET | Freq: Once | ORAL | Status: AC
  Administered 2023-09-12: 650 mg via ORAL
  Filled 2023-09-12: qty 2

## 2023-09-12 NOTE — Progress Notes (Signed)
 Diagnosis: Sarcoidosis  Provider:  Phyllis Breeze MD  Procedure: IV Infusion  IV Type: Peripheral, IV Location: R Antecubital  Avsola  (infliximab -axxq), Dose: 300 mg  Infusion Start Time: 0853  Infusion Stop Time: 1101  Post Infusion IV Care: Peripheral IV Discontinued  Discharge: Condition: Good, Destination: Home . AVS Declined  Performed by:  Star East, LPN

## 2023-09-13 LAB — COMPREHENSIVE METABOLIC PANEL WITH GFR
AG Ratio: 1.2 (calc) (ref 1.0–2.5)
ALT: 19 U/L (ref 6–29)
AST: 24 U/L (ref 10–35)
Albumin: 4.1 g/dL (ref 3.6–5.1)
Alkaline phosphatase (APISO): 154 U/L — ABNORMAL HIGH (ref 37–153)
BUN: 18 mg/dL (ref 7–25)
CO2: 23 mmol/L (ref 20–32)
Calcium: 8.9 mg/dL (ref 8.6–10.4)
Chloride: 108 mmol/L (ref 98–110)
Creat: 0.87 mg/dL (ref 0.50–1.05)
Globulin: 3.3 g/dL (ref 1.9–3.7)
Glucose, Bld: 87 mg/dL (ref 65–99)
Potassium: 4.9 mmol/L (ref 3.5–5.3)
Sodium: 140 mmol/L (ref 135–146)
Total Bilirubin: 0.5 mg/dL (ref 0.2–1.2)
Total Protein: 7.4 g/dL (ref 6.1–8.1)
eGFR: 76 mL/min/{1.73_m2} (ref >=60)

## 2023-09-13 LAB — CBC WITH DIFFERENTIAL/PLATELET
Absolute Lymphocytes: 1180 {cells}/uL (ref 850–3900)
Absolute Monocytes: 399 {cells}/uL (ref 200–950)
Basophils Absolute: 29 {cells}/uL (ref 0–200)
Basophils Relative: 0.7 %
Eosinophils Absolute: 160 {cells}/uL (ref 15–500)
Eosinophils Relative: 3.8 %
HCT: 41.9 % (ref 35.0–45.0)
Hemoglobin: 13.4 g/dL (ref 11.7–15.5)
MCH: 28.5 pg (ref 27.0–33.0)
MCHC: 32 g/dL (ref 32.0–36.0)
MCV: 89.1 fL (ref 80.0–100.0)
MPV: 10.8 fL (ref 7.5–12.5)
Monocytes Relative: 9.5 %
Neutro Abs: 2432 {cells}/uL (ref 1500–7800)
Neutrophils Relative %: 57.9 %
Platelets: 204 10*3/uL (ref 140–400)
RBC: 4.7 10*6/uL (ref 3.80–5.10)
RDW: 14.6 % (ref 11.0–15.0)
Total Lymphocyte: 28.1 %
WBC: 4.2 10*3/uL (ref 3.8–10.8)

## 2023-09-22 ENCOUNTER — Encounter (INDEPENDENT_AMBULATORY_CARE_PROVIDER_SITE_OTHER): Payer: Self-pay | Admitting: Family Medicine

## 2023-09-22 ENCOUNTER — Ambulatory Visit (INDEPENDENT_AMBULATORY_CARE_PROVIDER_SITE_OTHER): Admitting: Family Medicine

## 2023-09-22 VITALS — BP 112/77 | HR 76 | Temp 97.8°F | Ht 65.0 in | Wt 201.0 lb

## 2023-09-22 DIAGNOSIS — R7303 Prediabetes: Secondary | ICD-10-CM | POA: Diagnosis not present

## 2023-09-22 DIAGNOSIS — E669 Obesity, unspecified: Secondary | ICD-10-CM | POA: Diagnosis not present

## 2023-09-22 DIAGNOSIS — I10 Essential (primary) hypertension: Secondary | ICD-10-CM

## 2023-09-22 DIAGNOSIS — Z6833 Body mass index (BMI) 33.0-33.9, adult: Secondary | ICD-10-CM

## 2023-09-22 MED ORDER — METFORMIN HCL 500 MG PO TABS: 500.0000 mg | ORAL_TABLET | Freq: Every day | ORAL | 0 refills | Status: DC

## 2023-09-22 NOTE — Progress Notes (Signed)
 Office: [phone]  /  Fax: [phone]  WEIGHT SUMMARY AND BIOMETRICS  Anthropometric Measurements Height: 5' 5 (1.651 m) Weight: 201 lb (91.2 kg) BMI (Calculated): 33.45 Weight at Last Visit: 199 lb Weight Lost Since Last Visit: 0 Weight Gained Since Last Visit: 2 lb Starting Weight: 221 lb Total Weight Loss (lbs): 20 lb (9.072 kg) Peak Weight: 230 lb   Body Composition  Body Fat %: 42.7 % Fat Mass (lbs): 86 lbs Muscle Mass (lbs): 109.8 lbs Total Body Water (lbs): 75.6 lbs Visceral Fat Rating : 12   Other Clinical Data Fasting: Yes Labs: No Today's Visit #: 8 Starting Date: 02/22/23    Chief Complaint: OBESITY   History of Present Illness Ruth [name] is a 63 year old female who presents for obesity treatment.  She has been prescribed the category two eating plan but has not been adhering to it. She exercises two to three times a week, engaging in activities such as using the elliptical and line dancing. Despite these efforts, she has gained two pounds over the last two months.  She mentions a recent increase in sweet cravings, particularly after consuming ice cream, which she describes as sending her to 'sweet tooth heaven every day.' This has contributed to her difficulty in adhering to her dietary plan.  Her recent hemoglobin A1c was 5.8, indicating prediabetes. She is currently on medications for her conditions and is open to exploring new dietary plans, including a pescatarian option.      PHYSICAL EXAM:  Blood pressure 112/77, pulse 76, temperature 97.8 F (36.6 C), height 5' 5 (1.651 m), weight 201 lb (91.2 kg), last menstrual period 09/19/2015, SpO2 99%. Body mass index is 33.45 kg/m.  DIAGNOSTIC DATA REVIEWED:  BMET    Component Value Date/Time   NA 140 09/12/2023 0810   NA 144 10/05/2018 1004   K 4.9 09/12/2023 0810   CL 108 09/12/2023 0810   CO2 23 09/12/2023 0810   GLUCOSE 87 09/12/2023 0810   BUN 18 09/12/2023 0810   BUN 16  10/05/2018 1004   CREATININE 0.87 09/12/2023 0810   CALCIUM 8.9 09/12/2023 0810   GFRNONAA >60 02/16/2022 1350   GFRAA >60 07/21/2019 0937   Lab Results  Component Value Date   HGBA1C 5.8 (H) 02/22/2023   HGBA1C 5.7 (H) 07/21/2017   Lab Results  Component Value Date   INSULIN  8.4 02/22/2023   INSULIN  15.0 07/21/2017   Lab Results  Component Value Date   TSH 1.310 02/22/2023   CBC    Component Value Date/Time   WBC 4.2 09/12/2023 0810   RBC 4.70 09/12/2023 0810   HGB 13.4 09/12/2023 0810   HGB 13.1 02/16/2022 1350   HGB 14.2 04/13/2018 1001   HCT 41.9 09/12/2023 0810   HCT 43.1 04/13/2018 1001   PLT 204 09/12/2023 0810   PLT 240 02/16/2022 1350   PLT 235 04/13/2018 1001   MCV 89.1 09/12/2023 0810   MCV 85 04/13/2018 1001   MCH 28.5 09/12/2023 0810   MCHC 32.0 09/12/2023 0810   RDW 14.6 09/12/2023 0810   RDW 13.6 04/13/2018 1001   Iron Studies No results found for: IRON, TIBC, FERRITIN, IRONPCTSAT Lipid Panel     Component Value Date/Time   CHOL 177 02/22/2023 0944   TRIG 73 02/22/2023 0944   HDL 66 02/22/2023 0944   CHOLHDL 2 01/19/2022 1402   VLDL 12.0 01/19/2022 1402   LDLCALC 97 02/22/2023 0944   LDLCALC 123 (H) 01/04/2020 1019  Hepatic Function Panel     Component Value Date/Time   PROT 7.4 09/12/2023 0810   PROT 7.2 10/05/2018 1004   ALBUMIN 4.2 04/06/2023 1332   ALBUMIN 4.3 10/05/2018 1004   AST 24 09/12/2023 0810   AST 36 02/16/2022 1350   ALT 19 09/12/2023 0810   ALT 28 02/16/2022 1350   ALKPHOS 144 (H) 04/06/2023 1332   BILITOT 0.5 09/12/2023 0810   BILITOT 0.8 02/16/2022 1350   BILIDIR 0.1 02/23/2021 0927   IBILI 0.3 08/24/2013 1633      Component Value Date/Time   TSH 1.310 02/22/2023 0944   Nutritional Lab Results  Component Value Date   VD25OH 53 02/16/2023   VD25OH 69 07/22/2022   VD25OH 29.88 (L) 03/31/2021     Assessment and Plan Assessment & Plan Obesity She has gained two pounds in the last two months and  has not adhered to the prescribed category two eating plan. She exercises two to three times a week. Discussed the impact of insulin  on cravings and weight gain, emphasizing the importance of addressing dietary habits. She expressed readiness to follow a more structured plan and was provided with options including a pescetarian plan and the category two plan. The pescetarian plan may be alternated with the category two plan on different days. The pescetarian plan is lighter, with protein primarily from seafood, and does not include snack calories. - Provide a copy of the pescetarian eating plan - Provide a copy of the category two eating plan - Encourage continuation of exercise regimen  Prediabetes Her last hemoglobin A1c was 5.8, indicating prediabetes. Explained the role of insulin  in weight gain and carbohydrate cravings. Discussed a medication that helps insulin  work more effectively, potentially aiding in weight loss and diabetes prevention. She agreed to try the medication, starting at a low dose. Side effects include potential queasiness if taken on an empty stomach and diarrhea if consuming too many calories or carbohydrates at once. The medication is safe with current medications and does not require weaning off unless on high doses. - Prescribe medication to improve insulin  effectiveness - Start medication at a low dose - Provide handout on insulin  resistance and prediabetes  Hypertension Blood pressure is well-controlled with current medications. Mentioned the possibility of reducing medication if weight loss is achieved. - Continue current hypertension medications   She was informed of the importance of frequent follow up visits to maximize her success with intensive lifestyle modifications for her multiple health conditions.    Louann Penton, MD

## 2023-09-29 ENCOUNTER — Encounter: Payer: Self-pay | Admitting: Family Medicine

## 2023-09-29 ENCOUNTER — Ambulatory Visit: Admitting: Family Medicine

## 2023-09-29 VITALS — BP 110/80 | HR 95 | Temp 99.1°F | Resp 18 | Ht 65.0 in | Wt 201.6 lb

## 2023-09-29 DIAGNOSIS — J0110 Acute frontal sinusitis, unspecified: Secondary | ICD-10-CM | POA: Diagnosis not present

## 2023-09-29 DIAGNOSIS — R52 Pain, unspecified: Secondary | ICD-10-CM

## 2023-09-29 DIAGNOSIS — N760 Acute vaginitis: Secondary | ICD-10-CM | POA: Diagnosis not present

## 2023-09-29 LAB — POC COVID19 BINAXNOW: SARS Coronavirus 2 Ag: NEGATIVE

## 2023-09-29 MED ORDER — PROMETHAZINE-DM 6.25-15 MG/5ML PO SYRP: 5.0000 mL | ORAL_SOLUTION | Freq: Four times a day (QID) | ORAL | 0 refills | Status: DC | PRN

## 2023-09-29 MED ORDER — AMOXICILLIN-POT CLAVULANATE 875-125 MG PO TABS: 1.0000 | ORAL_TABLET | Freq: Two times a day (BID) | ORAL | 0 refills | Status: DC

## 2023-09-29 MED ORDER — FLUCONAZOLE 150 MG PO TABS: 150.0000 mg | ORAL_TABLET | Freq: Every day | ORAL | 0 refills | Status: AC

## 2023-09-29 NOTE — Patient Instructions (Signed)

## 2023-09-29 NOTE — Progress Notes (Signed)
 Established Patient Office Visit  Subjective   Patient ID: Ruth [name], female    DOB: 1960-08-03  Age: 63 y.o. MRN: [id number]  Chief Complaint  Patient presents with   Generalized Body Aches    3 days, pt states have cough, congestion and body aches. No covid test.     HPI Discussed the use of AI scribe software for clinical note transcription with the patient, who gave verbal consent to proceed.  History of Present Illness Ruth [name] is a 63 year old female who presents with congestion and postnasal drip.  She has been experiencing congestion primarily in the head with some chest involvement, along with postnasal drip that has affected her sleep quality. Her throat feels scratchy, and she has sinus pressure.  She has had a fever with the highest recorded temperature being 100.47F and experiences chills. She has not been taking any Tylenol  due to not having any at home.  Her cough is dry. She has not been using any nasal sprays and is currently taking Zyrtec , although she feels it is not effective for her postnasal drip.  The symptoms began on Tuesday, with a significant worsening by Wednesday. She mentions experiencing similar symptoms every season.  No sore throat, but she notes a scratchy sensation. She has not tested positive for COVID recently.     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   Preventative health care 07/07/2020   SAH  (subarachnoid hemorrhage) (HCC) 07/21/2019   RUQ pain 06/18/2019   Dyspepsia 06/18/2019   Moderate persistent asthma with acute exacerbation 03/05/2019   Vitamin D  deficiency 07/03/2018   Depression 07/03/2018   Class 2 severe obesity with serious comorbidity and body mass index (BMI) of 37.0 to 37.9 in adult [facility]) 07/03/2018   Essential hypertension 09/19/2017   Aneurysm, cerebral, nonruptured 05/06/2015   White matter abnormality on MRI of brain 05/06/2015   Migraine headache without aura 05/06/2015   Dizziness 05/06/2015   Foreign body of ear, left 09/05/2012   HTN (hypertension) 06/12/2012   Allergic rhinitis due to pollen 12/27/2007   Allergic-infective asthma 08/29/2006   ECZEMA 08/29/2006   Past Medical History:  Diagnosis Date   Allergic rhinitis, cause unspecified    Asthma    Constipation    Eczema    Food allergy     Gallbladder problem    Headache    Hypertension    Joint pain    Vitamin D  deficiency    Past Surgical History:  Procedure Laterality Date   BRAIN SURGERY     CESAREAN SECTION Bilateral [id number]   Social History   Tobacco Use   Smoking status: Never    Passive exposure: Never   Smokeless tobacco: Never  Vaping Use   Vaping status: Never Used  Substance Use Topics   Alcohol use: Yes    Comment: ocassionally   Drug use: No   Social History   Socioeconomic History   Marital status: Single    Spouse name: Not on  file   Number of children: Not on file   Years of education: Not on file   Highest education level: Master's degree (e.g., MA, MS, MEng, MEd, MSW, MBA)  Occupational History   Occupation: Juvenile Counsler    Comment: guard   Tobacco Use   Smoking status: Never    Passive exposure: Never   Smokeless tobacco: Never  Vaping Use   Vaping status: Never Used  Substance and Sexual Activity   Alcohol use: Yes    Comment: ocassionally   Drug use: No   Sexual activity: Yes    Partners: Male  Other Topics Concern   Not on file   Social History Narrative   Exercise--no   Right handed   Caffiene1 cup   Lives alone and a two story home   Social Drivers of Health   Financial Resource Strain: Low Risk  (06/24/2023)   Overall Financial Resource Strain (CARDIA)    Difficulty of Paying Living Expenses: Not very hard  Food Insecurity: No Food Insecurity (06/24/2023)   Hunger Vital Sign    Worried About Running Out of Food in the Last Year: Never true    Ran Out of Food in the Last Year: Never true  Transportation Needs: No Transportation Needs (06/24/2023)   PRAPARE - Administrator, Civil Service (Medical): No    Lack of Transportation (Non-Medical): No  Physical Activity: Sufficiently Active (06/24/2023)   Exercise Vital Sign    Days of Exercise per Week: 3 days    Minutes of Exercise per Session: 60 min  Stress: No Stress Concern Present (06/24/2023)   [employer] of Occupational Health - Occupational Stress Questionnaire    Feeling of Stress : Only a little  Social Connections: Moderately Integrated (06/24/2023)   Social Connection and Isolation Panel    Frequency of Communication with Friends and Family: Twice a week    Frequency of Social Gatherings with Friends and Family: Twice a week    Attends Religious Services: More than 4 times per year    Active Member of [employer] or Organizations: Yes    Attends Engineer, structural: More than 4 times per year    Marital Status: Separated  Intimate Partner Violence: Not on file   Family Status  Relation Name Status   Mother  Alive   Father  Deceased   Sister  Alive   Sister  Alive   Brother  Alive  No partnership data on file   Family History  Problem Relation Age of Onset   Asthma Mother    Allergies Mother    Asthma Father    Drug abuse Father    Allergies Father    Allergies  Allergen Reactions   Other Anaphylaxis    Nuts   Chlorhexidine Gluconate Itching      Review of Systems  Constitutional:  Positive for chills and  fever. Negative for malaise/fatigue.  HENT:  Positive for congestion and sinus pain. Negative for ear discharge and ear pain.   Eyes:  Negative for blurred vision.  Respiratory:  Positive for cough. Negative for sputum production and shortness of breath.   Cardiovascular:  Negative for chest pain, palpitations and leg swelling.  Gastrointestinal:  Negative for abdominal pain, blood in stool and nausea.  Genitourinary:  Negative for dysuria and frequency.  Musculoskeletal:  Negative for falls.  Skin:  Negative for rash.  Neurological:  Negative for dizziness, loss of consciousness and headaches.  Endo/Heme/Allergies:  Negative for environmental allergies.  Psychiatric/Behavioral:  Negative for depression. The patient is not nervous/anxious.       Objective:     BP 110/80 (BP Location: Right Arm, Patient Position: Sitting, Cuff Size: Large)   Pulse 95   Temp 99.1 F (37.3 C) (Oral)   Resp 18   Ht 5' 5 (1.651 m)   Wt 201 lb 9.6 oz (91.4 kg)   LMP 09/19/2015   SpO2 96%   BMI 33.55 kg/m  BP Readings from Last 3 Encounters:  09/29/23 110/80  09/22/23 112/77  09/12/23 126/84   Wt Readings from Last 3 Encounters:  09/29/23 201 lb 9.6 oz (91.4 kg)  09/22/23 201 lb (91.2 kg)  09/12/23 204 lb (92.5 kg)   SpO2 Readings from Last 3 Encounters:  09/29/23 96%  09/22/23 99%  09/12/23 99%      Physical Exam Vitals and nursing note reviewed.  Constitutional:      General: She is not in acute distress.    Appearance: Normal appearance. She is well-developed.  HENT:     Head: Normocephalic and atraumatic.     Nose: Congestion and rhinorrhea present. Rhinorrhea is purulent.     Right Sinus: Maxillary sinus tenderness and frontal sinus tenderness present.     Left Sinus: Maxillary sinus tenderness and frontal sinus tenderness present.     Mouth/Throat:     Mouth: Mucous membranes are moist.     Pharynx: Posterior oropharyngeal erythema and postnasal drip present.  Eyes:      General: No scleral icterus.       Right eye: No discharge.        Left eye: No discharge.  Cardiovascular:     Rate and Rhythm: Normal rate and regular rhythm.     Heart sounds: No murmur heard. Pulmonary:     Effort: Pulmonary effort is normal. No respiratory distress.     Breath sounds: Normal breath sounds. No wheezing or rales.  Musculoskeletal:        General: Normal range of motion.     Cervical back: Normal range of motion and neck supple.     Right lower leg: No edema.     Left lower leg: No edema.  Skin:    General: Skin is warm and dry.  Neurological:     Mental Status: She is alert and oriented to person, place, and time.  Psychiatric:        Mood and Affect: Mood normal.        Behavior: Behavior normal.        Thought Content: Thought content normal.        Judgment: Judgment normal.      Results for orders placed or performed in visit on 09/29/23  POC COVID-19  Result Value Ref Range   SARS Coronavirus 2 Ag Negative Negative    Last CBC Lab Results  Component Value Date   WBC 4.2 09/12/2023   HGB 13.4 09/12/2023   HCT 41.9 09/12/2023   MCV 89.1 09/12/2023   MCH 28.5 09/12/2023   RDW 14.6 09/12/2023   PLT 204 09/12/2023   Last metabolic panel Lab Results  Component Value Date   GLUCOSE 87 09/12/2023   NA 140 09/12/2023   K 4.9 09/12/2023   CL 108 09/12/2023   CO2 23 09/12/2023   BUN 18 09/12/2023   CREATININE 0.87 09/12/2023   EGFR 76 09/12/2023   CALCIUM 8.9 09/12/2023   PROT 7.4 09/12/2023   ALBUMIN 4.2 04/06/2023   LABGLOB 2.9 10/05/2018  AGRATIO 1.5 10/05/2018   BILITOT 0.5 09/12/2023   ALKPHOS 144 (H) 04/06/2023   AST 24 09/12/2023   ALT 19 09/12/2023   ANIONGAP 11 02/16/2022   Last lipids Lab Results  Component Value Date   CHOL 177 02/22/2023   HDL 66 02/22/2023   LDLCALC 97 02/22/2023   TRIG 73 02/22/2023   CHOLHDL 2 01/19/2022   Last hemoglobin A1c Lab Results  Component Value Date   HGBA1C 5.8 (H) 02/22/2023    Last thyroid  functions Lab Results  Component Value Date   TSH 1.310 02/22/2023   T3TOTAL 105 04/13/2018   Last vitamin D  Lab Results  Component Value Date   VD25OH 53 02/16/2023   Last vitamin B12 and Folate Lab Results  Component Value Date   VITAMINB12 744 02/22/2023   FOLATE 13.9 02/22/2023      The 10-year ASCVD risk score (Arnett DK, et al., 2019) is: 3.5%    Assessment & Plan:   Problem List Items Addressed This Visit   None Visit Diagnoses       Generalized body aches    -  Primary   Relevant Orders   POC COVID-19 (Completed)     Acute non-recurrent frontal sinusitis       Relevant Medications   amoxicillin -clavulanate (AUGMENTIN ) 875-125 MG tablet   promethazine-dextromethorphan (PROMETHAZINE-DM) 6.25-15 MG/5ML syrup   fluconazole (DIFLUCAN) 150 MG tablet     Acute vaginitis       Relevant Medications   fluconazole (DIFLUCAN) 150 MG tablet     Assessment and Plan Assessment & Plan Sinusitis   She presents with symptoms of sinusitis, including congestion, postnasal drip, sinus pressure, and a low-grade fever of 100.13F. The cough is likely due to postnasal drip. She experiences similar symptoms seasonally. Lungs are clear, and there is no sore throat, only slight scratchiness. COVID-19 is ruled out. Prescribe Augmentin  for the sinus infection. Discuss the potential for yeast infections with antibiotic use and prescribe Diflucan as a precaution, to be taken at the first sign of a yeast infection, with a repeat dose after three days if needed. Advise continuing or switching antihistamines if Zyrtec  is ineffective, suggesting alternatives like Xyzal or Chlortrimeton. Recommend obtaining Tylenol  for fever management.    Return if symptoms worsen or fail to improve.    Madesyn Ast R Lowne Chase, DO

## 2023-10-03 ENCOUNTER — Other Ambulatory Visit: Payer: Self-pay | Admitting: Family Medicine

## 2023-10-03 ENCOUNTER — Encounter: Payer: Self-pay | Admitting: Family Medicine

## 2023-10-03 DIAGNOSIS — J0110 Acute frontal sinusitis, unspecified: Secondary | ICD-10-CM

## 2023-10-03 MED ORDER — PREDNISONE 10 MG PO TABS: ORAL_TABLET | ORAL | 0 refills | Status: DC

## 2023-10-20 ENCOUNTER — Encounter (INDEPENDENT_AMBULATORY_CARE_PROVIDER_SITE_OTHER): Payer: Self-pay | Admitting: Family Medicine

## 2023-10-20 ENCOUNTER — Ambulatory Visit (INDEPENDENT_AMBULATORY_CARE_PROVIDER_SITE_OTHER): Admitting: Family Medicine

## 2023-10-20 VITALS — BP 117/83 | HR 91 | Temp 98.1°F | Ht 65.0 in | Wt 197.0 lb

## 2023-10-20 DIAGNOSIS — J329 Chronic sinusitis, unspecified: Secondary | ICD-10-CM | POA: Diagnosis not present

## 2023-10-20 DIAGNOSIS — E669 Obesity, unspecified: Secondary | ICD-10-CM

## 2023-10-20 DIAGNOSIS — R7303 Prediabetes: Secondary | ICD-10-CM | POA: Diagnosis not present

## 2023-10-20 DIAGNOSIS — Z6833 Body mass index (BMI) 33.0-33.9, adult: Secondary | ICD-10-CM

## 2023-10-20 DIAGNOSIS — B9689 Other specified bacterial agents as the cause of diseases classified elsewhere: Secondary | ICD-10-CM

## 2023-10-20 DIAGNOSIS — Z6832 Body mass index (BMI) 32.0-32.9, adult: Secondary | ICD-10-CM

## 2023-10-20 MED ORDER — METFORMIN HCL 500 MG PO TABS: 500.0000 mg | ORAL_TABLET | Freq: Every day | ORAL | 0 refills | Status: DC

## 2023-10-20 NOTE — Progress Notes (Signed)
 Office: [phone]  /  Fax: [phone]  WEIGHT SUMMARY AND BIOMETRICS  Anthropometric Measurements Height: 5' 5 (1.651 m) Weight: 197 lb (89.4 kg) BMI (Calculated): 32.78 Weight at Last Visit: 201 lb Weight Lost Since Last Visit: 4 lb Weight Gained Since Last Visit: 0 Starting Weight: 221 lb Total Weight Loss (lbs): 24 lb (10.9 kg) Peak Weight: 230 lb   Body Composition  Body Fat %: 35.3 % Fat Mass (lbs): 69.6 lbs Muscle Mass (lbs): 121.2 lbs Total Body Water (lbs): 78.4 lbs Visceral Fat Rating : 10   Other Clinical Data Fasting: no Labs: no Today's Visit #: 9 Starting Date: 02/22/23    Chief Complaint: OBESITY     History of Present Illness Ruth [name] is a 63 year old female who presents for obesity treatment plan assessment and progress evaluation.  She has not adhered to her eating plan recently, particularly during a vacation, but has resumed her routine. She engages in physical activities such as walking and line dancing for an average of 45 minutes, three times a week. Despite not eating much, she has lost four pounds since her last visit a month ago.  She started taking metformin  last Wednesday after returning from vacation. She takes it with breakfast, although sometimes later than she should. She experiences mild gastrointestinal side effects, including gassiness and occasional nausea, but no diarrhea. She ensures to eat before taking the medication to minimize GI issues.  She experienced a sinus infection recently, for which she was prescribed antibiotics, cough medicine, Diflucan , and prednisone  due to persistent congestion and an upcoming flight. She takes Flonase  and Zyrtec  daily for sinus issues and had a low-grade fever during the infection. A COVID test was performed to rule out other causes.  She is exploring different eating plans, including a pescatarian diet, but finds it more work. She is familiar with a stage two eating plan but finds  it boring. She is working on ensuring adequate nutrition intake, especially with the appetite suppression from metformin , and is considering journaling her food intake to better manage her diet.      PHYSICAL EXAM:  Blood pressure 117/83, pulse 91, temperature 98.1 F (36.7 C), height 5' 5 (1.651 m), weight 197 lb (89.4 kg), last menstrual period 09/19/2015, SpO2 99%. Body mass index is 32.78 kg/m.  DIAGNOSTIC DATA REVIEWED:  BMET    Component Value Date/Time   NA 140 09/12/2023 0810   NA 144 10/05/2018 1004   K 4.9 09/12/2023 0810   CL 108 09/12/2023 0810   CO2 23 09/12/2023 0810   GLUCOSE 87 09/12/2023 0810   BUN 18 09/12/2023 0810   BUN 16 10/05/2018 1004   CREATININE 0.87 09/12/2023 0810   CALCIUM 8.9 09/12/2023 0810   GFRNONAA >60 02/16/2022 1350   GFRAA >60 07/21/2019 0937   Lab Results  Component Value Date   HGBA1C 5.8 (H) 02/22/2023   HGBA1C 5.7 (H) 07/21/2017   Lab Results  Component Value Date   INSULIN  8.4 02/22/2023   INSULIN  15.0 07/21/2017   Lab Results  Component Value Date   TSH 1.310 02/22/2023   CBC    Component Value Date/Time   WBC 4.2 09/12/2023 0810   RBC 4.70 09/12/2023 0810   HGB 13.4 09/12/2023 0810   HGB 13.1 02/16/2022 1350   HGB 14.2 04/13/2018 1001   HCT 41.9 09/12/2023 0810   HCT 43.1 04/13/2018 1001   PLT 204 09/12/2023 0810   PLT 240 02/16/2022 1350   PLT 235  04/13/2018 1001   MCV 89.1 09/12/2023 0810   MCV 85 04/13/2018 1001   MCH 28.5 09/12/2023 0810   MCHC 32.0 09/12/2023 0810   RDW 14.6 09/12/2023 0810   RDW 13.6 04/13/2018 1001   Iron Studies No results found for: IRON, TIBC, FERRITIN, IRONPCTSAT Lipid Panel     Component Value Date/Time   CHOL 177 02/22/2023 0944   TRIG 73 02/22/2023 0944   HDL 66 02/22/2023 0944   CHOLHDL 2 01/19/2022 1402   VLDL 12.0 01/19/2022 1402   LDLCALC 97 02/22/2023 0944   LDLCALC 123 (H) 01/04/2020 1019   Hepatic Function Panel     Component Value Date/Time   PROT  7.4 09/12/2023 0810   PROT 7.2 10/05/2018 1004   ALBUMIN 4.2 04/06/2023 1332   ALBUMIN 4.3 10/05/2018 1004   AST 24 09/12/2023 0810   AST 36 02/16/2022 1350   ALT 19 09/12/2023 0810   ALT 28 02/16/2022 1350   ALKPHOS 144 (H) 04/06/2023 1332   BILITOT 0.5 09/12/2023 0810   BILITOT 0.8 02/16/2022 1350   BILIDIR 0.1 02/23/2021 0927   IBILI 0.3 08/24/2013 1633      Component Value Date/Time   TSH 1.310 02/22/2023 0944   Nutritional Lab Results  Component Value Date   VD25OH 53 02/16/2023   VD25OH 69 07/22/2022   VD25OH 29.88 (L) 03/31/2021     Assessment and Plan Assessment & Plan Obesity She has lost four pounds since her last visit a month ago, despite not adhering to her eating plan. She engages in physical activities such as walking and line dancing for 45 minutes, three times a week. She is considering journaling to track her food intake. Emphasized the importance of food as medicine and encouraged strategies to ensure adequate nutrition intake. Discussed the need to maintain a caloric intake of 1200-1400 calories per day with at least 90 grams of protein to prevent muscle breakdown and metabolic slowdown. Explained the benefits of real food protein over supplements, noting that real food protein requires more energy to digest, thus reducing net caloric intake. - Continue current physical activities (walking and line dancing) - Consider journaling to track food intake using My Fitness Pal or Lose It app - Ensure consistent adherence to an eating plan - Aim for 1200-1400 calories per day with at least 90 grams of protein - Consider a breakfast smoothie with Fairlife milk, [place] yogurt, and strawberries for protein intake  Prediabetes Started metformin  last Wednesday and reports mild side effects such as gassiness and occasional nausea, but no diarrhea. Explained that 75% of people do not experience significant side effects and that mild symptoms usually settle down over time.  Advised to eat before taking metformin  to minimize gastrointestinal issues. Discussed the role of metformin  in reducing hunger and the importance of maintaining a balanced diet to manage prediabetes. - Continue metformin  with breakfast - Ensure to eat before taking metformin  to minimize GI issues  Sinus Infection Recently had a sinus infection and was treated with antibiotics, cough medicine, Diflucan , and prednisone . Experienced significant congestion and was concerned about flying with it. Advised using Flonase  and Astelin  regularly and increasing the frequency during flare-ups. Takes Zyrtec  daily and had a COVID test to rule out other causes of her symptoms. Discussed the potential side effects of prednisone , including increased hunger, irritability, and sleep disturbances, and advised to avoid it when possible. - Use Flonase  and Astelin  regularly, increase to twice daily during flare-ups - Continue daily Zyrtec  - Consider avoiding prednisone   when possible due to potential side effects      She was informed of the importance of frequent follow up visits to maximize her success with intensive lifestyle modifications for her multiple health conditions.    Louann Penton, MD

## 2023-11-07 ENCOUNTER — Ambulatory Visit

## 2023-11-07 ENCOUNTER — Other Ambulatory Visit: Payer: Self-pay

## 2023-11-07 VITALS — BP 102/69 | HR 72 | Temp 98.1°F | Resp 18 | Ht 64.0 in | Wt 199.6 lb

## 2023-11-07 DIAGNOSIS — D869 Sarcoidosis, unspecified: Secondary | ICD-10-CM

## 2023-11-07 MED ORDER — SODIUM CHLORIDE 0.9 % IV SOLN
3.0000 mg/kg | Freq: Once | INTRAVENOUS | Status: AC
  Administered 2023-11-07 (×2): 300 mg via INTRAVENOUS
  Filled 2023-11-07: qty 30

## 2023-11-07 MED ORDER — METHYLPREDNISOLONE SODIUM SUCC 40 MG IJ SOLR
40.0000 mg | Freq: Once | INTRAMUSCULAR | Status: AC
  Administered 2023-11-07 (×2): 40 mg via INTRAVENOUS
  Filled 2023-11-07: qty 1

## 2023-11-07 MED ORDER — ACETAMINOPHEN 325 MG PO TABS
650.0000 mg | ORAL_TABLET | Freq: Once | ORAL | Status: AC
  Administered 2023-11-07 (×2): 650 mg via ORAL
  Filled 2023-11-07: qty 2

## 2023-11-07 MED ORDER — DIPHENHYDRAMINE HCL 25 MG PO CAPS
25.0000 mg | ORAL_CAPSULE | Freq: Once | ORAL | Status: AC
  Administered 2023-11-07 (×2): 25 mg via ORAL
  Filled 2023-11-07: qty 1

## 2023-11-07 NOTE — Progress Notes (Signed)
 Diagnosis: Sarcoidosis  Provider:  Mannam, Praveen MD  Procedure: IV Infusion  IV Type: Peripheral, IV Location: R Antecubital  Avsola  (infliximab -axxq), Dose: 300 mg  Infusion Start Time: 0857  Infusion Stop Time: 1105  Post Infusion IV Care: Peripheral IV Discontinued  Discharge: Condition: Good, Destination: Home . AVS Declined  Performed by:  Rocky FORBES Sar, RN

## 2023-11-08 LAB — COMPREHENSIVE METABOLIC PANEL WITH GFR
AG Ratio: 1.3 (calc) (ref 1.0–2.5)
ALT: 15 U/L (ref 6–29)
AST: 19 U/L (ref 10–35)
Albumin: 4 g/dL (ref 3.6–5.1)
Alkaline phosphatase (APISO): 136 U/L (ref 37–153)
BUN: 12 mg/dL (ref 7–25)
CO2: 28 mmol/L (ref 20–32)
Calcium: 9.2 mg/dL (ref 8.6–10.4)
Chloride: 106 mmol/L (ref 98–110)
Creat: 0.85 mg/dL (ref 0.50–1.05)
Globulin: 3.2 g/dL (ref 1.9–3.7)
Glucose, Bld: 86 mg/dL (ref 65–99)
Potassium: 3.7 mmol/L (ref 3.5–5.3)
Sodium: 139 mmol/L (ref 135–146)
Total Bilirubin: 0.4 mg/dL (ref 0.2–1.2)
Total Protein: 7.2 g/dL (ref 6.1–8.1)
eGFR: 78 mL/min/1.73m2 (ref >=60)

## 2023-11-08 LAB — CBC WITH DIFFERENTIAL/PLATELET
Absolute Lymphocytes: 1077 {cells}/uL (ref 850–3900)
Absolute Monocytes: 311 {cells}/uL (ref 200–950)
Basophils Absolute: 41 {cells}/uL (ref 0–200)
Basophils Relative: 1.1 %
Eosinophils Absolute: 189 {cells}/uL (ref 15–500)
Eosinophils Relative: 5.1 %
HCT: 41.9 % (ref 35.0–45.0)
Hemoglobin: 13 g/dL (ref 11.7–15.5)
MCH: 27.8 pg (ref 27.0–33.0)
MCHC: 31 g/dL — ABNORMAL LOW (ref 32.0–36.0)
MCV: 89.5 fL (ref 80.0–100.0)
MPV: 10.8 fL (ref 7.5–12.5)
Monocytes Relative: 8.4 %
Neutro Abs: 2083 {cells}/uL (ref 1500–7800)
Neutrophils Relative %: 56.3 %
Platelets: 223 Thousand/uL (ref 140–400)
RBC: 4.68 Million/uL (ref 3.80–5.10)
RDW: 13.7 % (ref 11.0–15.0)
Total Lymphocyte: 29.1 %
WBC: 3.7 Thousand/uL — ABNORMAL LOW (ref 3.8–10.8)

## 2023-11-23 ENCOUNTER — Ambulatory Visit (INDEPENDENT_AMBULATORY_CARE_PROVIDER_SITE_OTHER): Admitting: Family Medicine

## 2023-11-25 NOTE — Progress Notes (Signed)
 Office Visit Note  Patient: Ruth [name]             Date of Birth: 1961/02/25           MRN: [id number]             PCP: Antonio Cyndee Jamee JONELLE, DO Referring: Antonio Cyndee Jamee JONELLE, * Visit Date: 12/09/2023   Subjective:  Other (Patient states her eyes are blurry every now and then. )   Discussed the use of AI scribe software for clinical note transcription with the patient, who gave verbal consent to proceed.  History of Present Illness   Ruth [name] is a 63 y.o. female here for follow up for sarcoidosis with bulky cervical lymphadenopathy now on Avsola  infusions 3 mg/kg q8wks started January and azathioprine  50 mg daily.     She experiences intermittent blurriness in her vision, affecting both near and far sight, without associated pain or discomfort. An eye doctor previously noted a small cataract in one eye months ago, but no concern was expressed at that time.  She has not experienced any joint swelling or new rashes, although she has eczema causing dryness on her neck. She maintains the ability to work out three times a week without significant pain.  Recently, she experienced upper respiratory congestion, which she attributes to allergies. She has not been on antibiotics for this issue. Her recent lab work from three weeks ago included a slightly low white blood cell count, with a neutrophil count that was not low.       Previous HPI 08/26/2023 Ruth [name] is a 63 y.o. female here for follow up for sarcoidosis with bulky cervical lymphadenopathy now on Avsola  infusions 3 mg/kg q8wks started January 9th and azathioprine  50 mg daily.     She has been undergoing treatment for sarcoidosis and reports an overall improvement in her condition. Her ACE levels, which were previously elevated, have decreased to 80 as of February. Recent lab tests did not include ACE levels but showed a decrease in white blood cell count to 3.3, which she attributes to medication side  effects. She is currently on low-dose azathioprine  and Epsolay.   She experiences minor joint pain, particularly in the mornings, but overall feels better. Swelling has improved, and she has been losing weight, which she attributes to avoiding steroids. She has not had any recent illnesses requiring antibiotics but has ongoing allergy  issues for which she takes Flonase , another nasal spray, and loratadine  10 mg daily.   She describes an episode of bruising from an infusion attempt in her left arm, which has resolved, and she prefers using the other arm for infusions. She feels more mobile since starting infusions, although she occasionally experiences stiffness. She has taken ibuprofen  twice in the past week for body aches.   No recent swelling in her feet or ankles. She mentions persistent pain in her thumb, which she attributes to frequent use, such as texting. She feels the pain even when not using her thumb, particularly in the area near the base of the thumb.         Previous HPI 05/20/2023 Ruth [name] is a 63 y.o. female here for follow up for sarcoidosis with bulky cervical lymphadenopathy now on Avsola  infusions 3 mg/kg started January 9th and azathioprine  100 mg daily.     She is currently undergoing treatment for sarcoidosis with Evzola infusions, which began in January. She is in the loading stage, receiving infusions every four to  eight weeks. Her ACE levels, previously elevated due to sarcoidosis, decreased slightly with steroid use but increased again when steroids were reduced. She takes Imuran  to prevent antibody formation against the infusion medication, experiencing constipation as a side effect. No significant side effects from the infusions are noted, except for initial sluggishness that resolves by the next day.   She experiences joint pain, particularly in her hands, which is less severe than during her last visit. Morning stiffness improves throughout the day. No radiating  pain or pain in her feet and ankles is reported.   No new rashes or swelling, except for a recurring rash on her buttocks, which she associates with sarcoidosis.   No recent illnesses except for sinus issues and post-nasal drip. Overall, she feels better with less joint pain and increased energy levels. She also made a recent switch from a prescription vitamin D  to over-the-counter vitamin D3.        Previous HPI 02/16/2023 Ruth [name] is a 63 y.o. female here for follow up for sarcoidosis with bulky cervical lymphadenopathy now on azathioprine  100 mg daily.  She tapered off of prednisone  completely since our last visit.  Since stopping the medication noticed increased in joint stiffness and achiness in multiple areas.  Not with any visible swelling.  Seems worse first thing in the morning and getting up after she sits for more than a few minutes at a time.  She is noticing nausea and decreased appetite from the azathioprine  also noticed a darker color to her urine.  No new skin rashes.  Still has ongoing lymph node swelling but decreased size from before.   Previous HPI 12/16/2022 Ruth [name] is a 63 y.o. female here for follow up for sarcoidosis with bulky cervical lymphadenopathy on prednisone  10 mg daily.  She developed severe side effects from methotrexate  after last visit and stopped taking the medication.  She was experiencing dizziness and severe fatigue lasting for 3 to 4 days at a time after each dose.  When she stopped the methotrexate  after a week she felt back to her normal state.  We discussed starting azathioprine  as an alternate steroid sparing DMARD but she did not begin this with concerns about the medication after her bad experience of methotrexate .  Has been taking the prednisone  10 mg daily and feels that the large swollen nodules in her neck have continued to improve.  No new chest pain shortness of breath or coughing.  She does have a large amount of unintentional weight  gain from the prolonged steroids estimates about 37 pounds.   Previous HPI 10/07/2022 Ruth [name] is a 63 y.o. female here for follow up for sarcoidosis with bulky cervical lymphadenopathy now on methotrexate  15 mg p.o. weekly folic acid  1 mg daily and tapered down off prednisone  after last visit.  She is only started the methotrexate  in the past 2 weeks was initially delaying this as she was at the hospital supporting her daughter who was admitted with major pregnancy complications.  X-ray obtained did not suggest any mediastinal lymphadenopathy.  Symptoms overall pretty stable still with bulky swallowing throughout the neck but not particularly painful.   Previous HPI 08/31/22 BRIGID VANDEKAMP is a 63 y.o. female here for follow up for sarcoidosis with fatigue and bulky lymphadenopathy now on prednisone  20 mg daily.  She has seen improvement of the large lymph node swelling.  Has noticed several side effects with unintentional weight gain, blurry vision, and cramping in her calfs and  toes.  She has been trying to moderate her diet but definitely still increased appetite and becoming very tired shortly after eating.  Joint pain and stiffness including low back pain is doing great on the steroids.   Previous HPI 07/22/22 MONIFAH FREEHLING is a 63 y.o. female here for evaluation of possible sarcoidosis with cervical lymphadenopathy evaluated with excisional biopsy.  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  Lymph node swelling started in July of last year she did not recall any specific preceding major illness or medical event.  Her initial visit with PCP labs did have positive IgM and IgG for EBV but without known sick contact for infectious mononucleosis.  She was initially treated with Keflex  and saw temporary improvement in symptoms but swelling has returned and been present  continuously for at least the past 8 months.  Has been associated with some fatigue and had some tooth pain otherwise does not recall major change in symptoms otherwise.  Due to persistent lymphadenopathy she had CT imaging of the neck concerning for lymphoma and saw Dr. Timmy with oncology.  Lab testing at the time showed a low positive ANA and she has a persistent elevated alkaline phosphatase attributed to gallbladder disease with multiple stones but minimal symptoms.  Subsequently went for cervical lymph node biopsy with ENT Dr. Carlie this was negative for malignancy and suspicious for sarcoidosis or other reactive or autoimmune inflammatory process. She has chronic persistent moderate asthma on inhaler treatments and intermittently requires systemic steroids last treatment in March due to exacerbation with 1 week of prednisone .  But has not noticed cough dyspnea chest pain or other respiratory complaints outside of the usual for her asthma and allergies. She has a long history of eczema this was severe as a child with mild persistent symptoms still ongoing.  There are a few areas of permanent residual hypopigmentation.  She had a few skin colored raised nodules along the corners of the mouth and on her back evaluated by dermatology last year with biopsy showing some interstitial granulomatous process. She has chronic joint pain and stiffness most often her legs with trouble from hip and knee pain that is worse trying to get up from the floor.  Occasionally gets pain in her hands and feet but without visible swelling.  She does not take any daily medication for joint pains. She denies any new visual changes eye pain or inflammation.  Does have chronically dry eyes but thinks this may be related to her allergies or antihistamines.  No new hair loss, no photosensitivity, no Raynaud's symptoms.   02/05/22 CT Neck IMPRESSION: Widespread lymphadenopathy throughout all cervical nodal stations, symmetric from  right to left. This includes enlargement of intraparotid lymph nodes. No single dominant node or necrotic node. Bilateral axillary lymphadenopathy also present. 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 Possible reactive nodal enlargement to some sort of systemic infection or autoimmune disease is also possible.   Review of Systems  Constitutional:  Negative for fatigue.  HENT:  Negative for mouth sores and mouth dryness.   Eyes:  Negative for dryness.  Respiratory:  Negative for shortness of breath.   Cardiovascular:  Negative for chest pain and palpitations.  Gastrointestinal:  Negative for blood in stool, constipation and diarrhea.  Endocrine: Negative for increased urination.  Genitourinary:  Negative for involuntary urination.  Musculoskeletal:  Positive for joint pain, joint pain, myalgias and myalgias. Negative for gait  problem, joint swelling, muscle weakness, morning stiffness and muscle tenderness.  Skin:  Negative for color change, rash, hair loss and sensitivity to sunlight.  Allergic/Immunologic: Negative for susceptible to infections.  Neurological:  Negative for dizziness and headaches.  Hematological:  Negative for swollen glands.  Psychiatric/Behavioral:  Positive for sleep disturbance. Negative for depressed mood. The patient is not nervous/anxious.     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   Preventative health care  07/07/2020   SAH (subarachnoid hemorrhage) (HCC) 07/21/2019   RUQ pain 06/18/2019   Dyspepsia 06/18/2019   Moderate persistent asthma with acute exacerbation 03/05/2019   Vitamin D  deficiency 07/03/2018   Depression 07/03/2018   Class 2 severe obesity with serious comorbidity and body mass index (BMI) of 37.0 to 37.9 in adult 07/03/2018   Essential hypertension 09/19/2017   Aneurysm, cerebral, nonruptured 05/06/2015   White matter abnormality on MRI of brain 05/06/2015   Migraine headache without aura 05/06/2015   Dizziness 05/06/2015   Foreign body of ear, left 09/05/2012   HTN (hypertension) 06/12/2012   Allergic rhinitis due to pollen 12/27/2007   Allergic-infective asthma 08/29/2006   ECZEMA 08/29/2006    Past Medical History:  Diagnosis Date   Allergic rhinitis, cause unspecified    Asthma    Constipation    Eczema    Food allergy     Gallbladder problem    Headache    Hypertension    Joint pain    Vitamin D  deficiency     Family History  Problem Relation Age of Onset   Asthma Mother    Allergies Mother    Asthma Father    Drug abuse Father    Allergies Father    Past Surgical History:  Procedure Laterality Date   BRAIN SURGERY     CESAREAN SECTION Bilateral [id number]   Social History   Social History Narrative   Exercise--no   Right handed   Caffiene1 cup   Lives alone and a two story home   Immunization History  Administered Date(s) Administered   PFIZER(Purple Top)SARS-COV-2 Vaccination 10/19/2019, 11/09/2019   Td 05/27/2012     Objective: Vital Signs: BP 130/74   Pulse 72   Temp 97.6 F (36.4 C)   Resp 14   Ht 5' 4 (1.626 m)   Wt 199 lb (90.3 kg)   LMP 09/19/2015   BMI 34.16 kg/m    Physical Exam Eyes:     Conjunctiva/sclera: Conjunctivae normal.  Cardiovascular:     Rate and Rhythm: Normal rate and regular rhythm.  Pulmonary:     Effort: Pulmonary effort is normal.     Breath sounds: Normal breath sounds.  Lymphadenopathy:      Cervical: Cervical adenopathy present.  Skin:    General: Skin is warm and dry.  Neurological:     Mental Status: She is alert.  Psychiatric:        Mood and Affect: Mood normal.      Musculoskeletal Exam:  Shoulders full  ROM no tenderness or swelling Elbows full ROM no tenderness or swelling Wrists full ROM no tenderness or swelling Fingers full ROM no tenderness or swelling Knees full ROM no tenderness or swelling Ankles full ROM no tenderness or swelling  Investigation: No additional findings.  Imaging: No results found.  Recent Labs: Lab Results  Component Value Date   WBC 3.7 (L) 11/07/2023   HGB 13.0 11/07/2023   PLT 223 11/07/2023   NA 143 12/21/2023   K 4.1 12/21/2023   CL 106 12/21/2023   CO2 21 12/21/2023   GLUCOSE 85 12/21/2023   BUN 13 12/21/2023   CREATININE 0.88 12/21/2023   BILITOT 0.3 12/21/2023   ALKPHOS 182 (H) 12/21/2023   AST 23 12/21/2023   ALT 18 12/21/2023   PROT 7.3 12/21/2023   ALBUMIN 4.1 12/21/2023   CALCIUM 9.4 12/21/2023   GFRAA >60 07/21/2019   QFTBGOLDPLUS NEGATIVE 03/21/2023    Speciality Comments: No specialty comments available.  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 Differential includes posterior uveitis or cataract progression. Current treatment includes Epsom infusions and Imuran . Steroid premedication may contribute to cataract development. Condition well-managed with good exercise tolerance. - Refer to Dr. Maree at [facility] for ophthalmology evaluation. - Continue Avsola  infusions 3 mg/kg IV q8wks and Imuran . - Consider increasing infusion amount if ocular disease activity is confirmed.   High risk medication use - Reduce Imuran  to 50 mg daily.  Avsola  infusions currently in loading dose  series at 3 mg/kg, with potential for dose adjustment based on response CBC and CMP reviewed from last month mild leukopenia at 3.7. No serious interval infections.    Orders: Orders Placed This Encounter  Procedures   Ambulatory referral to Ophthalmology   No orders of the defined types were placed in this encounter.    Follow-Up Instructions: Return in about 3 months (around 03/09/2024) for Sarcoidosis on INF/AZA f/u 3mos.   Lonni LELON Ester, MD  Note - This record has been created using [employer].  Chart creation errors have been sought, but may not always  have been located. Such creation errors do not reflect on  the standard of medical care.

## 2023-12-09 ENCOUNTER — Ambulatory Visit: Attending: Internal Medicine | Admitting: Internal Medicine

## 2023-12-09 ENCOUNTER — Encounter: Payer: Self-pay | Admitting: Internal Medicine

## 2023-12-09 VITALS — BP 130/74 | HR 72 | Temp 97.6°F | Resp 14 | Ht 64.0 in | Wt 199.0 lb

## 2023-12-09 DIAGNOSIS — D869 Sarcoidosis, unspecified: Secondary | ICD-10-CM | POA: Diagnosis not present

## 2023-12-09 DIAGNOSIS — Z79899 Other long term (current) drug therapy: Secondary | ICD-10-CM | POA: Diagnosis not present

## 2023-12-09 NOTE — Patient Instructions (Signed)
 I recommend getting an evaluation with Dr. Paticia Fairly with [facility] ophthalmology if possible. I can send a referral for you if needed.

## 2023-12-17 ENCOUNTER — Other Ambulatory Visit (INDEPENDENT_AMBULATORY_CARE_PROVIDER_SITE_OTHER): Payer: Self-pay | Admitting: Family Medicine

## 2023-12-17 DIAGNOSIS — R7303 Prediabetes: Secondary | ICD-10-CM

## 2023-12-21 ENCOUNTER — Encounter (INDEPENDENT_AMBULATORY_CARE_PROVIDER_SITE_OTHER): Payer: Self-pay | Admitting: Family Medicine

## 2023-12-21 ENCOUNTER — Ambulatory Visit (INDEPENDENT_AMBULATORY_CARE_PROVIDER_SITE_OTHER): Admitting: Family Medicine

## 2023-12-21 VITALS — BP 120/84 | HR 77 | Temp 98.2°F | Ht 64.0 in | Wt 195.0 lb

## 2023-12-21 DIAGNOSIS — I10 Essential (primary) hypertension: Secondary | ICD-10-CM

## 2023-12-21 DIAGNOSIS — E559 Vitamin D deficiency, unspecified: Secondary | ICD-10-CM

## 2023-12-21 DIAGNOSIS — E785 Hyperlipidemia, unspecified: Secondary | ICD-10-CM

## 2023-12-21 DIAGNOSIS — R7303 Prediabetes: Secondary | ICD-10-CM | POA: Diagnosis not present

## 2023-12-21 DIAGNOSIS — Z6833 Body mass index (BMI) 33.0-33.9, adult: Secondary | ICD-10-CM

## 2023-12-21 DIAGNOSIS — E669 Obesity, unspecified: Secondary | ICD-10-CM

## 2023-12-21 DIAGNOSIS — E782 Mixed hyperlipidemia: Secondary | ICD-10-CM

## 2023-12-21 MED ORDER — METFORMIN HCL 500 MG PO TABS: 500.0000 mg | ORAL_TABLET | Freq: Every day | ORAL | 0 refills | Status: DC

## 2023-12-21 NOTE — Progress Notes (Signed)
 Office: [phone]  /  Fax: [phone]  WEIGHT SUMMARY AND BIOMETRICS  Anthropometric Measurements Height: 5' 4 (1.626 m) Weight: 195 lb (88.5 kg) BMI (Calculated): 33.46 Weight at Last Visit: 197 lb Weight Lost Since Last Visit: 2 lb Weight Gained Since Last Visit: 0 Starting Weight: 221 lb Total Weight Loss (lbs): 26 lb (11.8 kg) Peak Weight: 230 lb   Body Composition  Body Fat %: 41.3 % Fat Mass (lbs): 80.6 lbs Muscle Mass (lbs): 108.8 lbs Total Body Water (lbs): 74.4 lbs Visceral Fat Rating : 11   Other Clinical Data Fasting: yes Labs: no Today's Visit #: 10 Starting Date: 02/22/23    Chief Complaint: OBESITY    History of Present Illness Ruth [name] is a 63 year old female with obesity who presents for a follow-up on her treatment plan and progress.  She is following a journaling plan with a caloric intake of 1200 to 1400 calories and 90 or more grams of protein, achieving this goal about 50% of the time. She is working on increasing her intake of fruits and vegetables, meeting protein goals, and hydrating adequately. However, she occasionally skips meals and does not consistently achieve 7 to 9 hours of sleep per night.  She exercises for 60 minutes three days a week, incorporating both weights and cardio. She has started strength weight training, which she finds 'very addictive' and notes that her body is changing, with clothes fitting differently, indicating a loss of inches despite minimal weight change. She has set a goal to be in the gym three times a week since September 1st and has adhered to this plan.  She has lost 2 pounds in the last two months. She is on amlodipine  5 mg for hypertension and is also managing her prediabetes with diet and exercise. She stopped taking metformin  about two weeks ago due to constipation.  She is participating in a challenge that emphasizes discipline in various aspects of life, including food and exercise. She is  mindful of her eating habits, often planning her meals in advance and making healthier choices when dining out. She reports a recent experience where she limited herself to half a piece of bread at a restaurant and felt satisfied.  She takes a multivitamin and is aware of her vitamin D  deficiency, which she usually struggles with in the winter.      PHYSICAL EXAM:  Blood pressure 120/84, pulse 77, temperature 98.2 F (36.8 C), height 5' 4 (1.626 m), weight 195 lb (88.5 kg), last menstrual period 09/19/2015, SpO2 98%. Body mass index is 33.47 kg/m.  DIAGNOSTIC DATA REVIEWED:  BMET    Component Value Date/Time   NA 139 11/07/2023 0815   NA 144 10/05/2018 1004   K 3.7 11/07/2023 0815   CL 106 11/07/2023 0815   CO2 28 11/07/2023 0815   GLUCOSE 86 11/07/2023 0815   BUN 12 11/07/2023 0815   BUN 16 10/05/2018 1004   CREATININE 0.85 11/07/2023 0815   CALCIUM 9.2 11/07/2023 0815   GFRNONAA >60 02/16/2022 1350   GFRAA >60 07/21/2019 0937   Lab Results  Component Value Date   HGBA1C 5.8 (H) 02/22/2023   HGBA1C 5.7 (H) 07/21/2017   Lab Results  Component Value Date   INSULIN  8.4 02/22/2023   INSULIN  15.0 07/21/2017   Lab Results  Component Value Date   TSH 1.310 02/22/2023   CBC    Component Value Date/Time   WBC 3.7 (L) 11/07/2023 0815   RBC 4.68 11/07/2023 0815  HGB 13.0 11/07/2023 0815   HGB 13.1 02/16/2022 1350   HGB 14.2 04/13/2018 1001   HCT 41.9 11/07/2023 0815   HCT 43.1 04/13/2018 1001   PLT 223 11/07/2023 0815   PLT 240 02/16/2022 1350   PLT 235 04/13/2018 1001   MCV 89.5 11/07/2023 0815   MCV 85 04/13/2018 1001   MCH 27.8 11/07/2023 0815   MCHC 31.0 (L) 11/07/2023 0815   RDW 13.7 11/07/2023 0815   RDW 13.6 04/13/2018 1001   Iron Studies No results found for: IRON, TIBC, FERRITIN, IRONPCTSAT Lipid Panel     Component Value Date/Time   CHOL 177 02/22/2023 0944   TRIG 73 02/22/2023 0944   HDL 66 02/22/2023 0944   CHOLHDL 2 01/19/2022 1402    VLDL 12.0 01/19/2022 1402   LDLCALC 97 02/22/2023 0944   LDLCALC 123 (H) 01/04/2020 1019   Hepatic Function Panel     Component Value Date/Time   PROT 7.2 11/07/2023 0815   PROT 7.2 10/05/2018 1004   ALBUMIN 4.2 04/06/2023 1332   ALBUMIN 4.3 10/05/2018 1004   AST 19 11/07/2023 0815   AST 36 02/16/2022 1350   ALT 15 11/07/2023 0815   ALT 28 02/16/2022 1350   ALKPHOS 144 (H) 04/06/2023 1332   BILITOT 0.4 11/07/2023 0815   BILITOT 0.8 02/16/2022 1350   BILIDIR 0.1 02/23/2021 0927   IBILI 0.3 08/24/2013 1633      Component Value Date/Time   TSH 1.310 02/22/2023 0944   Nutritional Lab Results  Component Value Date   VD25OH 53 02/16/2023   VD25OH 69 07/22/2022   VD25OH 29.88 (L) 03/31/2021     Assessment and Plan Assessment & Plan Obesity Working on weight management through a caloric intake of 1200-1400 calories and 90 or more grams of protein. Meeting this goal about 50% of the time. Incorporating fruits and vegetables into diet and exercising 60 minutes three times a week, including weight training and cardio. Lost 2 pounds in the last two months and reports changes in body composition, such as clothes fitting differently, indicating a positive trend in body composition despite minimal weight loss. - Continue current dietary plan with 1200-1400 calories and 90 or more grams of protein. - Encourage continued exercise regimen of 60 minutes three times a week. - Reinforce the importance of meal regularity and adequate sleep (7-9 hours per night).  Prediabetes Stopped taking metformin  due to constipation, a side effect experienced by approximately 10% of users. Off metformin  for about two weeks. Primary management strategy is focused on diet and exercise, which constitute 80% of the treatment plan. Effectiveness of this approach will be evaluated with updated lab results, including hemoglobin A1c. - Order hemoglobin A1c to assess current glycemic control. - Evaluate lab  results to determine if metformin  needs to be restarted or if current management is sufficient.  Essential hypertension Initial blood pressure was elevated at 141/80 mmHg, but improved to 120/84 mmHg upon recheck. Hypertension is being managed with lifestyle modifications, including exercise and weight loss, in addition to amlodipine  5 mg daily. - Continue amlodipine  5 mg daily. - Encourage adherence to exercise and weight loss plan to aid in blood pressure control.  Hyperlipidemia Not currently on a statin and managing hyperlipidemia through diet, exercise, and weight loss. It has been a while since cholesterol levels were last checked. - Order lipid panel to assess current cholesterol levels. - Continue dietary and exercise interventions to manage lipid levels.  Vitamin D  deficiency Taking a multivitamin, but unclear if  this is sufficient to address vitamin D  deficiency. Vitamin D  levels have not been checked recently. - Order vitamin D  level to assess current status. - Evaluate the need for additional vitamin D  supplementation based on lab results.       Ruth [name] was informed of the importance of frequent follow up visits to maximize her success with intensive lifestyle modifications for her obesity and obesity related health conditions as recommended by USPSTF and CMS guidelines   Louann Penton, MD

## 2023-12-22 LAB — CMP14+EGFR
ALT: 18 IU/L (ref 0–32)
AST: 23 IU/L (ref 0–40)
Albumin: 4.1 g/dL (ref 3.8–4.9)
Alkaline Phosphatase: 182 IU/L — ABNORMAL HIGH (ref 49–135)
BUN/Creatinine Ratio: 15 (ref 12–28)
BUN: 13 mg/dL (ref 8–27)
Bilirubin Total: 0.3 mg/dL (ref 0.0–1.2)
CO2: 21 mmol/L (ref 20–29)
Calcium: 9.4 mg/dL (ref 8.7–10.3)
Chloride: 106 mmol/L (ref 96–106)
Creatinine, Ser: 0.88 mg/dL (ref 0.57–1.00)
Globulin, Total: 3.2 g/dL (ref 1.5–4.5)
Glucose: 85 mg/dL (ref 70–99)
Potassium: 4.1 mmol/L (ref 3.5–5.2)
Sodium: 143 mmol/L (ref 134–144)
Total Protein: 7.3 g/dL (ref 6.0–8.5)
eGFR: 75 mL/min/1.73 (ref >59)

## 2023-12-22 LAB — LIPID PANEL WITH LDL/HDL RATIO
Cholesterol, Total: 179 mg/dL (ref 100–199)
HDL: 66 mg/dL (ref >39)
LDL Chol Calc (NIH): 98 mg/dL (ref 0–99)
LDL/HDL Ratio: 1.5 ratio (ref 0.0–3.2)
Triglycerides: 84 mg/dL (ref 0–149)
VLDL Cholesterol Cal: 15 mg/dL (ref 5–40)

## 2023-12-22 LAB — VITAMIN D 25 HYDROXY (VIT D DEFICIENCY, FRACTURES): Vit D, 25-Hydroxy: 35 ng/mL (ref 30.0–100.0)

## 2023-12-22 LAB — INSULIN, RANDOM: INSULIN: 14 u[IU]/mL (ref 2.6–24.9)

## 2023-12-22 LAB — TSH: TSH: 2.7 u[IU]/mL (ref 0.450–4.500)

## 2023-12-22 LAB — HEMOGLOBIN A1C
Est. average glucose Bld gHb Est-mCnc: 111 mg/dL
Hgb A1c MFr Bld: 5.5 % (ref 4.8–5.6)

## 2023-12-22 LAB — VITAMIN B12: Vitamin B-12: 859 pg/mL (ref 232–1245)

## 2023-12-23 ENCOUNTER — Encounter: Payer: Self-pay | Admitting: Family Medicine

## 2023-12-23 MED ORDER — BREO ELLIPTA 100-25 MCG/ACT IN AEPB: 1.0000 | INHALATION_SPRAY | Freq: Every day | RESPIRATORY_TRACT | 2 refills | Status: DC

## 2024-01-02 ENCOUNTER — Ambulatory Visit (INDEPENDENT_AMBULATORY_CARE_PROVIDER_SITE_OTHER)

## 2024-01-02 VITALS — BP 131/84 | HR 89 | Temp 97.9°F | Resp 18 | Ht 64.0 in | Wt 196.0 lb

## 2024-01-02 DIAGNOSIS — D869 Sarcoidosis, unspecified: Secondary | ICD-10-CM

## 2024-01-02 MED ORDER — METHYLPREDNISOLONE SODIUM SUCC 40 MG IJ SOLR
40.0000 mg | Freq: Once | INTRAMUSCULAR | Status: AC
  Administered 2024-01-02: 40 mg via INTRAVENOUS
  Filled 2024-01-02: qty 1

## 2024-01-02 MED ORDER — SODIUM CHLORIDE 0.9 % IV SOLN
3.0000 mg/kg | Freq: Once | INTRAVENOUS | Status: AC
  Administered 2024-01-02: 300 mg via INTRAVENOUS
  Filled 2024-01-02: qty 30

## 2024-01-02 MED ORDER — DIPHENHYDRAMINE HCL 25 MG PO CAPS
25.0000 mg | ORAL_CAPSULE | Freq: Once | ORAL | Status: AC
  Administered 2024-01-02: 25 mg via ORAL
  Filled 2024-01-02: qty 1

## 2024-01-02 MED ORDER — ACETAMINOPHEN 325 MG PO TABS
650.0000 mg | ORAL_TABLET | Freq: Once | ORAL | Status: AC
  Administered 2024-01-02: 650 mg via ORAL
  Filled 2024-01-02: qty 2

## 2024-01-02 NOTE — Progress Notes (Signed)
 Diagnosis: Sarcoidosis  Provider:  Praveen Mannam MD  Procedure: IV Infusion  IV Type: Peripheral, IV Location: R Antecubital  Avsola  (infliximab -axxq), Dose: 300 mg  Infusion Start Time: 0857  Infusion Stop Time: 1112  Post Infusion IV Care: Peripheral IV Discontinued  Discharge: Condition: Good, Destination: Home . AVS Declined  Performed by:  Emslee Lopezmartinez, RN

## 2024-01-10 ENCOUNTER — Encounter: Payer: Self-pay | Admitting: Internal Medicine

## 2024-01-10 DIAGNOSIS — D869 Sarcoidosis, unspecified: Secondary | ICD-10-CM

## 2024-01-10 NOTE — Telephone Encounter (Signed)
 Last Fill: 08/26/2023  Labs: 12/21/2023 Alk. Phos 182, 11/07/2023 WBC 3.7, MCHC 31.0  Next Visit: 03/13/2024  Last Visit: 12/09/2023  DX: Sarcoidosis   Current Dose per office note 12/09/2023: Reduce Imuran  to 50 mg daily   Okay to refill Imuran ?

## 2024-01-12 MED ORDER — AZATHIOPRINE 50 MG PO TABS: 50.0000 mg | ORAL_TABLET | Freq: Every day | ORAL | 0 refills | Status: DC

## 2024-02-10 ENCOUNTER — Encounter (INDEPENDENT_AMBULATORY_CARE_PROVIDER_SITE_OTHER): Payer: Self-pay | Admitting: Family Medicine

## 2024-02-13 ENCOUNTER — Ambulatory Visit (INDEPENDENT_AMBULATORY_CARE_PROVIDER_SITE_OTHER): Payer: Self-pay | Admitting: Family Medicine

## 2024-02-27 ENCOUNTER — Ambulatory Visit

## 2024-03-01 ENCOUNTER — Ambulatory Visit

## 2024-03-01 VITALS — BP 120/77 | HR 80 | Temp 98.1°F | Resp 16 | Ht 64.0 in | Wt 200.0 lb

## 2024-03-01 DIAGNOSIS — D869 Sarcoidosis, unspecified: Secondary | ICD-10-CM

## 2024-03-01 DIAGNOSIS — Z79899 Other long term (current) drug therapy: Secondary | ICD-10-CM

## 2024-03-01 DIAGNOSIS — D861 Sarcoidosis of lymph nodes: Secondary | ICD-10-CM

## 2024-03-01 MED ORDER — ACETAMINOPHEN 325 MG PO TABS
650.0000 mg | ORAL_TABLET | Freq: Once | ORAL | Status: AC
  Administered 2024-03-01: 650 mg via ORAL
  Filled 2024-03-01: qty 2

## 2024-03-01 MED ORDER — METHYLPREDNISOLONE SODIUM SUCC 40 MG IJ SOLR
40.0000 mg | Freq: Once | INTRAMUSCULAR | Status: AC
  Administered 2024-03-01: 40 mg via INTRAVENOUS
  Filled 2024-03-01: qty 1

## 2024-03-01 MED ORDER — SODIUM CHLORIDE 0.9 % IV SOLN
3.0000 mg/kg | Freq: Once | INTRAVENOUS | Status: AC
  Administered 2024-03-01: 300 mg via INTRAVENOUS
  Filled 2024-03-01: qty 30

## 2024-03-01 MED ORDER — DIPHENHYDRAMINE HCL 25 MG PO CAPS
25.0000 mg | ORAL_CAPSULE | Freq: Once | ORAL | Status: AC
  Administered 2024-03-01: 25 mg via ORAL
  Filled 2024-03-01: qty 1

## 2024-03-01 NOTE — Progress Notes (Signed)
 Diagnosis: Sarcoidosis  Provider:  Lonna Coder MD  Procedure: IV Infusion  IV Type: Peripheral, IV Location: R Antecubital  Avsola  (infliximab -axxq), Dose: 300 mg  Infusion Start Time: 1343  Infusion Stop Time: 1558  Post Infusion IV Care: Peripheral IV Discontinued  Discharge: Condition: Good, Destination: Home . AVS Declined  Performed by:  Gwynn Crossley, RN

## 2024-03-02 LAB — COMPREHENSIVE METABOLIC PANEL WITH GFR
AG Ratio: 1.2 (calc) (ref 1.0–2.5)
ALT: 16 U/L (ref 6–29)
AST: 18 U/L (ref 10–35)
Albumin: 3.9 g/dL (ref 3.6–5.1)
Alkaline phosphatase (APISO): 147 U/L (ref 37–153)
BUN: 14 mg/dL (ref 7–25)
CO2: 27 mmol/L (ref 20–32)
Calcium: 8.8 mg/dL (ref 8.6–10.4)
Chloride: 109 mmol/L (ref 98–110)
Creat: 0.81 mg/dL (ref 0.50–1.05)
Globulin: 3.2 g/dL (ref 1.9–3.7)
Glucose, Bld: 111 mg/dL — ABNORMAL HIGH (ref 65–99)
Potassium: 3.8 mmol/L (ref 3.5–5.3)
Sodium: 143 mmol/L (ref 135–146)
Total Bilirubin: 0.4 mg/dL (ref 0.2–1.2)
Total Protein: 7.1 g/dL (ref 6.1–8.1)
eGFR: 83 mL/min/1.73m2 (ref >=60)

## 2024-03-02 LAB — CBC WITH DIFFERENTIAL/PLATELET
Absolute Lymphocytes: 1145 {cells}/uL (ref 850–3900)
Absolute Monocytes: 370 {cells}/uL (ref 200–950)
Basophils Absolute: 50 {cells}/uL (ref 0–200)
Basophils Relative: 1 %
Eosinophils Absolute: 110 {cells}/uL (ref 15–500)
Eosinophils Relative: 2.2 %
HCT: 41.9 % (ref 35.9–46.0)
Hemoglobin: 13.5 g/dL (ref 11.7–15.5)
MCH: 28.1 pg (ref 27.0–33.0)
MCHC: 32.2 g/dL (ref 31.6–35.4)
MCV: 87.1 fL (ref 81.4–101.7)
MPV: 11.3 fL (ref 7.5–12.5)
Monocytes Relative: 7.4 %
Neutro Abs: 3325 {cells}/uL (ref 1500–7800)
Neutrophils Relative %: 66.5 %
Platelets: 206 Thousand/uL (ref 140–400)
RBC: 4.81 Million/uL (ref 3.80–5.10)
RDW: 13.6 % (ref 11.0–15.0)
Total Lymphocyte: 22.9 %
WBC: 5 Thousand/uL (ref 3.8–10.8)

## 2024-03-04 LAB — QUANTIFERON-TB GOLD PLUS
Mitogen-NIL: 6.76 [IU]/mL
NIL: 0.04 [IU]/mL
QuantiFERON-TB Gold Plus: NEGATIVE
TB1-NIL: 0 [IU]/mL
TB2-NIL: 0 [IU]/mL

## 2024-03-05 NOTE — Progress Notes (Signed)
 Office Visit Note  Patient: Ruth [name]             Date of Birth: Apr 27, 1960           MRN: [id number]             PCP: Antonio Cyndee Jamee JONELLE, DO Referring: Antonio Cyndee Jamee JONELLE, DO Visit Date: 03/13/2024   Subjective:   Discussed the use of AI scribe software for clinical note transcription with the patient, who gave verbal consent to proceed.  History of Present Illness   Ruth [name] is a 63 y.o. female here for follow up for sarcoidosis with bulky cervical lymphadenopathy now on Avsola  infusions 3 mg/kg q8wks and azathioprine  50 mg daily.      She has been receiving regular infusions for sarcoidosis since January 2025. Her response to the infusions has been positive, with no major illnesses since the last visit, except for minor sinus issues not requiring antibiotics.  She had an eye exam follow-up with Dr. Maree in October, which showed no active inflammation, and she is not scheduled for another visit until next year. In November she visited [facility], where no changes to her treatment were made.  Overall, she feels well with no significant joint pain or stiffness. She regularly attends the gym, which does not exacerbate her symptoms, and has not needed over-the-counter medications for joint pain. She inquires about the safety of using a sauna post-workout, which she finds beneficial for her joints.  In her social history, she uses collagen peptides daily for skin, hair, and nails, and is considering its effects on her skin post-weight loss.      Previous HPI 12/09/2023 Ruth [name] is a 63 y.o. female here for follow up for sarcoidosis with bulky cervical lymphadenopathy now on Avsola  infusions 3 mg/kg q8wks started January and azathioprine  50 mg daily.      She experiences intermittent blurriness in her vision, affecting both near and far sight, without associated pain or discomfort. An eye doctor previously noted a small cataract in one eye months ago,  but no concern was expressed at that time.   She has not experienced any joint swelling or new rashes, although she has eczema causing dryness on her neck. She maintains the ability to work out three times a week without significant pain.   Recently, she experienced upper respiratory congestion, which she attributes to allergies. She has not been on antibiotics for this issue. Her recent lab work from three weeks ago included a slightly low white blood cell count, with a neutrophil count that was not low.    Previous HPI 08/26/2023 Ruth [name] is a 63 y.o. female here for follow up for sarcoidosis with bulky cervical lymphadenopathy now on Avsola  infusions 3 mg/kg q8wks started January 9th and azathioprine  50 mg daily.     She has been undergoing treatment for sarcoidosis and reports an overall improvement in her condition. Her ACE levels, which were previously elevated, have decreased to 80 as of February. Recent lab tests did not include ACE levels but showed a decrease in white blood cell count to 3.3, which she attributes to medication side effects. She is currently on low-dose azathioprine  and Epsolay.   She experiences minor joint pain, particularly in the mornings, but overall feels better. Swelling has improved, and she has been losing weight, which she attributes to avoiding steroids. She has not had any recent illnesses requiring antibiotics but has ongoing allergy  issues for which  she takes Flonase , another nasal spray, and loratadine  10 mg daily.   She describes an episode of bruising from an infusion attempt in her left arm, which has resolved, and she prefers using the other arm for infusions. She feels more mobile since starting infusions, although she occasionally experiences stiffness. She has taken ibuprofen  twice in the past week for body aches.   No recent swelling in her feet or ankles. She mentions persistent pain in her thumb, which she attributes to frequent use, such as  texting. She feels the pain even when not using her thumb, particularly in the area near the base of the thumb.         Previous HPI 05/20/2023 Ruth [name] is a 63 y.o. female here for follow up for sarcoidosis with bulky cervical lymphadenopathy now on Avsola  infusions 3 mg/kg started January 9th and azathioprine  100 mg daily.     She is currently undergoing treatment for sarcoidosis with Evzola infusions, which began in January. She is in the loading stage, receiving infusions every four to eight weeks. Her ACE levels, previously elevated due to sarcoidosis, decreased slightly with steroid use but increased again when steroids were reduced. She takes Imuran  to prevent antibody formation against the infusion medication, experiencing constipation as a side effect. No significant side effects from the infusions are noted, except for initial sluggishness that resolves by the next day.   She experiences joint pain, particularly in her hands, which is less severe than during her last visit. Morning stiffness improves throughout the day. No radiating pain or pain in her feet and ankles is reported.   No new rashes or swelling, except for a recurring rash on her buttocks, which she associates with sarcoidosis.   No recent illnesses except for sinus issues and post-nasal drip. Overall, she feels better with less joint pain and increased energy levels. She also made a recent switch from a prescription vitamin D  to over-the-counter vitamin D3.        Previous HPI 02/16/2023 Ruth [name] is a 63 y.o. female here for follow up for sarcoidosis with bulky cervical lymphadenopathy now on azathioprine  100 mg daily.  She tapered off of prednisone  completely since our last visit.  Since stopping the medication noticed increased in joint stiffness and achiness in multiple areas.  Not with any visible swelling.  Seems worse first thing in the morning and getting up after she sits for more than a few  minutes at a time.  She is noticing nausea and decreased appetite from the azathioprine  also noticed a darker color to her urine.  No new skin rashes.  Still has ongoing lymph node swelling but decreased size from before.   Previous HPI 12/16/2022 Ruth [name] is a 63 y.o. female here for follow up for sarcoidosis with bulky cervical lymphadenopathy on prednisone  10 mg daily.  She developed severe side effects from methotrexate  after last visit and stopped taking the medication.  She was experiencing dizziness and severe fatigue lasting for 3 to 4 days at a time after each dose.  When she stopped the methotrexate  after a week she felt back to her normal state.  We discussed starting azathioprine  as an alternate steroid sparing DMARD but she did not begin this with concerns about the medication after her bad experience of methotrexate .  Has been taking the prednisone  10 mg daily and feels that the large swollen nodules in her neck have continued to improve.  No new chest pain shortness of  breath or coughing.  She does have a large amount of unintentional weight gain from the prolonged steroids estimates about 37 pounds.   Previous HPI 10/07/2022 Ruth [name] is a 63 y.o. female here for follow up for sarcoidosis with bulky cervical lymphadenopathy now on methotrexate  15 mg p.o. weekly folic acid  1 mg daily and tapered down off prednisone  after last visit.  She is only started the methotrexate  in the past 2 weeks was initially delaying this as she was at the hospital supporting her daughter who was admitted with major pregnancy complications.  X-ray obtained did not suggest any mediastinal lymphadenopathy.  Symptoms overall pretty stable still with bulky swallowing throughout the neck but not particularly painful.   Previous HPI 08/31/22 Ruth [name] is a 63 y.o. female here for follow up for sarcoidosis with fatigue and bulky lymphadenopathy now on prednisone  20 mg daily.  She has seen  improvement of the large lymph node swelling.  Has noticed several side effects with unintentional weight gain, blurry vision, and cramping in her calfs and toes.  She has been trying to moderate her diet but definitely still increased appetite and becoming very tired shortly after eating.  Joint pain and stiffness including low back pain is doing great on the steroids.   Previous HPI 07/22/22 Ruth [name] is a 63 y.o. female here for evaluation of possible sarcoidosis with cervical lymphadenopathy evaluated with excisional biopsy.  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  Lymph node swelling started in July of last year she did not recall any specific preceding major illness or medical event.  Her initial visit with PCP labs did have positive IgM and IgG for EBV but without known sick contact for infectious mononucleosis.  She was initially treated with Keflex  and saw temporary improvement in symptoms but swelling has returned and been present continuously for at least the past 8 months.  Has been associated with some fatigue and had some tooth pain otherwise does not recall major change in symptoms otherwise.  Due to persistent lymphadenopathy she had CT imaging of the neck concerning for lymphoma and saw Dr. Timmy with oncology.  Lab testing at the time showed a low positive ANA and she has a persistent elevated alkaline phosphatase attributed to gallbladder disease with multiple stones but minimal symptoms.  Subsequently went for cervical lymph node biopsy with ENT Dr. Carlie this was negative for malignancy and suspicious for sarcoidosis or other reactive or autoimmune inflammatory process. She has chronic persistent moderate asthma on inhaler treatments and intermittently requires systemic steroids last treatment in March due to exacerbation with 1 week of prednisone .  But  has not noticed cough dyspnea chest pain or other respiratory complaints outside of the usual for her asthma and allergies. She has a long history of eczema this was severe as a child with mild persistent symptoms still ongoing.  There are a few areas of permanent residual hypopigmentation.  She had a few skin colored raised nodules along the corners of the mouth and on her back evaluated by dermatology last year with biopsy showing some interstitial granulomatous process. She has chronic joint pain and stiffness most often her legs with trouble from hip and knee pain that is worse trying to get up from the floor.  Occasionally gets pain in her hands and feet but without visible swelling.  She does not take any daily medication for joint pains.  She denies any new visual changes eye pain or inflammation.  Does have chronically dry eyes but thinks this may be related to her allergies or antihistamines.  No new hair loss, no photosensitivity, no Raynaud's symptoms.   02/05/22 CT Neck IMPRESSION: Widespread lymphadenopathy throughout all cervical nodal stations, symmetric from right to left. This includes enlargement of intraparotid lymph nodes. No single dominant node or necrotic node. Bilateral axillary lymphadenopathy also present. 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 Possible reactive nodal enlargement to some sort of systemic infection or autoimmune disease is also possible.   Review of Systems  Constitutional:  Negative for fatigue.  HENT:  Positive for mouth dryness. Negative for mouth sores.   Eyes:  Positive for dryness.  Respiratory:  Negative for shortness of breath.   Cardiovascular:  Negative for chest pain and palpitations.  Gastrointestinal:  Negative for blood in stool, constipation and diarrhea.  Endocrine: Negative for increased urination.  Genitourinary:  Negative for involuntary urination.  Musculoskeletal:  Positive for joint pain, gait problem and joint pain. Negative for  joint swelling, myalgias, muscle weakness, morning stiffness, muscle tenderness and myalgias.  Skin:  Negative for color change, rash, hair loss and sensitivity to sunlight.  Allergic/Immunologic: Negative for susceptible to infections.  Neurological:  Negative for dizziness and headaches.  Hematological:  Negative for swollen glands.  Psychiatric/Behavioral:  Positive for sleep disturbance. Negative for depressed mood. The patient is nervous/anxious.     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   Preventative health care 07/07/2020   SAH (subarachnoid hemorrhage) (HCC) 07/21/2019   RUQ pain 06/18/2019   Dyspepsia 06/18/2019   Moderate persistent asthma with acute exacerbation 03/05/2019   Vitamin D  deficiency 07/03/2018   Depression 07/03/2018   Class 2 severe obesity with serious comorbidity and body mass index (BMI) of 37.0 to 37.9 in adult 07/03/2018   Essential hypertension 09/19/2017   Aneurysm, cerebral, nonruptured 05/06/2015   White matter abnormality on MRI of brain 05/06/2015   Migraine headache without aura 05/06/2015   Dizziness 05/06/2015   Foreign body of ear, left 09/05/2012   HTN (hypertension) 06/12/2012   Allergic rhinitis due to pollen 12/27/2007   Allergic-infective asthma 08/29/2006   ECZEMA 08/29/2006    Past Medical History:  Diagnosis Date   Allergic rhinitis, cause unspecified    Allergy     nuts   Asthma     Constipation    Eczema    Food allergy     Gallbladder problem    GERD (gastroesophageal reflux disease)    Headache    Hypertension    Joint pain    Vitamin D  deficiency     Family History  Problem Relation Age of Onset   Asthma Mother    Allergies Mother    Asthma Father    Drug abuse Father    Allergies Father    Past Surgical History:  Procedure Laterality Date   BRAIN SURGERY     CESAREAN SECTION Bilateral [id number]   TUBAL  LIGATION  [id number]   Social History   Social History Narrative   Exercise--no   Right handed   Caffiene1 cup   Lives alone and a two story home   Immunization History  Administered Date(s) Administered   PFIZER(Purple Top)SARS-COV-2 Vaccination 10/19/2019, 11/09/2019   Td 05/27/2012     Objective: Vital Signs: BP 127/80 (BP Location: Right Arm, Patient Position: Sitting, Cuff Size: Normal)   Pulse 72   Temp 98.3 F (36.8 C)   Resp 16   Ht 5' 4 (1.626 m)   Wt 200 lb (90.7 kg)   LMP 09/19/2015   BMI 34.33 kg/m    Physical Exam Constitutional:      Appearance: She is obese.  Eyes:     Conjunctiva/sclera: Conjunctivae normal.  Neck:     Comments: Nontender cervical adenopathy b/l Cardiovascular:     Rate and Rhythm: Normal rate and regular rhythm.  Pulmonary:     Effort: Pulmonary effort is normal.     Breath sounds: Normal breath sounds.  Musculoskeletal:     Right lower leg: No edema.     Left lower leg: No edema.  Lymphadenopathy:     Cervical: Cervical adenopathy present.  Skin:    General: Skin is warm and dry.     Findings: No rash.  Neurological:     Mental Status: She is alert.  Psychiatric:        Mood and Affect: Mood normal.      Musculoskeletal Exam:  Shoulders full ROM no tenderness or swelling Elbows full ROM no tenderness or swelling Wrists full ROM no tenderness or swelling Fingers full ROM no tenderness or swelling Knees full ROM no tenderness or swelling, mild patellofemoral crepitus  present Ankles full ROM no tenderness or swelling    Investigation: No additional findings.  Imaging: No results found.  Recent Labs: Lab Results  Component Value Date   WBC 5.0 03/01/2024   HGB 13.5 03/01/2024   PLT 206 03/01/2024   NA 143 03/01/2024   K 3.8 03/01/2024   CL 109 03/01/2024   CO2 27 03/01/2024   GLUCOSE 111 (H) 03/01/2024   BUN 14 03/01/2024   CREATININE 0.81 03/01/2024   BILITOT 0.4 03/01/2024   ALKPHOS 182 (H) 12/21/2023   AST 18 03/01/2024   ALT 16 03/01/2024   PROT 7.1 03/01/2024   ALBUMIN 4.1 12/21/2023   CALCIUM 8.8 03/01/2024   GFRAA >60 07/21/2019   QFTBGOLDPLUS NEGATIVE 03/01/2024    Speciality Comments: No specialty comments available.  Procedures:  No procedures performed Allergies: Other and Chlorhexidine gluconate   Assessment / Plan:     Visit Diagnoses: Sarcoidosis - Plan: azaTHIOprine  (IMURAN ) 50 MG tablet IWell-managed with stable disease activity. Current infusion therapy effective. Interval eye exam is very good.  Discussed potential lymph node calcification due to chronicity, likely non-interventional.  - Continue Avsola  infusions 3 mg/kg IV q8wks and Imuran  50 mg daily. - Plan to obtain repeat cervical CT scan early next year, maybe after February or March f/u if Sx going okay can deescalate to just AZA  High risk medication use - Reduce Imuran  to 50 mg daily.  Avsola  infusions currently in loading dose series at 3 mg/kg, with potential for dose adjustment based on response Reviewed recent labs quantiferon CBC, CMP look good no problem for continuing current treatment. No serious interval infections. Is having seasonal allergy  type congestion. Last steroids and abtx needed in July this year.     Orders: No orders of the  defined types were placed in this encounter.  Meds ordered this encounter  Medications   azaTHIOprine  (IMURAN ) 50 MG tablet    Sig: Take 1 tablet (50 mg total) by mouth daily.    Dispense:  90 tablet     Refill:  0     Follow-Up Instructions: Return in about 10 weeks (around 05/22/2024) for Sarcoid on IFX/AZA f/u 2.43mos.   Lonni LELON Ester, MD  Note - This record has been created using [employer].  Chart creation errors have been sought, but may not always  have been located. Such creation errors do not reflect on  the standard of medical care.

## 2024-03-13 ENCOUNTER — Ambulatory Visit: Attending: Internal Medicine | Admitting: Internal Medicine

## 2024-03-13 ENCOUNTER — Encounter: Payer: Self-pay | Admitting: Internal Medicine

## 2024-03-13 VITALS — BP 127/80 | HR 72 | Temp 98.3°F | Resp 16 | Ht 64.0 in | Wt 200.0 lb

## 2024-03-13 DIAGNOSIS — D869 Sarcoidosis, unspecified: Secondary | ICD-10-CM

## 2024-03-13 DIAGNOSIS — Z79899 Other long term (current) drug therapy: Secondary | ICD-10-CM

## 2024-03-13 MED ORDER — AZATHIOPRINE 50 MG PO TABS: 50.0000 mg | ORAL_TABLET | Freq: Every day | ORAL | 0 refills | Status: AC

## 2024-03-26 ENCOUNTER — Ambulatory Visit (INDEPENDENT_AMBULATORY_CARE_PROVIDER_SITE_OTHER): Admitting: Family Medicine

## 2024-03-29 ENCOUNTER — Telehealth: Payer: Self-pay | Admitting: Pharmacy Technician

## 2024-03-29 NOTE — Telephone Encounter (Signed)
 Auth Submission: APPROVED Site of care: Site of care: [facility] Payer: [employer] Medication & CPT/J Code(s) submitted: Avsola  (infliximab -axxq) V4878 Diagnosis Code:  Route of submission (phone, fax, portal): LATENT Phone # Fax # Auth type: Buy/Bill PB Units/visits requested: 300MG  Q8WKS Reference number: [id number] Approval from: 03/26/24 to 03/25/25

## 2024-04-09 ENCOUNTER — Encounter: Payer: Self-pay | Admitting: Family Medicine

## 2024-04-09 DIAGNOSIS — I10 Essential (primary) hypertension: Secondary | ICD-10-CM

## 2024-04-10 MED ORDER — BREO ELLIPTA 100-25 MCG/ACT IN AEPB: 1.0000 | INHALATION_SPRAY | Freq: Every day | RESPIRATORY_TRACT | 0 refills | Status: AC

## 2024-04-10 MED ORDER — AMLODIPINE BESYLATE 5 MG PO TABS: ORAL_TABLET | ORAL | 0 refills | Status: AC

## 2024-04-26 ENCOUNTER — Ambulatory Visit (INDEPENDENT_AMBULATORY_CARE_PROVIDER_SITE_OTHER): Admitting: Family Medicine

## 2024-04-26 ENCOUNTER — Encounter (INDEPENDENT_AMBULATORY_CARE_PROVIDER_SITE_OTHER): Payer: Self-pay | Admitting: Family Medicine

## 2024-04-26 VITALS — BP 130/83 | HR 71 | Temp 98.0°F | Ht 64.0 in | Wt 200.0 lb

## 2024-04-26 DIAGNOSIS — E559 Vitamin D deficiency, unspecified: Secondary | ICD-10-CM | POA: Diagnosis not present

## 2024-04-26 DIAGNOSIS — E669 Obesity, unspecified: Secondary | ICD-10-CM | POA: Diagnosis not present

## 2024-04-26 DIAGNOSIS — Z6834 Body mass index (BMI) 34.0-34.9, adult: Secondary | ICD-10-CM

## 2024-04-26 DIAGNOSIS — R7303 Prediabetes: Secondary | ICD-10-CM

## 2024-04-26 MED ORDER — VITAMIN D (ERGOCALCIFEROL) 1.25 MG (50000 UNIT) PO CAPS: 50000.0000 [IU] | ORAL_CAPSULE | ORAL | 0 refills | Status: AC

## 2024-04-26 NOTE — Addendum Note (Signed)
 Addended by: LAFE BAKER CROME on: 04/26/2024 10:15 AM   Modules accepted: Level of Service

## 2024-04-26 NOTE — Progress Notes (Signed)
 "  Office: [phone]  /  Fax: [phone]  WEIGHT SUMMARY AND BIOMETRICS  Anthropometric Measurements Height: 5' 4 (1.626 m) Weight: 200 lb (90.7 kg) BMI (Calculated): 34.31 Weight at Last Visit: 195lb Weight Lost Since Last Visit: 0lb Weight Gained Since Last Visit: 5lb Starting Weight: 221lb Total Weight Loss (lbs): 1 lb (0.454 kg) Peak Weight: 230lb Waist Measurement : 43 inches   Body Composition  Body Fat %: 43 % Fat Mass (lbs): 86.2 lbs Muscle Mass (lbs): 108.6 lbs Total Body Water (lbs): 74 lbs Visceral Fat Rating : 12   Other Clinical Data RMR: 1901 Fasting: yes Labs: No Today's Visit #: 11 Starting Date: 02/22/23    Chief Complaint: OBESITY    History of Present Illness Ruth [name] is a 64 year old female with obesity and prediabetes who presents for obesity treatment.  She has gained five pounds over the last four months, attributing this to her busy schedule with work and other commitments, which has impacted her ability to focus on weight loss efforts. She was previously prescribed a journaling plan with a caloric intake of 1200-1400 calories and 80 or more grams of protein but has not adhered to it. She exercises three days a week for 60 minutes, incorporating weights and cardio.  Her eating habits have been poor since Thanksgiving, with increased snacking leading to skipping meals. She experiences bloating at times but maintains energy levels due to her consistent workout routine. Living alone, she finds it challenging to meal prep for one person, often struggling with leftovers and preferring diverse meals.  In addition to obesity, she is managing prediabetes primarily through diet, exercise, and weight loss, with previous nutritional counseling. She was on metformin  in the past but discontinued it due to constipation. Her hemoglobin A1c was measured at 5.8 and 5.5.  She has a history of vitamin D  deficiency, with her most recent level being  low at 52 in September of the previous year. She is currently taking a multivitamin, which likely contains about 1000 IU of vitamin D . She is not on additional vitamin D  supplementation at this time.  She receives infusions of infliximab  and reports feeling better, particularly in the gym.      PHYSICAL EXAM:  Blood pressure 130/83, pulse 71, temperature 98 F (36.7 C), height 5' 4 (1.626 m), weight 200 lb (90.7 kg), last menstrual period 09/19/2015, SpO2 99%. Body mass index is 34.33 kg/m.  DIAGNOSTIC DATA REVIEWED BY MYSELF TODAY:  BMET    Component Value Date/Time   NA 143 03/01/2024 1258   NA 143 12/21/2023 0811   K 3.8 03/01/2024 1258   CL 109 03/01/2024 1258   CO2 27 03/01/2024 1258   GLUCOSE 111 (H) 03/01/2024 1258   BUN 14 03/01/2024 1258   BUN 13 12/21/2023 0811   CREATININE 0.81 03/01/2024 1258   CALCIUM 8.8 03/01/2024 1258   GFRNONAA >60 02/16/2022 1350   GFRAA >60 07/21/2019 0937   Lab Results  Component Value Date   HGBA1C 5.5 12/21/2023   HGBA1C 5.7 (H) 07/21/2017   Lab Results  Component Value Date   INSULIN  14.0 12/21/2023   INSULIN  15.0 07/21/2017   Lab Results  Component Value Date   TSH 2.700 12/21/2023   CBC    Component Value Date/Time   WBC 5.0 03/01/2024 1258   RBC 4.81 03/01/2024 1258   HGB 13.5 03/01/2024 1258   HGB 13.1 02/16/2022 1350   HGB 14.2 04/13/2018 1001   HCT 41.9 03/01/2024 1258  HCT 43.1 04/13/2018 1001   PLT 206 03/01/2024 1258   PLT 240 02/16/2022 1350   PLT 235 04/13/2018 1001   MCV 87.1 03/01/2024 1258   MCV 85 04/13/2018 1001   MCH 28.1 03/01/2024 1258   MCHC 32.2 03/01/2024 1258   RDW 13.6 03/01/2024 1258   RDW 13.6 04/13/2018 1001   Iron Studies No results found for: IRON, TIBC, FERRITIN, IRONPCTSAT Lipid Panel     Component Value Date/Time   CHOL 179 12/21/2023 0811   TRIG 84 12/21/2023 0811   HDL 66 12/21/2023 0811   CHOLHDL 2 01/19/2022 1402   VLDL 12.0 01/19/2022 1402   LDLCALC 98  12/21/2023 0811   LDLCALC 123 (H) 01/04/2020 1019   Hepatic Function Panel     Component Value Date/Time   PROT 7.1 03/01/2024 1258   PROT 7.3 12/21/2023 0811   ALBUMIN 4.1 12/21/2023 0811   AST 18 03/01/2024 1258   AST 36 02/16/2022 1350   ALT 16 03/01/2024 1258   ALT 28 02/16/2022 1350   ALKPHOS 182 (H) 12/21/2023 0811   BILITOT 0.4 03/01/2024 1258   BILITOT 0.3 12/21/2023 0811   BILITOT 0.8 02/16/2022 1350   BILIDIR 0.1 02/23/2021 0927   IBILI 0.3 08/24/2013 1633      Component Value Date/Time   TSH 2.700 12/21/2023 0811   Nutritional Lab Results  Component Value Date   VD25OH 35.0 12/21/2023   VD25OH 53 02/16/2023   VD25OH 69 07/22/2022     Assessment and Plan Assessment & Plan Obesity Management is ongoing with a focus on diet, exercise, and weight loss. She has gained five pounds over the last four months due to increased snacking and lack of meal planning since Thanksgiving. She exercises three days a week for 60 minutes, including weights and cardio. Challenges include meal planning and journaling, especially living alone and preferring diverse meals. - Implement meal prep strategy with three two-serving meals per Sunday.  - Meal prep recipes handout given today - Maintain calorie intake of 1200-1400 calories per day with 85 or more grams of protein. - Use a food journaling app to track meals and protein intake. - Encouraged continued exercise regimen.  Prediabetes Managed primarily through diet, exercise, and weight loss. Hemoglobin A1c was 5.8 to 5.5, within the target range. She is not currently on metformin  due to previous constipation side effects. - Continue dietary and exercise regimen to manage prediabetes. - Monitor hemoglobin A1c levels regularly.  Vitamin D  deficiency Recent vitamin D  level of 35 in September of last year. She is currently on a multivitamin with approximately 1000 IU of vitamin D . No additional vitamin D  supplementation was  previously taken due to concerns about toxicity. - Prescribed weekly vitamin D  supplementation in addition to the multivitamin. - Sent prescription to [facility] on [address] for a 90-day supply. - Will re-evaluate vitamin D  levels in future lab work.      Patients who are on anti-obesity medications are counseled on the importance of maintaining healthy lifestyle habits, including balanced nutrition, regular physical activity, and behavioral modifications,  Medication is an adjunct to, not a replacement for, lifestyle changes and that the long-term success and weight maintenance depend on continued adherence to these strategies.   Mareli was informed of the importance of frequent follow up visits to maximize her success with intensive lifestyle modifications for her obesity and obesity related health conditions as recommended by USPSTF and CMS guidelines Louann Penton, MD   "

## 2024-04-30 ENCOUNTER — Ambulatory Visit

## 2024-05-01 ENCOUNTER — Ambulatory Visit

## 2024-05-01 ENCOUNTER — Ambulatory Visit: Payer: Self-pay | Admitting: Internal Medicine

## 2024-05-01 ENCOUNTER — Other Ambulatory Visit: Payer: Self-pay

## 2024-05-01 VITALS — BP 122/81 | HR 85 | Temp 97.7°F | Resp 18 | Ht 64.0 in | Wt 205.0 lb

## 2024-05-01 DIAGNOSIS — D869 Sarcoidosis, unspecified: Secondary | ICD-10-CM

## 2024-05-01 LAB — COMPLETE METABOLIC PANEL WITHOUT GFR
AG Ratio: 1.3 (calc) (ref 1.0–2.5)
ALT: 23 U/L (ref 6–29)
AST: 21 U/L (ref 10–35)
Albumin: 4.3 g/dL (ref 3.6–5.1)
Alkaline phosphatase (APISO): 178 U/L — ABNORMAL HIGH (ref 37–153)
BUN: 12 mg/dL (ref 7–25)
CO2: 29 mmol/L (ref 20–32)
Calcium: 9.2 mg/dL (ref 8.6–10.4)
Chloride: 105 mmol/L (ref 98–110)
Creat: 0.85 mg/dL (ref 0.50–1.05)
Globulin: 3.2 g/dL (ref 1.9–3.7)
Glucose, Bld: 87 mg/dL (ref 65–99)
Potassium: 3.8 mmol/L (ref 3.5–5.3)
Sodium: 141 mmol/L (ref 135–146)
Total Bilirubin: 0.4 mg/dL (ref 0.2–1.2)
Total Protein: 7.5 g/dL (ref 6.1–8.1)

## 2024-05-01 LAB — CBC WITH DIFFERENTIAL/PLATELET
Absolute Lymphocytes: 1274 {cells}/uL (ref 850–3900)
Absolute Monocytes: 297 {cells}/uL (ref 200–950)
Basophils Absolute: 50 {cells}/uL (ref 0–200)
Basophils Relative: 1.1 %
Eosinophils Absolute: 117 {cells}/uL (ref 15–500)
Eosinophils Relative: 2.6 %
HCT: 43.7 % (ref 35.9–46.0)
Hemoglobin: 14.3 g/dL (ref 11.7–15.5)
MCH: 28.5 pg (ref 27.0–33.0)
MCHC: 32.7 g/dL (ref 31.6–35.4)
MCV: 87.2 fL (ref 81.4–101.7)
MPV: 11.6 fL (ref 7.5–12.5)
Monocytes Relative: 6.6 %
Neutro Abs: 2763 {cells}/uL (ref 1500–7800)
Neutrophils Relative %: 61.4 %
Platelets: 202 10*3/uL (ref 140–400)
RBC: 5.01 Million/uL (ref 3.80–5.10)
RDW: 13.5 % (ref 11.0–15.0)
Total Lymphocyte: 28.3 %
WBC: 4.5 10*3/uL (ref 3.8–10.8)

## 2024-05-01 MED ORDER — SODIUM CHLORIDE 0.9 % IV SOLN
3.0000 mg/kg | Freq: Once | INTRAVENOUS | Status: AC
  Administered 2024-05-01: 300 mg via INTRAVENOUS
  Filled 2024-05-01: qty 30

## 2024-05-01 MED ORDER — DIPHENHYDRAMINE HCL 25 MG PO CAPS
25.0000 mg | ORAL_CAPSULE | Freq: Once | ORAL | Status: AC
  Administered 2024-05-01: 25 mg via ORAL
  Filled 2024-05-01: qty 1

## 2024-05-01 MED ORDER — ACETAMINOPHEN 325 MG PO TABS
650.0000 mg | ORAL_TABLET | Freq: Once | ORAL | Status: AC
  Administered 2024-05-01: 650 mg via ORAL
  Filled 2024-05-01: qty 2

## 2024-05-01 MED ORDER — METHYLPREDNISOLONE SODIUM SUCC 40 MG IJ SOLR
40.0000 mg | Freq: Once | INTRAMUSCULAR | Status: AC
  Administered 2024-05-01: 40 mg via INTRAVENOUS
  Filled 2024-05-01: qty 1

## 2024-05-01 NOTE — Progress Notes (Signed)
 Diagnosis: Sarcoidosis  Provider:  Lonna Coder MD  Procedure: IV Infusion  IV Type: Peripheral, IV Location: R Antecubital  Avsola  (infliximab -axxq), Dose: 300 mg  Infusion Start Time: 1224  Infusion Stop Time: 1440  Post Infusion IV Care: Peripheral IV Discontinued  Discharge: Condition: Good, Destination: Home . AVS Declined  Performed by:  Kemyah Buser, RN

## 2024-05-04 NOTE — Progress Notes (Unsigned)
 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  MRA personally reviewed.   UPDATE: No migraines.    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 There is a tiny focus of flow-related signal at the base of the treated aneurysm which has decreased in size from the prior MRA of 03/31/2021 (now measuring 1 mm).  Follow up MRA of head on 03/29/2023 redemonstrated susceptibility artifact from an aneurysm clip along the anterior communicating artery. No definite flow related signal is seen in the treated aneurysm.  Current NSAIDS/analgesics:  Tylenol , rarely ibuprofen  Current triptans:  none Current ergotamine:  none Current anti-emetic:  none Current muscle relaxants:  none Current Antihypertensive medications:  amlodipine  Current Antidepressant medications:  none Current Anticonvulsant medications:  none Current anti-CGRP:  none Current Vitamins/Herbal/Supplements:  none Current Antihistamines/Decongestants:  Flonase  Other therapy:  none Hormone/birth control:  none   Caffeine:  Sweet tea daily.  No coffee or cola Diet:  Drinks water.  Usually wouldn't skip meals but she has since topiramate  Exercise:  Not recently Depression: no ; Anxiety:  yes Other pain:  Localized head pain from surgery Sleep hygiene:  Sleeps okay.  Feels tired   HISTORY: She has longstanding history of headaches since young adulthood, which she says have been attributed to allergies and sinus issues. In 2016, she reported  different type of headache.  They were right sided, throbbing/pressure and 8/10 intensity.  At that time, they lasted all day and occurred daily.  It is associated with nausea, photophobia, phonophobia and osmophobia.  She also reported seeing floaters but her eye doctor said it was normal for her age.  She also feels a little dizzy.  She would Advil  or Tylenol  almost daily, with variable effect.  She denies family history of headache. She then has had episodes of severe dizziness, not associated with headache.  It is not spinning but it is a sense of movement and feeling off-balance.  There is mild nausea but no vomiting.  It occurs spontaneously throughout the day and each episode can last up to 4 hours non-stop.  She noted some bilateral tinnitus.  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  She subsequently established care with another neurologist, Dr. Vear, who recommended close follow up.  She was lost to follow up with Dr. Vear due to the COVID-19 pandemic until she saw him again in April 2021 and started her on topiramate .  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     Prior to the aneurysm rupture, migraines were infrequent,about once a month, and responded within 1 hour with Tylenol .  Since the surgery, she has had some head on side of the surgery and persistent right ear pressure.  ENT found nothing wrong.  Repeat MRA  of head on 03/31/2021 was stable.   Imaging personally reviewed: 03/11/2015 MRI BRAIN W WO:  1.  No acute intracranial abnormality.  2. Mild to moderate for age nonspecific mostly subcortical cerebral white matter signal changes. Solitary small nonspecific focus of T2 hyperintensity in the cerebellum, most  resembling a chronic lacune. 04/16/2015 CTA HEAD:  1. 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  2. Otherwise normal CTA circle of Willis without focal stenosis, other aneurysm, or branch vessel occlusion.  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 Mild extent of white matter foci in the subcortical white matter in the left cerebral hemisphere consistent with mild chronic microvascular ischemic changes. None of these appear to be acute and there is no change when compared to the MRI dated 03/11/2015.  07/21/2019 CT BRAIN WO: 1. There are findings concerning for potential subarachnoid hemorrhage, although this could simply reflect pseudo subarachnoid hemorrhage related to interval development of mild hydrocephalus compared to prior study from 2017. Given the patient's documented history of ACA aneurysm, further evaluation with repeat head CTA is recommended at this time. 07/21/2019 CTA HEAD & NECK:  1. 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  Trace subarachnoid hemorrhage including trace IVH visible now. Mild proximal ACA vasospasm. Acute ventriculomegaly, although no definite transependymal edema.  Note also dominant Left ACA A1. 2. Elsewhere stable and negative CTA head and neck. No atherosclerosis or arterial stenosis identified. 03/31/2021 MRA HEAD:  1. Sequelae of prior surgical clipping of anterior communicating artery aneurysm with persistent faint 2 mm remnant. Appearance is stable and not significantly changed as compared to previous.2. Otherwise stable and negative intracranial MRA.     Past NSAIDS/analgesics:  none Past abortive triptans:  none Past abortive ergotamine:  none Past muscle relaxants:  none Past anti-emetic:  Zofran  ODT 4mg  Past antihypertensive medications:  Metoprolol  succinate, losartan , valsartain,  HCTZ Past antidepressant medications:  Sertraline  25mg , Wellbutrin  Past anticonvulsant medications:  topiramate  50mg  QHS Past anti-CGRP:  none Past vitamins/Herbal/Supplements:  none Past antihistamines/decongestants:  none Other past therapies:  none     Family history of headache or aneurysm:  no  PAST MEDICAL HISTORY: Past Medical History:  Diagnosis Date   Allergic rhinitis, cause unspecified    Allergy     nuts   Asthma    Constipation    Eczema    Food allergy     Gallbladder problem    GERD (gastroesophageal reflux disease)    Headache    Hypertension    Joint pain    Vitamin D  deficiency     MEDICATIONS: Current Outpatient Medications on File Prior to Visit  Medication Sig Dispense Refill   albuterol  (VENTOLIN  HFA) 108 (90 Base) MCG/ACT inhaler Inhale 2 puffs into the lungs every 6 (six) hours as needed for wheezing or shortness of breath. 18 g 1   amLODipine  (NORVASC ) 5 MG tablet TAKE 1 TABLET(5 MG) BY MOUTH DAILY 30 tablet 0   azaTHIOprine  (IMURAN ) 50 MG tablet Take 1 tablet (50 mg total) by mouth daily. 90 tablet 0   azelastine  (ASTELIN ) 0.1 % nasal spray Place 1 spray into both nostrils 2 (two) times daily. Use in each nostril as directed 30 mL 12   BREO ELLIPTA  100-25 MCG/ACT AEPB Inhale 1 puff into the lungs daily. 28 each 0   EPINEPHrine  0.3 mg/0.3 mL IJ SOAJ injection Inject into upper thigh  for severe allergic reaction 2 each 1   fluconazole  (DIFLUCAN ) 150 MG tablet Take 1 tablet (150 mg total) by mouth daily. May repeat in 3 days if needed. 2 tablet 0   fluticasone  (FLONASE ) 50 MCG/ACT nasal spray Place 2 sprays into both nostrils daily. 16 g 6   ibuprofen  (ADVIL ) 800 MG tablet Take 1 tablet (800 mg total) by mouth daily as needed. 90 tablet 0   inFLIXimab -axxq (AVSOLA  IV) Inject into the vein. Infuse 3mg /kg into the vein at Week 0, Week 2, Week 6 then every 8 weeks thereafter     Multiple Vitamin (MULTIVITAMIN PO) Take by mouth.     ondansetron  (ZOFRAN ) 4  MG tablet Take 1 tablet (4 mg total) by mouth daily as needed for nausea or vomiting. 30 tablet 0   pantoprazole  (PROTONIX ) 40 MG tablet Take 1 tablet (40 mg total) by mouth daily. 90 tablet 3   scopolamine  (TRANSDERM-SCOP) 1 MG/3DAYS Place 1 patch (1.5 mg total) onto the skin every 3 (three) days. 10 patch 12   Vitamin D , Ergocalciferol , (DRISDOL ) 1.25 MG (50000 UNIT) CAPS capsule Take 1 capsule (50,000 Units total) by mouth every 7 (seven) days. 12 capsule 0   No current facility-administered medications on file prior to visit.    ALLERGIES: Allergies  Allergen Reactions   Other Anaphylaxis    Nuts   Chlorhexidine Gluconate Itching    FAMILY HISTORY: Family History  Problem Relation Age of Onset   Asthma Mother    Allergies Mother    Asthma Father    Drug abuse Father    Allergies Father       Objective:  *** General: No acute distress.  Patient appears well-groomed.   Head:  Normocephalic/atraumatic Neck:  Supple.  No paraspinal tenderness.  Full range of motion. Heart:  Regular rate and rhythm. Neuro:  Alert and oriented.  Speech fluent and not dysarthric.  Language intact.  CN II-XII intact.  Bulk and tone normal.  Muscle strength 5/5 throughout.  Sensation to light touch intact.  Deep tendon reflexes 2+ throughout, toes downgoing.  Gait normal.  Romberg negative.    Juliene Dunnings, DO  CC: Jamee Antonio Meth, DO ***      "

## 2024-05-07 ENCOUNTER — Ambulatory Visit: Payer: Self-pay | Admitting: Neurology

## 2024-06-11 ENCOUNTER — Ambulatory Visit (INDEPENDENT_AMBULATORY_CARE_PROVIDER_SITE_OTHER): Admitting: Family Medicine

## 2024-06-12 ENCOUNTER — Ambulatory Visit: Admitting: Internal Medicine

## 2024-06-26 ENCOUNTER — Ambulatory Visit
